# Patient Record
Sex: Male | Born: 1960 | Race: White | Hispanic: No | Marital: Single | Smoking: Never smoker
Health system: Southern US, Community
[De-identification: ages and names within clinical notes are randomized; demographics above are authoritative.]

## PROBLEM LIST (undated history)

## (undated) DIAGNOSIS — E669 Obesity, unspecified: Secondary | ICD-10-CM

## (undated) DIAGNOSIS — L509 Urticaria, unspecified: Secondary | ICD-10-CM

## (undated) DIAGNOSIS — M549 Dorsalgia, unspecified: Secondary | ICD-10-CM

## (undated) DIAGNOSIS — G4730 Sleep apnea, unspecified: Secondary | ICD-10-CM

## (undated) DIAGNOSIS — E119 Type 2 diabetes mellitus without complications: Secondary | ICD-10-CM

## (undated) DIAGNOSIS — N481 Balanitis: Secondary | ICD-10-CM

## (undated) DIAGNOSIS — E782 Mixed hyperlipidemia: Secondary | ICD-10-CM

## (undated) DIAGNOSIS — M109 Gout, unspecified: Secondary | ICD-10-CM

## (undated) DIAGNOSIS — I10 Essential (primary) hypertension: Secondary | ICD-10-CM

## (undated) DIAGNOSIS — I83891 Varicose veins of right lower extremities with other complications: Secondary | ICD-10-CM

## (undated) DIAGNOSIS — T7840XA Allergy, unspecified, initial encounter: Secondary | ICD-10-CM

## (undated) DIAGNOSIS — L0390 Cellulitis, unspecified: Secondary | ICD-10-CM

## (undated) DIAGNOSIS — M159 Polyosteoarthritis, unspecified: Secondary | ICD-10-CM

## (undated) DIAGNOSIS — F419 Anxiety disorder, unspecified: Secondary | ICD-10-CM

## (undated) DIAGNOSIS — K2100 Gastro-esophageal reflux disease with esophagitis, without bleeding: Secondary | ICD-10-CM

## (undated) DIAGNOSIS — J45909 Unspecified asthma, uncomplicated: Secondary | ICD-10-CM

## (undated) DIAGNOSIS — J309 Allergic rhinitis, unspecified: Secondary | ICD-10-CM

## (undated) DIAGNOSIS — L900 Lichen sclerosus et atrophicus: Secondary | ICD-10-CM

## (undated) DIAGNOSIS — J449 Chronic obstructive pulmonary disease, unspecified: Secondary | ICD-10-CM

## (undated) DIAGNOSIS — E662 Morbid (severe) obesity with alveolar hypoventilation: Secondary | ICD-10-CM

## (undated) DIAGNOSIS — E1165 Type 2 diabetes mellitus with hyperglycemia: Secondary | ICD-10-CM

## (undated) DIAGNOSIS — G629 Polyneuropathy, unspecified: Secondary | ICD-10-CM

## (undated) HISTORY — DX: Gastro-esophageal reflux disease with esophagitis, without bleeding: K21.00

## (undated) HISTORY — DX: Allergic rhinitis, unspecified: J30.9

## (undated) HISTORY — PX: CHOLECYSTECTOMY: SHX55

## (undated) HISTORY — DX: Allergy, unspecified, initial encounter: T78.40XA

## (undated) HISTORY — DX: Morbid (severe) obesity due to excess calories: E66.01

## (undated) HISTORY — DX: Gout, unspecified: M10.9

## (undated) HISTORY — DX: Polyosteoarthritis, unspecified: M15.9

## (undated) HISTORY — DX: Essential (primary) hypertension: I10

## (undated) HISTORY — DX: Morbid (severe) obesity with alveolar hypoventilation: E66.2

## (undated) HISTORY — DX: Anxiety disorder, unspecified: F41.9

## (undated) HISTORY — DX: Dorsalgia, unspecified: M54.9

## (undated) HISTORY — DX: Type 2 diabetes mellitus with hyperglycemia: E11.65

## (undated) HISTORY — DX: 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: I83.891

## (undated) HISTORY — DX: Sleep apnea, unspecified: G47.30

## (undated) HISTORY — DX: Urticaria, unspecified: L50.9

## (undated) HISTORY — DX: Obesity, unspecified: E66.9

## (undated) HISTORY — DX: Mixed hyperlipidemia: E78.2

---

## 1996-01-04 HISTORY — PX: ERCP: SHX60

## 2010-10-19 ENCOUNTER — Ambulatory Visit: Payer: Medicaid Other | Attending: Pulmonary Disease | Admitting: Sleep Medicine

## 2010-10-19 DIAGNOSIS — Z6841 Body Mass Index (BMI) 40.0 and over, adult: Secondary | ICD-10-CM | POA: Insufficient documentation

## 2010-10-19 DIAGNOSIS — G4730 Sleep apnea, unspecified: Secondary | ICD-10-CM

## 2010-10-19 DIAGNOSIS — G4733 Obstructive sleep apnea (adult) (pediatric): Secondary | ICD-10-CM | POA: Insufficient documentation

## 2010-10-24 NOTE — Procedures (Signed)
NAME:  Aaron Potter, IIAMS              ACCOUNT NO.:  [id number]  MEDICAL RECORD NO.:  [id number]          PATIENT TYPE:  OUT  LOCATION:  SLEEP LAB                     FACILITY:  [facility]  PHYSICIAN:  Farrell Pantaleo A. Gerilyn Pilgrim, M.D. DATE OF BIRTH:  12/14/1960  DATE OF STUDY:  10/19/2010                           NOCTURNAL POLYSOMNOGRAM  REFERRING PHYSICIAN:  Ramon Dredge L. [name], M.D.  INDICATION:  [name] who presents with hypersomnia, fatigue, witnessed apnea and snoring.  This study is being done to evaluate for obstructive sleep apnea syndrome.  MEDICATIONS:  Albuterol, pravastatin, Ventolin, metformin, alprazolam, allopurinol, indomethacin, verapamil and glipizide.  EPWORTH SLEEPINESS SCALE: 1. BMI is 47.  ARCHITECTURAL SUMMARY:  This is a split night recording with initial portion being a diagnostic and the second a titration recording.  The total recording time is 470 minutes.  Sleep efficiency 83%.  Sleep latency 50 minutes. REM latency 303 minutes.  RESPIRATORY SUMMARY:  Baseline oxygen saturation is 94, lowest saturation 81 during non REM sleep.  Diagnostic AHI is 133. The patient was placed on positive pressure between 3 and 15, optimal pressure is 15 with resolution of obstructive events and good tolerance.  LIMB MOVEMENT SUMMARY:  PLM index 0.  ELECTROCARDIOGRAM SUMMARY:  Average heart rate is 71 with no significant dysrhythmias observed.  IMPRESSION:  Severe obstructive sleep apnea syndrome, which responds well to a CPAP of 15.  Thank you for this referral.    Staci Carver A. Gerilyn Pilgrim, M.D.    KAD/MEDQ  D:  10/23/2010 20:43:09  T:  10/24/2010 02:49:31  Job:  [id number]

## 2013-04-06 ENCOUNTER — Encounter (HOSPITAL_COMMUNITY): Payer: Self-pay | Admitting: Emergency Medicine

## 2013-04-06 ENCOUNTER — Inpatient Hospital Stay (HOSPITAL_COMMUNITY)
Admission: EM | Admit: 2013-04-06 | Discharge: 2013-04-12 | DRG: 603 | Disposition: A | Discharge status: Home / Self Care | Payer: Medicaid Other | Attending: Internal Medicine | Admitting: Internal Medicine

## 2013-04-06 DIAGNOSIS — I10 Essential (primary) hypertension: Secondary | ICD-10-CM | POA: Diagnosis present

## 2013-04-06 DIAGNOSIS — L03319 Cellulitis of trunk, unspecified: Principal | ICD-10-CM

## 2013-04-06 DIAGNOSIS — IMO0002 Reserved for concepts with insufficient information to code with codable children: Secondary | ICD-10-CM | POA: Diagnosis present

## 2013-04-06 DIAGNOSIS — Z79899 Other long term (current) drug therapy: Secondary | ICD-10-CM

## 2013-04-06 DIAGNOSIS — Q5564 Hidden penis: Secondary | ICD-10-CM

## 2013-04-06 DIAGNOSIS — E1165 Type 2 diabetes mellitus with hyperglycemia: Secondary | ICD-10-CM

## 2013-04-06 DIAGNOSIS — N480 Leukoplakia of penis: Secondary | ICD-10-CM | POA: Diagnosis present

## 2013-04-06 DIAGNOSIS — L0291 Cutaneous abscess, unspecified: Secondary | ICD-10-CM

## 2013-04-06 DIAGNOSIS — N476 Balanoposthitis: Secondary | ICD-10-CM | POA: Diagnosis present

## 2013-04-06 DIAGNOSIS — L0390 Cellulitis, unspecified: Secondary | ICD-10-CM | POA: Diagnosis present

## 2013-04-06 DIAGNOSIS — L02219 Cutaneous abscess of trunk, unspecified: Principal | ICD-10-CM | POA: Diagnosis present

## 2013-04-06 DIAGNOSIS — M109 Gout, unspecified: Secondary | ICD-10-CM | POA: Diagnosis present

## 2013-04-06 DIAGNOSIS — J4489 Other specified chronic obstructive pulmonary disease: Secondary | ICD-10-CM | POA: Diagnosis present

## 2013-04-06 DIAGNOSIS — N481 Balanitis: Secondary | ICD-10-CM | POA: Diagnosis present

## 2013-04-06 DIAGNOSIS — G4733 Obstructive sleep apnea (adult) (pediatric): Secondary | ICD-10-CM | POA: Diagnosis present

## 2013-04-06 DIAGNOSIS — E1151 Type 2 diabetes mellitus with diabetic peripheral angiopathy without gangrene: Secondary | ICD-10-CM | POA: Diagnosis present

## 2013-04-06 DIAGNOSIS — Z6841 Body Mass Index (BMI) 40.0 and over, adult: Secondary | ICD-10-CM

## 2013-04-06 DIAGNOSIS — Z794 Long term (current) use of insulin: Secondary | ICD-10-CM

## 2013-04-06 DIAGNOSIS — J449 Chronic obstructive pulmonary disease, unspecified: Secondary | ICD-10-CM | POA: Diagnosis present

## 2013-04-06 DIAGNOSIS — E119 Type 2 diabetes mellitus without complications: Secondary | ICD-10-CM | POA: Diagnosis present

## 2013-04-06 HISTORY — DX: Unspecified asthma, uncomplicated: J45.909

## 2013-04-06 HISTORY — DX: Essential (primary) hypertension: I10

## 2013-04-06 HISTORY — DX: Type 2 diabetes mellitus without complications: E11.9

## 2013-04-06 HISTORY — DX: Chronic obstructive pulmonary disease, unspecified: J44.9

## 2013-04-06 HISTORY — DX: Sleep apnea, unspecified: G47.30

## 2013-04-06 HISTORY — DX: Gout, unspecified: M10.9

## 2013-04-06 LAB — BASIC METABOLIC PANEL
BUN: 12 mg/dL (ref 6–23)
CALCIUM: 9.5 mg/dL (ref 8.4–10.5)
CO2: 23 meq/L (ref 19–32)
CREATININE: 0.81 mg/dL (ref 0.50–1.35)
Chloride: 97 mEq/L (ref 96–112)
Glucose, Bld: 217 mg/dL — ABNORMAL HIGH (ref 70–99)
Potassium: 4.2 mEq/L (ref 3.7–5.3)
SODIUM: 136 meq/L — AB (ref 137–147)

## 2013-04-06 LAB — CBC WITH DIFFERENTIAL/PLATELET
BASOS ABS: 0 10*3/uL (ref 0.0–0.1)
BASOS PCT: 0 % (ref 0–1)
EOS ABS: 0.2 10*3/uL (ref 0.0–0.7)
EOS PCT: 3 % (ref 0–5)
HEMATOCRIT: 45.5 % (ref 39.0–52.0)
Hemoglobin: 15.8 g/dL (ref 13.0–17.0)
Lymphocytes Relative: 27 % (ref 12–46)
Lymphs Abs: 2.3 10*3/uL (ref 0.7–4.0)
MCH: 32.1 pg (ref 26.0–34.0)
MCHC: 34.7 g/dL (ref 30.0–36.0)
MCV: 92.5 fL (ref 78.0–100.0)
MONO ABS: 0.8 10*3/uL (ref 0.1–1.0)
Monocytes Relative: 10 % (ref 3–12)
Neutro Abs: 5.1 10*3/uL (ref 1.7–7.7)
Neutrophils Relative %: 60 % (ref 43–77)
PLATELETS: 285 10*3/uL (ref 150–400)
RBC: 4.92 MIL/uL (ref 4.22–5.81)
RDW: 13.7 % (ref 11.5–15.5)
WBC: 8.4 10*3/uL (ref 4.0–10.5)

## 2013-04-06 LAB — URINALYSIS, ROUTINE W REFLEX MICROSCOPIC
Bilirubin Urine: NEGATIVE
Glucose, UA: >1000 mg/dL — AB
Ketones, ur: 15 mg/dL — AB
LEUKOCYTES UA: NEGATIVE
NITRITE: NEGATIVE
PH: 5.5 (ref 5.0–8.0)
PROTEIN: NEGATIVE mg/dL
Specific Gravity, Urine: 1.025 (ref 1.005–1.030)
Urobilinogen, UA: 0.2 mg/dL (ref 0.0–1.0)

## 2013-04-06 LAB — URINE MICROSCOPIC-ADD ON

## 2013-04-06 LAB — HEMOGLOBIN A1C
Hgb A1c MFr Bld: 7.9 % — ABNORMAL HIGH (ref <5.7)
MEAN PLASMA GLUCOSE: 180 mg/dL — AB (ref <117)

## 2013-04-06 LAB — GLUCOSE, CAPILLARY: Glucose-Capillary: 167 mg/dL — ABNORMAL HIGH (ref 70–99)

## 2013-04-06 LAB — CBG MONITORING, ED: Glucose-Capillary: 210 mg/dL — ABNORMAL HIGH (ref 70–99)

## 2013-04-06 MED ORDER — ENOXAPARIN SODIUM 80 MG/0.8ML ~~LOC~~ SOLN
80.0000 mg | SUBCUTANEOUS | Status: DC
  Administered 2013-04-06 – 2013-04-11 (×6): 80 mg via SUBCUTANEOUS
  Filled 2013-04-06 (×7): qty 0.8

## 2013-04-06 MED ORDER — INDOMETHACIN 50 MG PO CAPS
50.0000 mg | ORAL_CAPSULE | Freq: Every day | ORAL | Status: DC
  Administered 2013-04-07 – 2013-04-12 (×6): 50 mg via ORAL
  Filled 2013-04-06 (×2): qty 2
  Filled 2013-04-06: qty 1
  Filled 2013-04-06: qty 2
  Filled 2013-04-06 (×2): qty 1
  Filled 2013-04-06: qty 2

## 2013-04-06 MED ORDER — ALBUTEROL SULFATE HFA 108 (90 BASE) MCG/ACT IN AERS
2.0000 | INHALATION_SPRAY | Freq: Two times a day (BID) | RESPIRATORY_TRACT | Status: DC | PRN
  Filled 2013-04-06: qty 6.7

## 2013-04-06 MED ORDER — ONDANSETRON HCL 4 MG PO TABS: 4.0000 mg | ORAL_TABLET | Freq: Four times a day (QID) | ORAL | Status: DC | PRN

## 2013-04-06 MED ORDER — ALPRAZOLAM 1 MG PO TABS
1.0000 mg | ORAL_TABLET | Freq: Four times a day (QID) | ORAL | Status: DC
  Administered 2013-04-06 – 2013-04-12 (×23): 1 mg via ORAL
  Filled 2013-04-06 (×24): qty 1

## 2013-04-06 MED ORDER — HYDROCODONE-ACETAMINOPHEN 5-325 MG PO TABS
1.0000 | ORAL_TABLET | ORAL | Status: DC | PRN
  Administered 2013-04-06: 1 via ORAL
  Filled 2013-04-06: qty 1
  Filled 2013-04-06: qty 2

## 2013-04-06 MED ORDER — ONDANSETRON HCL 4 MG/2ML IJ SOLN: 4.0000 mg | Freq: Four times a day (QID) | INTRAMUSCULAR | Status: DC | PRN

## 2013-04-06 MED ORDER — INDOMETHACIN 25 MG PO CAPS
50.0000 mg | ORAL_CAPSULE | ORAL | Status: DC
  Filled 2013-04-06: qty 2

## 2013-04-06 MED ORDER — ZOLPIDEM TARTRATE 10 MG PO TABS
10.0000 mg | ORAL_TABLET | Freq: Every evening | ORAL | Status: DC | PRN
  Administered 2013-04-09 – 2013-04-10 (×2): 10 mg via ORAL
  Filled 2013-04-06: qty 1
  Filled 2013-04-06 (×2): qty 2

## 2013-04-06 MED ORDER — BENAZEPRIL HCL 5 MG PO TABS
5.0000 mg | ORAL_TABLET | Freq: Every day | ORAL | Status: DC
  Administered 2013-04-07 – 2013-04-12 (×6): 5 mg via ORAL
  Filled 2013-04-06 (×6): qty 1

## 2013-04-06 MED ORDER — METFORMIN HCL 500 MG PO TABS
1000.0000 mg | ORAL_TABLET | Freq: Two times a day (BID) | ORAL | Status: DC
  Administered 2013-04-06 – 2013-04-12 (×12): 1000 mg via ORAL
  Filled 2013-04-06 (×14): qty 2

## 2013-04-06 MED ORDER — HYDROCODONE-ACETAMINOPHEN 10-325 MG PO TABS
1.0000 | ORAL_TABLET | Freq: Four times a day (QID) | ORAL | Status: DC
Start: 2013-04-06 — End: 2013-04-12
  Administered 2013-04-06 – 2013-04-12 (×23): 1 via ORAL
  Filled 2013-04-06 (×23): qty 1

## 2013-04-06 MED ORDER — VANCOMYCIN HCL 10 G IV SOLR
1500.0000 mg | Freq: Two times a day (BID) | INTRAVENOUS | Status: DC
  Administered 2013-04-07 – 2013-04-11 (×9): 1500 mg via INTRAVENOUS
  Filled 2013-04-06 (×11): qty 1500

## 2013-04-06 MED ORDER — INSULIN ASPART 100 UNIT/ML ~~LOC~~ SOLN
0.0000 [IU] | Freq: Three times a day (TID) | SUBCUTANEOUS | Status: DC
  Administered 2013-04-06: 2 [IU] via SUBCUTANEOUS
  Administered 2013-04-07: 1 [IU] via SUBCUTANEOUS
  Administered 2013-04-07: 2 [IU] via SUBCUTANEOUS
  Administered 2013-04-07 – 2013-04-08 (×2): 3 [IU] via SUBCUTANEOUS
  Administered 2013-04-08: 2 [IU] via SUBCUTANEOUS
  Administered 2013-04-08: 1 [IU] via SUBCUTANEOUS

## 2013-04-06 MED ORDER — ENOXAPARIN SODIUM 40 MG/0.4ML ~~LOC~~ SOLN
40.0000 mg | SUBCUTANEOUS | Status: DC
  Filled 2013-04-06: qty 0.4

## 2013-04-06 MED ORDER — ADULT MULTIVITAMIN W/MINERALS CH
1.0000 | ORAL_TABLET | Freq: Every day | ORAL | Status: DC
  Administered 2013-04-07 – 2013-04-12 (×6): 1 via ORAL
  Filled 2013-04-06 (×7): qty 1

## 2013-04-06 MED ORDER — VERAPAMIL HCL ER 240 MG PO TBCR
240.0000 mg | EXTENDED_RELEASE_TABLET | Freq: Every day | ORAL | Status: DC
  Administered 2013-04-07 – 2013-04-12 (×6): 240 mg via ORAL
  Filled 2013-04-06 (×6): qty 1

## 2013-04-06 MED ORDER — ALLOPURINOL 300 MG PO TABS
300.0000 mg | ORAL_TABLET | Freq: Every day | ORAL | Status: DC
  Administered 2013-04-07 – 2013-04-12 (×6): 300 mg via ORAL
  Filled 2013-04-06 (×6): qty 1

## 2013-04-06 MED ORDER — INSULIN GLARGINE 100 UNIT/ML ~~LOC~~ SOLN
70.0000 [IU] | Freq: Every day | SUBCUTANEOUS | Status: DC
  Administered 2013-04-06 – 2013-04-11 (×6): 70 [IU] via SUBCUTANEOUS
  Filled 2013-04-06 (×8): qty 0.7

## 2013-04-06 MED ORDER — INSULIN ASPART 100 UNIT/ML ~~LOC~~ SOLN
3.0000 [IU] | Freq: Three times a day (TID) | SUBCUTANEOUS | Status: DC
  Administered 2013-04-06 – 2013-04-08 (×7): 3 [IU] via SUBCUTANEOUS

## 2013-04-06 MED ORDER — MORPHINE SULFATE 2 MG/ML IJ SOLN
1.0000 mg | INTRAMUSCULAR | Status: DC | PRN
  Administered 2013-04-07 – 2013-04-09 (×3): 1 mg via INTRAVENOUS
  Filled 2013-04-06 (×3): qty 1

## 2013-04-06 MED ORDER — SIMVASTATIN 10 MG PO TABS
5.0000 mg | ORAL_TABLET | Freq: Every day | ORAL | Status: DC
  Administered 2013-04-06 – 2013-04-11 (×6): 5 mg via ORAL
  Filled 2013-04-06 (×2): qty 1
  Filled 2013-04-06 (×2): qty 0.5
  Filled 2013-04-06 (×4): qty 1
  Filled 2013-04-06: qty 0.5

## 2013-04-06 MED ORDER — INSULIN ASPART 100 UNIT/ML ~~LOC~~ SOLN: 0.0000 [IU] | Freq: Every day | SUBCUTANEOUS | Status: DC

## 2013-04-06 MED ORDER — ALBUTEROL SULFATE (2.5 MG/3ML) 0.083% IN NEBU
2.5000 mg | INHALATION_SOLUTION | Freq: Four times a day (QID) | RESPIRATORY_TRACT | Status: DC | PRN
  Administered 2013-04-08 – 2013-04-11 (×4): 2.5 mg via RESPIRATORY_TRACT
  Filled 2013-04-06 (×4): qty 3

## 2013-04-06 MED ORDER — PIPERACILLIN-TAZOBACTAM 3.375 G IVPB
3.3750 g | Freq: Three times a day (TID) | INTRAVENOUS | Status: DC
  Administered 2013-04-06 – 2013-04-09 (×10): 3.375 g via INTRAVENOUS
  Filled 2013-04-06 (×14): qty 50

## 2013-04-06 MED ORDER — TIOTROPIUM BROMIDE MONOHYDRATE 18 MCG IN CAPS
18.0000 ug | ORAL_CAPSULE | Freq: Every day | RESPIRATORY_TRACT | Status: DC
  Administered 2013-04-06 – 2013-04-12 (×5): 18 ug via RESPIRATORY_TRACT
  Filled 2013-04-06 (×2): qty 5

## 2013-04-06 MED ORDER — INSULIN ASPART 100 UNIT/ML FLEXPEN: 10.0000 [IU] | PEN_INJECTOR | Freq: Three times a day (TID) | SUBCUTANEOUS | Status: DC

## 2013-04-06 MED ORDER — LINAGLIPTIN 5 MG PO TABS
5.0000 mg | ORAL_TABLET | Freq: Every day | ORAL | Status: DC
  Administered 2013-04-07 – 2013-04-12 (×6): 5 mg via ORAL
  Filled 2013-04-06 (×6): qty 1

## 2013-04-06 MED ORDER — VANCOMYCIN HCL 10 G IV SOLR
2000.0000 mg | Freq: Once | INTRAVENOUS | Status: AC
  Administered 2013-04-06: 2000 mg via INTRAVENOUS
  Filled 2013-04-06: qty 2000

## 2013-04-06 NOTE — ED Notes (Signed)
Dr Pickering at bedside,  

## 2013-04-06 NOTE — ED Notes (Signed)
Pt sitting up on side of bed, states "I am comfortable in this position", pt updated on plan of care and admission to hospital

## 2013-04-06 NOTE — ED Notes (Addendum)
Pt c/o pain with sitting on stretcher, offer of pain medication made to pt with pt refusing pain medication at present time, RN made several offer of pain medication, each time pt refusing, states "I am hurting from sitting on stretcher" pt repositioned, comfort measures provided,

## 2013-04-06 NOTE — ED Notes (Signed)
Pt c/o rash, swelling, pain to penis area that started about a week ago, was seen by Dr Adah PerlHawkin's on Wednesday, given pain medication, and antibiotics, pt states that he is not any better, skin around penis, red, warm to touch, swollen, skin in pubic ramus area red, warm to touch, pt has yellow drainage noted from penis area. States that he is unable to pull the foreskin back to see the penis,

## 2013-04-06 NOTE — ED Provider Notes (Signed)
CSN: [id number]     Arrival date & time 04/06/13  4098 History  This chart was scribed for non-physician practitioner working with Dorothea Ogle, MD by Ashley Jacobs, ED scribe. This patient was seen in room [facility]08/[facility]08 and the patient's care was started at 9:02 AM.    First MD Initiated Contact with Patient 04/06/13 [phone]     Chief Complaint  Patient presents with  . Rash     (Consider location/radiation/quality/duration/timing/severity/associated sxs/prior Treatment) Patient is a 53 y.o. male presenting with rash. The history is provided by the patient and medical records. No language interpreter was used.  Rash Associated symptoms: wheezing   Associated symptoms: no fever    HPI Comments: Aaron Potter is a 53 y.o. male who presents to the Emergency Department complaining of rash to his foreskin onset one week ago. He mentions that his symptoms remains unresolved and persistent. Nothing seems to resolve his symptoms.  Pt is uncircumcised. He reports having burning with urination and having difficulty retracting the foreskin due to swelling. He experiences mild relief when showering. Pt has the associated symptom of diaphoresis and chills. He mentions that his glucose levels are regular. Pt did not eat this morning. He has a hx of COPD and reports that the wheezing is baseline for him.  Pt has allergies to Biaxin which causes anaphylaxis.   Past Medical History  Diagnosis Date  . Diabetes mellitus without complication   . Asthma   . COPD (chronic obstructive pulmonary disease)   . Hypertension   . Gout    Past Surgical History  Procedure Laterality Date  . Cholecystectomy     History reviewed. No pertinent family history. History  Substance Use Topics  . Smoking status: Never Smoker   . Smokeless tobacco: Not on file  . Alcohol Use: No    Review of Systems  Constitutional: Positive for chills and diaphoresis. Negative for fever.  Respiratory: Positive for wheezing.    Genitourinary: Positive for dysuria, penile swelling and penile pain. Negative for discharge.  Skin: Positive for rash.  All other systems reviewed and are negative.      Allergies  Biaxin  Home Medications   Current Outpatient Rx  Name  Route  Sig  Dispense  Refill  . albuterol (PROVENTIL HFA;VENTOLIN HFA) 108 (90 BASE) MCG/ACT inhaler   Inhalation   Inhale 2 puffs into the lungs 2 (two) times daily as needed for wheezing or shortness of breath.         Marland Kitchen albuterol (PROVENTIL) (2.5 MG/3ML) 0.083% nebulizer solution   Nebulization   Take 2.5 mg by nebulization every 6 (six) hours as needed for wheezing or shortness of breath.          . allopurinol (ZYLOPRIM) 300 MG tablet   Oral   Take 300 mg by mouth daily.           Marland Kitchen ALPRAZolam (XANAX) 1 MG tablet   Oral   Take 1 mg by mouth 4 (four) times daily.           . benazepril (LOTENSIN) 5 MG tablet   Oral   Take 5 mg by mouth daily.         . cephALEXin (KEFLEX) 250 MG capsule   Oral   Take 500 mg by mouth 3 (three) times daily.         Marland Kitchen HYDROcodone-acetaminophen (NORCO) 10-325 MG per tablet   Oral   Take 1 tablet by mouth 4 (four) times daily.         Marland Kitchen  indomethacin (INDOCIN) 50 MG capsule   Oral   Take 50 mg by mouth 1 day or 1 dose.           . insulin aspart (NOVOLOG FLEXPEN) 100 UNIT/ML FlexPen   Subcutaneous   Inject 10-14 Units into the skin 3 (three) times daily with meals.         . insulin glargine (LANTUS) 100 UNIT/ML injection   Subcutaneous   Inject 70 Units into the skin at bedtime.         . Liraglutide (VICTOZA) 18 MG/3ML SOPN   Subcutaneous   Inject 1.8 Units into the skin daily.         . metFORMIN (GLUCOPHAGE) 500 MG tablet   Oral   Take 1,000 mg by mouth 2 (two) times daily with a meal.          . Multiple Vitamin (MULTIVITAMIN WITH MINERALS) TABS tablet   Oral   Take 1 tablet by mouth daily.         . pravastatin (PRAVACHOL) 40 MG tablet   Oral   Take  40 mg by mouth daily.         . saxagliptin HCl (ONGLYZA) 5 MG TABS tablet   Oral   Take 5 mg by mouth daily.         Marland Kitchen tiotropium (SPIRIVA) 18 MCG inhalation capsule   Inhalation   Place 18 mcg into inhaler and inhale daily.         . verapamil (CALAN-SR) 240 MG CR tablet   Oral   Take 240 mg by mouth daily.         . Vitamin D, Ergocalciferol, (DRISDOL) 50000 UNITS CAPS capsule   Oral   Take 50,000 Units by mouth every 7 (seven) days. On Tuesday's.         . zolpidem (AMBIEN) 10 MG tablet   Oral   Take 10 mg by mouth at bedtime as needed for sleep.           BP 140/70  Pulse 79  Temp(Src) 97.8 F (36.6 C) (Oral)  Resp 22  Ht 6' (1.829 m)  Wt 401 lb (181.892 kg)  BMI 54.37 kg/m2  SpO2 96% Physical Exam  Nursing note and vitals reviewed. Constitutional: He is oriented to person, place, and time. He appears well-developed. No distress.  Obese   HENT:  Head: Normocephalic and atraumatic.  Eyes: EOM are normal. Pupils are equal, round, and reactive to light.  Neck: Normal range of motion. Neck supple. No tracheal deviation present.  Cardiovascular: Normal rate.   Pulmonary/Chest: Tachypnea noted. No respiratory distress. He has wheezes (mild diffuse).  Abdominal: Soft. He exhibits no distension. There is no tenderness.  Genitourinary: Uncircumcised.   uncircumsized Diffuse edema to the foreskin  No purulent drainage Not able to visualize penis No surrounding skin changes to the abdomen or thig  Musculoskeletal: Normal range of motion. He exhibits no edema.  No peripheral edema   Neurological: He is alert and oriented to person, place, and time.  Skin: Skin is warm and dry.  Psychiatric: He has a normal mood and affect. His behavior is normal.    ED Course  Procedures (including critical care time) DIAGNOSTIC STUDIES: Oxygen Saturation is 96% on room air, normal by my interpretation.    COORDINATION OF CARE:  9:05 AM Discussed course of care  with pt which includes IV medication and laboratory tests. Pt understands and agrees.    Labs Review Labs Reviewed  BASIC METABOLIC PANEL - Abnormal; Notable for the following:    Sodium 136 (*)    Glucose, Bld 217 (*)    All other components within normal limits  CBG MONITORING, ED - Abnormal; Notable for the following:    Glucose-Capillary 210 (*)    All other components within normal limits  WOUND CULTURE  CBC WITH DIFFERENTIAL  CBG MONITORING, ED   Imaging Review No results found.   EKG Interpretation None      MDM   Final diagnoses:  Cellulitis    Patient with foreskin cellulitis. No clear abscess. Has been on antibiotics as an outpatient for 3 days. Worsening of symptoms. Will admit for inpatient management due to multiple comorbidities and failure of outpatient antibiotics.   I personally performed the services described in this documentation, which was scribed in my presence. The recorded information has been reviewed and is accurate.     Juliet RudeNathan R. Rubin PayorPickering, MD 04/06/13 1059

## 2013-04-06 NOTE — ED Notes (Signed)
Pt c/o rash-like area to penis. Pt noticed area one week ago. Pt reports pain in area. Pt was seen by Dr Juanetta GoslingHawkins on Wednesday and "had a shot" and was given abx rx for home. Pt states symptoms are not improving with medication.

## 2013-04-06 NOTE — ED Notes (Signed)
Pt updated on plan of care, denies needing anything for pain,

## 2013-04-06 NOTE — H&P (Signed)
[facility] History and Physical  Aaron Potter ZOX:[id number] DOB: 04/23/1960 DOA: 04/06/2013  Referring physician: ED physician PCP: Fredirick Maudlin, MD   Chief Complaint: foreskin rash   HPI:  Pt is 53 yo male with HTN, DM, COPD, presented to [facility] ED with main concern of several days duration of progressively worsening foreskin rash. He reports associated burning with urination and difficulty retracting foreskin, progressive swelling and redness, subjective fevers, chills. He has seen his PCP several days ago and was given ABX shot, pt does not know the name of the ABX. His symptoms have not improved and he came to ED. He denies chest pain, shortness of breath, no specific abdominal concerns. He also explains his sugar levels have been fairly well controlled on current regimen.  Assessment and Plan: Active Problems: Foreskin infection - will admit pt to medical floor and will place on slightly broader spectrum for now, given the fact that pt is diabetic and has not responded as well to oral ABX - place on Vanc and Maxipime for now and will likely be able to transition to oral ABX in AM and possible d/c depending on how pt is doing  - provide analgesia as needed - check UA - will notify PCP Dr. [name] of pt's admission DM - continue home medical regimen - pt can eat regular diet as able to tolerate  HTN - reasonably stable in admission - continue home medical regimen COPD - clinically compensated at this time - pt is maintaining oxygen saturation at target range   Radiological Exams on Admission: No results found.  Code Status: Full Family Communication: Pt at bedside Disposition Plan: Admit for further evaluation     Review of Systems:  Constitutional: Negative for diaphoresis.  HENT: Negative for hearing loss, ear pain, nosebleeds, congestion, sore throat, neck pain, tinnitus and ear discharge.   Eyes: Negative for blurred vision, double vision, photophobia,  pain, discharge and redness.  Respiratory: Negative for cough, hemoptysis, sputum production, shortness of breath, wheezing and stridor.   Cardiovascular: Negative for chest pain, palpitations, orthopnea, claudication and leg swelling.  Gastrointestinal: Negative for heartburn, constipation, blood in stool and melena.  Genitourinary: Negative for hematuria and flank pain.  Musculoskeletal: Negative for myalgias, back pain, joint pain and falls.  Skin: Per HPI Neurological: Negative for tingling, tremors, sensory change, speech change, focal weakness, loss of consciousness and headaches.  Endo/Heme/Allergies: Negative for environmental allergies and polydipsia. Does not bruise/bleed easily.  Psychiatric/Behavioral: Negative for suicidal ideas. The patient is not nervous/anxious.      Past Medical History  Diagnosis Date  . Diabetes mellitus without complication   . Asthma   . COPD (chronic obstructive pulmonary disease)   . Hypertension   . Gout     Past Surgical History  Procedure Laterality Date  . Cholecystectomy      Social History:  reports that he has never smoked. He does not have any smokeless tobacco history on file. He reports that he does not drink alcohol or use illicit drugs.  Allergies  Allergen Reactions  . Biaxin [Clarithromycin] Shortness Of Breath    No known significant family medical history   Prior to Admission medications   Medication Sig Start Date End Date Taking? Authorizing Provider  albuterol (PROVENTIL HFA;VENTOLIN HFA) 108 (90 BASE) MCG/ACT inhaler Inhale 2 puffs into the lungs 2 (two) times daily as needed for wheezing or shortness of breath.   Yes Historical Provider, MD  albuterol (PROVENTIL) (2.5 MG/3ML) 0.083% nebulizer solution Take  2.5 mg by nebulization every 6 (six) hours as needed for wheezing or shortness of breath.    Yes Historical Provider, MD  allopurinol (ZYLOPRIM) 300 MG tablet Take 300 mg by mouth daily.     Yes Historical Provider,  MD  ALPRAZolam Prudy Feeler(XANAX) 1 MG tablet Take 1 mg by mouth 4 (four) times daily.     Yes Historical Provider, MD  benazepril (LOTENSIN) 5 MG tablet Take 5 mg by mouth daily.   Yes Historical Provider, MD  cephALEXin (KEFLEX) 250 MG capsule Take 500 mg by mouth 3 (three) times daily.   Yes Historical Provider, MD  HYDROcodone-acetaminophen (NORCO) 10-325 MG per tablet Take 1 tablet by mouth 4 (four) times daily.   Yes Historical Provider, MD  indomethacin (INDOCIN) 50 MG capsule Take 50 mg by mouth 1 day or 1 dose.     Yes Historical Provider, MD  insulin aspart (NOVOLOG FLEXPEN) 100 UNIT/ML FlexPen Inject 10-14 Units into the skin 3 (three) times daily with meals.   Yes Historical Provider, MD  insulin glargine (LANTUS) 100 UNIT/ML injection Inject 70 Units into the skin at bedtime.   Yes Historical Provider, MD  Liraglutide (VICTOZA) 18 MG/3ML SOPN Inject 1.8 Units into the skin daily.   Yes Historical Provider, MD  metFORMIN (GLUCOPHAGE) 500 MG tablet Take 1,000 mg by mouth 2 (two) times daily with a meal.    Yes Historical Provider, MD  Multiple Vitamin (MULTIVITAMIN WITH MINERALS) TABS tablet Take 1 tablet by mouth daily.   Yes Historical Provider, MD  pravastatin (PRAVACHOL) 40 MG tablet Take 40 mg by mouth daily.   Yes Historical Provider, MD  saxagliptin HCl (ONGLYZA) 5 MG TABS tablet Take 5 mg by mouth daily.   Yes Historical Provider, MD  tiotropium (SPIRIVA) 18 MCG inhalation capsule Place 18 mcg into inhaler and inhale daily.   Yes Historical Provider, MD  verapamil (CALAN-SR) 240 MG CR tablet Take 240 mg by mouth daily.   Yes Historical Provider, MD  Vitamin D, Ergocalciferol, (DRISDOL) 50000 UNITS CAPS capsule Take 50,000 Units by mouth every 7 (seven) days. On Tuesday's.   Yes Historical Provider, MD  zolpidem (AMBIEN) 10 MG tablet Take 10 mg by mouth at bedtime as needed for sleep.    Yes Historical Provider, MD    Physical Exam: Filed Vitals:   04/06/13 0839  BP: 153/79  Pulse: 91   Temp: 97.8 F (36.6 C)  TempSrc: Oral  Resp: 22  Height: 6' (1.829 m)  Weight: 181.892 kg (401 lb)  SpO2: 96%    Physical Exam  Constitutional: Appears well-developed and well-nourished. No distress.  HENT: Normocephalic. External right and left ear normal. Oropharynx is clear and moist.  Eyes: Conjunctivae and EOM are normal. PERRLA, no scleral icterus.  Neck: Normal ROM. Neck supple. No JVD. No tracheal deviation. No thyromegaly.  CVS: RRR, S1/S2 +, no murmurs, no gallops, no carotid bruit.  Pulmonary: Effort and breath sounds normal, no stridor, rhonchi, wheezes, rales.  Abdominal: Soft. BS +,  no distension, tenderness, rebound or guarding.  Musculoskeletal: Normal range of motion. No edema and no tenderness.  Lymphadenopathy: No lymphadenopathy noted, cervical, inguinal. Neuro: Alert. Normal reflexes, muscle tone coordination. No cranial nerve deficit. Skin: skin around penis with erythema and warm to touch, swollen, skin in pubic ramus erythematous and also warm to touch, with some yellow drainage noted from penis Psychiatric: Normal mood and affect. Behavior, judgment, thought content normal.   Labs on Admission:  Basic Metabolic Panel:  Recent  Labs Lab 04/06/13 0919  NA 136*  K 4.2  CL 97  CO2 23  GLUCOSE 217*  BUN 12  CREATININE 0.81  CALCIUM 9.5   CBC:  Recent Labs Lab 04/06/13 0919  WBC 8.4  NEUTROABS 5.1  HGB 15.8  HCT 45.5  MCV 92.5  PLT 285   CBG:  Recent Labs Lab 04/06/13 0843  GLUCAP 210*    EKG: Normal sinus rhythm, no ST/T wave changes  Debbora Presto, MD  [facility] Pager [phone]  If 7PM-7AM, please contact night-coverage [email or web address] Password [facility] 04/06/2013, 10:49 AM

## 2013-04-06 NOTE — Progress Notes (Signed)
ANTIBIOTIC CONSULT NOTE - INITIAL  Pharmacy Consult for Vancomycin & Zosyn Indication: cellulitis  Allergies  Allergen Reactions  . Biaxin [Clarithromycin] Shortness Of Breath    Patient Measurements: Height: 6' (182.9 cm) Weight: 401 lb 3.2 oz (181.983 kg) IBW/kg (Calculated) : 77.6  Vital Signs: Temp: 97.3 F (36.3 C) (04/04 1303) Temp src: Oral (04/04 1303) BP: 125/62 mmHg (04/04 1303) Pulse Rate: 79 (04/04 1303) Intake/Output from previous day:   Intake/Output from this shift:    Labs:  Recent Labs  04/06/13 0919  WBC 8.4  HGB 15.8  PLT 285  CREATININE 0.81   Estimated Creatinine Clearance: 184.3 ml/min (by C-G formula based on Cr of 0.81). No results found for this basename: VANCOTROUGH, VANCOPEAK, VANCORANDOM, GENTTROUGH, GENTPEAK, GENTRANDOM, TOBRATROUGH, TOBRAPEAK, TOBRARND, AMIKACINPEAK, AMIKACINTROU, AMIKACIN,  in the last 72 hours   Microbiology: No results found for this or any previous visit (from the past 720 hour(s)).  Medical History: Past Medical History  Diagnosis Date  . Diabetes mellitus without complication   . Asthma   . COPD (chronic obstructive pulmonary disease)   . Hypertension   . Gout   . Sleep apnea     Medications:  Scheduled:  . [START ON 04/07/2013] allopurinol  300 mg Oral Daily  . ALPRAZolam  1 mg Oral QID  . [START ON 04/07/2013] benazepril  5 mg Oral Daily  . enoxaparin (LOVENOX) injection  80 mg Subcutaneous Q24H  . HYDROcodone-acetaminophen  1 tablet Oral QID  . [START ON 04/07/2013] indomethacin  50 mg Oral Q breakfast  . insulin aspart  0-5 Units Subcutaneous QHS  . insulin aspart  0-9 Units Subcutaneous TID WC  . insulin aspart  3 Units Subcutaneous TID WC  . insulin glargine  70 Units Subcutaneous QHS  . [START ON 04/07/2013] linagliptin  5 mg Oral Daily  . metFORMIN  1,000 mg Oral BID WC  . multivitamin with minerals  1 tablet Oral Daily  . piperacillin-tazobactam (ZOSYN)  IV  3.375 g Intravenous Q8H  .  simvastatin  5 mg Oral q1800  . tiotropium  18 mcg Inhalation Daily  . vancomycin  2,000 mg Intravenous Once  . [START ON 04/07/2013] verapamil  240 mg Oral Daily   Assessment: 53 yo obese M who presents with rash & swelling of penis region.  There is drainage per notes.  He has been on cephalexin as an outpatient with no improvement.   Renal function is at patient's baseline.   Vancomycin 4/4>> Zosyn 4/4>>  Goal of Therapy:  Vancomycin trough level 10-15 mcg/ml  Plan:  Zosyn 3.375gm IV Q8h to be infused over 4hrs Vancomycin 2gm IV x1 loading dose then 1500mg  IV q12h Check Vancomycin trough at steady state Monitor renal function and cx data   Elson ClanLilliston, Katryn Plummer Michelle 04/06/2013,1:44 PM

## 2013-04-06 NOTE — ED Notes (Signed)
Report given to Maralyn SagoSarah, RN on 300, pharmacy notified of vancomycin order,

## 2013-04-06 NOTE — ED Notes (Signed)
Dr Izola PriceMyers (hospitalist) at bedside,

## 2013-04-06 NOTE — Progress Notes (Signed)
Pt transferred to 300 from ED. Pt is in NAD and VS are stable. Will continue to monitor.

## 2013-04-07 DIAGNOSIS — Z794 Long term (current) use of insulin: Secondary | ICD-10-CM

## 2013-04-07 DIAGNOSIS — E1165 Type 2 diabetes mellitus with hyperglycemia: Secondary | ICD-10-CM

## 2013-04-07 DIAGNOSIS — I10 Essential (primary) hypertension: Secondary | ICD-10-CM | POA: Diagnosis present

## 2013-04-07 DIAGNOSIS — E1151 Type 2 diabetes mellitus with diabetic peripheral angiopathy without gangrene: Secondary | ICD-10-CM | POA: Diagnosis present

## 2013-04-07 DIAGNOSIS — IMO0002 Reserved for concepts with insufficient information to code with codable children: Secondary | ICD-10-CM | POA: Diagnosis present

## 2013-04-07 LAB — CBC
HCT: 42.7 % (ref 39.0–52.0)
Hemoglobin: 14.1 g/dL (ref 13.0–17.0)
MCH: 30.9 pg (ref 26.0–34.0)
MCHC: 33 g/dL (ref 30.0–36.0)
MCV: 93.6 fL (ref 78.0–100.0)
Platelets: 289 10*3/uL (ref 150–400)
RBC: 4.56 MIL/uL (ref 4.22–5.81)
RDW: 13.9 % (ref 11.5–15.5)
WBC: 7.4 10*3/uL (ref 4.0–10.5)

## 2013-04-07 LAB — BASIC METABOLIC PANEL
BUN: 12 mg/dL (ref 6–23)
CALCIUM: 8.8 mg/dL (ref 8.4–10.5)
CHLORIDE: 99 meq/L (ref 96–112)
CO2: 27 meq/L (ref 19–32)
Creatinine, Ser: 0.84 mg/dL (ref 0.50–1.35)
GFR calc non Af Amer: >90 mL/min (ref >90)
Glucose, Bld: 196 mg/dL — ABNORMAL HIGH (ref 70–99)
Potassium: 4.6 mEq/L (ref 3.7–5.3)
SODIUM: 136 meq/L — AB (ref 137–147)

## 2013-04-07 LAB — GLUCOSE, CAPILLARY
Glucose-Capillary: 152 mg/dL — ABNORMAL HIGH (ref 70–99)
Glucose-Capillary: 225 mg/dL — ABNORMAL HIGH (ref 70–99)
Glucose-Capillary: 165 mg/dL — ABNORMAL HIGH (ref 70–99)
Glucose-Capillary: 169 mg/dL — ABNORMAL HIGH (ref 70–99)

## 2013-04-07 MED ORDER — NYSTATIN-TRIAMCINOLONE 100000-0.1 UNIT/GM-% EX CREA
TOPICAL_CREAM | Freq: Three times a day (TID) | CUTANEOUS | Status: DC
  Administered 2013-04-07 – 2013-04-11 (×10): via TOPICAL
  Filled 2013-04-07 (×3): qty 15

## 2013-04-07 NOTE — Consult Note (Signed)
Note dictated [phone]#[id number]

## 2013-04-07 NOTE — Progress Notes (Signed)
Found a partially used syringe of dilaudid in pt's drawer in med room. Called pharmacy and [facility] the remaining [facility] 0.6mg  was wasted of a 1mg /411ml dilaudid syringe. Will continue to monitor.

## 2013-04-07 NOTE — Progress Notes (Signed)
Bladder scanned pt per MD order. Pt had 16mL in urine and has no discomfort or difficulty starting stream at this time. Will continue to monitor.

## 2013-04-08 ENCOUNTER — Encounter (HOSPITAL_COMMUNITY): Payer: Self-pay | Admitting: Anesthesiology

## 2013-04-08 LAB — GLUCOSE, CAPILLARY
GLUCOSE-CAPILLARY: 183 mg/dL — AB (ref 70–99)
GLUCOSE-CAPILLARY: 197 mg/dL — AB (ref 70–99)
GLUCOSE-CAPILLARY: 222 mg/dL — AB (ref 70–99)
GLUCOSE-CAPILLARY: 200 mg/dL — AB (ref 70–99)
Glucose-Capillary: 149 mg/dL — ABNORMAL HIGH (ref 70–99)

## 2013-04-08 LAB — BASIC METABOLIC PANEL
BUN: 11 mg/dL (ref 6–23)
CHLORIDE: 102 meq/L (ref 96–112)
CO2: 27 mEq/L (ref 19–32)
Calcium: 8.8 mg/dL (ref 8.4–10.5)
Creatinine, Ser: 0.83 mg/dL (ref 0.50–1.35)
GFR calc Af Amer: >90 mL/min (ref >90)
GFR calc non Af Amer: >90 mL/min (ref >90)
GLUCOSE: 228 mg/dL — AB (ref 70–99)
Potassium: 4.1 mEq/L (ref 3.7–5.3)
Sodium: 141 mEq/L (ref 137–147)

## 2013-04-08 MED ORDER — PIPERACILLIN-TAZOBACTAM 3.375 G IVPB
INTRAVENOUS | Status: AC
  Filled 2013-04-08: qty 100

## 2013-04-08 NOTE — Progress Notes (Signed)
NAME:  Forestine ChuteBELCHER, Dagon              ACCOUNT NO.:  [id number]  MEDICAL RECORD NO.:  [id number]  LOCATION:  A302                          FACILITY:  [facility]  PHYSICIAN:  Helmi Hechavarria L. Juanetta GoslingHawkins, M.D.DATE OF BIRTH:  1960-03-29  DATE OF PROCEDURE: DATE OF DISCHARGE:                                PROGRESS NOTE   Aaron Potter is in the hospital now with cellulitis in his groin area. He says he feels somewhat better but still having a lot of discomfort. He has no other new complaints.  PHYSICAL EXAMINATION:  GENERAL:  He is morbidly obese. VITAL SIGNS:  Temperature 97.9, pulse 91, respirations 22, blood pressure 153/79, O2 sats 97%. CHEST:  Minimal wheeze. HEART:  Regular without gallop. ABDOMEN:  Soft, obese without masses.  He has edema of the scrotum and foreskin of his penis and he has some erythema on the pubic ramus skin area.  ASSESSMENT:  He has significant problems with what I think cellulitis in his groin.  He has morbid obesity, chronic obstructive pulmonary disease.  He is diabetic which courses makes it is more concerning.  He has hypertension which is pretty well controlled.  My plan is to continue with his current medications and treatments and follow.     Tenya Araque L. Juanetta GoslingHawkins, M.D.     ELH/MEDQ  D:  04/07/2013  T:  04/08/2013  Job:  [id number]

## 2013-04-08 NOTE — Progress Notes (Signed)
Subjective: He says he feels better. He has no new complaints. He is improved as far as his cellulitis is concerned.  Objective: Vital signs in last 24 hours: Temp:  [97.4 F (36.3 C)-97.9 F (36.6 C)] 97.4 F (36.3 C) (04/06 0526) Pulse Rate:  [74-82] 74 (04/06 0526) Resp:  [20] 20 (04/06 0526) BP: (149-150)/(63-75) 149/63 mmHg (04/06 0526) SpO2:  [95 %-97 %] 97 % (04/06 0651) Weight change:  Last BM Date: 04/07/13  Intake/Output from previous day: 04/05 0701 - 04/06 0700 In: 1580 [P.O.:480; IV Piggyback:1100] Out: 1800 [Urine:1800]  PHYSICAL EXAM General appearance: alert, cooperative, mild distress and morbidly obese Resp: clear to auscultation bilaterally Cardio: regular rate and rhythm, S1, S2 normal, no murmur, click, rub or gallop GI: The changes of cellulitis in the area of his suprapubic area are much better. Extremities: extremities normal, atraumatic, no cyanosis or edema  Lab Results:    Basic Metabolic Panel:  Recent Labs  16/10/96 0532 04/08/13 0454  NA 136* 141  K 4.6 4.1  CL 99 102  CO2 27 27  GLUCOSE 196* 228*  BUN 12 11  CREATININE 0.84 0.83  CALCIUM 8.8 8.8   Liver Function Tests: No results found for this basename: AST, ALT, ALKPHOS, BILITOT, PROT, ALBUMIN,  in the last 72 hours No results found for this basename: LIPASE, AMYLASE,  in the last 72 hours No results found for this basename: AMMONIA,  in the last 72 hours CBC:  Recent Labs  04/06/13 0919 04/07/13 0532  WBC 8.4 7.4  NEUTROABS 5.1  --   HGB 15.8 14.1  HCT 45.5 42.7  MCV 92.5 93.6  PLT 285 289   Cardiac Enzymes: No results found for this basename: CKTOTAL, CKMB, CKMBINDEX, TROPONINI,  in the last 72 hours BNP: No results found for this basename: PROBNP,  in the last 72 hours D-Dimer: No results found for this basename: DDIMER,  in the last 72 hours CBG:  Recent Labs  04/06/13 2055 04/07/13 0723 04/07/13 1129 04/07/13 1601 04/07/13 2136 04/08/13 0708  GLUCAP  152* 225* 165* 169* 183* 197*   Hemoglobin A1C:  Recent Labs  04/06/13 0919  HGBA1C 7.9*   Fasting Lipid Panel: No results found for this basename: CHOL, HDL, LDLCALC, TRIG, CHOLHDL, LDLDIRECT,  in the last 72 hours Thyroid Function Tests: No results found for this basename: TSH, T4TOTAL, FREET4, T3FREE, THYROIDAB,  in the last 72 hours Anemia Panel: No results found for this basename: VITAMINB12, FOLATE, FERRITIN, TIBC, IRON, RETICCTPCT,  in the last 72 hours Coagulation: No results found for this basename: LABPROT, INR,  in the last 72 hours Urine Drug Screen: Drugs of Abuse  No results found for this basename: labopia, cocainscrnur, labbenz, amphetmu, thcu, labbarb    Alcohol Level: No results found for this basename: ETH,  in the last 72 hours Urinalysis:  Recent Labs  04/06/13 1854  COLORURINE YELLOW  LABSPEC 1.025  PHURINE 5.5  GLUCOSEU >1000*  HGBUR TRACE*  BILIRUBINUR NEGATIVE  KETONESUR 15*  PROTEINUR NEGATIVE  UROBILINOGEN 0.2  NITRITE NEGATIVE  LEUKOCYTESUR NEGATIVE   Misc. Labs:  ABGS No results found for this basename: PHART, PCO2, PO2ART, TCO2, HCO3,  in the last 72 hours CULTURES Recent Results (from the past 240 hour(s))  WOUND CULTURE     Status: None   Collection Time    04/06/13  9:40 AM      Result Value Ref Range Status   Specimen Description PENIS   Final   Special Requests  Normal   Final   Gram Stain     Final   Value: MODERATE WBC PRESENT, PREDOMINANTLY PMN     RARE SQUAMOUS EPITHELIAL CELLS PRESENT     MODERATE YEAST     Performed at [facility]   Culture     Final   Value: MODERATE YEAST CONSISTENT WITH CANDIDA SPECIES     Performed at [facility]   Report Status PENDING   Incomplete   Studies/Results: No results found.  Medications:  Prior to Admission:  Prescriptions prior to admission  Medication Sig Dispense Refill  . albuterol (PROVENTIL HFA;VENTOLIN HFA) 108 (90 BASE) MCG/ACT inhaler Inhale 2 puffs  into the lungs 2 (two) times daily as needed for wheezing or shortness of breath.      Marland Kitchen albuterol (PROVENTIL) (2.5 MG/3ML) 0.083% nebulizer solution Take 2.5 mg by nebulization every 6 (six) hours as needed for wheezing or shortness of breath.       . allopurinol (ZYLOPRIM) 300 MG tablet Take 300 mg by mouth daily.        Marland Kitchen ALPRAZolam (XANAX) 1 MG tablet Take 1 mg by mouth 4 (four) times daily.        . benazepril (LOTENSIN) 5 MG tablet Take 5 mg by mouth daily.      . cephALEXin (KEFLEX) 250 MG capsule Take 500 mg by mouth 3 (three) times daily.      Marland Kitchen HYDROcodone-acetaminophen (NORCO) 10-325 MG per tablet Take 1 tablet by mouth 4 (four) times daily.      . indomethacin (INDOCIN) 50 MG capsule Take 50 mg by mouth 1 day or 1 dose.        . insulin aspart (NOVOLOG FLEXPEN) 100 UNIT/ML FlexPen Inject 10-14 Units into the skin 3 (three) times daily with meals.      . insulin glargine (LANTUS) 100 UNIT/ML injection Inject 70 Units into the skin at bedtime.      . Liraglutide (VICTOZA) 18 MG/3ML SOPN Inject 1.8 Units into the skin daily.      . metFORMIN (GLUCOPHAGE) 500 MG tablet Take 1,000 mg by mouth 2 (two) times daily with a meal.       . Multiple Vitamin (MULTIVITAMIN WITH MINERALS) TABS tablet Take 1 tablet by mouth daily.      . pravastatin (PRAVACHOL) 40 MG tablet Take 40 mg by mouth daily.      . saxagliptin HCl (ONGLYZA) 5 MG TABS tablet Take 5 mg by mouth daily.      Marland Kitchen tiotropium (SPIRIVA) 18 MCG inhalation capsule Place 18 mcg into inhaler and inhale daily.      . verapamil (CALAN-SR) 240 MG CR tablet Take 240 mg by mouth daily.      . Vitamin D, Ergocalciferol, (DRISDOL) 50000 UNITS CAPS capsule Take 50,000 Units by mouth every 7 (seven) days. On Tuesday's.      . zolpidem (AMBIEN) 10 MG tablet Take 10 mg by mouth at bedtime as needed for sleep.        Scheduled: . allopurinol  300 mg Oral Daily  . ALPRAZolam  1 mg Oral QID  . benazepril  5 mg Oral Daily  . enoxaparin (LOVENOX)  injection  80 mg Subcutaneous Q24H  . HYDROcodone-acetaminophen  1 tablet Oral QID  . indomethacin  50 mg Oral Q breakfast  . insulin aspart  0-5 Units Subcutaneous QHS  . insulin aspart  0-9 Units Subcutaneous TID WC  . insulin aspart  3 Units Subcutaneous TID WC  .  insulin glargine  70 Units Subcutaneous QHS  . linagliptin  5 mg Oral Daily  . metFORMIN  1,000 mg Oral BID WC  . multivitamin with minerals  1 tablet Oral Daily  . nystatin-triamcinolone   Topical TID  . piperacillin-tazobactam (ZOSYN)  IV  3.375 g Intravenous Q8H  . simvastatin  5 mg Oral q1800  . tiotropium  18 mcg Inhalation Daily  . vancomycin  1,500 mg Intravenous Q12H  . verapamil  240 mg Oral Daily   Continuous:  NWG:[id number]:albuterol, HYDROcodone-acetaminophen, morphine injection, ondansetron (ZOFRAN) IV, ondansetron, zolpidem  Assesment: He has cellulitis in his foreskin. This is spread into the suprapubic area but that's better. He has diabetes and his blood sugars are running around 200 probably related to the infection. He has hypertension which is stable. He has morbid obesity which is unchanged. He has sleep apnea and is using CPAP. He said when he had: surgery for cholecystectomy that he had some trouble waking up which may be related to his sleep apnea.  Active Problems:   Cellulitis   Essential hypertension, benign   Type II or unspecified type diabetes mellitus without mention of complication, not stated as uncontrolled   Morbid obesity    Plan for circumcision tomorrow   LOS: 2 days   Aaron Potter 04/08/2013, 8:54 AM

## 2013-04-08 NOTE — Progress Notes (Signed)
Feels better  Still tender to touch will examine under anesthesia and do circumcision explained  Him about circumcision.

## 2013-04-08 NOTE — Progress Notes (Signed)
UR chart review completed.  

## 2013-04-08 NOTE — Consult Note (Signed)
NAME:  Aaron Potter, Aaron Potter              ACCOUNT NO.:  [id number]  MEDICAL RECORD NO.:  [id number]  LOCATION:  A302                          FACILITY:  [facility]  PHYSICIAN:  Ky BarbanMohammad I. Ajit Errico, M.D.DATE OF BIRTH:  06-12-1960  DATE OF CONSULTATION: DATE OF DISCHARGE:                                CONSULTATION   CHIEF COMPLAINT:  Infected foreskin.  HISTORY:  A 53 year old gentleman who has multiple medical problems including hypertension, diabetes, COPD came to the emergency room with main concerns of several days duration of progressively worsening foreskin rash.  He reports associated burning with urination, difficulty retracting foreskin, progressively worse swelling and redness.  He was seen by his primary care physician several days ago, was given antibiotic shot.  It is not getting better, so he came to the emergency room.  He was admitted because of, insulin-dependent diabetes, and he started on IV antibiotics.  He says maybe it is a little bit better but not a whole lot.  He has no other medical complaints.  No chest pain, shortness of breath, orthopnea, PND, nausea, vomiting.  PAST MEDICAL HISTORY:  History of having cholecystectomy in the past and markedly obese, morbid obesity.  Has COPD and hypertension takes several different medicines.  FAMILY HISTORY:  No history of prostate cancer.  He says he is voiding with good urinary stream although he gets up 3 times at night.  PERSONAL HISTORY:  He never smoked.  Does not drink alcohol.  Does not take any illicit drugs.  REVIEW OF SYSTEMS:  Otherwise, unremarkable.  He has difficulty and walking because he gets short of breath.  PHYSICAL EXAMINATION:  VITAL SIGNS:  Blood pressure 153/79, temperature is 97.8. GENERAL:  Markedly obese male, not in any acute distress. ABDOMEN:  Obese.  Liver, spleen, kidneys not palpable.  There is scar in the right upper quadrant from previous cholecystectomy. 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 Penis is completely disappeared inside the prepubic fat.  That area looks quite inflamed and very tender.  I could not get to the penis out of the foreskin. RECTAL:  Prostate hard to reach, is about 30 g, smooth and firm.  IMPRESSION:  Concealed penis with prostatitis.  Recommend circumcision under anesthesia.  He should continue his present antibiotic regimen. We will go ahead and add Mycolog topical cream 3 times a day for any yeast infection.  I will schedule a circumcision under anesthesia on Tuesday, I discussed with the patient, he is agreeable. I have told him that his penis is concealed.  It is going to be like that, under anesthesia, I should be able to examine him better because right now I cannot examine him because of his tenderness in the penis.  LAB DATA:  His BUN is 12, creatinine is 0.8.  WBC count is 8.4, hematocrit is 45.5.  We will follow him.     Ky BarbanMohammad I. Spenser Harren, M.D.     MIJ/MEDQ  D:  04/07/2013  T:  04/08/2013  Job:  [id number]

## 2013-04-09 ENCOUNTER — Encounter (HOSPITAL_COMMUNITY)
Admission: EM | Disposition: A | Discharge status: Home / Self Care | Payer: Self-pay | Source: Home / Self Care | Attending: Pulmonary Disease

## 2013-04-09 LAB — WOUND CULTURE: Special Requests: NORMAL

## 2013-04-09 LAB — GLUCOSE, CAPILLARY
GLUCOSE-CAPILLARY: 187 mg/dL — AB (ref 70–99)
GLUCOSE-CAPILLARY: 158 mg/dL — AB (ref 70–99)
GLUCOSE-CAPILLARY: 170 mg/dL — AB (ref 70–99)
GLUCOSE-CAPILLARY: 133 mg/dL — AB (ref 70–99)
Glucose-Capillary: 158 mg/dL — ABNORMAL HIGH (ref 70–99)

## 2013-04-09 LAB — MRSA PCR SCREENING: MRSA BY PCR: NEGATIVE

## 2013-04-09 SURGERY — CANCELLED PROCEDURE

## 2013-04-09 MED ORDER — INSULIN ASPART 100 UNIT/ML ~~LOC~~ SOLN
0.0000 [IU] | Freq: Three times a day (TID) | SUBCUTANEOUS | Status: DC
  Administered 2013-04-09 – 2013-04-11 (×7): 4 [IU] via SUBCUTANEOUS
  Administered 2013-04-11: 7 [IU] via SUBCUTANEOUS
  Administered 2013-04-12: 3 [IU] via SUBCUTANEOUS

## 2013-04-09 MED ORDER — FENTANYL CITRATE 0.05 MG/ML IJ SOLN
INTRAMUSCULAR | Status: AC
  Filled 2013-04-09: qty 2

## 2013-04-09 MED ORDER — LIDOCAINE HCL (PF) 1 % IJ SOLN
INTRAMUSCULAR | Status: AC
  Filled 2013-04-09: qty 5

## 2013-04-09 MED ORDER — BUPIVACAINE HCL (PF) 0.5 % IJ SOLN
INTRAMUSCULAR | Status: AC
  Filled 2013-04-09: qty 30

## 2013-04-09 MED ORDER — INSULIN ASPART 100 UNIT/ML ~~LOC~~ SOLN
4.0000 [IU] | Freq: Three times a day (TID) | SUBCUTANEOUS | Status: DC
  Administered 2013-04-09 – 2013-04-12 (×9): 4 [IU] via SUBCUTANEOUS

## 2013-04-09 MED ORDER — PIPERACILLIN-TAZOBACTAM 3.375 G IVPB
3.3750 g | Freq: Three times a day (TID) | INTRAVENOUS | Status: DC
  Administered 2013-04-09 – 2013-04-11 (×5): 3.375 g via INTRAVENOUS
  Filled 2013-04-09 (×10): qty 50

## 2013-04-09 MED ORDER — INSULIN ASPART 100 UNIT/ML ~~LOC~~ SOLN
0.0000 [IU] | Freq: Every day | SUBCUTANEOUS | Status: DC
  Administered 2013-04-10: 2 [IU] via SUBCUTANEOUS

## 2013-04-09 MED ORDER — PROPOFOL 10 MG/ML IV BOLUS
INTRAVENOUS | Status: AC
  Filled 2013-04-09: qty 20

## 2013-04-09 SURGICAL SUPPLY — 21 items
BNDG CONFORM 2 STRL LF (GAUZE/BANDAGES/DRESSINGS) ×3 IMPLANT
CLOTH BEACON ORANGE TIMEOUT ST (SAFETY) ×3 IMPLANT
COVER LIGHT HANDLE STERIS (MISCELLANEOUS) ×6 IMPLANT
DECANTER SPIKE VIAL GLASS SM (MISCELLANEOUS) ×3 IMPLANT
ELECT NEEDLE TIP 2.8 STRL (NEEDLE) IMPLANT
FORMALIN 10 PREFIL 120ML (MISCELLANEOUS) ×3 IMPLANT
GAUZE PETROLATUM 1 X8 (GAUZE/BANDAGES/DRESSINGS) ×3 IMPLANT
GLOVE BIO SURGEON STRL SZ7 (GLOVE) ×3 IMPLANT
GOWN STRL REUS W/TWL LRG LVL3 (GOWN DISPOSABLE) ×6 IMPLANT
KIT ROOM TURNOVER AP CYSTO (KITS) ×3 IMPLANT
MANIFOLD NEPTUNE II (INSTRUMENTS) ×3 IMPLANT
NEEDLE HYPO 25X1 1.5 SAFETY (NEEDLE) ×3 IMPLANT
NS IRRIG 1000ML POUR BTL (IV SOLUTION) ×3 IMPLANT
PACK MINOR (CUSTOM PROCEDURE TRAY) ×3 IMPLANT
PAD ARMBOARD 7.5X6 YLW CONV (MISCELLANEOUS) ×3 IMPLANT
SET BASIN LINEN APH (SET/KITS/TRAYS/PACK) ×3 IMPLANT
SOL PREP PROV IODINE SCRUB 4OZ (MISCELLANEOUS) ×3 IMPLANT
SUT CHROMIC 3 0 SH 27 (SUTURE) ×3 IMPLANT
SUT VICRYL AB 3 0 TIES (SUTURE) ×3 IMPLANT
SYR CONTROL 10ML LL (SYRINGE) ×3 IMPLANT
TOWEL OR 17X26 4PK STRL BLUE (TOWEL DISPOSABLE) ×3 IMPLANT

## 2013-04-09 NOTE — Anesthesia Preprocedure Evaluation (Deleted)

## 2013-04-09 NOTE — Care Management Note (Addendum)
    Page 1 of 1   04/10/2013     1:46:55 PM   CARE MANAGEMENT NOTE 04/10/2013  Patient:  Aaron Potter,Aaron Potter   Account Number:  [id number]  Date Initiated:  04/09/2013  Documentation initiated by:  Sharrie RothmanBLACKWELL,Satine Hausner C  Subjective/Objective Assessment:   Pt admitted from home with cellulitis of penis. Pt lives alone and will return home at discharge. Pt is independent with ADL's.     Action/Plan:   No CM needs noted at this time. Will continue to follow for discharge planning needs.   Anticipated DC Date:  04/11/2013   Anticipated DC Plan:  HOME/SELF CARE      DC Planning Services  CM consult      Choice offered to / List presented to:             Status of service:  Completed, signed off Medicare Important Message given?   (If response is "NO", the following Medicare IM given date fields will be blank) Date Medicare IM given:   Date Additional Medicare IM given:    Discharge Disposition:  ACUTE TO ACUTE TRANS  Per UR Regulation:    If discussed at Long Length of Stay Meetings, dates discussed:    Comments:  04/10/13 1345 Arlyss Queenammy Duron Meister, RN BSN CM [facility]. CM on unit assigned will follow for discharge planning needs.  04/09/13 1200 Arlyss Queenammy Sheylin Scharnhorst, RN BSN CM

## 2013-04-09 NOTE — OR Nursing (Signed)
Surgery cancelled due to anesthesia  Problems with breathing and intubation.  Dr. Jayme CloudGonzalez  In to talk with patient, and sisters  .  Surgery cancelled   Per Dr. Jerre SimonJavaid and Dr. Jayme CloudGonzalez surgery cancelled.

## 2013-04-09 NOTE — Progress Notes (Signed)
Will hold all am medications and insulin until after surgery per Dr. Juanetta GoslingHawkins.

## 2013-04-09 NOTE — OR Nursing (Signed)
Report called and given to Constellation Brandsjennifer Coffey  RN  . Patient transferred back to room.

## 2013-04-09 NOTE — Progress Notes (Signed)
Subjective: He feels okay and is set for circumcision today.  Objective: Vital signs in last 24 hours: Temp:  [97.6 F (36.4 C)-98.1 F (36.7 C)] 97.6 F (36.4 C) (04/07 0707) Pulse Rate:  [75-80] 79 (04/07 0707) Resp:  [20] 20 (04/07 0707) BP: (115-156)/(72-79) 156/79 mmHg (04/07 0707) SpO2:  [95 %-98 %] 98 % (04/07 0707) Weight change:  Last BM Date: 04/08/13  Intake/Output from previous day: 04/06 0701 - 04/07 0700 In: 2640 [P.O.:1440; IV Piggyback:1200] Out: 200 [Urine:200]  PHYSICAL EXAM General appearance: alert, cooperative and no distress Resp: clear to auscultation bilaterally Cardio: regular rate and rhythm, S1, S2 normal, no murmur, click, rub or gallop GI: soft, non-tender; bowel sounds normal; no masses,  no organomegaly Extremities: extremities normal, atraumatic, no cyanosis or edema  Lab Results:    Basic Metabolic Panel:  Recent Labs  16/10/9602/05/15 0532 04/08/13 0454  NA 136* 141  K 4.6 4.1  CL 99 102  CO2 27 27  GLUCOSE 196* 228*  BUN 12 11  CREATININE 0.84 0.83  CALCIUM 8.8 8.8   Liver Function Tests: No results found for this basename: AST, ALT, ALKPHOS, BILITOT, PROT, ALBUMIN,  in the last 72 hours No results found for this basename: LIPASE, AMYLASE,  in the last 72 hours No results found for this basename: AMMONIA,  in the last 72 hours CBC:  Recent Labs  04/06/13 0919 04/07/13 0532  WBC 8.4 7.4  NEUTROABS 5.1  --   HGB 15.8 14.1  HCT 45.5 42.7  MCV 92.5 93.6  PLT 285 289   Cardiac Enzymes: No results found for this basename: CKTOTAL, CKMB, CKMBINDEX, TROPONINI,  in the last 72 hours BNP: No results found for this basename: PROBNP,  in the last 72 hours D-Dimer: No results found for this basename: DDIMER,  in the last 72 hours CBG:  Recent Labs  04/07/13 2136 04/08/13 0708 04/08/13 1153 04/08/13 1627 04/08/13 2042 04/09/13 0730  GLUCAP 183* 197* 222* 149* 200* 187*   Hemoglobin A1C:  Recent Labs  04/06/13 0919   HGBA1C 7.9*   Fasting Lipid Panel: No results found for this basename: CHOL, HDL, LDLCALC, TRIG, CHOLHDL, LDLDIRECT,  in the last 72 hours Thyroid Function Tests: No results found for this basename: TSH, T4TOTAL, FREET4, T3FREE, THYROIDAB,  in the last 72 hours Anemia Panel: No results found for this basename: VITAMINB12, FOLATE, FERRITIN, TIBC, IRON, RETICCTPCT,  in the last 72 hours Coagulation: No results found for this basename: LABPROT, INR,  in the last 72 hours Urine Drug Screen: Drugs of Abuse  No results found for this basename: labopia, cocainscrnur, labbenz, amphetmu, thcu, labbarb    Alcohol Level: No results found for this basename: ETH,  in the last 72 hours Urinalysis:  Recent Labs  04/06/13 1854  COLORURINE YELLOW  LABSPEC 1.025  PHURINE 5.5  GLUCOSEU >1000*  HGBUR TRACE*  BILIRUBINUR NEGATIVE  KETONESUR 15*  PROTEINUR NEGATIVE  UROBILINOGEN 0.2  NITRITE NEGATIVE  LEUKOCYTESUR NEGATIVE   Misc. Labs:  ABGS No results found for this basename: PHART, PCO2, PO2ART, TCO2, HCO3,  in the last 72 hours CULTURES Recent Results (from the past 240 hour(s))  WOUND CULTURE     Status: None   Collection Time    04/06/13  9:40 AM      Result Value Ref Range Status   Specimen Description PENIS   Final   Special Requests Normal   Final   Gram Stain     Final   Value: MODERATE  WBC PRESENT, PREDOMINANTLY PMN     RARE SQUAMOUS EPITHELIAL CELLS PRESENT     MODERATE YEAST     Performed at [facility]   Culture     Final   Value: MODERATE CANDIDA ALBICANS     Performed at [facility]   Report Status 04/09/2013 FINAL   Final   Studies/Results: No results found.  Medications:  Prior to Admission:  Prescriptions prior to admission  Medication Sig Dispense Refill  . albuterol (PROVENTIL HFA;VENTOLIN HFA) 108 (90 BASE) MCG/ACT inhaler Inhale 2 puffs into the lungs 2 (two) times daily as needed for wheezing or shortness of breath.      Marland Kitchen albuterol  (PROVENTIL) (2.5 MG/3ML) 0.083% nebulizer solution Take 2.5 mg by nebulization every 6 (six) hours as needed for wheezing or shortness of breath.       . allopurinol (ZYLOPRIM) 300 MG tablet Take 300 mg by mouth daily.        Marland Kitchen ALPRAZolam (XANAX) 1 MG tablet Take 1 mg by mouth 4 (four) times daily.        . benazepril (LOTENSIN) 5 MG tablet Take 5 mg by mouth daily.      . cephALEXin (KEFLEX) 250 MG capsule Take 500 mg by mouth 3 (three) times daily.      Marland Kitchen HYDROcodone-acetaminophen (NORCO) 10-325 MG per tablet Take 1 tablet by mouth 4 (four) times daily.      . indomethacin (INDOCIN) 50 MG capsule Take 50 mg by mouth 1 day or 1 dose.        . insulin aspart (NOVOLOG FLEXPEN) 100 UNIT/ML FlexPen Inject 10-14 Units into the skin 3 (three) times daily with meals.      . insulin glargine (LANTUS) 100 UNIT/ML injection Inject 70 Units into the skin at bedtime.      . Liraglutide (VICTOZA) 18 MG/3ML SOPN Inject 1.8 Units into the skin daily.      . metFORMIN (GLUCOPHAGE) 500 MG tablet Take 1,000 mg by mouth 2 (two) times daily with a meal.       . Multiple Vitamin (MULTIVITAMIN WITH MINERALS) TABS tablet Take 1 tablet by mouth daily.      . pravastatin (PRAVACHOL) 40 MG tablet Take 40 mg by mouth daily.      . saxagliptin HCl (ONGLYZA) 5 MG TABS tablet Take 5 mg by mouth daily.      Marland Kitchen tiotropium (SPIRIVA) 18 MCG inhalation capsule Place 18 mcg into inhaler and inhale daily.      . verapamil (CALAN-SR) 240 MG CR tablet Take 240 mg by mouth daily.      . Vitamin D, Ergocalciferol, (DRISDOL) 50000 UNITS CAPS capsule Take 50,000 Units by mouth every 7 (seven) days. On Tuesday's.      . zolpidem (AMBIEN) 10 MG tablet Take 10 mg by mouth at bedtime as needed for sleep.        Scheduled: . allopurinol  300 mg Oral Daily  . ALPRAZolam  1 mg Oral QID  . benazepril  5 mg Oral Daily  . enoxaparin (LOVENOX) injection  80 mg Subcutaneous Q24H  . HYDROcodone-acetaminophen  1 tablet Oral QID  . indomethacin  50  mg Oral Q breakfast  . insulin aspart  0-20 Units Subcutaneous TID WC  . insulin aspart  0-5 Units Subcutaneous QHS  . insulin aspart  4 Units Subcutaneous TID WC  . insulin glargine  70 Units Subcutaneous QHS  . linagliptin  5 mg Oral Daily  . metFORMIN  1,000 mg Oral BID WC  . multivitamin with minerals  1 tablet Oral Daily  . nystatin-triamcinolone   Topical TID  . piperacillin-tazobactam (ZOSYN)  IV  3.375 g Intravenous Q8H  . simvastatin  5 mg Oral q1800  . tiotropium  18 mcg Inhalation Daily  . vancomycin  1,500 mg Intravenous Q12H  . verapamil  240 mg Oral Daily   Continuous:  WUJ:[id number], HYDROcodone-acetaminophen, morphine injection, ondansetron (ZOFRAN) IV, ondansetron, zolpidem  Assesment: He has cellulitis of the groin. He has inflammation and infection of his foreskin. He has multiple other medical problems which appear to be stable. Active Problems:   Cellulitis   Essential hypertension, benign   Type II or unspecified type diabetes mellitus without mention of complication, not stated as uncontrolled   Morbid obesity    Plan: For circumcision today    LOS: 3 days   Aaron Potter L 04/09/2013, 9:11 AM

## 2013-04-10 DIAGNOSIS — J449 Chronic obstructive pulmonary disease, unspecified: Secondary | ICD-10-CM | POA: Diagnosis present

## 2013-04-10 DIAGNOSIS — G4733 Obstructive sleep apnea (adult) (pediatric): Secondary | ICD-10-CM | POA: Diagnosis present

## 2013-04-10 DIAGNOSIS — N481 Balanitis: Secondary | ICD-10-CM | POA: Diagnosis present

## 2013-04-10 LAB — BASIC METABOLIC PANEL
BUN: 9 mg/dL (ref 6–23)
CALCIUM: 8.8 mg/dL (ref 8.4–10.5)
CO2: 28 mEq/L (ref 19–32)
Chloride: 104 mEq/L (ref 96–112)
Creatinine, Ser: 0.73 mg/dL (ref 0.50–1.35)
GFR calc non Af Amer: >90 mL/min (ref >90)
Glucose, Bld: 197 mg/dL — ABNORMAL HIGH (ref 70–99)
Potassium: 4.1 mEq/L (ref 3.7–5.3)
Sodium: 141 mEq/L (ref 137–147)

## 2013-04-10 LAB — GLUCOSE, CAPILLARY
Glucose-Capillary: 183 mg/dL — ABNORMAL HIGH (ref 70–99)
Glucose-Capillary: 184 mg/dL — ABNORMAL HIGH (ref 70–99)
Glucose-Capillary: 216 mg/dL — ABNORMAL HIGH (ref 70–99)

## 2013-04-10 MED ORDER — LIDOCAINE HCL 2 % EX GEL
CUTANEOUS | Status: AC
  Administered 2013-04-10: 5 via URETHRAL
  Filled 2013-04-10: qty 5

## 2013-04-10 NOTE — Progress Notes (Signed)
Subjective: He was not able to have his surgery done yesterday. He was felt to be too high risk by anesthesia because of his multiple medical problems and because he gives a history of being difficult to "wake up" after his cholecystectomy which [facility].  Objective: Vital signs in last 24 hours: Temp:  [97.3 F (36.3 C)-98.2 F (36.8 C)] 97.3 F (36.3 C) (04/08 0456) Pulse Rate:  [67-73] 67 (04/08 0456) Resp:  [20] 20 (04/08 0456) BP: (110-148)/(63-76) 110/63 mmHg (04/08 0456) SpO2:  [97 %] 97 % (04/08 0456) Weight change:  Last BM Date: 04/09/13  Intake/Output from previous day: 04/07 0701 - 04/08 0700 In: 720 [P.O.:720] Out: -   PHYSICAL EXAM General appearance: alert, cooperative, no distress and morbidly obese Resp: clear to auscultation bilaterally Cardio: regular rate and rhythm, S1, S2 normal, no murmur, click, rub or gallop GI: Morbidly obese. He still has marked inflammation of his penis. There is surrounding cellulitis appears to be better Extremities: extremities normal, atraumatic, no cyanosis or edema  Lab Results:    Basic Metabolic Panel:  Recent Labs  56/21/3002/06/18 0454 04/10/13 0446  NA 141 141  K 4.1 4.1  CL 102 104  CO2 27 28  GLUCOSE 228* 197*  BUN 11 9  CREATININE 0.83 0.73  CALCIUM 8.8 8.8   Liver Function Tests: No results found for this basename: AST, ALT, ALKPHOS, BILITOT, PROT, ALBUMIN,  in the last 72 hours No results found for this basename: LIPASE, AMYLASE,  in the last 72 hours No results found for this basename: AMMONIA,  in the last 72 hours CBC: No results found for this basename: WBC, NEUTROABS, HGB, HCT, MCV, PLT,  in the last 72 hours Cardiac Enzymes: No results found for this basename: CKTOTAL, CKMB, CKMBINDEX, TROPONINI,  in the last 72 hours BNP: No results found for this basename: PROBNP,  in the last 72 hours D-Dimer: No results found for this basename: DDIMER,  in the last 72  hours CBG:  Recent Labs  04/09/13 0730 04/09/13 1006 04/09/13 1123 04/09/13 1615 04/09/13 2216 04/10/13 0736  GLUCAP 187* 158* 158* 170* 133* 183*   Hemoglobin A1C: No results found for this basename: HGBA1C,  in the last 72 hours Fasting Lipid Panel: No results found for this basename: CHOL, HDL, LDLCALC, TRIG, CHOLHDL, LDLDIRECT,  in the last 72 hours Thyroid Function Tests: No results found for this basename: TSH, T4TOTAL, FREET4, T3FREE, THYROIDAB,  in the last 72 hours Anemia Panel: No results found for this basename: VITAMINB12, FOLATE, FERRITIN, TIBC, IRON, RETICCTPCT,  in the last 72 hours Coagulation: No results found for this basename: LABPROT, INR,  in the last 72 hours Urine Drug Screen: Drugs of Abuse  No results found for this basename: labopia, cocainscrnur, labbenz, amphetmu, thcu, labbarb    Alcohol Level: No results found for this basename: ETH,  in the last 72 hours Urinalysis: No results found for this basename: COLORURINE, APPERANCEUR, LABSPEC, PHURINE, GLUCOSEU, HGBUR, BILIRUBINUR, KETONESUR, PROTEINUR, UROBILINOGEN, NITRITE, LEUKOCYTESUR,  in the last 72 hours Misc. Labs:  ABGS No results found for this basename: PHART, PCO2, PO2ART, TCO2, HCO3,  in the last 72 hours CULTURES Recent Results (from the past 240 hour(s))  WOUND CULTURE     Status: None   Collection Time    04/06/13  9:40 AM      Result Value Ref Range Status   Specimen Description PENIS   Final   Special Requests Normal   Final  Gram Stain     Final   Value: MODERATE WBC PRESENT, PREDOMINANTLY PMN     RARE SQUAMOUS EPITHELIAL CELLS PRESENT     MODERATE YEAST     Performed at [facility]   Culture     Final   Value: MODERATE CANDIDA ALBICANS     Performed at [facility]   Report Status 04/09/2013 FINAL   Final  MRSA PCR SCREENING     Status: None   Collection Time    04/09/13  8:45 AM      Result Value Ref Range Status   MRSA by PCR NEGATIVE  NEGATIVE Final    Comment:            The GeneXpert MRSA Assay (FDA     approved for NASAL specimens     only), is one component of a     comprehensive MRSA colonization     surveillance program. It is not     intended to diagnose MRSA     infection nor to guide or     monitor treatment for     MRSA infections.   Studies/Results: No results found.  Medications:  Prior to Admission:  Prescriptions prior to admission  Medication Sig Dispense Refill  . albuterol (PROVENTIL HFA;VENTOLIN HFA) 108 (90 BASE) MCG/ACT inhaler Inhale 2 puffs into the lungs 2 (two) times daily as needed for wheezing or shortness of breath.      Marland Kitchen albuterol (PROVENTIL) (2.5 MG/3ML) 0.083% nebulizer solution Take 2.5 mg by nebulization every 6 (six) hours as needed for wheezing or shortness of breath.       . allopurinol (ZYLOPRIM) 300 MG tablet Take 300 mg by mouth daily.        Marland Kitchen ALPRAZolam (XANAX) 1 MG tablet Take 1 mg by mouth 4 (four) times daily.        . benazepril (LOTENSIN) 5 MG tablet Take 5 mg by mouth daily.      . cephALEXin (KEFLEX) 250 MG capsule Take 500 mg by mouth 3 (three) times daily.      Marland Kitchen HYDROcodone-acetaminophen (NORCO) 10-325 MG per tablet Take 1 tablet by mouth 4 (four) times daily.      . indomethacin (INDOCIN) 50 MG capsule Take 50 mg by mouth 1 day or 1 dose.        . insulin aspart (NOVOLOG FLEXPEN) 100 UNIT/ML FlexPen Inject 10-14 Units into the skin 3 (three) times daily with meals.      . insulin glargine (LANTUS) 100 UNIT/ML injection Inject 70 Units into the skin at bedtime.      . Liraglutide (VICTOZA) 18 MG/3ML SOPN Inject 1.8 Units into the skin daily.      . metFORMIN (GLUCOPHAGE) 500 MG tablet Take 1,000 mg by mouth 2 (two) times daily with a meal.       . Multiple Vitamin (MULTIVITAMIN WITH MINERALS) TABS tablet Take 1 tablet by mouth daily.      . pravastatin (PRAVACHOL) 40 MG tablet Take 40 mg by mouth daily.      . saxagliptin HCl (ONGLYZA) 5 MG TABS tablet Take 5 mg by mouth daily.       Marland Kitchen tiotropium (SPIRIVA) 18 MCG inhalation capsule Place 18 mcg into inhaler and inhale daily.      . verapamil (CALAN-SR) 240 MG CR tablet Take 240 mg by mouth daily.      . Vitamin D, Ergocalciferol, (DRISDOL) 50000 UNITS CAPS capsule Take 50,000 Units by mouth every  7 (seven) days. On Tuesday's.      . zolpidem (AMBIEN) 10 MG tablet Take 10 mg by mouth at bedtime as needed for sleep.        Scheduled: . allopurinol  300 mg Oral Daily  . ALPRAZolam  1 mg Oral QID  . benazepril  5 mg Oral Daily  . enoxaparin (LOVENOX) injection  80 mg Subcutaneous Q24H  . HYDROcodone-acetaminophen  1 tablet Oral QID  . indomethacin  50 mg Oral Q breakfast  . insulin aspart  0-20 Units Subcutaneous TID WC  . insulin aspart  0-5 Units Subcutaneous QHS  . insulin aspart  4 Units Subcutaneous TID WC  . insulin glargine  70 Units Subcutaneous QHS  . linagliptin  5 mg Oral Daily  . metFORMIN  1,000 mg Oral BID WC  . multivitamin with minerals  1 tablet Oral Daily  . nystatin-triamcinolone   Topical TID  . piperacillin-tazobactam (ZOSYN)  IV  3.375 g Intravenous Q8H  . simvastatin  5 mg Oral q1800  . tiotropium  18 mcg Inhalation Daily  . vancomycin  1,500 mg Intravenous Q12H  . verapamil  240 mg Oral Daily   Continuous:  WUJ:[id number], HYDROcodone-acetaminophen, morphine injection, ondansetron (ZOFRAN) IV, ondansetron, zolpidem  Assesment: He has markedly inflamed penis and foreskin. Plans were made for him to have circumcision under anesthesia yesterday but he was felt to be too high risk. I haven't called him to urology at [facility]. He has COPD which is stable. He has sleep apnea on BiPAP and he seems to be doing okay. He has diabetes which is reasonably well controlled but is somewhat worse because of his infection. He has morbid obesity which I think is the basis of most of his problems. He has hypertension which is doing well Active Problems:   Cellulitis   Essential hypertension, benign   Type II  or unspecified type diabetes mellitus without mention of complication, not stated as uncontrolled   Morbid obesity    Plan: Continue current treatments I'm going to see if he can be transferred to a higher level of care    LOS: 4 days   Fredirick Maudlin 04/10/2013, 8:30 AM

## 2013-04-10 NOTE — Progress Notes (Signed)
Report called to Johney MaineKelsey Hubert at [facility]. Awaiting carelink for transport.

## 2013-04-10 NOTE — Consult Note (Addendum)
I have been asked to see the patient by Dr. Leatha Gilding, MD, for evaluation and management of penile foreskin infection.  History of present illness: The patient is a 53 year old male transferred from [facility] where he is been receiving IV antibiotics for a penile foreskin infection. The patient has a host of comorbidities including COPD, type 2 diabetes, and morbid obesity. The patient states that his symptoms began approximately 2 weeks ago when he was in the shower noted that he was unable to pop his penis out. The following week he presented to his primary care doctor and was started on by mouth antibiotics. His symptoms continue to progress including pain and difficulty urinating with a sprayed stream, and the patient was admitted to the hospital for IV antibiotics. He was seen by a urologist a pen and the recommendation was to take the patient to the OR for circumcision. However, the patient tells me that he has been circumcised as a child. The patient denies any fevers or chills. He denies a history of recurrent urinary tract infections or cellulitis. The patient tells me that since starting the IV antibiotics his symptoms have improved including pain. The patient tells me that he still gets good erections and has only not been able to pop his buried penis out for approximately 2 weeks. However, he did tell me the last time that he had intercourse he noticed that the skin around his penis was extremely tight.  Prior to this he was voiding normally with normal rectum this.  Review of systems: A 12 point comprehensive review of systems was obtained and is negative unless otherwise stated in the history of present illness.  Patient Active Problem List   Diagnosis Date Noted  . Balanitis 04/10/2013  . COPD (chronic obstructive pulmonary disease) 04/10/2013  . Obstructive sleep apnea 04/10/2013  . Essential hypertension, benign 04/07/2013  . Type II or unspecified type diabetes  mellitus without mention of complication, not stated as uncontrolled 04/07/2013  . Morbid obesity 04/07/2013  . Cellulitis 04/06/2013    No current facility-administered medications on file prior to encounter.   Current Outpatient Prescriptions on File Prior to Encounter  Medication Sig Dispense Refill  . albuterol (PROVENTIL) (2.5 MG/3ML) 0.083% nebulizer solution Take 2.5 mg by nebulization every 6 (six) hours as needed for wheezing or shortness of breath.       . allopurinol (ZYLOPRIM) 300 MG tablet Take 300 mg by mouth daily.        Marland Kitchen ALPRAZolam (XANAX) 1 MG tablet Take 1 mg by mouth 4 (four) times daily.        . indomethacin (INDOCIN) 50 MG capsule Take 50 mg by mouth 1 day or 1 dose.        . metFORMIN (GLUCOPHAGE) 500 MG tablet Take 1,000 mg by mouth 2 (two) times daily with a meal.       . zolpidem (AMBIEN) 10 MG tablet Take 10 mg by mouth at bedtime as needed for sleep.         Past Medical History  Diagnosis Date  . Diabetes mellitus without complication   . Asthma   . COPD (chronic obstructive pulmonary disease)   . Hypertension   . Gout   . Sleep apnea     Past Surgical History  Procedure Laterality Date  . Cholecystectomy      History  Substance Use Topics  . Smoking status: Never Smoker   . Smokeless tobacco: Not on file  . Alcohol  Use: No    History reviewed. No pertinent family history.  PE: Filed Vitals:   04/10/13 0831 04/10/13 1228 04/10/13 1250 04/10/13 1701  BP: 127/79 127/48 123/40 148/86  Pulse: 76 71 73 70  Temp:  96.6 F (35.9 C) 98.8 F (37.1 C) 97.5 F (36.4 C)  TempSrc:  Oral Oral Oral  Resp:  20 20 18   Height:      Weight:      SpO2:  96% 97% 95%   I NAD, appears to be in no acute distress  patient is alert and oriented x3 Atraumatic normocephalic head No cervical or supraclavicular lymphadenopathy appreciated increased work of breathing, but  no audible wheezes/rhonchi Regular sinus rhythm/rate Abdomen is morbidly obese  with a significant pannus. The patient has no suprapubic tenderness or erythema. 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 The skin around the area feels hard and indurated, but there is no erythema surrounding the area. Lower extremities is are  symmetric without appreciable edema Grossly neurologically intact No identifiable skin lesions  No results found for this basename: WBC, HGB, HCT,  in the last 72 hours  Recent Labs  04/08/13 0454 04/10/13 0446  NA 141 141  K 4.1 4.1  CL 102 104  CO2 27 28  GLUCOSE 228* 197*  BUN 11 9  CREATININE 0.83 0.73  CALCIUM 8.8 8.8   No results found for this basename: LABPT, INR,  in the last 72 hours No results found for this basename: LABURIN,  in the last 72 hours Results for orders placed during the hospital encounter of 04/06/13  WOUND CULTURE     Status: None   Collection Time    04/06/13  9:40 AM      Result Value Ref Range Status   Specimen Description PENIS   Final   Special Requests Normal   Final   Gram Stain     Final   Value: MODERATE WBC PRESENT, PREDOMINANTLY PMN     RARE SQUAMOUS EPITHELIAL CELLS PRESENT     MODERATE YEAST     Performed at [facility]   Culture     Final   Value: MODERATE CANDIDA ALBICANS     Performed at [facility]   Report Status 04/09/2013 FINAL   Final  MRSA PCR SCREENING     Status: None   Collection Time    04/09/13  8:45 AM      Result Value Ref Range Status   MRSA by PCR NEGATIVE  NEGATIVE Final   Comment:            The GeneXpert MRSA Assay (FDA     approved for NASAL specimens     only), is one component of a     comprehensive MRSA colonization     surveillance program. It is not     intended to diagnose MRSA     infection nor to guide or     monitor treatment for     MRSA infections.    Imaging: None   Imp: Penile foreskin cellulitis.  Recommendations:  I recommended that the  patient have a Foley catheter placed to prevent irritation from urine and worsening of his symptoms. This may need to stay in place for a week or so while the surrounding tissue heels. I would continue the IV antibiotics for now, it would be nice to switch him to something  oral if at all possible in the next few days. I am debating whether or not to start topical steroids in the area to reduce the inflammation, but will hold off for now. The patient does not need to be taken to the OR for any exploration at this time.   Thank you for involving me in this patient's care, I will continue to follow along.  Marland Kitchen.Crist FatBenjamin W Volanda Mangine  Addendum: After struggling to place a foley catheter and manipulating his edematous foreskin it became more clear to me that the patient has lichen sclerosis that is super-infected.  Would recommend transitioning him to Augmentin and monitoring him for at least 24 hrs.  Hold off on steriods until his superinfection is treated.

## 2013-04-10 NOTE — Progress Notes (Signed)
ANTIBIOTIC CONSULT NOTE - follow up  Pharmacy Consult for Vancomycin & Zosyn Indication: cellulitis  Allergies  Allergen Reactions  . Biaxin [Clarithromycin] Shortness Of Breath   Patient Measurements: Height: 6' (182.9 cm) Weight: 401 lb 3.2 oz (181.983 kg) IBW/kg (Calculated) : 77.6  Vital Signs: Temp: 97.3 F (36.3 C) (04/08 0456) Temp src: Oral (04/08 0456) BP: 127/79 mmHg (04/08 0831) Pulse Rate: 76 (04/08 0831) Intake/Output from previous day: 04/07 0701 - 04/08 0700 In: 720 [P.O.:720] Out: -  Intake/Output from this shift: Total I/O In: 480 [P.O.:480] Out: -   Labs:  Recent Labs  04/08/13 0454 04/10/13 0446  CREATININE 0.83 0.73   Estimated Creatinine Clearance: 186.6 ml/min (by C-G formula based on Cr of 0.73). No results found for this basename: VANCOTROUGH, Leodis Binet, VANCORANDOM, GENTTROUGH, GENTPEAK, GENTRANDOM, TOBRATROUGH, TOBRAPEAK, TOBRARND, AMIKACINPEAK, AMIKACINTROU, AMIKACIN,  in the last 72 hours   Microbiology: Recent Results (from the past 720 hour(s))  WOUND CULTURE     Status: None   Collection Time    04/06/13  9:40 AM      Result Value Ref Range Status   Specimen Description PENIS   Final   Special Requests Normal   Final   Gram Stain     Final   Value: MODERATE WBC PRESENT, PREDOMINANTLY PMN     RARE SQUAMOUS EPITHELIAL CELLS PRESENT     MODERATE YEAST     Performed at [facility]   Culture     Final   Value: MODERATE CANDIDA ALBICANS     Performed at [facility]   Report Status 04/09/2013 FINAL   Final  MRSA PCR SCREENING     Status: None   Collection Time    04/09/13  8:45 AM      Result Value Ref Range Status   MRSA by PCR NEGATIVE  NEGATIVE Final   Comment:            The GeneXpert MRSA Assay (FDA     approved for NASAL specimens     only), is one component of a     comprehensive MRSA colonization     surveillance program. It is not     intended to diagnose MRSA     infection nor to guide or   monitor treatment for     MRSA infections.   Medical History: Past Medical History  Diagnosis Date  . Diabetes mellitus without complication   . Asthma   . COPD (chronic obstructive pulmonary disease)   . Hypertension   . Gout   . Sleep apnea    Medications:  Scheduled:  . allopurinol  300 mg Oral Daily  . ALPRAZolam  1 mg Oral QID  . benazepril  5 mg Oral Daily  . enoxaparin (LOVENOX) injection  80 mg Subcutaneous Q24H  . HYDROcodone-acetaminophen  1 tablet Oral QID  . indomethacin  50 mg Oral Q breakfast  . insulin aspart  0-20 Units Subcutaneous TID WC  . insulin aspart  0-5 Units Subcutaneous QHS  . insulin aspart  4 Units Subcutaneous TID WC  . insulin glargine  70 Units Subcutaneous QHS  . linagliptin  5 mg Oral Daily  . metFORMIN  1,000 mg Oral BID WC  . multivitamin with minerals  1 tablet Oral Daily  . nystatin-triamcinolone   Topical TID  . piperacillin-tazobactam (ZOSYN)  IV  3.375 g Intravenous Q8H  . simvastatin  5 mg Oral q1800  . tiotropium  18 mcg Inhalation Daily  .  vancomycin  1,500 mg Intravenous Q12H  . verapamil  240 mg Oral Daily   Assessment: 53 yo obese M who presents with rash & swelling of penis region.  There is drainage per notes.  He has been on cephalexin as an outpatient with no improvement.  Renal function is at patient's baseline.  Moderate Candida growing from wound culture. Pt was going to OR 4/7 for circumcision but was canceled due to issues with anesthesia.  Working on transfer to higher level of care per MD.  Afebrile, renal fxn stable.    Vancomycin 4/4>> Zosyn 4/4>>  Goal of Therapy:  Vancomycin trough level 10-15 mcg/ml  Plan:   Zosyn 3.375gm IV Q8h to be infused over 4hrs  Vancomycin 1500mg  IV q12h  Check Vancomycin trough tomorrow before 2pm dose  Monitor renal function and cx data   KeySpanScott A Doraine Schexnider 04/10/2013,11:39 AM

## 2013-04-10 NOTE — Procedures (Signed)
Pre-procedure diagnosis: difficult foley catheterization Post-procedure diagnosis: as above  Procedure performed: placement of complicated foley  Surgeon: Dr. Ardis Hughs  Findings: buried penis, balanitis secondarily infected  Specimen: none  Drains: 23F foley  Indications:  Patient with significant ulceration of penile foreskin/buried penis.  Severe irritation of ulcers when voiding.  Procedure:  Gentials were prepped and draped in the routine sterile fashion.  10cc of 1% viscous lidocaine jelly was then injected into the patient's urethra.  A 23F catheter was then gently passed in to the urethral.  Resistance was met at the prostate, but with gentle pressue the catheter slid through the prostate and into the bladder.  Clear yellow urine was returned.  Patient tolerated the procedure well - no immediate issues.  Disposition: Voiding trial in 5-7 days.

## 2013-04-11 DIAGNOSIS — J449 Chronic obstructive pulmonary disease, unspecified: Secondary | ICD-10-CM

## 2013-04-11 DIAGNOSIS — I10 Essential (primary) hypertension: Secondary | ICD-10-CM

## 2013-04-11 DIAGNOSIS — N476 Balanoposthitis: Secondary | ICD-10-CM

## 2013-04-11 DIAGNOSIS — E119 Type 2 diabetes mellitus without complications: Secondary | ICD-10-CM

## 2013-04-11 LAB — GLUCOSE, CAPILLARY
GLUCOSE-CAPILLARY: 193 mg/dL — AB (ref 70–99)
GLUCOSE-CAPILLARY: 200 mg/dL — AB (ref 70–99)
Glucose-Capillary: 185 mg/dL — ABNORMAL HIGH (ref 70–99)
Glucose-Capillary: 229 mg/dL — ABNORMAL HIGH (ref 70–99)
Glucose-Capillary: 174 mg/dL — ABNORMAL HIGH (ref 70–99)

## 2013-04-11 MED ORDER — AMOXICILLIN-POT CLAVULANATE 875-125 MG PO TABS
1.0000 | ORAL_TABLET | Freq: Two times a day (BID) | ORAL | Status: DC
  Administered 2013-04-11 – 2013-04-12 (×3): 1 via ORAL
  Filled 2013-04-11 (×4): qty 1

## 2013-04-11 MED ORDER — NYSTATIN 100000 UNIT/GM EX CREA
TOPICAL_CREAM | Freq: Three times a day (TID) | CUTANEOUS | Status: DC
Start: 2013-04-11 — End: 2013-04-12
  Administered 2013-04-11 (×2): via TOPICAL
  Filled 2013-04-11: qty 15

## 2013-04-11 NOTE — Progress Notes (Signed)
53 year old male who I'm following for superinfection of his penile foreskin with lichen sclerosis and buried penis.  Interval: Foley catheter was placed last night to help the patient heals wound. He was transitioned from thank and Zosyn to Augmentin today. The patient states that he is feeling better, having less discomfort.  S: Filed Vitals:   04/11/13 1107 04/11/13 1226 04/11/13 1344 04/11/13 1536  BP:  156/83 122/68 147/71  Pulse:   72 68  Temp:   97.6 F (36.4 C) 98 F (36.7 C)  TempSrc:   Oral Oral  Resp:   19 20  Height:      Weight:      SpO2: 96%  93% 94%    Intake/Output Summary (Last 24 hours) at 04/11/13 1856 Last data filed at 04/11/13 1300  Gross per 24 hour  Intake   1250 ml  Output   2500 ml  Net  -1250 ml   No acute distress Foley catheter is draining clear yellow urine Penile foreskin appears to be softer this afternoon. There is no suprapubic erythema.  Impression: lichen sclerosis, balanitis obliterans, with a superinfection which appears to be improving. Recommendation: I spoke to the patient about keeping his Foley catheter for at least 7 days. The patient on the by mouth antibiotics for 2 weeks at least. I discussed followup for the patient has an outpatient, and I will arrange either a follow visit [place] in [place] or with one of our urologist at [facility].

## 2013-04-11 NOTE — Progress Notes (Signed)
[facility] PROGRESS NOTE  Aaron BihariGeorge Potter BJY:[id number] DOB: 01/26/1960 DOA: 04/06/2013  PCP: Fredirick MaudlinHAWKINS,EDWARD L, MD  Brief HPI: Pt is 53 yo male with HTN, DM, COPD, who presented to [facility] ED with main concern of several days duration of progressively worsening penile foreskin rash. He reports associated burning with urination and difficulty retracting foreskin, progressive swelling and redness, subjective fevers, chills. He has seen his PCP several days prior to admission and was given ABX shot, pt does not know the name of the ABX. His symptoms have not improved and he came to ED.   Past medical history:  Past Medical History  Diagnosis Date  . Diabetes mellitus without complication   . Asthma   . COPD (chronic obstructive pulmonary disease)   . Hypertension   . Gout   . Sleep apnea     Consultants: Urology  Procedures: Foley placement  Antibiotics: Vanc till 4/9 Zosyn till 4/9 Augmentin 4/9-->  Subjective: Patient feels better today. Not in as much pain after foley was placed. Denies nausea or vomiting.  Objective: Vital Signs  Filed Vitals:   04/10/13 2151 04/10/13 2247 04/11/13 0643 04/11/13 0920  BP: 138/54  148/72   Pulse: 82 71 74 66  Temp: 98.2 F (36.8 C)  98.4 F (36.9 C) 97.5 F (36.4 C)  TempSrc: Oral  Oral Oral  Resp: 20 20 22 20   Height:      Weight:      SpO2: 94% 96% 97% 98%    Intake/Output Summary (Last 24 hours) at 04/11/13 1033 Last data filed at 04/11/13 0920  Gross per 24 hour  Intake   1850 ml  Output   1500 ml  Net    350 ml   Filed Weights   04/06/13 0839 04/06/13 1303  Weight: 181.892 kg (401 lb) 181.983 kg (401 lb 3.2 oz)    General appearance: alert, cooperative, appears stated age, no distress and morbidly obese Resp: clear to auscultation bilaterally Cardio: regular rate and rhythm, S1, S2 normal, no murmur, click, rub or gallop GI: soft, non-tender; bowel sounds normal; no masses,  no organomegaly Male genitalia:  penis could not be fully examined. Foley noted. Skin irritation noted. Extremities: extremities normal, atraumatic, no cyanosis or edema Neurologic: Grossly normal  Lab Results:  Basic Metabolic Panel:  Recent Labs Lab 04/06/13 0919 04/07/13 0532 04/08/13 0454 04/10/13 0446  NA 136* 136* 141 141  K 4.2 4.6 4.1 4.1  CL 97 99 102 104  CO2 23 27 27 28   GLUCOSE 217* 196* 228* 197*  BUN 12 12 11 9   CREATININE 0.81 0.84 0.83 0.73  CALCIUM 9.5 8.8 8.8 8.8   CBC:  Recent Labs Lab 04/06/13 0919 04/07/13 0532  WBC 8.4 7.4  NEUTROABS 5.1  --   HGB 15.8 14.1  HCT 45.5 42.7  MCV 92.5 93.6  PLT 285 289   CBG:  Recent Labs Lab 04/10/13 1134 04/10/13 1649 04/10/13 2002 04/11/13 0010 04/11/13 0804  GLUCAP 185* 184* 216* 193* 229*    Recent Results (from the past 240 hour(s))  WOUND CULTURE     Status: None   Collection Time    04/06/13  9:40 AM      Result Value Ref Range Status   Specimen Description PENIS   Final   Special Requests Normal   Final   Gram Stain     Final   Value: MODERATE WBC PRESENT, PREDOMINANTLY PMN     RARE SQUAMOUS EPITHELIAL CELLS PRESENT  MODERATE YEAST     Performed at [facility]   Culture     Final   Value: MODERATE CANDIDA ALBICANS     Performed at [facility]   Report Status 04/09/2013 FINAL   Final  MRSA PCR SCREENING     Status: None   Collection Time    04/09/13  8:45 AM      Result Value Ref Range Status   MRSA by PCR NEGATIVE  NEGATIVE Final   Comment:            The GeneXpert MRSA Assay (FDA     approved for NASAL specimens     only), is one component of a     comprehensive MRSA colonization     surveillance program. It is not     intended to diagnose MRSA     infection nor to guide or     monitor treatment for     MRSA infections.      Studies/Results: No results found.  Medications:  Scheduled: . allopurinol  300 mg Oral Daily  . ALPRAZolam  1 mg Oral QID  . amoxicillin-clavulanate  1  tablet Oral Q12H  . benazepril  5 mg Oral Daily  . enoxaparin (LOVENOX) injection  80 mg Subcutaneous Q24H  . HYDROcodone-acetaminophen  1 tablet Oral QID  . indomethacin  50 mg Oral Q breakfast  . insulin aspart  0-20 Units Subcutaneous TID WC  . insulin aspart  0-5 Units Subcutaneous QHS  . insulin aspart  4 Units Subcutaneous TID WC  . insulin glargine  70 Units Subcutaneous QHS  . linagliptin  5 mg Oral Daily  . metFORMIN  1,000 mg Oral BID WC  . multivitamin with minerals  1 tablet Oral Daily  . nystatin-triamcinolone   Topical TID  . simvastatin  5 mg Oral q1800  . tiotropium  18 mcg Inhalation Daily  . verapamil  240 mg Oral Daily   Continuous:  BJY:[id number], HYDROcodone-acetaminophen, morphine injection, ondansetron (ZOFRAN) IV, ondansetron, zolpidem  Assessment/Plan:  Active Problems:   Cellulitis   Essential hypertension, benign   Type II or unspecified type diabetes mellitus without mention of complication, not stated as uncontrolled   Morbid obesity   Balanitis   COPD (chronic obstructive pulmonary disease)   Obstructive sleep apnea    Inflamed Penis and foreskin Appreciate Urology input. Could be Lichen Planus. Foley was placed last night to help with healing. Antibiotic recommendations also made. Will stop Vanc and Zosyn and initiate Augmentin. Recommending to stop steroidal cream. If he tolerates oral antibiotics, he will be discharged in AM. No plans for surgery currently.  Diabetes Mellitus Type 2 Continue current regimen. Patient follows with endocrinology in [place]. His last A1c was 8, per patient.  Benign Essential HTN Stable. Continue current medications.  Morbid Obesity Weight loss counseling as OP  History of COPD Stable.  History of OSA CPAP Qhs  Code Status: Full Code  DVT Prophylaxis: Enoxaparin    Family Communication: Discussed with patient and his sisters  Disposition Plan: Home likely 4/10.    LOS: 5 days   Osvaldo Shipper  [facility] Pager [phone] 04/11/2013, 10:33 AM  If 8PM-8AM, please contact night-coverage at [email or web address], password [facility]

## 2013-04-12 DIAGNOSIS — N480 Leukoplakia of penis: Secondary | ICD-10-CM | POA: Diagnosis present

## 2013-04-12 LAB — BASIC METABOLIC PANEL
BUN: 10 mg/dL (ref 6–23)
CO2: 27 mEq/L (ref 19–32)
CREATININE: 0.71 mg/dL (ref 0.50–1.35)
Calcium: 9.4 mg/dL (ref 8.4–10.5)
Chloride: 104 mEq/L (ref 96–112)
GFR calc Af Amer: >90 mL/min (ref >90)
Glucose, Bld: 175 mg/dL — ABNORMAL HIGH (ref 70–99)
Potassium: 4.6 mEq/L (ref 3.7–5.3)
SODIUM: 141 meq/L (ref 137–147)

## 2013-04-12 LAB — CBC
HEMATOCRIT: 39.2 % (ref 39.0–52.0)
Hemoglobin: 13 g/dL (ref 13.0–17.0)
MCH: 30.3 pg (ref 26.0–34.0)
MCHC: 33.2 g/dL (ref 30.0–36.0)
MCV: 91.4 fL (ref 78.0–100.0)
Platelets: 287 10*3/uL (ref 150–400)
RBC: 4.29 MIL/uL (ref 4.22–5.81)
RDW: 13.6 % (ref 11.5–15.5)
WBC: 9.3 10*3/uL (ref 4.0–10.5)

## 2013-04-12 LAB — GLUCOSE, CAPILLARY: Glucose-Capillary: 143 mg/dL — ABNORMAL HIGH (ref 70–99)

## 2013-04-12 MED ORDER — AMOXICILLIN-POT CLAVULANATE 875-125 MG PO TABS: 1.0000 | ORAL_TABLET | Freq: Two times a day (BID) | ORAL | Status: DC

## 2013-04-12 MED ORDER — NYSTATIN 100000 UNIT/GM EX CREA: TOPICAL_CREAM | Freq: Three times a day (TID) | CUTANEOUS | Status: DC

## 2013-04-12 NOTE — Progress Notes (Signed)
Discharge instructions given to pt, verbalized understanding. Left the unit in stable condition. 

## 2013-04-12 NOTE — Discharge Instructions (Signed)
Lichen Sclerosus Lichen sclerosus is a skin problem. It can happen on any part of the body, but it commonly involves the anal or genital areas. Lichen sclerosus is not an infection or a fungus. Girls and women are more commonly affected than boys and men. CAUSES The cause is not known. It could be the result of an overactive immune system or a lack of certain hormones. Lichen sclerosus is not passed from one person to another (not contagious). SYMPTOMS Your skin may have:  Thin, wrinkled, white areas.  Thickened white areas.  Red and swollen patches.  Tears or cracks.  Bruising.  Blood blisters.  Severe itching. You may also have pain, itching, or burning with urination. Constipation is also common in people with lichen sclerosus. DIAGNOSIS Your caregiver will do a physical exam. Sometimes, a tissue sample (biopsy) may be sent for testing. TREATMENT Treatment may involve putting a thin layer of medicated cream (topical steroid) over the areas with lichen sclerosus. Use the cream only as directed by your caregiver.  HOME CARE INSTRUCTIONS  Only take over-the-counter or prescription medicines as directed by your caregiver.  Keep the vaginal area as clean and dry as possible. SEEK MEDICAL CARE IF: You develop increasing pain, swelling, or redness. Document Released: 05/12/2010 Document Revised: 03/14/2011 Document Reviewed: 05/12/2010 [facility] Patient Information 2014 [place], [place].   Foley Catheter Care, Adult A Foley catheter is a soft, flexible tube that is placed into the bladder to drain urine. A Foley catheter may be inserted if:  You leak urine or are not able to control when you urinate (urinary incontinence).  You are not able to urinate when you need to (urinary retention).  You had prostate surgery or surgery on the genitals.  You have certain medical conditions, such as multiple sclerosis, dementia, or a spinal cord injury. If you are going home with a Foley  catheter in place, follow the instructions below. TAKING CARE OF THE CATHETER 1. Wash your hands with soap and water. 2. Using mild soap and warm water on a clean washcloth:  Clean the area on your body closest to the catheter insertion site using a circular motion, moving away from the catheter. Never wipe toward the catheter because this could sweep bacteria up into the urethra and cause infection.  Remove all traces of soap. Pat the area dry with a clean towel. For males, reposition the foreskin. 3. Attach the catheter to your leg so there is no tension on the catheter. Use adhesive tape or a leg strap. If you are using adhesive tape, remove any sticky residue left behind by the previous tape you used. 4. Keep the drainage bag below the level of the bladder, but keep it off the floor. 5. Check throughout the day to be sure the catheter is working and urine is draining freely. Make sure the tubing does not become kinked. 6. Do not pull on the catheter or try to remove it. Pulling could damage internal tissues. TAKING CARE OF THE DRAINAGE BAGS You will be given two drainage bags to take home. One is a large overnight drainage bag, and the other is a smaller leg bag that fits underneath clothing. You may wear the overnight bag at any time, but you should never wear the smaller leg bag at night. Follow the instructions below for how to empty, change, and clean your drainage bags. Emptying the Drainage Bag You must empty your drainage bag when it is    full or at least 2  3 times a day. 1. Wash your hands with soap and water. 2. Keep the drainage bag below your hips, below the level of your bladder. This stops urine from going back into the tubing and into your bladder. 3. Hold the dirty bag over the toilet or a clean container. 4. Open the pour spout at the bottom of the bag and empty the urine into the toilet or container. Do not let the pour spout touch the toilet, container, or any other  surface. Doing so can place bacteria on the bag, which can cause an infection. 5. Clean the pour spout with a gauze pad or cotton ball that has rubbing alcohol on it. 6. Close the pour spout. 7. Attach the bag to your leg with adhesive tape or a leg strap. 8. Wash your hands well. Changing the Drainage Bag Change your drainage bag once a month or sooner if it starts to smell bad or look dirty. Below are steps to follow when changing the drainage bag. 1. Wash your hands with soap and water. 2. Pinch off the rubber catheter so that urine does not spill out. 3. Disconnect the catheter tube from the drainage tube at the connection valve. Do not let the tubes touch any surface. 4. Clean the end of the catheter tube with an alcohol wipe. Use a different alcohol wipe to clean the end of the drainage tube. 5. Connect the catheter tube to the drainage tube of the clean drainage bag. 6. Attach the new bag to the leg with adhesive tape or a leg strap. Avoid attaching the new bag too tightly. 7. Wash your hands well. Cleaning the Drainage Bag 1. Wash your hands with soap and water. 2. Wash the bag in warm, soapy water. 3. Rinse the bag thoroughly with warm water. 4. Fill the bag with a solution of white vinegar and water (1 cup vinegar to 1 qt warm water [.2 L vinegar to 1 L warm water]). Close the bag and soak it for 30 minutes in the solution. 5. Rinse the bag with warm water. 6. Hang the bag to dry with the pour spout open and hanging downward. 7. Store the clean bag (once it is dry) in a clean plastic bag. 8. Wash your hands well. PREVENTING INFECTION  Wash your hands before and after handling your catheter.  Take showers daily and wash the area where the catheter enters your body. Do not take baths. Replace wet leg straps with dry ones, if this applies.  Do not use powders, sprays, or lotions on the genital area. Only use creams, lotions, or ointments as directed by your caregiver.  For  females, wipe from front to back after each bowel movement.  Drink enough fluids to keep your urine clear or pale yellow unless you have a fluid restriction.  Do not let the drainage bag or tubing touch or lie on the floor.  Wear cotton underwear to absorb moisture and to keep your skin drier. SEEK MEDICAL CARE IF:   Your urine is cloudy or smells unusually bad.  Your catheter becomes clogged.  You are not draining urine into the bag or your bladder feels full.  Your catheter starts to leak. SEEK IMMEDIATE MEDICAL CARE IF:   You have pain, swelling, redness, or pus where the catheter enters the body.  You have pain in the abdomen, legs, lower back, or bladder.  You have a fever.  You see blood fill the catheter, or your urine is pink or  red.  You have nausea, vomiting, or chills.  Your catheter gets pulled out. MAKE SURE YOU:   Understand these instructions.  Will watch your condition.  Will get help right away if you are not doing well or get worse. Document Released: 12/20/2004 Document Revised: 04/16/2012 Document Reviewed: 12/12/2011 [facility] Patient Information 2014 [place], [place].

## 2013-04-12 NOTE — Progress Notes (Signed)
53 year old male who I'm following for superinfection of his penile foreskin with lichen sclerosis and buried penis.  Interval: Transitioned to Augmentin No issues overnight  S: Filed Vitals:   04/11/13 1344 04/11/13 1536 04/11/13 2229 04/12/13 0654  BP: 122/68 147/71 157/78 127/76  Pulse: 72 68 68 63  Temp: 97.6 F (36.4 C) 98 F (36.7 C) 98.5 F (36.9 C) 97.5 F (36.4 C)  TempSrc: Oral Oral Oral Oral  Resp: 19 20 20 18   Height:      Weight:      SpO2: 93% 94% 96% 96%    Intake/Output Summary (Last 24 hours) at 04/12/13 1031 Last data filed at 04/12/13 0655  Gross per 24 hour  Intake    590 ml  Output   3950 ml  Net  -3360 ml   No acute distress Foley catheter is draining clear yellow urine Penile foreskin appears to be softer. There is no suprapubic erythema.  Impression: lichen sclerosis, balanitis obliterans, with a superinfection which appears to be improving. Recommendation:  abx x 2 weeks D/c with foley F/u scheduled for 10 days.

## 2013-04-12 NOTE — Discharge Summary (Signed)
[facility]  Physician Discharge Summary   Patient ID: Aaron Potter MRN: [id number] DOB/AGE: Jan 07, 1960 53 y.o.  Admit date: 04/06/2013 Discharge date: 04/12/2013  PCP: Fredirick Maudlin, MD  DISCHARGE DIAGNOSES:  Active Problems:   Cellulitis   Essential hypertension, benign   Type II or unspecified type diabetes mellitus without mention of complication, not stated as uncontrolled   Morbid obesity   Balanitis   COPD (chronic obstructive pulmonary disease)   Obstructive sleep apnea   Lichen sclerosus of penis   RECOMMENDATIONS FOR OUTPATIENT FOLLOW UP: 1. Foley to be left in for now. 2. Needs close follow up with Dr. Marlou Porch with Urology.  DISCHARGE CONDITION: fair  Diet recommendation: Mod Carb  Filed Weights   04/06/13 0839 04/06/13 1303  Weight: 181.892 kg (401 lb) 181.983 kg (401 lb 3.2 oz)    INITIAL HISTORY: Pt is 53 yo male with HTN, DM, COPD, who presented to [facility] ED with main concern of several days duration of progressively worsening penile foreskin rash. He reports associated burning with urination and difficulty retracting foreskin, progressive swelling and redness, subjective fevers, chills. He has seen his PCP several days prior to admission and was given ABX shot, pt does not know the name of the ABX. His symptoms have not improved and he came to ED.   Consultations:  Dr. Marlou Porch with Urology  Procedures:  Foley Catheterization  HOSPITAL COURSE:   Inflamed Penis and foreskin/Lichen Sclerosus Patient was transferred from [facility] as anesthesia felt he was too high risk for that facility. He was seen by Dr. Marlou Porch with Urology but was not thought to require immediate surgery. A foley was placed to allow that site to heal. It was also felt that this was lichen sclerosus. He was changed over to Augmentin orally. Patient is feeling much better. Foley will be left in for now and he can be discharged. Urology will arrange follow up for  voiding trial and management of penile lesion. Continue Nystatin topically as well.  Diabetes Mellitus Type 2  His Dm remained well controlled. Continue current regimen. Patient follows with endocrinology in [place]. His last A1c was 8, per patient.   Benign Essential HTN  Stable. Continue current medications.   Morbid Obesity  Weight loss counseling as OP   History of COPD  Stable.   History of OSA CPAP at night.  Overall patient feels better. He is tolerating orally. Foley is functioning well. He is stable for discharge.  PERTINENT LABS:  The results of significant diagnostics from this hospitalization (including imaging, microbiology, ancillary and laboratory) are listed below for reference.    Microbiology: Recent Results (from the past 240 hour(s))  WOUND CULTURE     Status: None   Collection Time    04/06/13  9:40 AM      Result Value Ref Range Status   Specimen Description PENIS   Final   Special Requests Normal   Final   Gram Stain     Final   Value: MODERATE WBC PRESENT, PREDOMINANTLY PMN     RARE SQUAMOUS EPITHELIAL CELLS PRESENT     MODERATE YEAST     Performed at [facility]   Culture     Final   Value: MODERATE CANDIDA ALBICANS     Performed at [facility]   Report Status 04/09/2013 FINAL   Final  MRSA PCR SCREENING     Status: None   Collection Time    04/09/13  8:45 AM  Result Value Ref Range Status   MRSA by PCR NEGATIVE  NEGATIVE Final   Comment:            The GeneXpert MRSA Assay (FDA     approved for NASAL specimens     only), is one component of a     comprehensive MRSA colonization     surveillance program. It is not     intended to diagnose MRSA     infection nor to guide or     monitor treatment for     MRSA infections.     Labs: Basic Metabolic Panel:  Recent Labs Lab 04/06/13 0919 04/07/13 0532 04/08/13 0454 04/10/13 0446 04/12/13 0520  NA 136* 136* 141 141 141  K 4.2 4.6 4.1 4.1 4.6  CL 97 99 102  104 104  CO2 23 27 27 28 27   GLUCOSE 217* 196* 228* 197* 175*  BUN 12 12 11 9 10   CREATININE 0.81 0.84 0.83 0.73 0.71  CALCIUM 9.5 8.8 8.8 8.8 9.4   CBC:  Recent Labs Lab 04/06/13 0919 04/07/13 0532 04/12/13 0520  WBC 8.4 7.4 9.3  NEUTROABS 5.1  --   --   HGB 15.8 14.1 13.0  HCT 45.5 42.7 39.2  MCV 92.5 93.6 91.4  PLT 285 289 287   CBG:  Recent Labs Lab 04/11/13 0010 04/11/13 0804 04/11/13 1135 04/11/13 1711 04/12/13 0740  GLUCAP 193* 229* 200* 174* 143*    DISCHARGE EXAMINATION: Filed Vitals:   04/11/13 1344 04/11/13 1536 04/11/13 2229 04/12/13 0654  BP: 122/68 147/71 157/78 127/76  Pulse: 72 68 68 63  Temp: 97.6 F (36.4 C) 98 F (36.7 C) 98.5 F (36.9 C) 97.5 F (36.4 C)  TempSrc: Oral Oral Oral Oral  Resp: 19 20 20 18   Height:      Weight:      SpO2: 93% 94% 96% 96%   General appearance: alert, cooperative, appears stated age, no distress and morbidly obese Resp: clear to auscultation bilaterally Cardio: regular rate and rhythm, S1, S2 normal, no murmur, click, rub or gallop GI: soft, non-tender; bowel sounds normal; no masses,  no organomegaly Neurologic: Alert and oriented X 3, normal strength and tone. Normal symmetric reflexes. Normal coordination and gait  DISPOSITION: Home with family  Discharge Orders   Future Orders Complete By Expires   Diet Carb Modified  As directed    Discharge instructions  As directed    Increase activity slowly  As directed       ALLERGIES:  Allergies  Allergen Reactions  . Biaxin [Clarithromycin] Shortness Of Breath    Current Discharge Medication List    START taking these medications   Details  amoxicillin-clavulanate (AUGMENTIN) 875-125 MG per tablet Take 1 tablet by mouth every 12 (twelve) hours. Qty: 28 tablet, Refills: 0    nystatin cream (MYCOSTATIN) Apply topically 3 (three) times daily. Qty: 30 g, Refills: 0      CONTINUE these medications which have NOT CHANGED   Details  albuterol  (PROVENTIL HFA;VENTOLIN HFA) 108 (90 BASE) MCG/ACT inhaler Inhale 2 puffs into the lungs 2 (two) times daily as needed for wheezing or shortness of breath.    albuterol (PROVENTIL) (2.5 MG/3ML) 0.083% nebulizer solution Take 2.5 mg by nebulization every 6 (six) hours as needed for wheezing or shortness of breath.     allopurinol (ZYLOPRIM) 300 MG tablet Take 300 mg by mouth daily.      ALPRAZolam (XANAX) 1 MG tablet Take 1 mg by mouth  4 (four) times daily.      benazepril (LOTENSIN) 5 MG tablet Take 5 mg by mouth daily.    HYDROcodone-acetaminophen (NORCO) 10-325 MG per tablet Take 1 tablet by mouth 4 (four) times daily.    indomethacin (INDOCIN) 50 MG capsule Take 50 mg by mouth 1 day or 1 dose.      insulin aspart (NOVOLOG FLEXPEN) 100 UNIT/ML FlexPen Inject 10-14 Units into the skin 3 (three) times daily with meals.    insulin glargine (LANTUS) 100 UNIT/ML injection Inject 70 Units into the skin at bedtime.    Liraglutide (VICTOZA) 18 MG/3ML SOPN Inject 1.8 Units into the skin daily.    metFORMIN (GLUCOPHAGE) 500 MG tablet Take 1,000 mg by mouth 2 (two) times daily with a meal.     Multiple Vitamin (MULTIVITAMIN WITH MINERALS) TABS tablet Take 1 tablet by mouth daily.    pravastatin (PRAVACHOL) 40 MG tablet Take 40 mg by mouth daily.    saxagliptin HCl (ONGLYZA) 5 MG TABS tablet Take 5 mg by mouth daily.    tiotropium (SPIRIVA) 18 MCG inhalation capsule Place 18 mcg into inhaler and inhale daily.    verapamil (CALAN-SR) 240 MG CR tablet Take 240 mg by mouth daily.    Vitamin D, Ergocalciferol, (DRISDOL) 50000 UNITS CAPS capsule Take 50,000 Units by mouth every 7 (seven) days. On Tuesday's.    zolpidem (AMBIEN) 10 MG tablet Take 10 mg by mouth at bedtime as needed for sleep.       STOP taking these medications     cephALEXin (KEFLEX) 250 MG capsule        Follow-up Information   Follow up with Crist FatHERRICK, BENJAMIN W, MD. (His office will call for appt. If you dont hear  anything in a few days, please call.)    Specialty:  Urology   Contact information:   [address] [facility]  [place] [address] [phone]       Call HAWKINS,EDWARD L, MD. (As needed)    Specialty:  Pulmonary Disease   Contact information:   [address] [place] [address] [phone]       TOTAL DISCHARGE TIME: 35 mins  Osvaldo ShipperGokul Phuong Moffatt  [facility] Pager [phone]  04/12/2013, 8:52 AM

## 2013-04-12 NOTE — Progress Notes (Signed)
Upon arrival pt self administered nighttime CPAP, tolerating well. RT will assist as needed.

## 2013-04-15 LAB — GLUCOSE, CAPILLARY: Glucose-Capillary: 143 mg/dL — ABNORMAL HIGH (ref 70–99)

## 2013-04-26 ENCOUNTER — Encounter (HOSPITAL_COMMUNITY): Payer: Self-pay | Admitting: Emergency Medicine

## 2013-04-26 ENCOUNTER — Emergency Department (HOSPITAL_COMMUNITY)
Admission: EM | Admit: 2013-04-26 | Discharge: 2013-04-26 | Disposition: A | Discharge status: Home / Self Care | Payer: Medicaid Other | Attending: Emergency Medicine | Admitting: Emergency Medicine

## 2013-04-26 DIAGNOSIS — J4489 Other specified chronic obstructive pulmonary disease: Secondary | ICD-10-CM | POA: Insufficient documentation

## 2013-04-26 DIAGNOSIS — Z872 Personal history of diseases of the skin and subcutaneous tissue: Secondary | ICD-10-CM | POA: Insufficient documentation

## 2013-04-26 DIAGNOSIS — I10 Essential (primary) hypertension: Secondary | ICD-10-CM | POA: Insufficient documentation

## 2013-04-26 DIAGNOSIS — Z792 Long term (current) use of antibiotics: Secondary | ICD-10-CM | POA: Insufficient documentation

## 2013-04-26 DIAGNOSIS — Z791 Long term (current) use of non-steroidal anti-inflammatories (NSAID): Secondary | ICD-10-CM | POA: Insufficient documentation

## 2013-04-26 DIAGNOSIS — IMO0002 Reserved for concepts with insufficient information to code with codable children: Secondary | ICD-10-CM | POA: Insufficient documentation

## 2013-04-26 DIAGNOSIS — E119 Type 2 diabetes mellitus without complications: Secondary | ICD-10-CM | POA: Insufficient documentation

## 2013-04-26 DIAGNOSIS — Z87448 Personal history of other diseases of urinary system: Secondary | ICD-10-CM | POA: Insufficient documentation

## 2013-04-26 DIAGNOSIS — R209 Unspecified disturbances of skin sensation: Secondary | ICD-10-CM | POA: Insufficient documentation

## 2013-04-26 DIAGNOSIS — Z79899 Other long term (current) drug therapy: Secondary | ICD-10-CM | POA: Insufficient documentation

## 2013-04-26 DIAGNOSIS — G4730 Sleep apnea, unspecified: Secondary | ICD-10-CM | POA: Insufficient documentation

## 2013-04-26 DIAGNOSIS — M109 Gout, unspecified: Secondary | ICD-10-CM | POA: Insufficient documentation

## 2013-04-26 DIAGNOSIS — Z794 Long term (current) use of insulin: Secondary | ICD-10-CM | POA: Insufficient documentation

## 2013-04-26 DIAGNOSIS — R202 Paresthesia of skin: Secondary | ICD-10-CM

## 2013-04-26 DIAGNOSIS — J449 Chronic obstructive pulmonary disease, unspecified: Secondary | ICD-10-CM | POA: Insufficient documentation

## 2013-04-26 HISTORY — DX: Balanitis: N48.1

## 2013-04-26 HISTORY — DX: Lichen sclerosus et atrophicus: L90.0

## 2013-04-26 HISTORY — DX: Cellulitis, unspecified: L03.90

## 2013-04-26 NOTE — ED Notes (Signed)
MRI reports that pt is too large to fit in MRI scanner and that the weight limit is 300 lbs. Notified Dr Oletta CohnPolina that pt will not fit. Called [facility], they stated that pt may fit, but will not know until they get him on table, and they are unable to complete today, but can do tomorrow. Dr Oletta CohnPolina notified

## 2013-04-26 NOTE — ED Notes (Addendum)
Pt from home c/o numbness to bottom that radiates around to abdomen. Pt denies pain. Pt is A&O and in NAD. Pt lung sounds clear and last BM this am

## 2013-04-26 NOTE — Discharge Instructions (Signed)
Paresthesia °Paresthesia is an abnormal burning or prickling sensation. This sensation is generally felt in the hands, arms, legs, or feet. However, it may occur in any part of the body. It is usually not painful. The feeling may be described as: °· Tingling or numbness. °· "Pins and needles." °· Skin crawling. °· Buzzing. °· Limbs "falling asleep." °· Itching. °Most people experience temporary (transient) paresthesia at some time in their lives. °CAUSES  °Paresthesia may occur when you breathe too quickly (hyperventilation). It can also occur without any apparent cause. Commonly, paresthesia occurs when pressure is placed on a nerve. The feeling quickly goes away once the pressure is removed. For some people, however, paresthesia is a long-lasting (chronic) condition caused by an underlying disorder. The underlying disorder may be: °· A traumatic, direct injury to nerves. Examples include a: °· Broken (fractured) neck. °· Fractured skull. °· A disorder affecting the brain and spinal cord (central nervous system). Examples include: °· Transverse myelitis. °· Encephalitis. °· Transient ischemic attack. °· Multiple sclerosis. °· Stroke. °· Tumor or blood vessel problems, such as an arteriovenous malformation pressing against the brain or spinal cord. °· A condition that damages the peripheral nerves (peripheral neuropathy). Peripheral nerves are not part of the brain and spinal cord. These conditions include: °· Diabetes. °· Peripheral vascular disease. °· Nerve entrapment syndromes, such as carpal tunnel syndrome. °· Shingles. °· Hypothyroidism. °· Vitamin B12 deficiencies. °· Alcoholism. °· Heavy metal poisoning (lead, arsenic). °· Rheumatoid arthritis. °· Systemic lupus erythematosus. °DIAGNOSIS  °Your caregiver will attempt to find the underlying cause of your paresthesia. Your caregiver may: °· Take your medical history. °· Perform a physical exam. °· Order various lab tests. °· Order imaging tests. °TREATMENT    °Treatment for paresthesia depends on the underlying cause. °HOME CARE INSTRUCTIONS °· Avoid drinking alcohol. °· You may consider massage or acupuncture to help relieve your symptoms. °· Keep all follow-up appointments as directed by your caregiver. °SEEK IMMEDIATE MEDICAL CARE IF:  °· You feel weak. °· You have trouble walking or moving. °· You have problems with speech or vision. °· You feel confused. °· You cannot control your bladder or bowel movements. °· You feel numbness after an injury. °· You faint. °· Your burning or prickling feeling gets worse when walking. °· You have pain, cramps, or dizziness. °· You develop a rash. °MAKE SURE YOU: °· Understand these instructions. °· Will watch your condition. °· Will get help right away if you are not doing well or get worse. °Document Released: 12/10/2001 Document Revised: 03/14/2011 Document Reviewed: 09/10/2010 °[employer]® Patient Information ©2014 [employer]. ° °

## 2013-04-26 NOTE — ED Notes (Addendum)
Pt family came to desk requesting information. Was notified that we are unable to accommodate pt for MRI. Talked to pt and notified that we are unable to accommodate him for the MRI

## 2013-04-26 NOTE — ED Notes (Signed)
Pt denies claustrophobia. States he does not have any metal implants.

## 2013-04-26 NOTE — ED Provider Notes (Signed)
CSN: [id number]     Arrival date & time 04/26/13  1201 History   First MD Initiated Contact with Patient 04/26/13 1222     Chief Complaint  Patient presents with  . numbness in groin area, DM, recently admitted      (Consider location/radiation/quality/duration/timing/severity/associated sxs/prior Treatment) HPI Comments: Patient presents to the ER for evaluation of numbness in the groin area. Patient reports that he was recently hospitalized for an infection of his penis. He went home approximately 2 weeks ago. He says he has been mostly lying around, not very active. Her last week or so he has noticed the numbness in the back and groin area. He reports that the numbness is mostly in the contact areas where he sits and lays down. It improves if he stands up. There is no back pain. No numbness, tingling or weakness in the legs. He denies has an injury. It of previous infection is doing well, now repeat swelling or redness.   Past Medical History  Diagnosis Date  . Diabetes mellitus without complication   . Asthma   . COPD (chronic obstructive pulmonary disease)   . Hypertension   . Gout   . Sleep apnea   . Balanitis   . Cellulitis   . Lichen sclerosus    Past Surgical History  Procedure Laterality Date  . Cholecystectomy     History reviewed. No pertinent family history. History  Substance Use Topics  . Smoking status: Never Smoker   . Smokeless tobacco: Not on file  . Alcohol Use: No    Review of Systems  Neurological: Positive for numbness.  All other systems reviewed and are negative.     Allergies  Biaxin  Home Medications   Prior to Admission medications   Medication Sig Start Date End Date Taking? Authorizing Provider  albuterol (PROVENTIL HFA;VENTOLIN HFA) 108 (90 BASE) MCG/ACT inhaler Inhale 2 puffs into the lungs 2 (two) times daily as needed for wheezing or shortness of breath.   Yes Historical Provider, MD  albuterol (PROVENTIL) (2.5 MG/3ML) 0.083%  nebulizer solution Take 2.5 mg by nebulization every 6 (six) hours as needed for wheezing or shortness of breath.    Yes Historical Provider, MD  allopurinol (ZYLOPRIM) 300 MG tablet Take 300 mg by mouth daily.     Yes Historical Provider, MD  ALPRAZolam Prudy Feeler(XANAX) 1 MG tablet Take 1 mg by mouth 4 (four) times daily.     Yes Historical Provider, MD  amoxicillin-clavulanate (AUGMENTIN) 875-125 MG per tablet Take 1 tablet by mouth every 12 (twelve) hours. 04/12/13  Yes Osvaldo ShipperGokul Krishnan, MD  benazepril (LOTENSIN) 5 MG tablet Take 5 mg by mouth daily.   Yes Historical Provider, MD  clotrimazole-betamethasone (LOTRISONE) cream Apply 1 application topically 3 (three) times daily.   Yes Historical Provider, MD  HYDROcodone-acetaminophen (NORCO) 10-325 MG per tablet Take 1 tablet by mouth 4 (four) times daily.   Yes Historical Provider, MD  indomethacin (INDOCIN) 50 MG capsule Take 50 mg by mouth daily.    Yes Historical Provider, MD  insulin aspart (NOVOLOG FLEXPEN) 100 UNIT/ML FlexPen Inject 10-14 Units into the skin 3 (three) times daily with meals. On sliding scale   Yes Historical Provider, MD  insulin glargine (LANTUS) 100 UNIT/ML injection Inject 70 Units into the skin at bedtime.   Yes Historical Provider, MD  Liraglutide (VICTOZA) 18 MG/3ML SOPN Inject 1.8 Units into the skin daily.   Yes Historical Provider, MD  metFORMIN (GLUCOPHAGE) 500 MG tablet Take 1,000 mg by  mouth 2 (two) times daily with a meal.    Yes Historical Provider, MD  Multiple Vitamin (MULTIVITAMIN WITH MINERALS) TABS tablet Take 1 tablet by mouth daily.   Yes Historical Provider, MD  pravastatin (PRAVACHOL) 40 MG tablet Take 40 mg by mouth daily.   Yes Historical Provider, MD  saxagliptin HCl (ONGLYZA) 5 MG TABS tablet Take 5 mg by mouth daily.   Yes Historical Provider, MD  tiotropium (SPIRIVA) 18 MCG inhalation capsule Place 18 mcg into inhaler and inhale daily.   Yes Historical Provider, MD  verapamil (CALAN-SR) 240 MG CR tablet Take  240 mg by mouth daily.   Yes Historical Provider, MD  Vitamin D, Ergocalciferol, (DRISDOL) 50000 UNITS CAPS capsule Take 50,000 Units by mouth every 7 (seven) days. On Tuesday's.   Yes Historical Provider, MD  zolpidem (AMBIEN) 10 MG tablet Take 10 mg by mouth at bedtime as needed for sleep.    Yes Historical Provider, MD   BP 142/74  Pulse 88  Temp(Src) 97.9 F (36.6 C)  Resp 16  SpO2 97% Physical Exam  Constitutional: He is oriented to person, place, and time. He appears well-developed and well-nourished. No distress.  HENT:  Head: Normocephalic and atraumatic.  Right Ear: Hearing normal.  Left Ear: Hearing normal.  Nose: Nose normal.  Mouth/Throat: Oropharynx is clear and moist and mucous membranes are normal.  Eyes: Conjunctivae and EOM are normal. Pupils are equal, round, and reactive to light.  Neck: Normal range of motion. Neck supple.  Cardiovascular: Regular rhythm, S1 normal and S2 normal.  Exam reveals no gallop and no friction rub.   No murmur heard. Pulmonary/Chest: Effort normal and breath sounds normal. No respiratory distress. He exhibits no tenderness.  Abdominal: Soft. Normal appearance and bowel sounds are normal. There is no hepatosplenomegaly. There is no tenderness. There is no rebound, no guarding, no tenderness at McBurney's point and negative Murphy's sign. No hernia.  Genitourinary: Uncircumcised. No penile erythema.  Musculoskeletal: Normal range of motion.  Neurological: He is alert and oriented to person, place, and time. He has normal strength. No cranial nerve deficit or sensory deficit. Coordination normal. GCS eye subscore is 4. GCS verbal subscore is 5. GCS motor subscore is 6.  Skin: Skin is warm, dry and intact. No rash noted. No cyanosis.  Psychiatric: He has a normal mood and affect. His speech is normal and behavior is normal. Thought content normal.    ED Course  Procedures (including critical care time) Labs Review Labs Reviewed - No data to  display  Imaging Review No results found.   EKG Interpretation None      MDM   Final diagnoses:  Paresthesia    Patient presented with complaints of numbness and tingling in the groin area. He was recently treated for penile/foreskin cellulitis. Evaluation of this area does not show any concerning signs for repeat infection. He subjectively feels numb and tingling in the buttock region and groin. He does have sensation preserved on examination, however. There is no back pain. He has normal strength and sensation in the legs distal to this region. He has not had any urinary or bowel incontinence, no urinary retention.  I discussed with him the possibility of numbness and tingling secondary to his body habitus, lying around and possible peripheral neuropathy. I did, however, consider the possibility of central neuropathy, specifically lesion in the spinal cord. I was attempting to perform MRI on the patient, but he is too large for MRI scanner here or  at [facility]. My low suspicion is not high enough to Pursue Either Waymon Amato I discussed this with the patient. He is anxious to be discharged home and does not wish to be in the ER any longer and I feel that he is appropriate for followup with his primary doctor.    Gilda Crease, MD 04/26/13 [phone]

## 2013-04-26 NOTE — ED Notes (Signed)
Pt and wife advised that if pt gets worse and needs emergent tx for pain or numbness, [facility] should [facility] MRI machine larger and more likely to accommodate him per Dr. Oletta CohnPolina

## 2013-04-26 NOTE — ED Notes (Signed)
Pt was admitted to hospital on 4/3 for lichen sclerosus of penis, discharged on 4/10. Reports the next week his groin and buttocks area started going numb and tingling, thought it was from laying in the hospital bed. Went to see Dr Lockie ParesHendrick, urology on 4/20 to get foley catheter removed, did not mention numbness. Now pt reports all of groin and buttocks in numb and tingling. Denies pain.

## 2013-11-19 DIAGNOSIS — N4883 Acquired buried penis: Secondary | ICD-10-CM | POA: Insufficient documentation

## 2013-12-06 ENCOUNTER — Encounter: Payer: Medicaid Other | Attending: "Endocrinology | Admitting: Nutrition

## 2013-12-06 ENCOUNTER — Encounter: Payer: Self-pay | Admitting: Nutrition

## 2013-12-06 VITALS — Ht 72.0 in | Wt >= 6400 oz

## 2013-12-06 DIAGNOSIS — E1165 Type 2 diabetes mellitus with hyperglycemia: Secondary | ICD-10-CM

## 2013-12-06 DIAGNOSIS — IMO0002 Reserved for concepts with insufficient information to code with codable children: Secondary | ICD-10-CM

## 2013-12-06 DIAGNOSIS — E118 Type 2 diabetes mellitus with unspecified complications: Principal | ICD-10-CM

## 2013-12-06 NOTE — Progress Notes (Signed)
  Medical Nutrition Therapy:  Appt start time: 1130 end time:  1200.   Assessment:  Primary concerns today: Diabetes. Saw Dr. Fransico HimNida today. He lives by himself. Admits to food addiction. Doesn't want to have gastric bypass because he knows it won't work and he will regain all the weight back. Admits to eating for boredom and late night snacking as his biggest problems. Not able to exercise due to severe obesity. A1C was 8.1%. He admits to eating just because and can't seem to control it at times. Can't exercise due to his weight. Is short of breath with any activity.  Preferred Learning Style:     No preference indicated   Learning Readiness:     Contemplating  MEDICATIONS: see list   DIETARY INTAKE:  24-hr recall:  Usually eats 3 plus meals per day. Admits to eating a lot at night when watching TV, bored and depressed/lonely. Doesn't measure foods out.   Usual physical activity: none by ADL's  Estimated energy needs: 2000 calories 225 g carbohydrates 150 g protein 56 g fat  Progress Towards Goal(s):  In progress.   Nutritional Diagnosis:  NB-1.1 Food and nutrition-related knowledge deficit As related to Diabetes .  As evidenced by A1C 8.1%..    Intervention:  Nutrition counseling on My Plate given and benefits of seeing a therapist for depression and emotional overeating.  1. Follow the Plate Method for now til next visit. Will provide more in-depth education at next visit. 2. Drink water with meals and between meals. 3. Increase fresh fruits and vegetables. 4. Cut out snacks between meals and late at night. 5. Talk to MD about referral to therapist to help with depressed feeling and emotional overeating. Goal: Lose 1 lb per week. 2. Get A1C down to 7.5% in three months.  Teaching Method Utilized: Visual Auditory Hands on  Handouts given during visit include: The Plate Method  Barriers to learning/adherence to lifestyle change: none  Demonstrated degree of  understanding via:  Teach Back   Monitoring/Evaluation:  Dietary intake, exercise, meal planning, SBG, and body weight 1-2 weeks for more intensive counseling on diabetes and weight loss..Marland Kitchen

## 2013-12-06 NOTE — Patient Instructions (Signed)
1. Follow the Plate Method for now til next visit. Will provide more in-depth education at next visit. 2. Drink water with meals and between meals. 3. Increase fresh fruits and vegetables. 4. Cut out snacks between meals and late at night. 5. Talk to MD about referral to therapist to help with depressed feeling and emotional overeating. Goal: Lose 1 lb per week. 2. Get A1C down to 7.5% in three months.

## 2013-12-12 ENCOUNTER — Encounter: Payer: Medicaid Other | Attending: "Endocrinology | Admitting: Nutrition

## 2013-12-12 VITALS — Ht 72.0 in | Wt >= 6400 oz

## 2013-12-12 DIAGNOSIS — E118 Type 2 diabetes mellitus with unspecified complications: Secondary | ICD-10-CM | POA: Diagnosis not present

## 2013-12-12 DIAGNOSIS — Z713 Dietary counseling and surveillance: Secondary | ICD-10-CM | POA: Insufficient documentation

## 2013-12-12 DIAGNOSIS — Z794 Long term (current) use of insulin: Secondary | ICD-10-CM | POA: Insufficient documentation

## 2013-12-12 DIAGNOSIS — Z6841 Body Mass Index (BMI) 40.0 and over, adult: Secondary | ICD-10-CM | POA: Diagnosis not present

## 2013-12-12 DIAGNOSIS — E1165 Type 2 diabetes mellitus with hyperglycemia: Secondary | ICD-10-CM

## 2013-12-12 DIAGNOSIS — IMO0002 Reserved for concepts with insufficient information to code with codable children: Secondary | ICD-10-CM

## 2013-12-12 NOTE — Patient Instructions (Signed)
   1. Follow the Plate Method for now til next visit.  2. Drink water with meals and between meals. 3. Increase fresh fruits and vegetables-especially low carb vegetables with meals 4. Follow the Plate Method as discussed. Take fluid pills as directed. Keep taking insulin as prescribed. 5. Goal: Lose 1 lb per week. 2. Get A1C down to 7.5% in three months.

## 2013-12-12 NOTE — Progress Notes (Signed)
  Medical Nutrition Therapy:  Appt start time: 9:00end time: 930  Assessment:  Primary concerns today: Diabetes.   Has been keeping a food journal and tracking his blood sugars. He lives alone and is working on improving his eating habits. He has cut out junk food, cakes, sweets and drinking only water. Cut down a lot on Diet Coke. BS are improving; FBS 151-185 and before lunch and dinner- 139-190 mg/dl. He is taking 70 units of Lantus, and 10+ units of Novolog with meals and VIctoza daily. Going to pharmacy today to get fluid pills filled. Has had 11 lb weight gain since last visit which is felt to be fluid since he has changed his eating habits so much. Eating more fresh fruits, vegetables and avoiding canned and processed foods. LImited exercise due to breathing issues.   Preferred Learning Style:     No preference indicated   Learning Readiness:     Contemplating  MEDICATIONS: see list   DIETARY INTAKE: Breakfast: Egg salad sandwich, water Lunch: 2 bbq sandwiches, water, carrots and broccoli fresh Dinner: Chicken 2 pc, broccoli/cheese, potato salad, Diet coke Has been eating more broccoli, carrots for snacks instead of cakes, cookies and sweets. Cut down a lot on diet sodas.  Usual physical activity: none by ADL's  Estimated energy needs: 2000 calories 225 g carbohydrates 150 g protein 56 g fat  Progress Towards Goal(s):  In progress.   Nutritional Diagnosis:  NB-1.1 Food and nutrition-related knowledge deficit As related to Diabetes .  As evidenced by A1C 8.1%..    Intervention:  Nutrition counseling diet, meal planning, diabetes, and weight loss.   1. Follow the Plate Method for now til next visit.  2. Drink water with meals and between meals. 3. Increase fresh fruits and vegetables-especially low carb vegetables with meals 4. Follow the Plate Method as discussed. Take fluid pills as directed. Keep taking insulin as prescribed. 5. Goal: Lose 1 lb per week. 2. Get  A1C down to 7.5% in three months.  Teaching Method Utilized: Visual Auditory Hands on  Handouts given during visit include: The Plate Method  Barriers to learning/adherence to lifestyle change: none  Demonstrated degree of understanding via:  Teach Back   Monitoring/Evaluation:  Dietary intake, exercise, meal planning, SBG, and body weight 1 month.

## 2014-01-13 ENCOUNTER — Encounter: Payer: Medicaid Other | Attending: "Endocrinology | Admitting: Nutrition

## 2014-01-13 ENCOUNTER — Encounter: Payer: Self-pay | Admitting: Nutrition

## 2014-01-13 DIAGNOSIS — Z794 Long term (current) use of insulin: Secondary | ICD-10-CM | POA: Diagnosis not present

## 2014-01-13 DIAGNOSIS — Z713 Dietary counseling and surveillance: Secondary | ICD-10-CM | POA: Insufficient documentation

## 2014-01-13 DIAGNOSIS — E118 Type 2 diabetes mellitus with unspecified complications: Secondary | ICD-10-CM | POA: Insufficient documentation

## 2014-01-13 DIAGNOSIS — Z6841 Body Mass Index (BMI) 40.0 and over, adult: Secondary | ICD-10-CM | POA: Diagnosis not present

## 2014-01-13 DIAGNOSIS — E1165 Type 2 diabetes mellitus with hyperglycemia: Secondary | ICD-10-CM

## 2014-01-13 DIAGNOSIS — IMO0002 Reserved for concepts with insufficient information to code with codable children: Secondary | ICD-10-CM

## 2014-01-13 NOTE — Patient Instructions (Signed)
Plan  1. Cut out diet sodas.  2. Walk around the house three times per day.  3. Increase vegetables of green beans, carrots, greens, green beans and salads  4. Drink 6 bottles of water per day.   5. Buy frozen vegetables and cut out canned foods.  6. Measure foods out for portion control  7. Take Insulin and meter with you at all times to test and inject before all meals.  Goal: Lose 1 lb per week 2. Get A1C t0 7.5% in three months.

## 2014-01-13 NOTE — Progress Notes (Signed)
  Medical Nutrition Therapy:  Appt start time:  10:30 am nd time:1100  Assessment:  Primary concerns today: Diabetes. Brought food journal in.BS fasting 170-220 mg/dl. Before lunch 189-264 mg/cl. Dinner  [id number] before bed. BS remaining high. Says he sometimes forget his Novolog if he is eating out or gets home late for lunch or dinner. Trying to eat healthier foods. Only buys frozen vegetables and avoids foods in the can. Likes sandwiches. Has cut out snacks and drinking water band cutting down on diet sodas.. Not exercising much yet. Lost 4 lbs. Says he needs to get a refill on his fluid pills. Feels better. Working on getting someone to come in and help prepare meals for him so he can control his blood sugars better.   Preferred Learning Style:     No preference indicated   Learning Readiness:     Contemplating  MEDICATIONS: see list   DIETARY INTAKE: Breakfast: B) biscuit sausage and egg 1 at [employer], diet soda L) ham, macaroni salad, and potato salad and diet ginerale D) [place] stew 1 quart, 12 crackers, 4 bottles of water  Usual physical activity: none by ADL's  Estimated energy needs: 2000 calories 225 g carbohydrates 150 g protein 56 g fat  Progress Towards Goal(s):  In progress.   Nutritional Diagnosis:  NB-1.1 Food and nutrition-related knowledge deficit As related to Diabetes .  As evidenced by A1C 8.1%..    Intervention:  Nutrition counseling diet, meal planning, diabetes, and weight loss. Plan  1. Cut out diet sodas.  2. Walk around the house three times per day.  3. Increase vegetables of green beans, carrots, greens, green beans and salads  4. Drink 6 bottles of water per day.   5. Buy frozen vegetables and cut out canned foods.  6. Measure foods out for portion control  7. Take Insulin and meter with you at all times to test and inject before all meals.  Goal: Lose 1 lb per week 2. Get A1C t0 7.5% in three months.   Goal:   Teaching Method  Utilized: Visual Auditory Hands on  Handouts given during visit include: The Plate Method  Barriers to learning/adherence to lifestyle change: none  Demonstrated degree of understanding via:  Teach Back   Monitoring/Evaluation:  Dietary intake, exercise, meal planning, SBG, and body weight 1 month.

## 2014-01-16 ENCOUNTER — Encounter (HOSPITAL_COMMUNITY): Payer: Self-pay | Admitting: Urology

## 2014-02-19 ENCOUNTER — Encounter: Payer: Medicaid Other | Attending: "Endocrinology | Admitting: Nutrition

## 2014-02-19 VITALS — Ht 72.0 in | Wt >= 6400 oz

## 2014-02-19 DIAGNOSIS — Z6841 Body Mass Index (BMI) 40.0 and over, adult: Secondary | ICD-10-CM | POA: Diagnosis not present

## 2014-02-19 DIAGNOSIS — E118 Type 2 diabetes mellitus with unspecified complications: Secondary | ICD-10-CM | POA: Insufficient documentation

## 2014-02-19 DIAGNOSIS — Z713 Dietary counseling and surveillance: Secondary | ICD-10-CM | POA: Diagnosis not present

## 2014-02-19 DIAGNOSIS — Z794 Long term (current) use of insulin: Secondary | ICD-10-CM | POA: Insufficient documentation

## 2014-02-19 DIAGNOSIS — IMO0002 Reserved for concepts with insufficient information to code with codable children: Secondary | ICD-10-CM

## 2014-02-19 DIAGNOSIS — E1165 Type 2 diabetes mellitus with hyperglycemia: Secondary | ICD-10-CM

## 2014-02-19 NOTE — Progress Notes (Signed)
  Medical Nutrition Therapy:  Appt start time:  10:30 am nd time:1100  Assessment:  Primary concerns today: Diabetes. Was recently started on fluid pill. Lantus 70, Victoza 1.8 and Novolog 10 units with meals plus sliding scales and Metformin 1000 mg BID. Now taking meds for reflux but doesn't know the name of it. [facility] and they told him it was indigestion and reflux and not heart related chest pain.  He admits to having a problem with overeating. Changes recent made since last visit: Cut out chips, ice cream, diet sodas, drinking more water and unsweet tea. Feels better and not as tired.   Eating more fruit, vegetables. Has cut down on the sausage. Has cut down the 1% milk. Has been more turnip greens and vinegar. Admits to snacking some at night preventing his weight loss.  Preferred Learning Style:     No preference indicated   Learning Readiness:     Contemplating  MEDICATIONS: see list   DIETARY INTAKE: Breakfast: B) Oatmeal 1 c with sweet n low, eggs and 1 sausage or [place] bacon L) Tamales-2, water OR Tomales  D) Hamsandwich with whole wheat bread, mustard and very little mayo, water, and orange 5 bottles of water  Usual physical activity: none by ADL's  Estimated energy needs: 2000 calories 225 g carbohydrates 150 g protein 56 g fat  Progress Towards Goal(s):  In progress.   Nutritional Diagnosis:  NB-1.1 Food and nutrition-related knowledge deficit As related to Diabetes .  As evidenced by A1C 8.1%..    Intervention:  Nutrition counseling diet, meal planning, diabetes, and weight loss. Plan 1. Increase fresh vegetables with lunch and dinner daily. 2. Walk during to commercials for exercise three times a day.  3.. Take Insulin and meter with you at all times to test and inject before all meals. 4.  Goal: Lose 1 lb per week 5. Try to develop a hobby in place of eating at night. Get A1C 7.0 % in three months.  Teaching Method  Utilized: Visual Auditory Hands on  Handouts given during visit include: The Plate Method  Barriers to learning/adherence to lifestyle change: none  Demonstrated degree of understanding via:  Teach Back   Monitoring/Evaluation:  Dietary intake, exercise, meal planning, SBG, and body weight 1 month.

## 2014-02-19 NOTE — Patient Instructions (Signed)
Plan 1. Increase fresh vegetables with lunch and dinner daily. 2. Walk during to commercials for exercise three times a day.  3.. Take Insulin and meter with you at all times to test and inject before all meals. 4.  Goal: Lose 1 lb per week 5. Try to develop a hobby in place of eating at night. Get A1C 7.0 % in three months.

## 2014-03-07 ENCOUNTER — Encounter: Payer: Self-pay | Admitting: Nutrition

## 2014-03-07 ENCOUNTER — Encounter: Payer: Medicaid Other | Attending: "Endocrinology | Admitting: Nutrition

## 2014-03-07 DIAGNOSIS — Z6841 Body Mass Index (BMI) 40.0 and over, adult: Secondary | ICD-10-CM | POA: Insufficient documentation

## 2014-03-07 DIAGNOSIS — Z713 Dietary counseling and surveillance: Secondary | ICD-10-CM | POA: Insufficient documentation

## 2014-03-07 DIAGNOSIS — IMO0002 Reserved for concepts with insufficient information to code with codable children: Secondary | ICD-10-CM

## 2014-03-07 DIAGNOSIS — Z794 Long term (current) use of insulin: Secondary | ICD-10-CM | POA: Diagnosis not present

## 2014-03-07 DIAGNOSIS — E118 Type 2 diabetes mellitus with unspecified complications: Secondary | ICD-10-CM | POA: Insufficient documentation

## 2014-03-07 DIAGNOSIS — E1165 Type 2 diabetes mellitus with hyperglycemia: Secondary | ICD-10-CM

## 2014-03-07 NOTE — Progress Notes (Signed)
  Medical Nutrition Therapy:  Appt start time:  10:30 am end time:1100  Assessment:  Primary concerns today: Diabetes. He has gained 3 lbs since I saw him. He is still eating fried and salty foods contributing to fluid retention and weight gain. Takes his fluid pills. Has tried to eat better but just doesn't have good self control and admits to eating for emotional reasons. Needs to be referred to therapist to deal with emotional eating and possible depression. Diet remains high in salt and low in fresh fruits and vegetables. Is drinking 1% milk and trying to eat healthier things at time but tends to binge eat.   BS are still uncontrolled in the 200-300's. AC down to 8.7% from 9%. Insulin of Lantus increased to 80 units and Novolog to 14 units before meals. Victoza same 1.8 mg/dl.  Preferred Learning Style:     No preference indicated   Learning Readiness:     Contemplating  MEDICATIONS: see list   DIETARY INTAKE: Breakfast: B) Oatmeal- quick oats,  1 bowl, banana, 1% milk, L) CHicken livers, turnip greens 1/2 c, mashed potaotes 1/2c , hush puppies, Diet Coke  D) Tuna sandwich, pickle  5 bottles of water  Usual physical activity: none by ADL's  Estimated energy needs: 2000 calories 225 g carbohydrates 150 g protein 56 g fat  Progress Towards Goal(s):  In progress.   Nutritional Diagnosis:  NB-1.1 Food and nutrition-related knowledge deficit As related to Diabetes .  As evidenced by A1C 8.7%..    Intervention:  Nutrition counseling diet, meal planning, diabetes, and weight loss. Plan 1.Cut out sodas and diet sodas 2. Cut out salt and salty foods. 3. Keep a food journal 4. Cut out fried foods. 5. Cut out eating out. 6. Increase fresh fruits and vegetables. 7. Drink water only     Exercise 15 minutes a day.     Lose 10 lbs in the next month. Get A1C down to 8% in three months.   Teaching Method Utilized: Visual Auditory Hands on  Handouts given during visit  include: The Plate Method  Barriers to learning/adherence to lifestyle change: none  Demonstrated degree of understanding via:  Teach Back   Monitoring/Evaluation:  Dietary intake, exercise, meal planning, SBG, and body weight 1 month.

## 2014-03-07 NOTE — Patient Instructions (Signed)
Plan 1.Cut out sodas and diet sodas 2. Cut out salt and salty foods. 3. Keep a food journal 4. Cut out fried foods. 5. Cut out eating out. 6. Increase fresh fruits and vegetables. 7. Drink water only     Exercise 15 minutes a day.     Lose 10 lbs in the next month. Get A1C down to 8% in three months.

## 2014-03-10 ENCOUNTER — Encounter: Payer: Medicaid Other | Attending: "Endocrinology | Admitting: Nutrition

## 2014-03-10 DIAGNOSIS — E118 Type 2 diabetes mellitus with unspecified complications: Secondary | ICD-10-CM | POA: Insufficient documentation

## 2014-03-10 DIAGNOSIS — Z6841 Body Mass Index (BMI) 40.0 and over, adult: Secondary | ICD-10-CM | POA: Insufficient documentation

## 2014-03-10 DIAGNOSIS — Z713 Dietary counseling and surveillance: Secondary | ICD-10-CM | POA: Insufficient documentation

## 2014-03-10 DIAGNOSIS — Z794 Long term (current) use of insulin: Secondary | ICD-10-CM | POA: Insufficient documentation

## 2014-04-24 ENCOUNTER — Ambulatory Visit: Payer: Medicaid Other | Admitting: Nutrition

## 2014-08-02 ENCOUNTER — Encounter (HOSPITAL_COMMUNITY): Payer: Self-pay | Admitting: Emergency Medicine

## 2014-08-02 ENCOUNTER — Emergency Department (HOSPITAL_COMMUNITY)
Admission: EM | Admit: 2014-08-02 | Discharge: 2014-08-02 | Disposition: A | Discharge status: Home / Self Care | Payer: Medicaid Other | Attending: Emergency Medicine | Admitting: Emergency Medicine

## 2014-08-02 DIAGNOSIS — Z79899 Other long term (current) drug therapy: Secondary | ICD-10-CM | POA: Diagnosis not present

## 2014-08-02 DIAGNOSIS — I10 Essential (primary) hypertension: Secondary | ICD-10-CM | POA: Diagnosis not present

## 2014-08-02 DIAGNOSIS — L509 Urticaria, unspecified: Secondary | ICD-10-CM | POA: Insufficient documentation

## 2014-08-02 DIAGNOSIS — Z87438 Personal history of other diseases of male genital organs: Secondary | ICD-10-CM | POA: Insufficient documentation

## 2014-08-02 DIAGNOSIS — J449 Chronic obstructive pulmonary disease, unspecified: Secondary | ICD-10-CM | POA: Diagnosis not present

## 2014-08-02 DIAGNOSIS — Z794 Long term (current) use of insulin: Secondary | ICD-10-CM | POA: Diagnosis not present

## 2014-08-02 DIAGNOSIS — Z791 Long term (current) use of non-steroidal anti-inflammatories (NSAID): Secondary | ICD-10-CM | POA: Diagnosis not present

## 2014-08-02 DIAGNOSIS — R21 Rash and other nonspecific skin eruption: Secondary | ICD-10-CM | POA: Diagnosis present

## 2014-08-02 DIAGNOSIS — E119 Type 2 diabetes mellitus without complications: Secondary | ICD-10-CM | POA: Diagnosis not present

## 2014-08-02 DIAGNOSIS — Z872 Personal history of diseases of the skin and subcutaneous tissue: Secondary | ICD-10-CM | POA: Insufficient documentation

## 2014-08-02 DIAGNOSIS — M109 Gout, unspecified: Secondary | ICD-10-CM | POA: Insufficient documentation

## 2014-08-02 MED ORDER — DEXAMETHASONE SODIUM PHOSPHATE 4 MG/ML IJ SOLN
8.0000 mg | Freq: Once | INTRAMUSCULAR | Status: AC
  Administered 2014-08-02: 8 mg via INTRAMUSCULAR
  Filled 2014-08-02: qty 2

## 2014-08-02 MED ORDER — HYDROXYZINE HCL 25 MG PO TABS
25.0000 mg | ORAL_TABLET | Freq: Once | ORAL | Status: AC
  Administered 2014-08-02: 25 mg via ORAL
  Filled 2014-08-02: qty 1

## 2014-08-02 MED ORDER — DOXYCYCLINE HYCLATE 100 MG PO CAPS
100.0000 mg | ORAL_CAPSULE | Freq: Two times a day (BID) | ORAL | Status: DC
Start: 2014-08-02 — End: 2014-09-08

## 2014-08-02 NOTE — ED Notes (Signed)
Pt states that he has had hives on his back since last night.  Took 2 benadryl about 0900 this morning and feels like it is better now.

## 2014-08-02 NOTE — Discharge Instructions (Signed)
I believe your hives may be related to URI, but can not rule out keflex. You were treated in the emergency department with Decadron and Vistaril. The Vistaril may cause drowsiness, please use caution getting around today. Please stop the Keflex for now. Please use doxycycline 2 times daily with food. Please use Benadryl every 6 hours if itching, or hives should recur. Benadryl may cause drowsiness, please do not drive, operate machinery, or participate in Hives Hives are itchy, red, puffy (swollen) areas of the skin. Hives can change in size and location on your body. Hives can come and go for hours, days, or weeks. Hives do not spread from person to person (noncontagious). Scratching, exercise, and stress can make your hives worse. HOME CARE  Avoid things that cause your hives (triggers).  Take antihistamine medicines as told by your doctor. Do not drive while taking an antihistamine.  Take any other medicines for itching as told by your doctor.  Wear loose-fitting clothing.  Keep all doctor visits as told. GET HELP RIGHT AWAY IF:   You have a fever.  Your tongue or lips are puffy.  You have trouble breathing or swallowing.  You feel tightness in the throat or chest.  You have belly (abdominal) pain.  You have lasting or severe itching that is not helped by medicine.  You have painful or puffy joints. These problems may be the first sign of a life-threatening allergic reaction. Call your local emergency services (911 in [place] MAKE SURE YOU:   Understand these instructions.  Will watch your condition.  Will get help right away if you are not doing well or get worse. Document Released: 09/29/2007 Document Revised: 06/21/2011 Document Reviewed: 03/15/2011 [facility] [employer] Patient Information 2015 [place], [place]. This information is not intended to replace advice given to you by your health care provider. Make sure you discuss any questions you have with your health care provider.  activities requiring concentration when taking Benadryl. Please see your primary doctor for referral to an allergy specialist for testing. Please return immediately to the emergency department if any changes, problems breathing, hives that will not resolve with Benadryl, or changes in your general condition.

## 2014-08-02 NOTE — ED Provider Notes (Signed)
CSN: [id number]     Arrival date & time 08/02/14  1124 History   First MD Initiated Contact with Patient 08/02/14 1146     Chief Complaint  Patient presents with  . Urticaria     (Consider location/radiation/quality/duration/timing/severity/associated sxs/prior Treatment) Patient is a 54 y.o. male presenting with urticaria. The history is provided by the patient.  Urticaria This is a new problem. The current episode started yesterday. The problem occurs intermittently. The problem has been gradually improving. Associated symptoms include coughing and a rash. Pertinent negatives include no abdominal pain, arthralgias, chest pain, chills, fever, myalgias, neck pain or sore throat. Nothing aggravates the symptoms. Treatments tried: benadryl. The treatment provided moderate relief.    Past Medical History  Diagnosis Date  . Diabetes mellitus without complication   . Asthma   . COPD (chronic obstructive pulmonary disease)   . Hypertension   . Gout   . Sleep apnea   . Balanitis   . Cellulitis   . Lichen sclerosus   . Obesity, morbid (more than 100 lbs over ideal weight or BMI > 40)    Past Surgical History  Procedure Laterality Date  . Cholecystectomy     History reviewed. No pertinent family history. History  Substance Use Topics  . Smoking status: Never Smoker   . Smokeless tobacco: Not on file  . Alcohol Use: No    Review of Systems  Constitutional: Negative for fever, chills and activity change.       All ROS Neg except as noted in HPI  HENT: Negative for nosebleeds and sore throat.   Eyes: Negative for photophobia and discharge.  Respiratory: Positive for cough. Negative for shortness of breath and wheezing.   Cardiovascular: Negative for chest pain and palpitations.  Gastrointestinal: Negative for abdominal pain and blood in stool.  Genitourinary: Negative for dysuria, frequency and hematuria.  Musculoskeletal: Negative for myalgias, back pain, arthralgias and neck  pain.  Skin: Positive for rash.  Neurological: Negative for dizziness, seizures and speech difficulty.  Psychiatric/Behavioral: Negative for hallucinations and confusion.  All other systems reviewed and are negative.     Allergies  Biaxin  Home Medications   Prior to Admission medications   Medication Sig Start Date End Date Taking? Authorizing Provider  albuterol (PROVENTIL HFA;VENTOLIN HFA) 108 (90 BASE) MCG/ACT inhaler Inhale 2 puffs into the lungs 2 (two) times daily as needed for wheezing or shortness of breath.   Yes Historical Provider, MD  albuterol (PROVENTIL) (2.5 MG/3ML) 0.083% nebulizer solution Take 2.5 mg by nebulization every 6 (six) hours as needed for wheezing or shortness of breath.    Yes Historical Provider, MD  allopurinol (ZYLOPRIM) 300 MG tablet Take 300 mg by mouth daily.     Yes Historical Provider, MD  ALPRAZolam Prudy Feeler) 1 MG tablet Take 1 mg by mouth 4 (four) times daily.     Yes Historical Provider, MD  benazepril (LOTENSIN) 5 MG tablet Take 5 mg by mouth daily.   Yes Historical Provider, MD  canagliflozin (INVOKANA) 100 MG TABS tablet Take 100 mg by mouth. Takes 1 every other day.   Yes Historical Provider, MD  cephALEXin (KEFLEX) 500 MG capsule Take 500 mg by mouth 3 (three) times daily. For 10 days. Started on 7/20   Yes Historical Provider, MD  diphenhydrAMINE (BENADRYL) 25 mg capsule Take 50 mg by mouth every 6 (six) hours as needed for itching.   Yes Historical Provider, MD  HYDROcodone-acetaminophen (NORCO) 10-325 MG per tablet Take 1 tablet by  mouth 4 (four) times daily.   Yes Historical Provider, MD  indomethacin (INDOCIN) 50 MG capsule Take 50 mg by mouth daily.    Yes Historical Provider, MD  insulin aspart (NOVOLOG FLEXPEN) 100 UNIT/ML FlexPen Inject 16-18 Units into the skin 3 (three) times daily with meals. On sliding scale   Yes Historical Provider, MD  insulin glargine (LANTUS) 100 UNIT/ML injection Inject 80 Units into the skin at bedtime.     Yes Historical Provider, MD  Liraglutide (VICTOZA) 18 MG/3ML SOPN Inject 1.8 Units into the skin daily.   Yes Historical Provider, MD  loperamide (IMODIUM) 2 MG capsule Take 2 mg by mouth as needed for diarrhea or loose stools.   Yes Historical Provider, MD  metFORMIN (GLUCOPHAGE) 500 MG tablet Take 1,000 mg by mouth 2 (two) times daily with a meal.    Yes Historical Provider, MD  Multiple Vitamin (MULTIVITAMIN WITH MINERALS) TABS tablet Take 1 tablet by mouth daily.   Yes Historical Provider, MD  ondansetron (ZOFRAN-ODT) 4 MG disintegrating tablet Take 4 mg by mouth every 6 (six) hours as needed for nausea or vomiting.   Yes Historical Provider, MD  pravastatin (PRAVACHOL) 40 MG tablet Take 40 mg by mouth daily.   Yes Historical Provider, MD  tiotropium (SPIRIVA) 18 MCG inhalation capsule Place 18 mcg into inhaler and inhale daily.   Yes Historical Provider, MD  verapamil (CALAN-SR) 240 MG CR tablet Take 240 mg by mouth daily.   Yes Historical Provider, MD  Vitamin D, Ergocalciferol, (DRISDOL) 50000 UNITS CAPS capsule Take 50,000 Units by mouth every 7 (seven) days. On Tuesday's.   Yes Historical Provider, MD  zolpidem (AMBIEN) 10 MG tablet Take 10 mg by mouth at bedtime as needed for sleep.    Yes Historical Provider, MD   BP 137/59 mmHg  Pulse 70  Temp(Src) 98 F (36.7 C) (Oral)  Resp 20  Ht 6\' 1"  (1.854 m)  Wt 408 lb (185.068 kg)  BMI 53.84 kg/m2  SpO2 100% Physical Exam  Constitutional: He is oriented to person, place, and time. He appears well-developed and well-nourished.  Non-toxic appearance.  HENT:  Head: Normocephalic.  Right Ear: Tympanic membrane and external ear normal.  Left Ear: Tympanic membrane and external ear normal.  Eyes: EOM and lids are normal. Pupils are equal, round, and reactive to light.  Neck: Normal range of motion. Neck supple. Carotid bruit is not present.  Cardiovascular: Normal rate, regular rhythm, normal heart sounds, intact distal pulses and normal  pulses.   Pulmonary/Chest: Breath sounds normal. No respiratory distress.  Abdominal: Soft. Bowel sounds are normal. There is no tenderness. There is no guarding.  Musculoskeletal: Normal range of motion.  Lymphadenopathy:       Head (right side): No submandibular adenopathy present.       Head (left side): No submandibular adenopathy present.    He has no cervical adenopathy.  Neurological: He is alert and oriented to person, place, and time. He has normal strength. No cranial nerve deficit or sensory deficit.  Skin: Skin is warm and dry. Rash noted. Rash is urticarial. He is not diaphoretic. No cyanosis.  Psychiatric: He has a normal mood and affect. His speech is normal.  Nursing note and vitals reviewed.   ED Course  Procedures (including critical care time) Labs Review Labs Reviewed - No data to display  Imaging Review No results found.   EKG Interpretation None      MDM  Hives on the back are improving according  to the patient. The vital signs were within normal limits. The pulse oximetry is 97% on room air. The patient speaks in complete sentences without any problem with sore.  Patient was observed in the emergency department after receiving Decadron and Vistaril with continued improvement in the eyes.  The patient is to return to the emergency department if any changes, problems, or concerns. I discussed anaphylactic type reactions with the patient. The patient had been on Keflex. Habits the patient stopped the Keflex for now and to use doxycycline. The patient is also to schedule an appointment with allergy specialist.    Final diagnoses:  None    **I have reviewed nursing notes, vital signs, and all appropriate lab and imaging results for this patient.*  No and a you in a  Ivery Quale, PA-C 08/04/14 1904  Rolland Porter, MD 08/08/14 [phone]

## 2014-08-15 ENCOUNTER — Emergency Department (HOSPITAL_COMMUNITY)
Admission: EM | Admit: 2014-08-15 | Discharge: 2014-08-16 | Disposition: A | Discharge status: Home / Self Care | Payer: Medicaid Other | Attending: Emergency Medicine | Admitting: Emergency Medicine

## 2014-08-15 ENCOUNTER — Encounter (HOSPITAL_COMMUNITY): Payer: Self-pay | Admitting: Emergency Medicine

## 2014-08-15 DIAGNOSIS — E119 Type 2 diabetes mellitus without complications: Secondary | ICD-10-CM | POA: Diagnosis not present

## 2014-08-15 DIAGNOSIS — Z8739 Personal history of other diseases of the musculoskeletal system and connective tissue: Secondary | ICD-10-CM | POA: Insufficient documentation

## 2014-08-15 DIAGNOSIS — Z794 Long term (current) use of insulin: Secondary | ICD-10-CM | POA: Insufficient documentation

## 2014-08-15 DIAGNOSIS — I10 Essential (primary) hypertension: Secondary | ICD-10-CM | POA: Insufficient documentation

## 2014-08-15 DIAGNOSIS — J441 Chronic obstructive pulmonary disease with (acute) exacerbation: Secondary | ICD-10-CM | POA: Insufficient documentation

## 2014-08-15 DIAGNOSIS — Z87438 Personal history of other diseases of male genital organs: Secondary | ICD-10-CM | POA: Insufficient documentation

## 2014-08-15 DIAGNOSIS — Z79899 Other long term (current) drug therapy: Secondary | ICD-10-CM | POA: Insufficient documentation

## 2014-08-15 DIAGNOSIS — R0602 Shortness of breath: Secondary | ICD-10-CM | POA: Diagnosis present

## 2014-08-15 DIAGNOSIS — Z872 Personal history of diseases of the skin and subcutaneous tissue: Secondary | ICD-10-CM | POA: Diagnosis not present

## 2014-08-15 DIAGNOSIS — J45901 Unspecified asthma with (acute) exacerbation: Secondary | ICD-10-CM

## 2014-08-15 MED ORDER — IPRATROPIUM-ALBUTEROL 0.5-2.5 (3) MG/3ML IN SOLN
RESPIRATORY_TRACT | Status: AC
  Administered 2014-08-15: 3 mL
  Filled 2014-08-15: qty 3

## 2014-08-15 MED ORDER — DIPHENHYDRAMINE HCL 50 MG/ML IJ SOLN
INTRAMUSCULAR | Status: AC
  Administered 2014-08-15: 50 mg
  Filled 2014-08-15: qty 1

## 2014-08-15 MED ORDER — EPINEPHRINE 0.3 MG/0.3ML IJ SOAJ
INTRAMUSCULAR | Status: AC
  Filled 2014-08-15: qty 0.3

## 2014-08-15 MED ORDER — ALBUTEROL SULFATE (2.5 MG/3ML) 0.083% IN NEBU
INHALATION_SOLUTION | RESPIRATORY_TRACT | Status: AC
  Administered 2014-08-15: 2.5 mg
  Filled 2014-08-15: qty 3

## 2014-08-15 MED ORDER — FAMOTIDINE IN NACL 20-0.9 MG/50ML-% IV SOLN
INTRAVENOUS | Status: AC
  Administered 2014-08-15: 20 mg
  Filled 2014-08-15: qty 50

## 2014-08-15 MED ORDER — METHYLPREDNISOLONE SODIUM SUCC 125 MG IJ SOLR
INTRAMUSCULAR | Status: AC
  Administered 2014-08-15: 125 mg
  Filled 2014-08-15: qty 2

## 2014-08-15 NOTE — ED Provider Notes (Signed)
CSN: [id number]     Arrival date & time 08/15/14  2134 History  This chart was scribed for Aaron Bale, MD by Phillis Haggis, ED Scribe. This patient was seen in room [facility]03/[facility]03 and patient care was started at 9:34 PM.    Chief Complaint  Patient presents with  . Allergic Reaction   The history is provided by the patient. No language interpreter was used.  HPI Comments (Level V Caveat due to condition of the Patient): Aaron Potter is a 54 y.o. Male with a hx of DM, COPD, asthma, and HTN who presents to the Emergency Department complaining of SOB and trouble swallowing onset one hour ago. Pt states that he was eating butter pecan ice cream when he began to feel his throat close up and has been having trouble breathing. Pt states that he was seen two weeks ago for breaking out in hives and was given medication. He also saw his doctor 4 days ago and was given a Decadron Dosepak, for persistent symptoms. Pt denies hx of nut allergies or heart conditions.   Past Medical History  Diagnosis Date  . Diabetes mellitus without complication   . Asthma   . COPD (chronic obstructive pulmonary disease)   . Hypertension   . Gout   . Sleep apnea   . Balanitis   . Cellulitis   . Lichen sclerosus   . Obesity, morbid (more than 100 lbs over ideal weight or BMI > 40)    Past Surgical History  Procedure Laterality Date  . Cholecystectomy     No family history on file. Social History  Substance Use Topics  . Smoking status: Never Smoker   . Smokeless tobacco: None  . Alcohol Use: No    Review of Systems  Unable to perform ROS: Acuity of condition  HENT: Positive for trouble swallowing and voice change.   Respiratory: Positive for shortness of breath.   All other systems reviewed and are negative.  Allergies  Biaxin  Home Medications   Prior to Admission medications   Medication Sig Start Date End Date Taking? Authorizing Provider  albuterol (PROVENTIL HFA;VENTOLIN HFA) 108 (90 BASE)  MCG/ACT inhaler Inhale 2 puffs into the lungs 2 (two) times daily as needed for wheezing or shortness of breath.    Historical Provider, MD  albuterol (PROVENTIL) (2.5 MG/3ML) 0.083% nebulizer solution Take 2.5 mg by nebulization every 6 (six) hours as needed for wheezing or shortness of breath.     Historical Provider, MD  allopurinol (ZYLOPRIM) 300 MG tablet Take 300 mg by mouth daily.      Historical Provider, MD  ALPRAZolam Prudy Feeler) 1 MG tablet Take 1 mg by mouth 4 (four) times daily.      Historical Provider, MD  benazepril (LOTENSIN) 5 MG tablet Take 5 mg by mouth daily.    Historical Provider, MD  canagliflozin (INVOKANA) 100 MG TABS tablet Take 100 mg by mouth. Takes 1 every other day.    Historical Provider, MD  cephALEXin (KEFLEX) 500 MG capsule Take 500 mg by mouth 3 (three) times daily. For 10 days. Started on 7/20    Historical Provider, MD  diphenhydrAMINE (BENADRYL) 25 mg capsule Take 50 mg by mouth every 6 (six) hours as needed for itching.    Historical Provider, MD  doxycycline (VIBRAMYCIN) 100 MG capsule Take 1 capsule (100 mg total) by mouth 2 (two) times daily. 08/02/14   Ivery Quale, PA-C  HYDROcodone-acetaminophen (NORCO) 10-325 MG per tablet Take 1 tablet by mouth 4 (  four) times daily.    Historical Provider, MD  indomethacin (INDOCIN) 50 MG capsule Take 50 mg by mouth daily.     Historical Provider, MD  insulin aspart (NOVOLOG FLEXPEN) 100 UNIT/ML FlexPen Inject 16-18 Units into the skin 3 (three) times daily with meals. On sliding scale    Historical Provider, MD  insulin glargine (LANTUS) 100 UNIT/ML injection Inject 80 Units into the skin at bedtime.     Historical Provider, MD  Liraglutide (VICTOZA) 18 MG/3ML SOPN Inject 1.8 Units into the skin daily.    Historical Provider, MD  loperamide (IMODIUM) 2 MG capsule Take 2 mg by mouth as needed for diarrhea or loose stools.    Historical Provider, MD  metFORMIN (GLUCOPHAGE) 500 MG tablet Take 1,000 mg by mouth 2 (two) times  daily with a meal.     Historical Provider, MD  Multiple Vitamin (MULTIVITAMIN WITH MINERALS) TABS tablet Take 1 tablet by mouth daily.    Historical Provider, MD  ondansetron (ZOFRAN-ODT) 4 MG disintegrating tablet Take 4 mg by mouth every 6 (six) hours as needed for nausea or vomiting.    Historical Provider, MD  pravastatin (PRAVACHOL) 40 MG tablet Take 40 mg by mouth daily.    Historical Provider, MD  tiotropium (SPIRIVA) 18 MCG inhalation capsule Place 18 mcg into inhaler and inhale daily.    Historical Provider, MD  verapamil (CALAN-SR) 240 MG CR tablet Take 240 mg by mouth daily.    Historical Provider, MD  Vitamin D, Ergocalciferol, (DRISDOL) 50000 UNITS CAPS capsule Take 50,000 Units by mouth every 7 (seven) days. On Tuesday's.    Historical Provider, MD  zolpidem (AMBIEN) 10 MG tablet Take 10 mg by mouth at bedtime as needed for sleep.     Historical Provider, MD   BP 150/59 mmHg  Pulse 84  Resp 20  Ht 6\' 1"  (1.854 m)  Wt 410 lb (185.975 kg)  BMI 54.10 kg/m2  SpO2 99%  Physical Exam  Constitutional: He is oriented to person, place, and time. He appears well-developed and well-nourished.  HENT:  Head: Normocephalic and atraumatic.  Right Ear: External ear normal.  Left Ear: External ear normal.  Eyes: Conjunctivae and EOM are normal. Pupils are equal, round, and reactive to light.  Neck: Normal range of motion and phonation normal. Neck supple.  Voice slightly altered  Cardiovascular: Normal rate, regular rhythm and normal heart sounds.   No peripheral edema  Pulmonary/Chest: Effort normal and breath sounds normal. No stridor. No respiratory distress. He has no wheezes. He has no rales. He exhibits no bony tenderness.  No increased work up breathing  Abdominal: Soft. There is no tenderness.  Musculoskeletal: Normal range of motion.  Neurological: He is alert and oriented to person, place, and time. No cranial nerve deficit or sensory deficit. He exhibits normal muscle tone.  Coordination normal.  Skin: Skin is warm, dry and intact.  Psychiatric: He has a normal mood and affect. His behavior is normal. Judgment and thought content normal.  Nursing note and vitals reviewed.   ED Course  Procedures (including critical care time) DIAGNOSTIC STUDIES: Oxygen Saturation is 99% on RA, normal by my interpretation.    COORDINATION OF CARE: 9:37PM-Discussed treatment plan which includes Pepcid, Benadryl,  Solu-Medrol,  and breathing treatment with pt at bedside and pt agreed to plan.   9:55 PM- recheck of pt who is now doing a breathing treatment to relief  10:22 PM-recheck of pt; improved air movement post breathing treatment; will continue to  monitor patient ; family states that the pt has an appt scheduled with an allergist  Medications  EPINEPHrine (EPI-PEN) 0.3 mg/0.3 mL injection (not administered)  diphenhydrAMINE (BENADRYL) 50 MG/ML injection (50 mg  Given 08/15/14 2145)  methylPREDNISolone sodium succinate (SOLU-MEDROL) 125 mg/2 mL injection (125 mg  Given 08/15/14 2145)  famotidine (PEPCID) 20-0.9 MG/50ML-% IVPB (  Stopped 08/15/14 2241)  ipratropium-albuterol (DUONEB) 0.5-2.5 (3) MG/3ML nebulizer solution (3 mLs  Given 08/15/14 2148)  albuterol (PROVENTIL) (2.5 MG/3ML) 0.083% nebulizer solution (2.5 mg  Given 08/15/14 2148)    Patient Vitals for the past 24 hrs:  BP Pulse Resp SpO2 Height Weight  08/15/14 2220 (!) 120/53 mmHg 82 16 99 % - -  08/15/14 2149 - - - 99 % - -  08/15/14 2140 150/59 mmHg - - - - -  08/15/14 2137 - 84 20 99 %  (1.854 m) (!) 410 lb (185.975 kg)    12:23 AM Reevaluation with update and discussion. After initial assessment and treatment, an updated evaluation reveals he states that he feels much better. His voice is normal sounding. There is no respiratory distress. Lungs have improved air movement. Findings discussed with the patient, all questions were answered.Aaron Potter L     Labs Review Labs Reviewed - No data to  display  Imaging Review No results found.   EKG Interpretation None       CRITICAL CARE Performed by: Aaron Bale, MD Total critical care time: 45 minutes Critical care time was exclusive of separately billable procedures and treating other patients. Critical care was necessary to treat or prevent imminent or life-threatening deterioration. Critical care was time spent personally by me on the following activities: development of treatment plan with patient and/or surrogate as well as nursing, discussions with consultants, evaluation of patient's response to treatment, examination of patient, obtaining history from patient or surrogate, ordering and performing treatments and interventions, ordering and review of laboratory studies, ordering and review of radiographic studies, pulse oximetry and re-evaluation of patient's condition.  MDM   Final diagnoses:  Asthma exacerbation    Bronchospasm, cause not clear. Possible asthma exacerbation versus possible food allergy. Patient improved with treatment and has reached his near baseline. Doubt anaphylaxis.  Nursing Notes Reviewed/ Care Coordinated Applicable Imaging Reviewed Interpretation of Laboratory Data incorporated into ED treatment  The patient appears reasonably screened and/or stabilized for discharge and I doubt any other medical condition or other [facility] requiring further screening, evaluation, or treatment in the ED at this time prior to discharge.  Plan: Home Medications- continue current Decadron, use Benadryl and Pepcid for 4 days; Home Treatments- rest; return here if the recommended treatment, does not improve the symptoms; Recommended follow up- PCP 3 days      I personally performed the services described in this documentation, which was scribed in my presence. The recorded information has been reviewed and is accurate.      Aaron Bale, MD 08/16/14 [id number]

## 2014-08-15 NOTE — ED Notes (Addendum)
About 60-90 minutes ago ate some Pecan  Ice cream and shortly after that felt like his throat was closing up . No Known allergy to nuts  Stated that he attempted to take  Benadryl but was unable to swallow -

## 2014-08-15 NOTE — ED Notes (Signed)
Pt states he is feeling much better at this time. No more SOB or feeling of tightness in his throat. Ambulated to bathroom and back to room without issues.

## 2014-08-16 NOTE — ED Notes (Signed)
Discharge instructions given, pt demonstrated teach back and verbal understanding. No concerns voiced.  

## 2014-08-16 NOTE — Discharge Instructions (Signed)
Use your nebulizer, every 3-4 hours as needed for trouble breathing. Continue taking Benadryl 25-50 mg every 6 hours for 3 or 4 days. Also use Pepcid, 20 mg twice a day for 4 days. Return here, if needed, for problems.    Asthma, Acute Bronchospasm Acute bronchospasm caused by asthma is also referred to as an asthma attack. Bronchospasm means your air passages become narrowed. The narrowing is caused by inflammation and tightening of the muscles in the air tubes (bronchi) in your lungs. This can make it hard to breathe or cause you to wheeze and cough. CAUSES Possible triggers are:  Animal dander from the skin, hair, or feathers of animals.  Dust mites contained in house dust.  Cockroaches.  Pollen from trees or grass.  Mold.  Cigarette or tobacco smoke.  Air pollutants such as dust, household cleaners, hair sprays, aerosol sprays, paint fumes, strong chemicals, or strong odors.  Cold air or weather changes. Cold air may trigger inflammation. Winds increase molds and pollens in the air.  Strong emotions such as crying or laughing hard.  Stress.  Certain medicines such as aspirin or beta-blockers.  Sulfites in foods and drinks, such as dried fruits and wine.  Infections or inflammatory conditions, such as a flu, cold, or inflammation of the nasal membranes (rhinitis).  Gastroesophageal reflux disease (GERD). GERD is a condition where stomach acid backs up into your esophagus.  Exercise or strenuous activity. SIGNS AND SYMPTOMS   Wheezing.  Excessive coughing, particularly at night.  Chest tightness.  Shortness of breath. DIAGNOSIS  Your health care provider will ask you about your medical history and perform a physical exam. A chest X-ray or blood testing may be performed to look for other causes of your symptoms or other conditions that may have triggered your asthma attack. TREATMENT  Treatment is aimed at reducing inflammation and opening up the airways in your  lungs. Most asthma attacks are treated with inhaled medicines. These include quick relief or rescue medicines (such as bronchodilators) and controller medicines (such as inhaled corticosteroids). These medicines are sometimes given through an inhaler or a nebulizer. Systemic steroid medicine taken by mouth or given through an IV tube also can be used to reduce the inflammation when an attack is moderate or severe. Antibiotic medicines are only used if a bacterial infection is present.  HOME CARE INSTRUCTIONS   Rest.  Drink plenty of liquids. This helps the mucus to remain thin and be easily coughed up. Only use caffeine in moderation and do not use alcohol until you have recovered from your illness.  Do not smoke. Avoid being exposed to secondhand smoke.  You play a critical role in keeping yourself in good health. Avoid exposure to things that cause you to wheeze or to have breathing problems.  Keep your medicines up-to-date and available. Carefully follow your health care provider's treatment plan.  Take your medicine exactly as prescribed.  When pollen or pollution is bad, keep windows closed and use an air conditioner or go to places with air conditioning.  Asthma requires careful medical care. See your health care provider for a follow-up as advised. If you are more than [redacted] weeks pregnant and you were prescribed any new medicines, let your obstetrician know about the visit and how you are doing. Follow up with your health care provider as directed.  After you have recovered from your asthma attack, make an appointment with your outpatient doctor to talk about ways to reduce the likelihood of future attacks.  If you do not have a doctor who manages your asthma, make an appointment with a primary care doctor to discuss your asthma. SEEK IMMEDIATE MEDICAL CARE IF:   You are getting worse.  You have trouble breathing. If severe, call your local emergency services (911 in the [place]  You  develop chest pain or discomfort.  You are vomiting.  You are not able to keep fluids down.  You are coughing up yellow, green, brown, or bloody sputum.  You have a fever and your symptoms suddenly get worse.  You have trouble swallowing. MAKE SURE YOU:   Understand these instructions.  Will watch your condition.  Will get help right away if you are not doing well or get worse. Document Released: 04/06/2006 Document Revised: 12/25/2012 Document Reviewed: 06/27/2012 [facility] Patient Information 2015 [place], [place]. This information is not intended to replace advice given to you by your health care provider. Make sure you discuss any questions you have with your health care provider.

## 2014-09-01 NOTE — Telephone Encounter (Signed)
Open in error

## 2014-09-08 ENCOUNTER — Emergency Department (HOSPITAL_COMMUNITY)
Admission: EM | Admit: 2014-09-08 | Discharge: 2014-09-08 | Disposition: A | Discharge status: Home / Self Care | Payer: Medicaid Other | Attending: Emergency Medicine | Admitting: Emergency Medicine

## 2014-09-08 ENCOUNTER — Encounter (HOSPITAL_COMMUNITY): Payer: Self-pay | Admitting: Emergency Medicine

## 2014-09-08 DIAGNOSIS — Z87438 Personal history of other diseases of male genital organs: Secondary | ICD-10-CM | POA: Insufficient documentation

## 2014-09-08 DIAGNOSIS — Z79899 Other long term (current) drug therapy: Secondary | ICD-10-CM | POA: Insufficient documentation

## 2014-09-08 DIAGNOSIS — Z8669 Personal history of other diseases of the nervous system and sense organs: Secondary | ICD-10-CM | POA: Diagnosis not present

## 2014-09-08 DIAGNOSIS — M79661 Pain in right lower leg: Secondary | ICD-10-CM | POA: Diagnosis present

## 2014-09-08 DIAGNOSIS — E119 Type 2 diabetes mellitus without complications: Secondary | ICD-10-CM | POA: Diagnosis not present

## 2014-09-08 DIAGNOSIS — I10 Essential (primary) hypertension: Secondary | ICD-10-CM | POA: Insufficient documentation

## 2014-09-08 DIAGNOSIS — J449 Chronic obstructive pulmonary disease, unspecified: Secondary | ICD-10-CM | POA: Diagnosis not present

## 2014-09-08 DIAGNOSIS — Z794 Long term (current) use of insulin: Secondary | ICD-10-CM | POA: Insufficient documentation

## 2014-09-08 DIAGNOSIS — M109 Gout, unspecified: Secondary | ICD-10-CM | POA: Insufficient documentation

## 2014-09-08 DIAGNOSIS — L03115 Cellulitis of right lower limb: Secondary | ICD-10-CM | POA: Insufficient documentation

## 2014-09-08 MED ORDER — CLINDAMYCIN HCL 300 MG PO CAPS: 300.0000 mg | ORAL_CAPSULE | Freq: Four times a day (QID) | ORAL | Status: DC

## 2014-09-08 NOTE — ED Notes (Signed)
MD at bedside. 

## 2014-09-08 NOTE — ED Notes (Signed)
Pain to calf since Friday.  Rates pain 3/10 and pain increases with walking.  Started new diabetic medication (invokana) about 2 months ago.  C/o of leg feeling hot.

## 2014-09-08 NOTE — Discharge Instructions (Signed)

## 2014-09-08 NOTE — ED Provider Notes (Signed)
CSN: [id number]     Arrival date & time 09/08/14  0913 History  This chart was scribed for Melene Plan, DO by Ronney Lion, ED Scribe. This patient was seen in room APA04/APA04 and the patient's care was started at 9:31 AM.    Chief Complaint  Patient presents with  . Leg Pain    right   Patient is a 54 y.o. male presenting with leg pain. The history is provided by the patient. No language interpreter was used.  Leg Pain Location:  Leg Leg location:  R lower leg Pain details:    Quality:  Burning   Radiates to:  Does not radiate   Severity:  Moderate   Onset quality:  Gradual   Timing:  Constant   Progression:  Worsening Chronicity:  New Dislocation: no   Foreign body present:  No foreign bodies Relieved by:  None tried Worsened by:  Nothing tried Ineffective treatments:  Movement Associated symptoms: no fever     HPI Comments: Aaron Potter is a 54 y.o. male with a history of DM without complication, gout, and morbid obesity, who presents to the Emergency Department complaining of a localized area of burning pain and redness on his right calf that began 3 days ago. He denies a history of any known abscesses. Walking on his leg doesn't affect the pain. He denies any fever or chills. Patient has known allergies to Keflex, which he recently took and which caused hives.   Past Medical History  Diagnosis Date  . Diabetes mellitus without complication   . Asthma   . COPD (chronic obstructive pulmonary disease)   . Hypertension   . Gout   . Sleep apnea   . Balanitis   . Cellulitis   . Lichen sclerosus   . Obesity, morbid (more than 100 lbs over ideal weight or BMI > 40)    Past Surgical History  Procedure Laterality Date  . Cholecystectomy     History reviewed. No pertinent family history. Social History  Substance Use Topics  . Smoking status: Never Smoker   . Smokeless tobacco: None  . Alcohol Use: No    Review of Systems  Constitutional: Negative for fever and chills.   HENT: Negative for congestion and facial swelling.   Eyes: Negative for discharge and visual disturbance.  Respiratory: Negative for shortness of breath.   Cardiovascular: Negative for chest pain and palpitations.  Gastrointestinal: Negative for vomiting, abdominal pain and diarrhea.  Musculoskeletal: Negative for myalgias and arthralgias.  Skin: Positive for color change (area of redness). Negative for rash.  Neurological: Negative for tremors, syncope and headaches.  Psychiatric/Behavioral: Negative for confusion and dysphoric mood.  All other systems reviewed and are negative.     Allergies  Biaxin and Peanut-containing drug products  Home Medications   Prior to Admission medications   Medication Sig Start Date End Date Taking? Authorizing Provider  albuterol (PROVENTIL HFA;VENTOLIN HFA) 108 (90 BASE) MCG/ACT inhaler Inhale 2 puffs into the lungs 2 (two) times daily as needed for wheezing or shortness of breath.   Yes Historical Provider, MD  albuterol (PROVENTIL) (2.5 MG/3ML) 0.083% nebulizer solution Take 2.5 mg by nebulization every 6 (six) hours as needed for wheezing or shortness of breath.    Yes Historical Provider, MD  allopurinol (ZYLOPRIM) 300 MG tablet Take 300 mg by mouth daily.     Yes Historical Provider, MD  ALPRAZolam Prudy Feeler) 1 MG tablet Take 1 mg by mouth 4 (four) times daily.  Yes Historical Provider, MD  benazepril (LOTENSIN) 5 MG tablet Take 5 mg by mouth daily.   Yes Historical Provider, MD  canagliflozin (INVOKANA) 100 MG TABS tablet Take 100 mg by mouth daily.    Yes Historical Provider, MD  diphenhydrAMINE (BENADRYL) 25 mg capsule Take 50 mg by mouth every 6 (six) hours as needed for itching.   Yes Historical Provider, MD  HYDROcodone-acetaminophen (NORCO) 10-325 MG per tablet Take 1 tablet by mouth 4 (four) times daily.   Yes Historical Provider, MD  indomethacin (INDOCIN) 50 MG capsule Take 50 mg by mouth daily.    Yes Historical Provider, MD  insulin  aspart (NOVOLOG FLEXPEN) 100 UNIT/ML FlexPen Inject 16-18 Units into the skin 3 (three) times daily with meals. On sliding scale   Yes Historical Provider, MD  insulin glargine (LANTUS) 100 UNIT/ML injection Inject 80 Units into the skin at bedtime.    Yes Historical Provider, MD  Liraglutide (VICTOZA) 18 MG/3ML SOPN Inject 1.8 Units into the skin every evening.    Yes Historical Provider, MD  metFORMIN (GLUCOPHAGE) 500 MG tablet Take 1,000 mg by mouth 2 (two) times daily with a meal.    Yes Historical Provider, MD  Multiple Vitamin (MULTIVITAMIN WITH MINERALS) TABS tablet Take 1 tablet by mouth daily.   Yes Historical Provider, MD  pravastatin (PRAVACHOL) 40 MG tablet Take 40 mg by mouth daily.   Yes Historical Provider, MD  tiotropium (SPIRIVA) 18 MCG inhalation capsule Place 18 mcg into inhaler and inhale daily.   Yes Historical Provider, MD  verapamil (CALAN-SR) 240 MG CR tablet Take 240 mg by mouth daily.   Yes Historical Provider, MD  Vitamin D, Ergocalciferol, (DRISDOL) 50000 UNITS CAPS capsule Take 50,000 Units by mouth every 7 (seven) days. On Tuesday's.   Yes Historical Provider, MD  zolpidem (AMBIEN) 10 MG tablet Take 10 mg by mouth at bedtime as needed for sleep.    Yes Historical Provider, MD  clindamycin (CLEOCIN) 300 MG capsule Take 1 capsule (300 mg total) by mouth 4 (four) times daily. X 7 days 09/08/14   Melene Plan, DO   BP 154/53 mmHg  Pulse 85  Temp(Src) 97.8 F (36.6 C) (Oral)  Resp 16  Ht 6' (1.829 m)  Wt 400 lb (181.439 kg)  BMI 54.24 kg/m2  SpO2 98% Physical Exam  Constitutional: He is oriented to person, place, and time. He appears well-developed and well-nourished. No distress.  HENT:  Head: Normocephalic and atraumatic.  Eyes: Conjunctivae and EOM are normal.  Neck: Neck supple. No tracheal deviation present.  Cardiovascular: Normal rate.   Pulmonary/Chest: Effort normal. No respiratory distress.  Musculoskeletal: Normal range of motion.  Neurological: He is  alert and oriented to person, place, and time.  Skin: Skin is warm and dry.  Quarter-sized area of erythema and point tenderness to posterior medial aspect of right calf. No noted swelling otherwise to the leg. 2+ pulses.  Psychiatric: He has a normal mood and affect. His behavior is normal.  Nursing note and vitals reviewed.   ED Course  Procedures (including critical care time)  DIAGNOSTIC STUDIES: Oxygen Saturation is 98% on RA, normal by my interpretation.    COORDINATION OF CARE: 9:38 AM - Discussed treatment plan with pt at bedside which includes u/s to search for drainable pus pocket, and Rx antibiotics. Pt verbalized understanding and agreed to plan.    Labs Review Labs Reviewed - No data to display  Imaging Review No results found. I have personally reviewed and  evaluated these images and lab results as part of my medical decision-making.   EKG Interpretation None      Emergency Focused Ultrasound Exam Limited Ultrasound of Soft Tissue   Performed and interpreted by Dr. Adela Lank Indication: evaluation for infection or foreign body Transverse and Sagittal views of  are obtained in real time for the purposes of evaluation of skin and underlying soft tissues.  Findings:   hyperemia/edema of surrounding tissue Interpretation: no abscess, + cellulitis Images archived electronically.  CPT Codes:  Lower extremity [id number]     MDM   Final diagnoses:  Cellulitis of right lower extremity    54 yo M with a chief complaint of right calf pain. Start a couple days ago. Patient now noted that there is a welt there. Felt like it was warm to the touch. Not systemically ill.   Ultrasound with cellulitis with no abscess. Will start on antibiotics.   I personally performed the services described in this documentation, which was scribed in my presence. The recorded information has been reviewed and is accurate.    I have discussed the diagnosis/risks/treatment options with  the patient and family and believe the pt to be eligible for discharge home to follow-up with PCP. We also discussed returning to the ED immediately if new or worsening sx occur. We discussed the sx which are most concerning (e.g., sudden worsening pain, fever, nausea) that necessitate immediate return. Medications administered to the patient during their visit and any new prescriptions provided to the patient are listed below.  Medications given during this visit Medications - No data to display  Discharge Medication List as of 09/08/2014 10:18 AM    START taking these medications   Details  clindamycin (CLEOCIN) 300 MG capsule Take 1 capsule (300 mg total) by mouth 4 (four) times daily. X 7 days, Starting 09/08/2014, Until Discontinued, Print         The patient appears reasonably screen and/or stabilized for discharge and I doubt any other medical condition or other [facility] requiring further screening, evaluation, or treatment in the ED at this time prior to discharge.     Melene Plan, DO 09/08/14 1428

## 2014-10-14 ENCOUNTER — Encounter: Payer: Self-pay | Admitting: Allergy and Immunology

## 2014-10-14 ENCOUNTER — Ambulatory Visit (INDEPENDENT_AMBULATORY_CARE_PROVIDER_SITE_OTHER): Payer: Medicaid Other | Admitting: Allergy and Immunology

## 2014-10-14 VITALS — BP 146/72 | HR 78 | Temp 98.8°F | Resp 16 | Ht 69.92 in | Wt >= 6400 oz

## 2014-10-14 DIAGNOSIS — J309 Allergic rhinitis, unspecified: Secondary | ICD-10-CM

## 2014-10-14 DIAGNOSIS — J3089 Other allergic rhinitis: Secondary | ICD-10-CM

## 2014-10-14 DIAGNOSIS — Z889 Allergy status to unspecified drugs, medicaments and biological substances status: Secondary | ICD-10-CM

## 2014-10-14 DIAGNOSIS — L509 Urticaria, unspecified: Secondary | ICD-10-CM | POA: Diagnosis not present

## 2014-10-14 NOTE — Patient Instructions (Signed)
Take Home Sheet  1. Avoidance: Mold   Of all Cephalosporin medications.   Of Invokana.  2. Antihistamine: Claritin   by mouth once daily for runny nose or itching as needed.   3. Nasal Spray: Saline 2 spray(s) each nostril once daily for stuffy nose or drainage as needed.   4.  If new episodes of hives take picture, write down environment, exposure, activity and ingestion.  5.  Obtain selected labs at [facility] as discussed.   6. Follow up Visit: 2-3 months or sooner if needed.   Websites that have reliable Patient information: 1. American Academy of Asthma, Allergy, & Immunology: www.aaaai.org 2. Food Allergy Network: www.foodallergy.org 3. Mothers of Asthmatics: www.aanma.org 4. [facility]: https://www.strong.com/ 5. American College of Allergy, Asthma, & Immunology: BiggerRewards.is or www.acaai.org

## 2014-10-16 NOTE — Progress Notes (Deleted)
FOLLOW UP NOTE  RE: Aaron Potter MRN: [id number] DOB: 09/30/1960 @DEPARTMENT  NAME@ Date of Office Visit: 10/14/2014  Subjective:  Aaron Potter is a 54 y.o. male who presents today for Urticaria; Allergic Reaction; and Pruritis   HPI: ***  Drug Allergies: Allergies  Allergen Reactions  . Biaxin [Clarithromycin] Anaphylaxis and Shortness Of Breath  . Peanut-Containing Drug Products Anaphylaxis    Objective:   Filed Vitals:   10/14/14 1433  BP: 146/72  Pulse: 78  Temp: 98.8 F (37.1 C)  Resp: 16   Physical Exam  Diagnostics: ***  Assessment:   1. Hives   2. Perennial allergic rhinitis    Plan:  No orders of the defined types were placed in this encounter.   Patient Instructions  Take Home Sheet  1. Avoidance: Mold   Of all Cephalosporin medications.   Of Invokana.  2. Antihistamine: Claritin 10mg   by mouth once daily for runny nose or itching as needed.   3. Nasal Spray: Saline 2 spray(s) each nostril once daily for stuffy nose or drainage as needed.   4.  If new episodes of hives take picture, write down environment, exposure, activity and ingestion.  5.  [facility] as discussed.   6. Follow up Visit: 2-3 months or sooner if needed.   Websites that have reliable Patient information: 1. American Academy of Asthma, Allergy, & Immunology: www.aaaai.org 2. Food Allergy Network: www.foodallergy.org 3. Mothers of Asthmatics: www.aanma.org 4. [facility]: https://www.strong.com/www.njc.org 5. American College of Allergy, Asthma, & Immunology: BiggerRewards.iswww.allergy.[employer].edu or www.acaai.org   No Follow-up on file.  Bain Whichard M. Willa RoughHicks, MD

## 2014-10-19 NOTE — Progress Notes (Signed)
NEW PATIENT NOTE  RE: Aaron BihariGeorge Potter MRN: [id number] DOB: 02/28/1960 [facility] [place] [address] [place], [place] [address] Date of Office Visit: 10/14/2014  Referring provider: Kari BaarsEdward Hawkins, MD [address] [place], [place] [address]  Subjective:  Aaron BihariGeorge Potter is a 54 y.o. male who presents today for Urticaria; Allergic Reaction; and Pruritis   HPI:  Aaron StallionGeorge is a 54 year old with a complex medical history including COPD, hypertension, morbid obesity and gout on multiple medication regime who presents with recent difficulty with hives.  He describes recurring difficulties in the month of August, prompting several emergency department visits.  The first, he recalls completing  5 days of Keflex for an upper respiratory infection with resultant generalized hives, head to toe.  He was evaluated in the ED, who prescribed several medications of which he believes prednisone and an injection with discontinuation of Keflex.  Eventually,  he was feeling well.  No swelling of his lips, tongue or throat, nor dysphagia, shortness of breath or difficulty breathing.  His second visit was approximately a week later when he described difficulty with his throat tightening with voice changing.  He initially thought there were related breathing changes.  The coldness of his late night snack appeared to be the trigger--approximate 9pm--eating butter pecan ice cream.  He feels the symptoms began before the snack was finished and when decided to take Benadryl but had difficulty swallowing, went back to the ED for further evaluation.  He is not sure what he had for dinner earlier that evening.  He denied any hives, coughing, wheezing, other skin changes or concerns.  He understands he was treated with Benadryl and additional medication and epinephrine injection.  In reflection, he remembers holding cold drinks to his arm would result in hives areas on  a few occasions.  His third visit to the emergency department was related to hives at his chest/sides approximately midday but unsure of provoking factors or specific environment correlating with the time a day.  He believes breakfast would have been an egg sandwich, and lunch a ham, bologna or Malaw[place]urkey sandwich.  Symptoms seemed to  resolve fairly quickly with Benadryl in the ED.  The last visit, he remembers was related to a red area at his lower extremity which began to increase in size over several days.  He returned to the ED and was treated with another antibiotic for cellulitis (clindamycin), where he did well.  He does recall a tick bites in 2010, but believes he was diagnosed and treated [facility] at that time.  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  He remembers in July, beginning Palisadenvokana and having difficulty tolerating related to urinary retention and with retry wondered if that was related to any of the recurring episodes of hives.  (off Invokana for the last month.)  He has no recent history of NSAID use or known sensitivity including aspirin, stinging insects, latex or jewelry sensitivity.  Medical History: Past Medical History  Diagnosis Date  . Diabetes mellitus without complication (HCC)   . Asthma   . COPD (chronic obstructive pulmonary disease) (HCC)   . Hypertension   . Gout   . Sleep apnea   . Balanitis   . Cellulitis   . Lichen sclerosus   . Obesity, morbid (more than 100 lbs over ideal weight or BMI > 40) (HCC)   . Urticaria  Surgical History: Past Surgical History  Procedure Laterality Date  . Cholecystectomy     Family History: Family History  Problem Relation Age of Onset  . Urticaria Sister    Social History: Social History   Social History  . Marital Status: Single    Spouse Name: N/A  . Number of Children: N/A  . Years of Education: N/A   Occupational History  . Not on file.   Social  History Main Topics  . Smoking status: Never Smoker   . Smokeless tobacco: Never Used  . Alcohol Use: No  . Drug Use: No  . Sexual Activity: Yes      .    Social History Narrative    Medications prior to this encounter: Outpatient Prescriptions Prior to Visit  Medication Sig Dispense Refill  . albuterol (PROVENTIL) (2.5 MG/3ML) 0.083% nebulizer solution Take 2.5 mg by nebulization every 6 (six) hours as needed for wheezing or shortness of breath.     . allopurinol (ZYLOPRIM) 300 MG tablet Take 300 mg by mouth daily.      Marland Kitchen ALPRAZolam (XANAX) 1 MG tablet Take 1 mg by mouth 4 (four) times daily.      . benazepril (LOTENSIN) 5 MG tablet Take 5 mg by mouth daily.    . diphenhydrAMINE (BENADRYL) 25 mg capsule Take 50 mg by mouth every 6 (six) hours as needed for itching.    Marland Kitchen HYDROcodone-acetaminophen (NORCO) 10-325 MG per tablet Take 1 tablet by mouth 4 (four) times daily.    . indomethacin (INDOCIN) 50 MG capsule Take 50 mg by mouth daily.     . insulin aspart (NOVOLOG FLEXPEN) 100 UNIT/ML FlexPen Inject 16-18 Units into the skin 3 (three) times daily with meals. On sliding scale    . insulin glargine (LANTUS) 100 UNIT/ML injection Inject 80 Units into the skin at bedtime.     . Liraglutide (VICTOZA) 18 MG/3ML SOPN Inject 1.8 Units into the skin every evening.     . metFORMIN (GLUCOPHAGE) 500 MG tablet Take 1,000 mg by mouth 2 (two) times daily with a meal.     . Multiple Vitamin (MULTIVITAMIN WITH MINERALS) TABS tablet Take 1 tablet by mouth daily.    . pravastatin (PRAVACHOL) 40 MG tablet Take 40 mg by mouth daily.    Marland Kitchen tiotropium (SPIRIVA) 18 MCG inhalation capsule Place 18 mcg into inhaler and inhale daily.    . verapamil (CALAN-SR) 240 MG CR tablet Take 240 mg by mouth daily.    . Vitamin D, Ergocalciferol, (DRISDOL) 50000 UNITS CAPS capsule Take 50,000 Units by mouth every 7 (seven) days. On Tuesday's.    . zolpidem (AMBIEN) 10 MG tablet Take 10 mg by mouth at bedtime as needed for  sleep.     Marland Kitchen albuterol (PROVENTIL HFA;VENTOLIN HFA) 108 (90 BASE) MCG/ACT inhaler Inhale 2 puffs into the lungs 2 (two) times daily as needed for wheezing or shortness of breath.    . canagliflozin (INVOKANA) 100 MG TABS tablet Take 100 mg by mouth daily.     . clindamycin (CLEOCIN) 300 MG capsule Take 1 capsule (300 mg total) by mouth 4 (four) times daily. X 7 days (Patient not taking: Reported on 10/14/2014) 28 capsule 0   No facility-administered medications prior to visit.    Drug Allergies: Allergies  Allergen Reactions  . Biaxin [Clarithromycin] Anaphylaxis and Shortness Of Breath  . Peanut-Containing Drug Products Anaphylaxis    Environmental History: Izayah lives in a 52 year old house 6 years with tile  floors, central air and heat, indoor dog without humidifier or smokers.  Non feather pillow and comforter with stuffed mattress in carpeted bedroom.  Review of Systems  Constitutional: Negative for fever, chills, weight loss, malaise/fatigue and diaphoresis.  HENT: Positive for congestion. Negative for hearing loss, nosebleeds and sore throat.   Eyes: Negative for pain, discharge and redness.       Corrective eyeglass lenses for 15 years  Respiratory: Positive for cough. Negative for hemoptysis, sputum production, shortness of breath and stridor.   Cardiovascular: Negative for chest pain and palpitations.  Gastrointestinal: Negative for heartburn, nausea, vomiting, abdominal pain, diarrhea and constipation.  Musculoskeletal: Negative for myalgias and neck pain.  Skin:       Resolved hives--currently clear skin  Neurological: Negative for dizziness, seizures, weakness and headaches.  Endo/Heme/Allergies: Positive for environmental allergies.    Objective:   Filed Vitals:   10/14/14 1433  BP: 146/72  Pulse: 78  Temp: 98.8 F (37.1 C)  Resp: 16   Physical Exam  Constitutional:  Alert interactive in no acute distress  HENT:  Head: Atraumatic.  Right Ear: Tympanic  membrane and ear canal normal.  Left Ear: Tympanic membrane and ear canal normal.  Nose: Mucosal edema present. No rhinorrhea or sinus tenderness. No epistaxis.  Mouth/Throat: Oropharynx is clear and moist and mucous membranes are normal.  Cardiovascular: Normal rate, S1 normal and S2 normal.   Pulmonary/Chest: He has no wheezes. He has no rhonchi. He has no rales.  Abdominal: Soft. There is no tenderness.  obese  Lymphadenopathy:    He has no cervical adenopathy.  Skin: Skin is warm, dry and intact. No rash noted. No cyanosis. Nails show no clubbing.  No dermatographism    Diagnostics: Skin testing:  Mild reactivity via intradermal testing to aspergillus and penicillium mix, otherwise negative including selected foods, peanut, pecan and other tree nuts.  Assessment:   1. Hives with clear skin today.   2. Perennial allergic rhinitis.   3. Drug allergy--likely Keflex and Invokana hypersensitivity.  4.      Negative selected food testing today. 5.      Complex medical history on multiple medication regime including ACE inhibitor. 6.      History of COPD as managed by Dr. [name]. Plan:     Patient Instructions  Take Home Sheet  1. Avoidance: Mold   Of all Cephalosporin medications.   Of Invokana.  2. Antihistamine: Claritin   by mouth once daily for runny nose or itching as needed.  3. Nasal Spray: Saline 2 spray(s) each nostril once daily for stuffy nose or drainage as needed.   4.  If new episodes of hives take picture, write down environment, exposure, activity and ingestion.  5.  Obtain selected labs at [facility] as discussed.      Continue current respiratory medications per Dr. [name].   6. Follow up Visit: 2-3 months or sooner if needed.   Websites that have reliable Patient information: 1. American Academy of Asthma, Allergy, & Immunology: www.aaaai.org 2. Food Allergy Network: www.foodallergy.org 3. Mothers of Asthmatics: www.aanma.org 4. [facility]: https://www.strong.com/ 5. American College of Allergy, Asthma, & Immunology: BiggerRewards.is or www.acaai.org      Roselyn M. Willa Rough, MD   cc: Kari Baars, MD

## 2014-10-21 ENCOUNTER — Other Ambulatory Visit: Payer: Self-pay | Admitting: "Endocrinology

## 2014-10-29 ENCOUNTER — Other Ambulatory Visit: Payer: Self-pay | Admitting: Allergy and Immunology

## 2014-10-30 LAB — IGE: IgE (Immunoglobulin E), Serum: <2 kU/L (ref <115)

## 2014-12-09 LAB — HEMOGLOBIN A1C: HEMOGLOBIN A1C: 9.1 % — AB (ref 4.0–6.0)

## 2014-12-16 ENCOUNTER — Encounter: Payer: Self-pay | Admitting: "Endocrinology

## 2014-12-16 ENCOUNTER — Ambulatory Visit (INDEPENDENT_AMBULATORY_CARE_PROVIDER_SITE_OTHER): Payer: Medicaid Other | Admitting: Allergy and Immunology

## 2014-12-16 ENCOUNTER — Ambulatory Visit (INDEPENDENT_AMBULATORY_CARE_PROVIDER_SITE_OTHER): Payer: Medicaid Other | Admitting: "Endocrinology

## 2014-12-16 ENCOUNTER — Telehealth: Payer: Self-pay

## 2014-12-16 ENCOUNTER — Encounter: Payer: Self-pay | Admitting: Allergy and Immunology

## 2014-12-16 VITALS — BP 146/80 | HR 78 | Temp 98.0°F | Resp 18

## 2014-12-16 VITALS — BP 170/84 | HR 73 | Ht 72.0 in | Wt >= 6400 oz

## 2014-12-16 DIAGNOSIS — I10 Essential (primary) hypertension: Secondary | ICD-10-CM

## 2014-12-16 DIAGNOSIS — Z794 Long term (current) use of insulin: Secondary | ICD-10-CM | POA: Diagnosis not present

## 2014-12-16 DIAGNOSIS — L509 Urticaria, unspecified: Secondary | ICD-10-CM

## 2014-12-16 DIAGNOSIS — E782 Mixed hyperlipidemia: Secondary | ICD-10-CM | POA: Insufficient documentation

## 2014-12-16 DIAGNOSIS — E1165 Type 2 diabetes mellitus with hyperglycemia: Secondary | ICD-10-CM | POA: Diagnosis not present

## 2014-12-16 DIAGNOSIS — R05 Cough: Secondary | ICD-10-CM

## 2014-12-16 DIAGNOSIS — E1151 Type 2 diabetes mellitus with diabetic peripheral angiopathy without gangrene: Secondary | ICD-10-CM

## 2014-12-16 DIAGNOSIS — E785 Hyperlipidemia, unspecified: Secondary | ICD-10-CM

## 2014-12-16 DIAGNOSIS — IMO0002 Reserved for concepts with insufficient information to code with codable children: Secondary | ICD-10-CM

## 2014-12-16 DIAGNOSIS — R059 Cough, unspecified: Secondary | ICD-10-CM

## 2014-12-16 MED ORDER — INSULIN GLARGINE 100 UNIT/ML SOLOSTAR PEN: 80.0000 [IU] | PEN_INJECTOR | Freq: Every day | SUBCUTANEOUS | Status: DC

## 2014-12-16 MED ORDER — INSULIN ASPART 100 UNIT/ML FLEXPEN: 20.0000 [IU] | PEN_INJECTOR | Freq: Three times a day (TID) | SUBCUTANEOUS | Status: DC

## 2014-12-16 MED ORDER — GLUCOSE BLOOD VI STRP: ORAL_STRIP | Status: DC

## 2014-12-16 NOTE — Patient Instructions (Addendum)
Take Home Sheet   1.  Continue current medication regime--labs as previously planned with Dr. Fransico HimNida tests in March.  2.  Monitor closely for any additional skin concerns.    3. Follow up Visit:  Here if new skin episodes.           Continue follow-up with Dr. Juanetta GoslingHawkins as planned.   Websites that have reliable Patient information: 1. American Academy of Asthma, Allergy, & Immunology: www.aaaai.org 2. Food Allergy Network: www.foodallergy.org 3. Mothers of Asthmatics: www.aanma.org 4. [facility]: https://www.strong.com/www.njc.org 5. American College of Allergy, Asthma, & Immunology: BiggerRewards.iswww.allergy.mcg.edu or www.acaai.org

## 2014-12-16 NOTE — Progress Notes (Signed)
Subjective:    Patient ID: Aaron Potter, male    DOB: 31-Mar-1960, PCP Fredirick Maudlin, MD   Past Medical History  Diagnosis Date  . Diabetes mellitus without complication (HCC)   . Asthma   . COPD (chronic obstructive pulmonary disease) (HCC)   . Hypertension   . Gout   . Sleep apnea   . Balanitis   . Cellulitis   . Lichen sclerosus   . Obesity, morbid (more than 100 lbs over ideal weight or BMI > 40) (HCC)   . Urticaria    Past Surgical History  Procedure Laterality Date  . Cholecystectomy     Social History   Social History  . Marital Status: Single    Spouse Name: N/A  . Number of Children: N/A  . Years of Education: N/A   Social History Main Topics  . Smoking status: Never Smoker   . Smokeless tobacco: Never Used  . Alcohol Use: No  . Drug Use: No  . Sexual Activity: Yes   Other Topics Concern  . Not on file   Social History Narrative   Outpatient Encounter Prescriptions as of 12/16/2014  Medication Sig  . albuterol (PROAIR HFA) 108 (90 BASE) MCG/ACT inhaler Inhale 2 puffs into the lungs every 4 (four) hours as needed for wheezing or shortness of breath. Sometimes only uses one puff  . albuterol (PROVENTIL HFA;VENTOLIN HFA) 108 (90 BASE) MCG/ACT inhaler Inhale 2 puffs into the lungs 2 (two) times daily as needed for wheezing or shortness of breath.  . allopurinol (ZYLOPRIM) 300 MG tablet Take 300 mg by mouth daily.    Marland Kitchen ALPRAZolam (XANAX) 1 MG tablet Take 1 mg by mouth 4 (four) times daily.    . benazepril (LOTENSIN) 5 MG tablet Take 5 mg by mouth daily.  . diphenhydrAMINE (BENADRYL) 25 mg capsule Take 50 mg by mouth every 6 (six) hours as needed for itching.  Marland Kitchen HYDROcodone-acetaminophen (NORCO) 10-325 MG per tablet Take 1 tablet by mouth 4 (four) times daily.  . indomethacin (INDOCIN) 50 MG capsule Take 50 mg by mouth daily.   . insulin aspart (NOVOLOG FLEXPEN) 100 UNIT/ML FlexPen Inject 20-26 Units into the skin 3 (three) times daily with meals.  .  Insulin Glargine (LANTUS SOLOSTAR) 100 UNIT/ML Solostar Pen Inject 80 Units into the skin at bedtime.  . Liraglutide (VICTOZA) 18 MG/3ML SOPN Inject 1.8 Units into the skin every evening.   . metFORMIN (GLUCOPHAGE) 500 MG tablet Take 1,000 mg by mouth 2 (two) times daily with a meal.   . Multiple Vitamin (MULTIVITAMIN WITH MINERALS) TABS tablet Take 1 tablet by mouth daily.  . pravastatin (PRAVACHOL) 40 MG tablet Take 40 mg by mouth daily.  Marland Kitchen tiotropium (SPIRIVA) 18 MCG inhalation capsule Place 18 mcg into inhaler and inhale daily.  . verapamil (CALAN-SR) 240 MG CR tablet Take 240 mg by mouth daily.  . Vitamin D, Ergocalciferol, (DRISDOL) 50000 UNITS CAPS capsule Take 50,000 Units by mouth every 7 (seven) days. On Tuesday's.  . zolpidem (AMBIEN) 10 MG tablet Take 10 mg by mouth at bedtime as needed for sleep.   . [DISCONTINUED] Insulin Glargine (LANTUS SOLOSTAR [place]) Inject 80 Units into the skin at bedtime.  . [DISCONTINUED] insulin glargine (LANTUS) 100 UNIT/ML injection Inject 80 Units into the skin at bedtime.  . [DISCONTINUED] NOVOLOG FLEXPEN 100 UNIT/ML FlexPen USE UP TO 20 UNITS 3 TIMES A DAY BEFORE MEALS ,SUB-Q AS DIRECTED. (Patient taking differently: USE UP TO 22 UNITS 3 TIMES A  DAY BEFORE MEALS ,SUB-Q AS DIRECTED.)  . clindamycin (CLEOCIN) 300 MG capsule Take 1 capsule (300 mg total) by mouth 4 (four) times daily. X 7 days (Patient not taking: Reported on 10/14/2014)  . glucose blood (ACCU-CHEK AVIVA) test strip Use 4 x a day to test BG  . [DISCONTINUED] albuterol (PROVENTIL) (2.5 MG/3ML) 0.083% nebulizer solution Take 2.5 mg by nebulization every 6 (six) hours as needed for wheezing or shortness of breath.   . [DISCONTINUED] canagliflozin (INVOKANA) 100 MG TABS tablet Take 100 mg by mouth daily.    No facility-administered encounter medications on file as of 12/16/2014.   ALLERGIES: Allergies  Allergen Reactions  . Biaxin [Clarithromycin] Anaphylaxis and Shortness Of Breath  .  Peanut-Containing Drug Products Anaphylaxis   VACCINATION STATUS:  There is no immunization history on file for this patient.  Diabetes He presents for his follow-up diabetic visit. He has type 2 diabetes mellitus. Onset time: He was diagnosed at approximate age of 54 years. His disease course has been worsening. There are no hypoglycemic associated symptoms. Pertinent negatives for hypoglycemia include no confusion, headaches, pallor or seizures. Associated symptoms include polydipsia and polyuria. Pertinent negatives for diabetes include no chest pain, no fatigue, no polyphagia and no weakness. There are no hypoglycemic complications. Symptoms are worsening. Diabetic complications include PVD. Risk factors for coronary artery disease include dyslipidemia, diabetes mellitus, family history, male sex, obesity and sedentary lifestyle. Current diabetic treatment includes intensive insulin program. He is compliant with treatment most of the time. His weight is stable. He is following a generally unhealthy diet. He has had a previous visit with a dietitian. He never participates in exercise. His home blood glucose trend is increasing steadily. His breakfast blood glucose range is generally >200 mg/dl. His lunch blood glucose range is generally >200 mg/dl. His dinner blood glucose range is generally >200 mg/dl. His overall blood glucose range is >200 mg/dl. An ACE inhibitor/angiotensin II receptor blocker is being taken.  Hyperlipidemia This is a chronic problem. The current episode started more than 1 year ago. The problem is uncontrolled. Exacerbating diseases include diabetes and obesity. Pertinent negatives include no chest pain, myalgias or shortness of breath. Current antihyperlipidemic treatment includes statins. Risk factors for coronary artery disease include diabetes mellitus, dyslipidemia, hypertension, male sex, obesity and a sedentary lifestyle.  Hypertension This is a chronic problem. The current  episode started more than 1 year ago. The problem is uncontrolled. Pertinent negatives include no chest pain, headaches, neck pain, palpitations or shortness of breath. Past treatments include ACE inhibitors and calcium channel blockers. The current treatment provides no improvement. Compliance problems include diet.  Hypertensive end-organ damage includes PVD.     Review of Systems  Constitutional: Negative for fatigue and unexpected weight change.  HENT: Negative for dental problem, mouth sores and trouble swallowing.   Eyes: Negative for visual disturbance.  Respiratory: Negative for cough, choking, chest tightness, shortness of breath and wheezing.   Cardiovascular: Negative for chest pain, palpitations and leg swelling.  Gastrointestinal: Negative for nausea, vomiting, abdominal pain, diarrhea, constipation and abdominal distention.  Endocrine: Positive for polydipsia and polyuria. Negative for polyphagia.  Genitourinary: Negative for dysuria, urgency, hematuria and flank pain.  Musculoskeletal: Negative for myalgias, back pain, gait problem and neck pain.  Skin: Negative for pallor, rash and wound.  Neurological: Negative for seizures, syncope, weakness, numbness and headaches.  Psychiatric/Behavioral: Negative.  Negative for confusion and dysphoric mood.    Objective:    BP 170/84 mmHg  Pulse 73  Ht 6' (1.829 m)  Wt 408 lb (185.068 kg)  BMI 55.32 kg/m2  SpO2 97%  Wt Readings from Last 3 Encounters:  12/16/14 408 lb (185.068 kg)  10/14/14 411 lb 3.2 oz (186.519 kg)  09/08/14 400 lb (181.439 kg)    Physical Exam  Constitutional: He is oriented to person, place, and time. He appears well-developed and well-nourished. He is cooperative. No distress.  HENT:  Head: Normocephalic and atraumatic.  Eyes: EOM are normal.  Neck: Normal range of motion. Neck supple. No tracheal deviation present. No thyromegaly present.  Cardiovascular: Normal rate, S1 normal, S2 normal and normal  heart sounds.  Exam reveals no gallop.   No murmur heard. Pulses:      Dorsalis pedis pulses are 0 on the right side, and 0 on the left side.       Posterior tibial pulses are 0 on the right side, and 0 on the left side.  Pulmonary/Chest: Breath sounds normal. No respiratory distress. He has no wheezes.  Abdominal: Soft. Bowel sounds are normal. He exhibits no distension. There is no tenderness. There is no guarding and no CVA tenderness.  Musculoskeletal: He exhibits no edema.       Right shoulder: He exhibits no swelling and no deformity.  Neurological: He is alert and oriented to person, place, and time. He has normal strength and normal reflexes. No cranial nerve deficit or sensory deficit. Gait normal.  Skin: Skin is warm and dry. No rash noted. No cyanosis. Nails show no clubbing.  Psychiatric: He has a normal mood and affect. His speech is normal and behavior is normal. Judgment and thought content normal. Cognition and memory are normal.    Results for orders placed or performed in visit on 12/16/14  Hemoglobin A1c  Result Value Ref Range   Hgb A1c MFr Bld 9.1 (A) 4.0 - 6.0 %   Complete Blood Count (Most recent): Lab Results  Component Value Date   WBC 9.3 04/12/2013   HGB 13.0 04/12/2013   HCT 39.2 04/12/2013   MCV 91.4 04/12/2013   PLT 287 04/12/2013   Chemistry (most recent): Lab Results  Component Value Date   NA 141 04/12/2013   K 4.6 04/12/2013   CL 104 04/12/2013   CO2 27 04/12/2013   BUN 10 04/12/2013   CREATININE 0.71 04/12/2013   Diabetic Labs (most recent): Lab Results  Component Value Date   HGBA1C 9.1* 12/09/2014   HGBA1C 7.9* 04/06/2013     Assessment & Plan:   1. Uncontrolled type 2 diabetes mellitus with diabetic peripheral angiopathy without gangrene, with long-term current use of insulin (HCC) - patient remains at a high risk for more acute and chronic complications of diabetes which include CAD, CVA, CKD, retinopathy, and neuropathy. These  are all discussed in detail with the patient.  Patient came with persistently above target glucose profile, and  recent A1c of 9.1 %.  Glucose logs and insulin administration records pertaining to this visit,  to be scanned into patient's records.  Recent labs reviewed.   - I have re-counseled the patient on diet management and weight loss  by adopting a carbohydrate restricted / protein rich  Diet.  - Suggestion is made for patient to avoid simple carbohydrates   from their diet including Cakes , Desserts, Ice Cream,  Soda (  diet and regular) , Sweet Tea , Candies,  Chips, Cookies, Artificial Sweeteners,   and "Sugar-free" Products .  This will help patient to have stable blood  glucose profile and potentially avoid unintended  Weight gain.  - Patient is advised to stick to a routine mealtimes to eat 3 meals  a day and avoid unnecessary snacks ( to snack only to correct hypoglycemia).  - The patient  has been  scheduled with Norm Salt, RDN, CDE for individualized DM education.  - I have approached patient with the following individualized plan to manage diabetes and patient agrees. -Continue Lantus 80 units qhs, increase  Novolog to 20 units TIDAC plus correction dose , with strict monitoring of BG AC and HS .   -Patient is encouraged to call clinic for blood glucose levels less than 70 or above 300 mg /dl. -I will continue Metformin 1gm PO BID and Victoza 1.8 mg qday. -He is following with Norm Salt, CDE for DM education. - He did not tolerate  Invokana , was d/ced.  - Patient specific target  for A1c; LDL, HDL, Triglycerides, and  Waist Circumference were discussed in detail.  2) BP/HTN: Uncontrolled. Continue current medications including ACEI/ARB. 3) Lipids/HPL:  continue statins. 4)  Weight/Diet: CDE consult in progress, exercise, and carbohydrates information provided.  5) Chronic Care/Health Maintenance:  -Patient  Is  on ACEI/ARB and Statin medications and encouraged  to continue to follow up with Ophthalmology, Podiatrist at least yearly or according to recommendations, and advised to  stay away from smoking. I have recommended yearly flu vaccine and pneumonia vaccination at least every 5 years; moderate intensity exercise for up to 150 minutes weekly; and  sleep for at least 7 hours a day.  - 25 minutes of time was spent on the care of this patient , 50% of which was applied for counseling on diabetes complications and their preventions.  - I advised patient to maintain close follow up with HAWKINS,EDWARD L, MD for primary care needs.  Patient is asked to bring meter and  blood glucose logs during their next visit.   Follow up plan: -Return in about 4 weeks (around 01/13/2015) for diabetes, high blood pressure, high cholesterol, follow up with meter and logs- no labs.  Marquis Lunch, MD Phone: [phone]  Fax: [phone]   12/16/2014, 9:24 AM

## 2014-12-16 NOTE — Patient Instructions (Signed)

## 2014-12-16 NOTE — Progress Notes (Signed)
FOLLOW UP NOTE  RE: Aaron Potter MRN: [id number] DOB: 01/11/1960 [facility] [place] [address] [place] [id number] Date of Office Visit: 12/16/2014  Subjective:  Aaron BihariGeorge Potter is a 54 y.o. male who presents today for Follow-up of hives.   Assessment:   1. Hives without further episodes (clear skin today--likely medication related).  2. Allergic rhinitis.  3.      History of COPD with report of rare cough. 4.      Pending food specific IgE. 5.      Complex medical history on multiple medication regime.  Plan:   1.  Continue current medication regime--labs--food specific IgE as previously planned with next lab draw. 2.  Monitor closely for any additional skin concerns. 3.  Follow up Visit:  Here if new skin episodes.           Continue follow-up with Dr. Juanetta GoslingHawkins as planned.   HPI: Aaron Potter returns to the office in follow-up of hives and allergic rhinitis.  Since his initial visit, only partial labs were obtained.  Aaron Potter reports feeling very well and has had no skin concerns.  No new episodes of hives, acute reactions, skin episodes or other new concerns.  He has not used Claritin in many weeks since he felt he was doing so well.  He continues to follow medical management per Dr. Juanetta GoslingHawkins and on occasion notes a rare cough without wheeze, difficulty breathing or shortness of breath.  Currently has no nasal symptoms.  Denies ED or urgent care visits, prednisone or antibiotic courses. Reports sleep and activity are normal. No Benadryl or ProAir use.  Current Medications: 1.  Bendaryl, Pro Air HFA as needed. 2.  Continues Victoza, Xanax, Indocin,  Multivitamin, allopurinol, Benazepril, insulin, Glucophage, Pravachol, verapamil, vitamin D and Ambien. 3.  Spiriva one puff once daily. 4.  As needed Norco.  Drug Allergies: Allergies  Allergen Reactions  . Biaxin [Clarithromycin] Anaphylaxis and Shortness Of Breath   Objective:   Filed Vitals:   12/16/14 0932  BP: 146/80  Pulse: 78  Temp: 98 F (36.7 C)  Resp: 18   SpO2 Readings from Last 1 Encounters:  12/16/14 96%  ] Physical Exam  Constitutional: He is well-developed, well-nourished, and in no distress.  HENT:  Head: Atraumatic.  Right Ear: Tympanic membrane and ear canal normal.  Left Ear: Tympanic membrane and ear canal normal.  Nose: Mucosal edema present. No rhinorrhea. No epistaxis.  Mouth/Throat: Oropharynx is clear and moist and mucous membranes are normal. No oropharyngeal exudate, posterior oropharyngeal edema or posterior oropharyngeal erythema.  Eyes: Conjunctivae are normal.  Neck: Neck supple.  Cardiovascular: Normal rate, S1 normal and S2 normal.   No murmur heard. Pulmonary/Chest: Effort normal and breath sounds normal. He has no wheezes. He has no rhonchi. He has no rales.  Lymphadenopathy:    He has no cervical adenopathy.  Skin: Skin is warm and intact. No rash (no hives or other skin changes) noted. No cyanosis. Nails show no clubbing.    Diagnostics: Spirometry:  FVC  3.16-- 68%, FEV1 2.66--70%.    Roselyn M. Willa RoughHicks, MD  cc: Fredirick MaudlinHAWKINS,EDWARD L, MD

## 2014-12-16 NOTE — Telephone Encounter (Signed)
Tried to call patient to advise we have mailed another lab form due to the fact that the last labs performed didn't include these on the order for some reason.  Also, we are mailing his AVS for today's office visit because he left the office before receiving.  Dr. Esperanza RichtersHick's said it will be all right for the patient to get her labs drawn when he goes to get the ones for Dr. Fransico HimNida.  No answer when called.  Left voicemail to call our [place] [facility].

## 2014-12-23 NOTE — Telephone Encounter (Signed)
12/23/14 3:56 PM-  Tried to call patient.  No answer.  Left voicemail on his home voicemail.  Need to advise lab work is needed and orders have been mailed.

## 2014-12-25 ENCOUNTER — Telehealth: Payer: Self-pay

## 2014-12-25 NOTE — Telephone Encounter (Signed)
Mailed letter to patient's home address to contact our office.  We need to make sure patient got his lab orders to go get done and makes sure he received the AVS from the office visit on 12-16-14.

## 2014-12-25 NOTE — Telephone Encounter (Signed)
Tried to call patient about the lab order mailed to his address. No answer.  Left message on home phone answering machine.

## 2014-12-29 ENCOUNTER — Other Ambulatory Visit: Payer: Self-pay | Admitting: "Endocrinology

## 2015-01-02 NOTE — Telephone Encounter (Signed)
Patient Verified phone number is correct. FWD to milly.

## 2015-01-02 NOTE — Telephone Encounter (Signed)
Spoke with patient advised as written below per Trixie RudeLisa Collins has requisition with him he will gets labs drawn when he goes for Dr. Fransico HimNida lab drawn.

## 2015-01-15 ENCOUNTER — Encounter: Payer: Self-pay | Admitting: "Endocrinology

## 2015-01-15 ENCOUNTER — Ambulatory Visit (INDEPENDENT_AMBULATORY_CARE_PROVIDER_SITE_OTHER): Payer: Medicaid Other | Admitting: "Endocrinology

## 2015-01-15 VITALS — BP 144/82 | HR 101 | Ht 72.0 in | Wt >= 6400 oz

## 2015-01-15 DIAGNOSIS — E785 Hyperlipidemia, unspecified: Secondary | ICD-10-CM

## 2015-01-15 DIAGNOSIS — I10 Essential (primary) hypertension: Secondary | ICD-10-CM | POA: Diagnosis not present

## 2015-01-15 DIAGNOSIS — Z794 Long term (current) use of insulin: Secondary | ICD-10-CM

## 2015-01-15 DIAGNOSIS — E1165 Type 2 diabetes mellitus with hyperglycemia: Secondary | ICD-10-CM

## 2015-01-15 DIAGNOSIS — IMO0002 Reserved for concepts with insufficient information to code with codable children: Secondary | ICD-10-CM

## 2015-01-15 DIAGNOSIS — E1151 Type 2 diabetes mellitus with diabetic peripheral angiopathy without gangrene: Secondary | ICD-10-CM

## 2015-01-15 MED ORDER — INSULIN ASPART 100 UNIT/ML FLEXPEN: 22.0000 [IU] | PEN_INJECTOR | Freq: Three times a day (TID) | SUBCUTANEOUS | Status: DC

## 2015-01-15 MED ORDER — LIRAGLUTIDE 18 MG/3ML ~~LOC~~ SOPN: 1.8000 mg | PEN_INJECTOR | Freq: Every evening | SUBCUTANEOUS | Status: DC

## 2015-01-15 NOTE — Patient Instructions (Signed)

## 2015-01-15 NOTE — Progress Notes (Signed)
Subjective:    Patient ID: Aaron Potter, male    DOB: 23-Jul-1960, PCP Aaron Maudlin, MD   Past Medical History  Diagnosis Date  . Diabetes mellitus without complication (HCC)   . Asthma   . COPD (chronic obstructive pulmonary disease) (HCC)   . Hypertension   . Gout   . Sleep apnea   . Balanitis   . Cellulitis   . Lichen sclerosus   . Obesity, morbid (more than 100 lbs over ideal weight or BMI > 40) (HCC)   . Urticaria    Past Surgical History  Procedure Laterality Date  . Cholecystectomy     Social History   Social History  . Marital Status: Single    Spouse Name: N/A  . Number of Children: N/A  . Years of Education: N/A   Social History Main Topics  . Smoking status: Never Smoker   . Smokeless tobacco: Never Used  . Alcohol Use: No  . Drug Use: No  . Sexual Activity: Yes   Other Topics Concern  . None   Social History Narrative   Outpatient Encounter Prescriptions as of 01/15/2015  Medication Sig  . albuterol (PROAIR HFA) 108 (90 BASE) MCG/ACT inhaler Inhale 2 puffs into the lungs every 4 (four) hours as needed for wheezing or shortness of breath. Sometimes only uses one puff  . albuterol (PROVENTIL HFA;VENTOLIN HFA) 108 (90 BASE) MCG/ACT inhaler Inhale 2 puffs into the lungs 2 (two) times daily as needed for wheezing or shortness of breath.  . allopurinol (ZYLOPRIM) 300 MG tablet Take 300 mg by mouth daily.    Marland Kitchen ALPRAZolam (XANAX) 1 MG tablet Take 1 mg by mouth 4 (four) times daily.    . benazepril (LOTENSIN) 5 MG tablet TAKE (1) TABLET BY MOUTH ONCE DAILY.  . clindamycin (CLEOCIN) 300 MG capsule Take 1 capsule (300 mg total) by mouth 4 (four) times daily. X 7 days (Patient not taking: Reported on 12/16/2014)  . diphenhydrAMINE (BENADRYL) 25 mg capsule Take 50 mg by mouth every 6 (six) hours as needed for itching.  Marland Kitchen glucose blood (ACCU-CHEK AVIVA) test strip Use 4 x a day to test BG  . HYDROcodone-acetaminophen (NORCO) 10-325 MG per tablet Take 1  tablet by mouth 4 (four) times daily.  . indomethacin (INDOCIN) 50 MG capsule Take 50 mg by mouth daily.   . insulin aspart (NOVOLOG FLEXPEN) 100 UNIT/ML FlexPen Inject 22-28 Units into the skin 3 (three) times daily with meals.  . Insulin Glargine (LANTUS SOLOSTAR) 100 UNIT/ML Solostar Pen Inject 80 Units into the skin at bedtime.  . Liraglutide (VICTOZA) 18 MG/3ML SOPN Inject 0.3 mLs (1.8 mg total) into the skin every evening.  . metFORMIN (GLUCOPHAGE) 500 MG tablet Take 1,000 mg by mouth 2 (two) times daily with a meal.   . Multiple Vitamin (MULTIVITAMIN WITH MINERALS) TABS tablet Take 1 tablet by mouth daily.  . pravastatin (PRAVACHOL) 40 MG tablet Take 40 mg by mouth daily.  Marland Kitchen tiotropium (SPIRIVA) 18 MCG inhalation capsule Place 18 mcg into inhaler and inhale daily.  . verapamil (CALAN-SR) 240 MG CR tablet Take 240 mg by mouth daily.  . Vitamin D, Ergocalciferol, (DRISDOL) 50000 UNITS CAPS capsule TAKE 1 CAPSULE BY MOUTH ONCE A WEEK.  Marland Kitchen zolpidem (AMBIEN) 10 MG tablet Take 10 mg by mouth at bedtime as needed for sleep.   . [DISCONTINUED] insulin aspart (NOVOLOG FLEXPEN) 100 UNIT/ML FlexPen Inject 20-26 Units into the skin 3 (three) times daily with meals.  . [  DISCONTINUED] Liraglutide (VICTOZA) 18 MG/3ML SOPN Inject 1.8 Units into the skin every evening.    No facility-administered encounter medications on file as of 01/15/2015.   ALLERGIES: Allergies  Allergen Reactions  . Biaxin [Clarithromycin] Anaphylaxis and Shortness Of Breath  . Peanut-Containing Drug Products Anaphylaxis   VACCINATION STATUS:  There is no immunization history on file for this patient.  Diabetes He presents for his follow-up diabetic visit. He has type 2 diabetes mellitus. Onset time: He was diagnosed at approximate age of 70 years. His disease course has been improving. There are no hypoglycemic associated symptoms. Pertinent negatives for hypoglycemia include no confusion, headaches, pallor or seizures.  Associated symptoms include polydipsia and polyuria. Pertinent negatives for diabetes include no chest pain, no fatigue, no polyphagia and no weakness. There are no hypoglycemic complications. Symptoms are improving. Diabetic complications include PVD. Risk factors for coronary artery disease include dyslipidemia, diabetes mellitus, family history, male sex, obesity and sedentary lifestyle. Current diabetic treatment includes intensive insulin program. He is compliant with treatment most of the time. His weight is stable. He is following a generally unhealthy diet. He has had a previous visit with a dietitian. He never participates in exercise. His home blood glucose trend is increasing steadily. His breakfast blood glucose range is generally 180-200 mg/dl. His lunch blood glucose range is generally 180-200 mg/dl. His dinner blood glucose range is generally 180-200 mg/dl. His overall blood glucose range is 180-200 mg/dl. An ACE inhibitor/angiotensin II receptor blocker is being taken.  Hyperlipidemia This is a chronic problem. The current episode started more than 1 year ago. The problem is uncontrolled. Exacerbating diseases include diabetes and obesity. Pertinent negatives include no chest pain, myalgias or shortness of breath. Current antihyperlipidemic treatment includes statins. Risk factors for coronary artery disease include diabetes mellitus, dyslipidemia, hypertension, male sex, obesity and a sedentary lifestyle.  Hypertension This is a chronic problem. The current episode started more than 1 year ago. The problem is uncontrolled. Pertinent negatives include no chest pain, headaches, neck pain, palpitations or shortness of breath. Past treatments include ACE inhibitors and calcium channel blockers. The current treatment provides no improvement. Compliance problems include diet.  Hypertensive end-organ damage includes PVD.     Review of Systems  Constitutional: Negative for fatigue and unexpected  weight change.  HENT: Negative for dental problem, mouth sores and trouble swallowing.   Eyes: Negative for visual disturbance.  Respiratory: Negative for cough, choking, chest tightness, shortness of breath and wheezing.   Cardiovascular: Negative for chest pain, palpitations and leg swelling.  Gastrointestinal: Negative for nausea, vomiting, abdominal pain, diarrhea, constipation and abdominal distention.  Endocrine: Positive for polydipsia and polyuria. Negative for polyphagia.  Genitourinary: Negative for dysuria, urgency, hematuria and flank pain.  Musculoskeletal: Negative for myalgias, back pain, gait problem and neck pain.  Skin: Negative for pallor, rash and wound.  Neurological: Negative for seizures, syncope, weakness, numbness and headaches.  Psychiatric/Behavioral: Negative.  Negative for confusion and dysphoric mood.    Objective:    BP 144/82 mmHg  Pulse 101  Ht 6' (1.829 m)  Wt 415 lb (188.243 kg)  BMI 56.27 kg/m2  SpO2 98%  Wt Readings from Last 3 Encounters:  01/15/15 415 lb (188.243 kg)  12/16/14 408 lb (185.068 kg)  10/14/14 411 lb 3.2 oz (186.519 kg)    Physical Exam  Constitutional: He is oriented to person, place, and time. He appears well-developed and well-nourished. He is cooperative. No distress.  HENT:  Head: Normocephalic and atraumatic.  Eyes:  EOM are normal.  Neck: Normal range of motion. Neck supple. No tracheal deviation present. No thyromegaly present.  Cardiovascular: Normal rate, S1 normal, S2 normal and normal heart sounds.  Exam reveals no gallop.   No murmur heard. Pulses:      Dorsalis pedis pulses are 0 on the right side, and 0 on the left side.       Posterior tibial pulses are 0 on the right side, and 0 on the left side.  Pulmonary/Chest: Breath sounds normal. No respiratory distress. He has no wheezes.  Abdominal: Soft. Bowel sounds are normal. He exhibits no distension. There is no tenderness. There is no guarding and no CVA  tenderness.  Musculoskeletal: He exhibits no edema.       Right shoulder: He exhibits no swelling and no deformity.  Neurological: He is alert and oriented to person, place, and time. He has normal strength and normal reflexes. No cranial nerve deficit or sensory deficit. Gait normal.  Skin: Skin is warm and dry. No rash noted. No cyanosis. Nails show no clubbing.  Psychiatric: He has a normal mood and affect. His speech is normal and behavior is normal. Judgment and thought content normal. Cognition and memory are normal.    Results for orders placed or performed in visit on 12/16/14  Hemoglobin A1c  Result Value Ref Range   Hgb A1c MFr Bld 9.1 (A) 4.0 - 6.0 %   Complete Blood Count (Most recent): Lab Results  Component Value Date   WBC 9.3 04/12/2013   HGB 13.0 04/12/2013   HCT 39.2 04/12/2013   MCV 91.4 04/12/2013   PLT 287 04/12/2013   Chemistry (most recent): Lab Results  Component Value Date   NA 141 04/12/2013   K 4.6 04/12/2013   CL 104 04/12/2013   CO2 27 04/12/2013   BUN 10 04/12/2013   CREATININE 0.71 04/12/2013   Diabetic Labs (most recent): Lab Results  Component Value Date   HGBA1C 9.1* 12/09/2014   HGBA1C 7.9* 04/06/2013     Assessment & Plan:   1. Uncontrolled type 2 diabetes mellitus with diabetic peripheral angiopathy without gangrene, with long-term current use of insulin (HCC) - patient remains at a high risk for more acute and chronic complications of diabetes which include CAD, CVA, CKD, retinopathy, and neuropathy. These are all discussed in detail with the patient.  Patient came with persistently above target glucose profile, and  recent A1c of 9.1 %.  Glucose logs and insulin administration records pertaining to this visit,  to be scanned into patient's records.  Recent labs reviewed.   - I have re-counseled the patient on diet management and weight loss  by adopting a carbohydrate restricted / protein rich  Diet.  - Suggestion is made for  patient to avoid simple carbohydrates   from their diet including Cakes , Desserts, Ice Cream,  Soda (  diet and regular) , Sweet Tea , Candies,  Chips, Cookies, Artificial Sweeteners,   and "Sugar-free" Products .  This will help patient to have stable blood glucose profile and potentially avoid unintended  Weight gain.  - Patient is advised to stick to a routine mealtimes to eat 3 meals  a day and avoid unnecessary snacks ( to snack only to correct hypoglycemia).  - The patient  has been  scheduled with Norm Salt, RDN, CDE for individualized DM education.  - I have approached patient with the following individualized plan to manage diabetes and patient agrees. -Continue Lantus 80 units qhs,  increase  Novolog to 22 units TIDAC plus correction dose , with strict monitoring of BG AC and HS .   -Patient is encouraged to call clinic for blood glucose levels less than 70 or above 300 mg /dl. -I will continue Metformin 1gm PO BID and Victoza 1.8 mg qday. -He is following with Norm SaltPenny Crumpton, CDE for DM education. - He did not tolerate  Invokana , was d/ced.  - Patient specific target  for A1c; LDL, HDL, Triglycerides, and  Waist Circumference were discussed in detail.  2) BP/HTN: Uncontrolled. Continue current medications including ACEI/ARB. 3) Lipids/HPL:  continue statins. 4)  Weight/Diet: CDE consult in progress, exercise, and carbohydrates information provided.  5) Chronic Care/Health Maintenance:  -Patient  Is  on ACEI/ARB and Statin medications and encouraged to continue to follow up with Ophthalmology, Podiatrist at least yearly or according to recommendations, and advised to  stay away from smoking. I have recommended yearly flu vaccine and pneumonia vaccination at least every 5 years; moderate intensity exercise for up to 150 minutes weekly; and  sleep for at least 7 hours a day.  - 25 minutes of time was spent on the care of this patient , 50% of which was applied for counseling on  diabetes complications and their preventions.  - I advised patient to maintain close follow up with HAWKINS,EDWARD L, MD for primary care needs.  Patient is asked to bring meter and  blood glucose logs during their next visit.   Follow up plan: -Return in about 9 weeks (around 03/19/2015) for diabetes, high blood pressure, high cholesterol, follow up with pre-visit labs, meter, and logs.  Marquis LunchGebre Kaiyla Stahly, MD Phone: [phone]  Fax: [phone]   01/15/2015, 4:30 PM

## 2015-01-16 ENCOUNTER — Telehealth: Payer: Self-pay | Admitting: "Endocrinology

## 2015-01-16 ENCOUNTER — Other Ambulatory Visit: Payer: Self-pay | Admitting: "Endocrinology

## 2015-01-16 MED ORDER — ALBIGLUTIDE 30 MG ~~LOC~~ PEN: 30.0000 mg | PEN_INJECTOR | SUBCUTANEOUS | Status: DC

## 2015-01-16 NOTE — Telephone Encounter (Signed)
Pt notified and agrees. 

## 2015-01-16 NOTE — Telephone Encounter (Signed)
Pt needs a new prescription to replace Victoza.

## 2015-01-16 NOTE — Telephone Encounter (Signed)
Medicaid won't pay for his victoza, please call him back and prescribe him something different. thank you

## 2015-01-16 NOTE — Telephone Encounter (Signed)
I will send a prescription for Tanzeum 30 mg weekly. I advised him to ask pharmacist how to use.

## 2015-02-27 ENCOUNTER — Other Ambulatory Visit: Payer: Self-pay | Admitting: "Endocrinology

## 2015-03-04 ENCOUNTER — Other Ambulatory Visit: Payer: Self-pay | Admitting: "Endocrinology

## 2015-03-12 ENCOUNTER — Other Ambulatory Visit: Payer: Self-pay | Admitting: "Endocrinology

## 2015-03-12 LAB — LIPID PANEL
CHOL/HDL RATIO: 4.9 ratio (ref <=5.0)
Cholesterol: 172 mg/dL (ref 125–200)
HDL: 35 mg/dL — ABNORMAL LOW (ref >=40)
LDL CALC: 106 mg/dL (ref <130)
Triglycerides: 155 mg/dL — ABNORMAL HIGH (ref <150)
VLDL: 31 mg/dL — ABNORMAL HIGH (ref <30)

## 2015-03-12 LAB — BASIC METABOLIC PANEL
BUN: 13 mg/dL (ref 7–25)
CHLORIDE: 103 mmol/L (ref 98–110)
CO2: 23 mmol/L (ref 20–31)
Calcium: 9.2 mg/dL (ref 8.6–10.3)
Creat: 0.73 mg/dL (ref 0.70–1.33)
Glucose, Bld: 101 mg/dL — ABNORMAL HIGH (ref 65–99)
Potassium: 4.4 mmol/L (ref 3.5–5.3)
SODIUM: 139 mmol/L (ref 135–146)

## 2015-03-12 LAB — T4, FREE: Free T4: 1.1 ng/dL (ref 0.8–1.8)

## 2015-03-12 LAB — TSH: TSH: 2.59 m[IU]/L (ref 0.40–4.50)

## 2015-03-13 LAB — HEMOGLOBIN A1C
Hgb A1c MFr Bld: 8.4 % — ABNORMAL HIGH (ref <5.7)
MEAN PLASMA GLUCOSE: 194 mg/dL — AB (ref <117)

## 2015-03-13 LAB — MICROALBUMIN / CREATININE URINE RATIO
Creatinine, Urine: 296 mg/dL (ref 20–370)
MICROALB/CREAT RATIO: 5 ug/mg{creat} (ref <30)
Microalb, Ur: 1.4 mg/dL

## 2015-03-20 ENCOUNTER — Ambulatory Visit (INDEPENDENT_AMBULATORY_CARE_PROVIDER_SITE_OTHER): Payer: Medicaid Other | Admitting: "Endocrinology

## 2015-03-20 ENCOUNTER — Encounter: Payer: Self-pay | Admitting: "Endocrinology

## 2015-03-20 VITALS — BP 162/83 | HR 82 | Ht 72.0 in | Wt >= 6400 oz

## 2015-03-20 DIAGNOSIS — Z794 Long term (current) use of insulin: Secondary | ICD-10-CM

## 2015-03-20 DIAGNOSIS — E1165 Type 2 diabetes mellitus with hyperglycemia: Secondary | ICD-10-CM | POA: Diagnosis not present

## 2015-03-20 DIAGNOSIS — E1151 Type 2 diabetes mellitus with diabetic peripheral angiopathy without gangrene: Secondary | ICD-10-CM | POA: Diagnosis not present

## 2015-03-20 DIAGNOSIS — E785 Hyperlipidemia, unspecified: Secondary | ICD-10-CM

## 2015-03-20 DIAGNOSIS — I10 Essential (primary) hypertension: Secondary | ICD-10-CM | POA: Diagnosis not present

## 2015-03-20 DIAGNOSIS — IMO0002 Reserved for concepts with insufficient information to code with codable children: Secondary | ICD-10-CM

## 2015-03-20 MED ORDER — INSULIN ASPART 100 UNIT/ML FLEXPEN: 20.0000 [IU] | PEN_INJECTOR | Freq: Three times a day (TID) | SUBCUTANEOUS | Status: DC

## 2015-03-20 MED ORDER — ALBIGLUTIDE 50 MG ~~LOC~~ PEN: 50.0000 mg | PEN_INJECTOR | SUBCUTANEOUS | Status: DC

## 2015-03-20 NOTE — Patient Instructions (Signed)

## 2015-03-20 NOTE — Progress Notes (Signed)
Subjective:    Patient ID: Aaron Potter, male    DOB: 1960-10-19, PCP Fredirick Maudlin, MD   Past Medical History  Diagnosis Date  . Diabetes mellitus without complication (HCC)   . Asthma   . COPD (chronic obstructive pulmonary disease) (HCC)   . Hypertension   . Gout   . Sleep apnea   . Balanitis   . Cellulitis   . Lichen sclerosus   . Obesity, morbid (more than 100 lbs over ideal weight or BMI > 40) (HCC)   . Urticaria    Past Surgical History  Procedure Laterality Date  . Cholecystectomy     Social History   Social History  . Marital Status: Single    Spouse Name: N/A  . Number of Children: N/A  . Years of Education: N/A   Social History Main Topics  . Smoking status: Never Smoker   . Smokeless tobacco: Never Used  . Alcohol Use: No  . Drug Use: No  . Sexual Activity: Yes   Other Topics Concern  . None   Social History Narrative   Outpatient Encounter Prescriptions as of 03/20/2015  Medication Sig  . ACCU-CHEK AVIVA PLUS test strip USE TO TEST BLOOD SUGAR 4 TIMES DAILY.  Marland Kitchen albuterol (PROAIR HFA) 108 (90 BASE) MCG/ACT inhaler Inhale 2 puffs into the lungs every 4 (four) hours as needed for wheezing or shortness of breath. Sometimes only uses one puff  . albuterol (PROVENTIL HFA;VENTOLIN HFA) 108 (90 BASE) MCG/ACT inhaler Inhale 2 puffs into the lungs 2 (two) times daily as needed for wheezing or shortness of breath.  . allopurinol (ZYLOPRIM) 300 MG tablet Take 300 mg by mouth daily.    Marland Kitchen ALPRAZolam (XANAX) 1 MG tablet Take 1 mg by mouth 4 (four) times daily.    . benazepril (LOTENSIN) 5 MG tablet TAKE (1) TABLET BY MOUTH ONCE DAILY.  . diphenhydrAMINE (BENADRYL) 25 mg capsule Take 50 mg by mouth every 6 (six) hours as needed for itching.  Marland Kitchen EASY TOUCH PEN NEEDLES 31G X 8 MM MISC USE 5 TIMES DAILY.  Marland Kitchen HYDROcodone-acetaminophen (NORCO) 10-325 MG per tablet Take 1 tablet by mouth 4 (four) times daily.  . indomethacin (INDOCIN) 50 MG capsule Take 50 mg by  mouth daily.   . insulin aspart (NOVOLOG FLEXPEN) 100 UNIT/ML FlexPen Inject 20-26 Units into the skin 3 (three) times daily with meals.  . Insulin Glargine (LANTUS SOLOSTAR) 100 UNIT/ML Solostar Pen Inject 80 Units into the skin at bedtime.  . metFORMIN (GLUCOPHAGE) 500 MG tablet Take 1,000 mg by mouth 2 (two) times daily with a meal.   . Multiple Vitamin (MULTIVITAMIN WITH MINERALS) TABS tablet Take 1 tablet by mouth daily.  . pravastatin (PRAVACHOL) 40 MG tablet Take 40 mg by mouth daily.  Marland Kitchen tiotropium (SPIRIVA) 18 MCG inhalation capsule Place 18 mcg into inhaler and inhale daily.  . verapamil (CALAN-SR) 240 MG CR tablet Take 240 mg by mouth daily.  . Vitamin D, Ergocalciferol, (DRISDOL) 50000 units CAPS capsule TAKE 1 CAPSULE BY MOUTH ONCE A WEEK.  Marland Kitchen zolpidem (AMBIEN) 10 MG tablet Take 10 mg by mouth at bedtime as needed for sleep.   . [DISCONTINUED] Albiglutide (TANZEUM) 30 MG PEN Inject 30 mg into the skin once a week.  . [DISCONTINUED] NOVOLOG FLEXPEN 100 UNIT/ML FlexPen INJECT 22-28 UNITS SUB-Q 3 TIMES A DAY WITH MEALS.  Marland Kitchen Albiglutide (TANZEUM) 50 MG PEN Inject 50 mg into the skin once a week.  . [DISCONTINUED] clindamycin (CLEOCIN)  300 MG capsule Take 1 capsule (300 mg total) by mouth 4 (four) times daily. X 7 days (Patient not taking: Reported on 12/16/2014)   No facility-administered encounter medications on file as of 03/20/2015.   ALLERGIES: Allergies  Allergen Reactions  . Biaxin [Clarithromycin] Anaphylaxis and Shortness Of Breath  . Peanut-Containing Drug Products Anaphylaxis   VACCINATION STATUS:  There is no immunization history on file for this patient.  Diabetes He presents for his follow-up diabetic visit. He has type 2 diabetes mellitus. Onset time: He was diagnosed at approximate age of 67 years. His disease course has been improving. There are no hypoglycemic associated symptoms. Pertinent negatives for hypoglycemia include no confusion, headaches, pallor or  seizures. Associated symptoms include polydipsia and polyuria. Pertinent negatives for diabetes include no chest pain, no fatigue, no polyphagia and no weakness. There are no hypoglycemic complications. Symptoms are improving. Diabetic complications include PVD. Risk factors for coronary artery disease include dyslipidemia, diabetes mellitus, family history, male sex, obesity and sedentary lifestyle. Current diabetic treatment includes intensive insulin program. He is compliant with treatment most of the time. His weight is stable. He is following a generally unhealthy diet. He has had a previous visit with a dietitian. He never participates in exercise. His home blood glucose trend is increasing steadily. His breakfast blood glucose range is generally 180-200 mg/dl. His lunch blood glucose range is generally 180-200 mg/dl. His dinner blood glucose range is generally 180-200 mg/dl. His overall blood glucose range is 180-200 mg/dl. An ACE inhibitor/angiotensin II receptor blocker is being taken.  Hyperlipidemia This is a chronic problem. The current episode started more than 1 year ago. The problem is uncontrolled. Exacerbating diseases include diabetes and obesity. Pertinent negatives include no chest pain, myalgias or shortness of breath. Current antihyperlipidemic treatment includes statins. Risk factors for coronary artery disease include diabetes mellitus, dyslipidemia, hypertension, male sex, obesity and a sedentary lifestyle.  Hypertension This is a chronic problem. The current episode started more than 1 year ago. The problem is uncontrolled. Pertinent negatives include no chest pain, headaches, neck pain, palpitations or shortness of breath. Past treatments include ACE inhibitors and calcium channel blockers. The current treatment provides no improvement. Compliance problems include diet.  Hypertensive end-organ damage includes PVD.     Review of Systems  Constitutional: Negative for fatigue and  unexpected weight change.  HENT: Negative for dental problem, mouth sores and trouble swallowing.   Eyes: Negative for visual disturbance.  Respiratory: Negative for cough, choking, chest tightness, shortness of breath and wheezing.   Cardiovascular: Negative for chest pain, palpitations and leg swelling.  Gastrointestinal: Negative for nausea, vomiting, abdominal pain, diarrhea, constipation and abdominal distention.  Endocrine: Positive for polydipsia and polyuria. Negative for polyphagia.  Genitourinary: Negative for dysuria, urgency, hematuria and flank pain.  Musculoskeletal: Negative for myalgias, back pain, gait problem and neck pain.  Skin: Negative for pallor, rash and wound.  Neurological: Negative for seizures, syncope, weakness, numbness and headaches.  Psychiatric/Behavioral: Negative.  Negative for confusion and dysphoric mood.    Objective:    BP 162/83 mmHg  Pulse 82  Ht 6' (1.829 m)  Wt 415 lb (188.243 kg)  BMI 56.27 kg/m2  SpO2 98%  Wt Readings from Last 3 Encounters:  03/20/15 415 lb (188.243 kg)  01/15/15 415 lb (188.243 kg)  12/16/14 408 lb (185.068 kg)    Physical Exam  Constitutional: He is oriented to person, place, and time. He appears well-developed and well-nourished. He is cooperative. No distress.  HENT:  Head: Normocephalic and atraumatic.  Eyes: EOM are normal.  Neck: Normal range of motion. Neck supple. No tracheal deviation present. No thyromegaly present.  Cardiovascular: Normal rate, S1 normal, S2 normal and normal heart sounds.  Exam reveals no gallop.   No murmur heard. Pulses:      Dorsalis pedis pulses are 0 on the right side, and 0 on the left side.       Posterior tibial pulses are 0 on the right side, and 0 on the left side.  Pulmonary/Chest: Breath sounds normal. No respiratory distress. He has no wheezes.  Abdominal: Soft. Bowel sounds are normal. He exhibits no distension. There is no tenderness. There is no guarding and no CVA  tenderness.  Musculoskeletal: He exhibits no edema.       Right shoulder: He exhibits no swelling and no deformity.  Neurological: He is alert and oriented to person, place, and time. He has normal strength and normal reflexes. No cranial nerve deficit or sensory deficit. Gait normal.  Skin: Skin is warm and dry. No rash noted. No cyanosis. Nails show no clubbing.  Psychiatric: He has a normal mood and affect. His speech is normal and behavior is normal. Judgment and thought content normal. Cognition and memory are normal.    Results for orders placed or performed in visit on 03/12/15  Microalbumin / creatinine urine ratio  Result Value Ref Range   Creatinine, Urine 296 20 - 370 mg/dL   Microalb, Ur 1.4 Not estab mg/dL   Microalb Creat Ratio 5 <30 mcg/mg creat  Basic metabolic panel  Result Value Ref Range   Sodium 139 135 - 146 mmol/L   Potassium 4.4 3.5 - 5.3 mmol/L   Chloride 103 98 - 110 mmol/L   CO2 23 20 - 31 mmol/L   Glucose, Bld 101 (H) 65 - 99 mg/dL   BUN 13 7 - 25 mg/dL   Creat 5.360.73 6.440.70 - 0.341.33 mg/dL   Calcium 9.2 8.6 - 74.210.3 mg/dL  Lipid panel  Result Value Ref Range   Cholesterol 172 125 - 200 mg/dL   Triglycerides 595155 (H) <150 mg/dL   HDL 35 (L) >=63>=40 mg/dL   Total CHOL/HDL Ratio 4.9 <=5.0 Ratio   VLDL 31 (H) <30 mg/dL   LDL Cholesterol 875106 <643<130 mg/dL  TSH  Result Value Ref Range   TSH 2.59 0.40 - 4.50 mIU/L  T4, free  Result Value Ref Range   Free T4 1.1 0.8 - 1.8 ng/dL  Hemoglobin P2RA1c  Result Value Ref Range   Hgb A1c MFr Bld 8.4 (H) <5.7 %   Mean Plasma Glucose 194 (H) <117 mg/dL   Diabetic Labs (most recent): Lab Results  Component Value Date   HGBA1C 8.4* 03/12/2015   HGBA1C 9.1* 12/09/2014   HGBA1C 7.9* 04/06/2013   Lipid Panel     Component Value Date/Time   CHOL 172 03/12/2015 1200   TRIG 155* 03/12/2015 1200   HDL 35* 03/12/2015 1200   CHOLHDL 4.9 03/12/2015 1200   VLDL 31* 03/12/2015 1200   LDLCALC 106 03/12/2015 1200     Assessment &  Plan:   1. Uncontrolled type 2 diabetes mellitus with diabetic peripheral angiopathy without gangrene, with long-term current use of insulin (HCC) - patient remains at a high risk for more acute and chronic complications of diabetes which include CAD, CVA, CKD, retinopathy, and neuropathy. These are all discussed in detail with the patient.  Patient came with better than before but still above target glucose profile,  and  recent A1c  8.4% improving from  9.1 %.  Glucose logs and insulin administration records pertaining to this visit,  to be scanned into patient's records.  Recent labs reviewed.   - I have re-counseled the patient on diet management and weight loss  by adopting a carbohydrate restricted / protein rich  Diet.  - Suggestion is made for patient to avoid simple carbohydrates   from their diet including Cakes , Desserts, Ice Cream,  Soda (  diet and regular) , Sweet Tea , Candies,  Chips, Cookies, Artificial Sweeteners,   and "Sugar-free" Products .  This will help patient to have stable blood glucose profile and potentially avoid unintended  Weight gain.  - Patient is advised to stick to a routine mealtimes to eat 3 meals  a day and avoid unnecessary snacks ( to snack only to correct hypoglycemia).  - The patient  has been  scheduled with Norm Salt, RDN, CDE for individualized DM education.  - I have approached patient with the following individualized plan to manage diabetes and patient agrees. -Continue Lantus 80 units qhs,   Novolog 20 units TIDAC plus correction dose , with strict monitoring of BG AC and HS .   -Patient is encouraged to call clinic for blood glucose levels less than 70 or above 300 mg /dl. -I will continue Metformin 1gm PO BID and  increase Tanzeum to 50 milligrams weekly.  -He is following with Norm Salt, CDE for DM education. - He did not tolerate  Invokana , was d/ced.  - Patient specific target  for A1c; LDL, HDL, Triglycerides, and  Waist  Circumference were discussed in detail.  2) BP/HTN: Uncontrolled. Continue current medications including ACEI/ARB. 3) Lipids/HPL:  continue statins. 4)  Weight/Diet: CDE consult in progress, exercise, and carbohydrates information provided.  5) Chronic Care/Health Maintenance:  -Patient  Is  on ACEI/ARB and Statin medications and encouraged to continue to follow up with Ophthalmology, Podiatrist at least yearly or according to recommendations, and advised to  stay away from smoking. I have recommended yearly flu vaccine and pneumonia vaccination at least every 5 years; moderate intensity exercise for up to 150 minutes weekly; and  sleep for at least 7 hours a day.  - 25 minutes of time was spent on the care of this patient , 50% of which was applied for counseling on diabetes complications and their preventions.  - I advised patient to maintain close follow up with HAWKINS,EDWARD L, MD for primary care needs.  Patient is asked to bring meter and  blood glucose logs during their next visit.   Follow up plan: -Return in about 3 months (around 06/20/2015) for diabetes, high blood pressure, high cholesterol, follow up with pre-visit labs, meter, and logs.  Marquis Lunch, MD Phone: [phone]  Fax: [phone]   03/20/2015, 9:55 AM

## 2015-04-04 ENCOUNTER — Other Ambulatory Visit: Payer: Self-pay | Admitting: "Endocrinology

## 2015-04-27 ENCOUNTER — Other Ambulatory Visit: Payer: Self-pay | Admitting: "Endocrinology

## 2015-04-28 ENCOUNTER — Other Ambulatory Visit: Payer: Self-pay | Admitting: "Endocrinology

## 2015-06-19 ENCOUNTER — Other Ambulatory Visit: Payer: Self-pay | Admitting: "Endocrinology

## 2015-06-19 LAB — BASIC METABOLIC PANEL
BUN: 16 mg/dL (ref 7–25)
CALCIUM: 9.5 mg/dL (ref 8.6–10.3)
CO2: 27 mmol/L (ref 20–31)
CREATININE: 0.75 mg/dL (ref 0.70–1.33)
Chloride: 103 mmol/L (ref 98–110)
Glucose, Bld: 251 mg/dL — ABNORMAL HIGH (ref 65–99)
Potassium: 5.5 mmol/L — ABNORMAL HIGH (ref 3.5–5.3)
Sodium: 140 mmol/L (ref 135–146)

## 2015-06-20 LAB — HEMOGLOBIN A1C
Hgb A1c MFr Bld: 8.9 % — ABNORMAL HIGH (ref <5.7)
MEAN PLASMA GLUCOSE: 209 mg/dL

## 2015-06-26 ENCOUNTER — Ambulatory Visit: Payer: Medicaid Other | Admitting: "Endocrinology

## 2015-06-29 ENCOUNTER — Other Ambulatory Visit: Payer: Self-pay | Admitting: "Endocrinology

## 2015-06-29 ENCOUNTER — Encounter: Payer: Self-pay | Admitting: "Endocrinology

## 2015-06-29 ENCOUNTER — Ambulatory Visit (INDEPENDENT_AMBULATORY_CARE_PROVIDER_SITE_OTHER): Payer: Medicaid Other | Admitting: "Endocrinology

## 2015-06-29 VITALS — BP 151/85 | HR 68 | Ht 72.0 in | Wt >= 6400 oz

## 2015-06-29 DIAGNOSIS — E1165 Type 2 diabetes mellitus with hyperglycemia: Secondary | ICD-10-CM

## 2015-06-29 DIAGNOSIS — E1151 Type 2 diabetes mellitus with diabetic peripheral angiopathy without gangrene: Secondary | ICD-10-CM | POA: Diagnosis not present

## 2015-06-29 DIAGNOSIS — Z794 Long term (current) use of insulin: Secondary | ICD-10-CM | POA: Diagnosis not present

## 2015-06-29 DIAGNOSIS — I10 Essential (primary) hypertension: Secondary | ICD-10-CM | POA: Diagnosis not present

## 2015-06-29 DIAGNOSIS — IMO0002 Reserved for concepts with insufficient information to code with codable children: Secondary | ICD-10-CM

## 2015-06-29 DIAGNOSIS — E785 Hyperlipidemia, unspecified: Secondary | ICD-10-CM

## 2015-06-29 MED ORDER — INSULIN ASPART 100 UNIT/ML FLEXPEN: 22.0000 [IU] | PEN_INJECTOR | Freq: Every day | SUBCUTANEOUS | Status: DC

## 2015-06-29 NOTE — Patient Instructions (Signed)

## 2015-06-29 NOTE — Progress Notes (Signed)
Subjective:    Patient ID: Aaron Potter, male    DOB: [birth year], PCP Aaron Maudlin, Aaron Potter   Past Medical History  Diagnosis Date  . Diabetes mellitus without complication (HCC)   . Asthma   . COPD (chronic obstructive pulmonary disease) (HCC)   . Hypertension   . Gout   . Sleep apnea   . Balanitis   . Cellulitis   . Lichen sclerosus   . Obesity, morbid (more than 100 lbs over ideal weight or BMI > 40) (HCC)   . Urticaria    Past Surgical History  Procedure Laterality Date  . Cholecystectomy     Social History   Social History  . Marital Status: Single    Spouse Name: N/A  . Number of Children: N/A  . Years of Education: N/A   Social History Main Topics  . Smoking status: Never Smoker   . Smokeless tobacco: Never Used  . Alcohol Use: No  . Drug Use: No  . Sexual Activity: Yes   Other Topics Concern  . None   Social History Narrative   Outpatient Encounter Prescriptions as of 06/29/2015  Medication Sig  . insulin aspart (NOVOLOG FLEXPEN) 100 UNIT/ML FlexPen Inject 22-28 Units into the skin at bedtime.  . [DISCONTINUED] Insulin Aspart (NOVOLOG FLEXPEN [place]) Inject 22-28 Units into the skin at bedtime.  Marland Kitchen ACCU-CHEK AVIVA PLUS test strip USE TO TEST BLOOD SUGAR 4 TIMES DAILY.  Marland Kitchen albuterol (PROAIR HFA) 108 (90 BASE) MCG/ACT inhaler Inhale 2 puffs into the lungs every 4 (four) hours as needed for wheezing or shortness of breath. Sometimes only uses one puff  . albuterol (PROVENTIL HFA;VENTOLIN HFA) 108 (90 BASE) MCG/ACT inhaler Inhale 2 puffs into the lungs 2 (two) times daily as needed for wheezing or shortness of breath.  . allopurinol (ZYLOPRIM) 300 MG tablet Take 300 mg by mouth daily.    Marland Kitchen ALPRAZolam (XANAX) 1 MG tablet Take 1 mg by mouth 4 (four) times daily.    . benazepril (LOTENSIN) 5 MG tablet TAKE (1) TABLET BY MOUTH ONCE DAILY.  . benazepril (LOTENSIN) 5 MG tablet TAKE 1 TABLET ONCE DAILY.  . diphenhydrAMINE (BENADRYL) 25 mg capsule Take 50 mg by mouth  every 6 (six) hours as needed for itching.  Marland Kitchen EASY TOUCH PEN NEEDLES 31G X 8 MM MISC USE 5 TIMES DAILY.  Marland Kitchen HYDROcodone-acetaminophen (NORCO) 10-325 MG per tablet Take 1 tablet by mouth 4 (four) times daily.  . indomethacin (INDOCIN) 50 MG capsule Take 50 mg by mouth daily.   . Insulin Glargine (LANTUS SOLOSTAR) 100 UNIT/ML Solostar Pen Inject 80 Units into the skin at bedtime.  . metFORMIN (GLUCOPHAGE) 500 MG tablet Take 1,000 mg by mouth 2 (two) times daily with a meal.   . Multiple Vitamin (MULTIVITAMIN WITH MINERALS) TABS tablet Take 1 tablet by mouth daily.  . pravastatin (PRAVACHOL) 40 MG tablet Take 40 mg by mouth daily.  Marland Kitchen tiotropium (SPIRIVA) 18 MCG inhalation capsule Place 18 mcg into inhaler and inhale daily.  . verapamil (CALAN-SR) 240 MG CR tablet Take 240 mg by mouth daily.  Marland Kitchen zolpidem (AMBIEN) 10 MG tablet Take 10 mg by mouth at bedtime as needed for sleep.   . [DISCONTINUED] Albiglutide (TANZEUM) 50 MG PEN Inject 50 mg into the skin once a week.  . [DISCONTINUED] insulin aspart (NOVOLOG FLEXPEN) 100 UNIT/ML FlexPen Inject 20-26 Units into the skin 3 (three) times daily with meals.  . [DISCONTINUED] NOVOLOG FLEXPEN 100 UNIT/ML FlexPen INJECT 22-28 UNITS  SUB-Q 3 TIMES A DAY WITH MEALS.  . [DISCONTINUED] Vitamin D, Ergocalciferol, (DRISDOL) 50000 units CAPS capsule TAKE 1 CAPSULE WEEKLY.   No facility-administered encounter medications on file as of 06/29/2015.   ALLERGIES: Allergies  Allergen Reactions  . Biaxin [Clarithromycin] Anaphylaxis and Shortness Of Breath  . Peanut-Containing Drug Products Anaphylaxis   VACCINATION STATUS:  There is no immunization history on file for this patient.  Diabetes He presents for his follow-up diabetic visit. He has type 2 diabetes mellitus. Onset time: He was diagnosed at approximate age of 85 years. His disease course has been worsening. There are no hypoglycemic associated symptoms. Pertinent negatives for hypoglycemia include no  confusion, headaches, pallor or seizures. Associated symptoms include polydipsia and polyuria. Pertinent negatives for diabetes include no chest pain, no fatigue, no polyphagia and no weakness. There are no hypoglycemic complications. Symptoms are worsening. Diabetic complications include PVD. Risk factors for coronary artery disease include dyslipidemia, diabetes mellitus, family history, male sex, obesity and sedentary lifestyle. Current diabetic treatment includes intensive insulin program. He is compliant with treatment most of the time. His weight is stable. He is following a generally unhealthy diet. He has had a previous visit with a dietitian. He never participates in exercise. His home blood glucose trend is increasing steadily. His breakfast blood glucose range is generally 180-200 mg/dl. His lunch blood glucose range is generally 180-200 mg/dl. His dinner blood glucose range is generally 180-200 mg/dl. His overall blood glucose range is 180-200 mg/dl. An ACE inhibitor/angiotensin II receptor blocker is being taken.  Hyperlipidemia This is a chronic problem. The current episode started more than 1 year ago. The problem is uncontrolled. Exacerbating diseases include diabetes and obesity. Pertinent negatives include no chest pain, myalgias or shortness of breath. Current antihyperlipidemic treatment includes statins. Risk factors for coronary artery disease include diabetes mellitus, dyslipidemia, hypertension, male sex, obesity and a sedentary lifestyle.  Hypertension This is a chronic problem. The current episode started more than 1 year ago. The problem is uncontrolled. Pertinent negatives include no chest pain, headaches, neck pain, palpitations or shortness of breath. Past treatments include ACE inhibitors and calcium channel blockers. The current treatment provides no improvement. Compliance problems include diet.  Hypertensive end-organ damage includes PVD.     Review of Systems   Constitutional: Negative for fatigue and unexpected weight change.  HENT: Negative for dental problem, mouth sores and trouble swallowing.   Eyes: Negative for visual disturbance.  Respiratory: Negative for cough, choking, chest tightness, shortness of breath and wheezing.   Cardiovascular: Negative for chest pain, palpitations and leg swelling.  Gastrointestinal: Negative for nausea, vomiting, abdominal pain, diarrhea, constipation and abdominal distention.  Endocrine: Positive for polydipsia and polyuria. Negative for polyphagia.  Genitourinary: Negative for dysuria, urgency, hematuria and flank pain.  Musculoskeletal: Negative for myalgias, back pain, gait problem and neck pain.  Skin: Negative for pallor, rash and wound.  Neurological: Negative for seizures, syncope, weakness, numbness and headaches.  Psychiatric/Behavioral: Negative.  Negative for confusion and dysphoric mood.    Objective:    BP 151/85 mmHg  Pulse 68  Ht 6' (1.829 m)  Wt 415 lb (188.243 kg)  BMI 56.27 kg/m2  SpO2 97%  Wt Readings from Last 3 Encounters:  06/29/15 415 lb (188.243 kg)  03/20/15 415 lb (188.243 kg)  01/15/15 415 lb (188.243 kg)    Physical Exam  Constitutional: He is oriented to person, place, and time. He appears well-developed and well-nourished. He is cooperative. No distress.  HENT:  Head:  Normocephalic and atraumatic.  Eyes: EOM are normal.  Neck: Normal range of motion. Neck supple. No tracheal deviation present. No thyromegaly present.  Cardiovascular: Normal rate, S1 normal, S2 normal and normal heart sounds.  Exam reveals no gallop.   No murmur heard. Pulses:      Dorsalis pedis pulses are 0 on the right side, and 0 on the left side.       Posterior tibial pulses are 0 on the right side, and 0 on the left side.  Pulmonary/Chest: Breath sounds normal. No respiratory distress. He has no wheezes.  Abdominal: Soft. Bowel sounds are normal. He exhibits no distension. There is no  tenderness. There is no guarding and no CVA tenderness.  Musculoskeletal: He exhibits no edema.       Right shoulder: He exhibits no swelling and no deformity.  Neurological: He is alert and oriented to person, place, and time. He has normal strength and normal reflexes. No cranial nerve deficit or sensory deficit. Gait normal.  Skin: Skin is warm and dry. No rash noted. No cyanosis. Nails show no clubbing.  Psychiatric: He has a normal mood and affect. His speech is normal and behavior is normal. Judgment and thought content normal. Cognition and memory are normal.    Results for orders placed or performed in visit on 06/19/15  Basic metabolic panel  Result Value Ref Range   Sodium 140 135 - 146 mmol/Potter   Potassium 5.5 (H) 3.5 - 5.3 mmol/Potter   Chloride 103 98 - 110 mmol/Potter   CO2 27 20 - 31 mmol/Potter   Glucose, Bld 251 (H) 65 - 99 mg/dL   BUN 16 7 - 25 mg/dL   Creat 9.140.75 7.820.70 - 9.561.33 mg/dL   Calcium 9.5 8.6 - 21.310.3 mg/dL  Hemoglobin Y8MA1c  Result Value Ref Range   Hgb A1c MFr Bld 8.9 (H) <5.7 %   Mean Plasma Glucose 209 mg/dL   Diabetic Labs (most recent): Lab Results  Component Value Date   HGBA1C 8.9* 06/19/2015   HGBA1C 8.4* 03/12/2015   HGBA1C 9.1* 12/09/2014   Lipid Panel     Component Value Date/Time   CHOL 172 03/12/2015 1200   TRIG 155* 03/12/2015 1200   HDL 35* 03/12/2015 1200   CHOLHDL 4.9 03/12/2015 1200   VLDL 31* 03/12/2015 1200   LDLCALC 106 03/12/2015 1200     Assessment & Plan:   1. Uncontrolled type 2 diabetes mellitus with diabetic peripheral angiopathy without gangrene, with long-term current use of insulin (HCC) - patient remains at a high risk for more acute and chronic complications of diabetes which include CAD, CVA, CKD, retinopathy, and neuropathy. These are all discussed in detail with the patient.  Patient came with better than before but still above target glucose profile, and  recent A1c  8.9% increasing from 8.4% .  Glucose logs and insulin  administration records pertaining to this visit,  to be scanned into patient's records.  Recent labs reviewed.   - I have re-counseled the patient on diet management and weight loss  by adopting a carbohydrate restricted / protein rich  Diet.  - Suggestion is made for patient to avoid simple carbohydrates   from their diet including Cakes , Desserts, Ice Cream,  Soda (  diet and regular) , Sweet Tea , Candies,  Chips, Cookies, Artificial Sweeteners,   and "Sugar-free" Products .  This will help patient to have stable blood glucose profile and potentially avoid unintended  Weight gain.  - Patient  is advised to stick to a routine mealtimes to eat 3 meals  a day and avoid unnecessary snacks ( to snack only to correct hypoglycemia).  - The patient  has been  scheduled with Aaron Potter, Aaron Potter, Aaron Potter for individualized DM education.  - I have approached patient with the following individualized plan to manage diabetes and patient agrees. -Continue Lantus 80 units qhs, increase  Novolog to 22 units TIDAC plus correction dose , with strict monitoring of BG AC and HS .   -Patient is encouraged to call clinic for blood glucose levels less than 70 or above 300 mg /dl. -I will continue Metformin 1gm PO BID and continue Tanzeum 50 milligrams weekly.  -He is following with Aaron Potter, Aaron Potter for DM education. - He did not tolerate  Invokana , was d/ced.  - Patient specific target  for A1c; LDL, HDL, Triglycerides, and  Waist Circumference were discussed in detail.  2) BP/HTN: Uncontrolled. Continue current medications including ACEI/ARB. 3) Lipids/HPL:  continue statins. 4)  Weight/Diet: Aaron Potter consult in progress, exercise, and carbohydrates information provided.  5) Chronic Care/Health Maintenance:  -Patient  Is  on ACEI/ARB and Statin medications and encouraged to continue to follow up with Ophthalmology, Podiatrist at least yearly or according to recommendations, and advised to  stay away from  smoking. I have recommended yearly flu vaccine and pneumonia vaccination at least every 5 years; moderate intensity exercise for up to 150 minutes weekly; and  sleep for at least 7 hours a day.  - 25 minutes of time was spent on the care of this patient , 50% of which was applied for counseling on diabetes complications and their preventions.  - I advised patient to maintain close follow up with Aaron Potter,Aaron Potter, Aaron Potter for primary care needs.  Patient is asked to bring meter and  blood glucose logs during their next visit.   Follow up plan: -Return in about 3 months (around 09/29/2015) for follow up with pre-visit labs, meter, and logs.  Aaron LunchGebre Crystel Demarco, Aaron Potter Phone: [phone]  Fax: [phone]   06/29/2015, 5:36 PM

## 2015-07-13 LAB — HM DIABETES FOOT EXAM

## 2015-07-27 ENCOUNTER — Other Ambulatory Visit: Payer: Self-pay | Admitting: "Endocrinology

## 2015-08-03 ENCOUNTER — Other Ambulatory Visit: Payer: Self-pay | Admitting: "Endocrinology

## 2015-08-17 ENCOUNTER — Other Ambulatory Visit: Payer: Self-pay | Admitting: "Endocrinology

## 2015-08-28 ENCOUNTER — Other Ambulatory Visit: Payer: Self-pay | Admitting: "Endocrinology

## 2015-09-24 ENCOUNTER — Other Ambulatory Visit: Payer: Self-pay | Admitting: "Endocrinology

## 2015-09-24 LAB — COMPLETE METABOLIC PANEL WITH GFR
ALBUMIN: 4.1 g/dL (ref 3.6–5.1)
ALT: 37 U/L (ref 9–46)
AST: 38 U/L — AB (ref 10–35)
Alkaline Phosphatase: 67 U/L (ref 40–115)
BUN: 15 mg/dL (ref 7–25)
CHLORIDE: 103 mmol/L (ref 98–110)
CO2: 23 mmol/L (ref 20–31)
Calcium: 9.7 mg/dL (ref 8.6–10.3)
Creat: 0.91 mg/dL (ref 0.70–1.33)
GFR, Est African American: >89 mL/min (ref >=60)
GFR, Est Non African American: >89 mL/min (ref >=60)
GLUCOSE: 249 mg/dL — AB (ref 65–99)
POTASSIUM: 4.8 mmol/L (ref 3.5–5.3)
SODIUM: 138 mmol/L (ref 135–146)
Total Bilirubin: 0.3 mg/dL (ref 0.2–1.2)
Total Protein: 6.4 g/dL (ref 6.1–8.1)

## 2015-09-25 LAB — HEMOGLOBIN A1C
Hgb A1c MFr Bld: 9.1 % — ABNORMAL HIGH (ref <5.7)
Mean Plasma Glucose: 214 mg/dL

## 2015-10-01 ENCOUNTER — Ambulatory Visit (INDEPENDENT_AMBULATORY_CARE_PROVIDER_SITE_OTHER): Payer: Medicaid Other | Admitting: "Endocrinology

## 2015-10-01 ENCOUNTER — Encounter: Payer: Self-pay | Admitting: "Endocrinology

## 2015-10-01 VITALS — BP 170/76 | HR 68 | Ht 72.0 in | Wt >= 6400 oz

## 2015-10-01 DIAGNOSIS — I10 Essential (primary) hypertension: Secondary | ICD-10-CM

## 2015-10-01 DIAGNOSIS — E785 Hyperlipidemia, unspecified: Secondary | ICD-10-CM | POA: Diagnosis not present

## 2015-10-01 DIAGNOSIS — IMO0002 Reserved for concepts with insufficient information to code with codable children: Secondary | ICD-10-CM

## 2015-10-01 DIAGNOSIS — E1151 Type 2 diabetes mellitus with diabetic peripheral angiopathy without gangrene: Secondary | ICD-10-CM

## 2015-10-01 DIAGNOSIS — Z794 Long term (current) use of insulin: Secondary | ICD-10-CM | POA: Diagnosis not present

## 2015-10-01 DIAGNOSIS — E1165 Type 2 diabetes mellitus with hyperglycemia: Secondary | ICD-10-CM | POA: Diagnosis not present

## 2015-10-01 MED ORDER — INSULIN ASPART 100 UNIT/ML FLEXPEN: 25.0000 [IU] | PEN_INJECTOR | Freq: Three times a day (TID) | SUBCUTANEOUS | 2 refills | Status: DC

## 2015-10-01 MED ORDER — INSULIN GLARGINE 100 UNIT/ML SOLOSTAR PEN: 90.0000 [IU] | PEN_INJECTOR | Freq: Every day | SUBCUTANEOUS | 3 refills | Status: DC

## 2015-10-01 NOTE — Progress Notes (Signed)
Subjective:    Patient ID: Aaron Potter, male    DOB: 01/13/1960, PCP Fredirick MaudlinHAWKINS,EDWARD L, MD   Past Medical History:  Diagnosis Date  . Asthma   . Balanitis   . Cellulitis   . COPD (chronic obstructive pulmonary disease) (HCC)   . Diabetes mellitus without complication (HCC)   . Gout   . Hypertension   . Lichen sclerosus   . Obesity, morbid (more than 100 lbs over ideal weight or BMI > 40) (HCC)   . Sleep apnea   . Urticaria    Past Surgical History:  Procedure Laterality Date  . CHOLECYSTECTOMY     Social History   Social History  . Marital status: Single    Spouse name: N/A  . Number of children: N/A  . Years of education: N/A   Social History Main Topics  . Smoking status: Never Smoker  . Smokeless tobacco: Never Used  . Alcohol use No  . Drug use: No  . Sexual activity: Yes   Other Topics Concern  . None   Social History Narrative  . None   Outpatient Encounter Prescriptions as of 10/01/2015  Medication Sig  . ACCU-CHEK AVIVA PLUS test strip USE TO CHECK BLOOD SUGAR FOUR TIMES A DAY.  Marland Kitchen. albuterol (PROAIR HFA) 108 (90 BASE) MCG/ACT inhaler Inhale 2 puffs into the lungs every 4 (four) hours as needed for wheezing or shortness of breath. Sometimes only uses one puff  . albuterol (PROVENTIL HFA;VENTOLIN HFA) 108 (90 BASE) MCG/ACT inhaler Inhale 2 puffs into the lungs 2 (two) times daily as needed for wheezing or shortness of breath.  . allopurinol (ZYLOPRIM) 300 MG tablet Take 300 mg by mouth daily.    Marland Kitchen. ALPRAZolam (XANAX) 1 MG tablet Take 1 mg by mouth 4 (four) times daily.    . benazepril (LOTENSIN) 5 MG tablet TAKE (1) TABLET BY MOUTH ONCE DAILY.  . diphenhydrAMINE (BENADRYL) 25 mg capsule Take 50 mg by mouth every 6 (six) hours as needed for itching.  Marland Kitchen. HYDROcodone-acetaminophen (NORCO) 10-325 MG per tablet Take 1 tablet by mouth 4 (four) times daily.  . indomethacin (INDOCIN) 50 MG capsule Take 50 mg by mouth daily.   . insulin aspart (NOVOLOG FLEXPEN)  100 UNIT/ML FlexPen Inject 25-31 Units into the skin 3 (three) times daily with meals.  . Insulin Glargine (LANTUS SOLOSTAR) 100 UNIT/ML Solostar Pen Inject 90 Units into the skin at bedtime.  Marland Kitchen. LITETOUCH PEN NEEDLES 31G X 8 MM MISC USE 5 TIMES DAILY.  . metFORMIN (GLUCOPHAGE) 500 MG tablet Take 1,000 mg by mouth 2 (two) times daily with a meal.   . Multiple Vitamin (MULTIVITAMIN WITH MINERALS) TABS tablet Take 1 tablet by mouth daily.  . pravastatin (PRAVACHOL) 40 MG tablet Take 40 mg by mouth daily.  Marland Kitchen. TANZEUM 50 MG PEN INJECT 50 MG SUB-Q ONCE A WEEK.  . tiotropium (SPIRIVA) 18 MCG inhalation capsule Place 18 mcg into inhaler and inhale daily.  . verapamil (CALAN-SR) 240 MG CR tablet Take 240 mg by mouth daily.  . Vitamin D, Ergocalciferol, (DRISDOL) 50000 units CAPS capsule TAKE 1 CAPSULE BY MOUTH ONCE A WEEK.  Marland Kitchen. zolpidem (AMBIEN) 10 MG tablet Take 10 mg by mouth at bedtime as needed for sleep.   . [DISCONTINUED] Insulin Glargine (LANTUS SOLOSTAR) 100 UNIT/ML Solostar Pen Inject 80 Units into the skin at bedtime.  . [DISCONTINUED] NOVOLOG FLEXPEN 100 UNIT/ML FlexPen INJECT 22-28 UNITS SUBCUTANEOUSLY 3 TIMES DAILY WITH MEALS   No facility-administered encounter  medications on file as of 10/01/2015.    ALLERGIES: Allergies  Allergen Reactions  . Biaxin [Clarithromycin] Anaphylaxis and Shortness Of Breath  . Peanut-Containing Drug Products Anaphylaxis   VACCINATION STATUS:  There is no immunization history on file for this patient.  Diabetes  He presents for his follow-up diabetic visit. He has type 2 diabetes mellitus. Onset time: He was diagnosed at approximate age of 58 years. His disease course has been worsening. There are no hypoglycemic associated symptoms. Pertinent negatives for hypoglycemia include no confusion, headaches, pallor or seizures. Associated symptoms include polydipsia and polyuria. Pertinent negatives for diabetes include no chest pain, no fatigue, no polyphagia and  no weakness. There are no hypoglycemic complications. Symptoms are worsening. Diabetic complications include PVD. Risk factors for coronary artery disease include dyslipidemia, diabetes mellitus, family history, male sex, obesity and sedentary lifestyle. Current diabetic treatment includes intensive insulin program. He is compliant with treatment most of the time. His weight is increasing steadily. He is following a generally unhealthy diet. He has had a previous visit with a dietitian. He never participates in exercise. His home blood glucose trend is increasing steadily. His breakfast blood glucose range is generally >200 mg/dl. His lunch blood glucose range is generally >200 mg/dl. His dinner blood glucose range is generally >200 mg/dl. His overall blood glucose range is >200 mg/dl. An ACE inhibitor/angiotensin II receptor blocker is being taken.  Hyperlipidemia  This is a chronic problem. The current episode started more than 1 year ago. The problem is uncontrolled. Exacerbating diseases include diabetes and obesity. Pertinent negatives include no chest pain, myalgias or shortness of breath. Current antihyperlipidemic treatment includes statins. Risk factors for coronary artery disease include diabetes mellitus, dyslipidemia, hypertension, male sex, obesity and a sedentary lifestyle.  Hypertension  This is a chronic problem. The current episode started more than 1 year ago. The problem is uncontrolled. Pertinent negatives include no chest pain, headaches, neck pain, palpitations or shortness of breath. Past treatments include ACE inhibitors and calcium channel blockers. The current treatment provides no improvement. Compliance problems include diet.  Hypertensive end-organ damage includes PVD.     Review of Systems  Constitutional: Negative for fatigue and unexpected weight change.  HENT: Negative for dental problem, mouth sores and trouble swallowing.   Eyes: Negative for visual disturbance.   Respiratory: Negative for cough, choking, chest tightness, shortness of breath and wheezing.   Cardiovascular: Negative for chest pain, palpitations and leg swelling.  Gastrointestinal: Negative for abdominal distention, abdominal pain, constipation, diarrhea, nausea and vomiting.  Endocrine: Positive for polydipsia and polyuria. Negative for polyphagia.  Genitourinary: Negative for dysuria, flank pain, hematuria and urgency.  Musculoskeletal: Negative for back pain, gait problem, myalgias and neck pain.  Skin: Negative for pallor, rash and wound.  Neurological: Negative for seizures, syncope, weakness, numbness and headaches.  Psychiatric/Behavioral: Negative.  Negative for confusion and dysphoric mood.    Objective:    BP (!) 170/76   Pulse 68   Ht 6' (1.829 m)   Wt (!) 418 lb (189.6 kg)   BMI 56.69 kg/m   Wt Readings from Last 3 Encounters:  10/01/15 (!) 418 lb (189.6 kg)  06/29/15 (!) 415 lb (188.2 kg)  03/20/15 (!) 415 lb (188.2 kg)    Physical Exam  Constitutional: He is oriented to person, place, and time. He appears well-developed and well-nourished. He is cooperative. No distress.  HENT:  Head: Normocephalic and atraumatic.  Eyes: EOM are normal.  Neck: Normal range of motion. Neck supple.  No tracheal deviation present. No thyromegaly present.  Cardiovascular: Normal rate, S1 normal, S2 normal and normal heart sounds.  Exam reveals no gallop.   No murmur heard. Pulses:      Dorsalis pedis pulses are 0 on the right side, and 0 on the left side.       Posterior tibial pulses are 0 on the right side, and 0 on the left side.  Pulmonary/Chest: Breath sounds normal. No respiratory distress. He has no wheezes.  Abdominal: Soft. Bowel sounds are normal. He exhibits no distension. There is no tenderness. There is no guarding and no CVA tenderness.  Musculoskeletal: He exhibits no edema.       Right shoulder: He exhibits no swelling and no deformity.  Neurological: He is alert  and oriented to person, place, and time. He has normal strength and normal reflexes. No cranial nerve deficit or sensory deficit. Gait normal.  Skin: Skin is warm and dry. No rash noted. No cyanosis. Nails show no clubbing.  Psychiatric: He has a normal mood and affect. His speech is normal and behavior is normal. Judgment and thought content normal. Cognition and memory are normal.    Results for orders placed or performed in visit on 09/24/15  COMPLETE METABOLIC PANEL WITH GFR  Result Value Ref Range   Sodium 138 135 - 146 mmol/L   Potassium 4.8 3.5 - 5.3 mmol/L   Chloride 103 98 - 110 mmol/L   CO2 23 20 - 31 mmol/L   Glucose, Bld 249 (H) 65 - 99 mg/dL   BUN 15 7 - 25 mg/dL   Creat 1.61 0.96 - 0.45 mg/dL   Total Bilirubin 0.3 0.2 - 1.2 mg/dL   Alkaline Phosphatase 67 40 - 115 U/L   AST 38 (H) 10 - 35 U/L   ALT 37 9 - 46 U/L   Total Protein 6.4 6.1 - 8.1 g/dL   Albumin 4.1 3.6 - 5.1 g/dL   Calcium 9.7 8.6 - 40.9 mg/dL   GFR, Est African American >89 >=60 mL/min   GFR, Est Non African American >89 >=60 mL/min  Hemoglobin A1c  Result Value Ref Range   Hgb A1c MFr Bld 9.1 (H) <5.7 %   Mean Plasma Glucose 214 mg/dL   Diabetic Labs (most recent): Lab Results  Component Value Date   HGBA1C 9.1 (H) 09/24/2015   HGBA1C 8.9 (H) 06/19/2015   HGBA1C 8.4 (H) 03/12/2015   Lipid Panel     Component Value Date/Time   CHOL 172 03/12/2015 1200   TRIG 155 (H) 03/12/2015 1200   HDL 35 (L) 03/12/2015 1200   CHOLHDL 4.9 03/12/2015 1200   VLDL 31 (H) 03/12/2015 1200   LDLCALC 106 03/12/2015 1200     Assessment & Plan:   1. Uncontrolled type 2 diabetes mellitus with diabetic peripheral angiopathy without gangrene, with long-term current use of insulin (HCC) - patient remains at a high risk for more acute and chronic complications of diabetes which include CAD, CVA, CKD, retinopathy, and neuropathy. These are all discussed in detail with the patient.  Patient came with better than  before but still above target glucose profile, and  recent A1c  9.1% increasing from 8.4% .  Glucose logs and insulin administration records pertaining to this visit,  to be scanned into patient's records.  Recent labs reviewed.   - I have re-counseled the patient on diet management and weight loss  by adopting a carbohydrate restricted / protein rich  Diet.  - Suggestion is  made for patient to avoid simple carbohydrates   from their diet including Cakes , Desserts, Ice Cream,  Soda (  diet and regular) , Sweet Tea , Candies,  Chips, Cookies, Artificial Sweeteners,   and "Sugar-free" Products .  This will help patient to have stable blood glucose profile and potentially avoid unintended  Weight gain.  - Patient is advised to stick to a routine mealtimes to eat 3 meals  a day and avoid unnecessary snacks ( to snack only to correct hypoglycemia).  - The patient  has been  scheduled with Norm Salt, RDN, CDE for individualized DM education.  - I have approached patient with the following individualized plan to manage diabetes and patient agrees. - If he continues to require increasingly larger dose of insulin, he would be considered for U500 by next visit. - In the meantime, I advised him to increase Lantus to 90 units qhs, increase  Novolog to 25 units TIDAC plus correction dose , with strict monitoring of BG AC and HS .   -Patient is encouraged to call clinic for blood glucose levels less than 70 or above 300 mg /dl. -I will continue Metformin 1gm PO BID and continue Tanzeum 50 milligrams weekly. - He is a perfect candidate for bariatric surgery, for the first time he is open to considering that. I gave him a brochure and contact number to call and initiate the process.  -He is following with Norm Salt, CDE for DM education. - He did not tolerate  Invokana , was d/ced.  - Patient specific target  for A1c; LDL, HDL, Triglycerides, and  Waist Circumference were discussed in detail.  2)  BP/HTN: Uncontrolled. Continue current medications including ACEI/ARB. 3) Lipids/HPL:  continue statins. 4)  Weight/Diet: CDE consult in progress, exercise, and carbohydrates information provided.  5) Chronic Care/Health Maintenance:  -Patient  Is  on ACEI/ARB and Statin medications and encouraged to continue to follow up with Ophthalmology, Podiatrist at least yearly or according to recommendations, and advised to  stay away from smoking. I have recommended yearly flu vaccine and pneumonia vaccination at least every 5 years; moderate intensity exercise for up to 150 minutes weekly; and  sleep for at least 7 hours a day.  - 25 minutes of time was spent on the care of this patient , 50% of which was applied for counseling on diabetes complications and their preventions.  - I advised patient to maintain close follow up with HAWKINS,EDWARD L, MD for primary care needs.  Patient is asked to bring meter and  blood glucose logs during their next visit.   Follow up plan: -Return in about 3 months (around 12/31/2015) for meter, and logs.  Marquis Lunch, MD Phone: [phone]  Fax: [phone]   10/01/2015, 10:22 AM

## 2015-10-01 NOTE — Patient Instructions (Signed)

## 2015-10-19 ENCOUNTER — Other Ambulatory Visit: Payer: Self-pay | Admitting: "Endocrinology

## 2015-12-01 ENCOUNTER — Other Ambulatory Visit: Payer: Self-pay | Admitting: "Endocrinology

## 2015-12-07 ENCOUNTER — Other Ambulatory Visit: Payer: Self-pay | Admitting: "Endocrinology

## 2015-12-23 ENCOUNTER — Other Ambulatory Visit: Payer: Self-pay | Admitting: "Endocrinology

## 2015-12-24 LAB — COMPREHENSIVE METABOLIC PANEL
ALT: 27 U/L (ref 9–46)
AST: 24 U/L (ref 10–35)
Albumin: 4.1 g/dL (ref 3.6–5.1)
Alkaline Phosphatase: 64 U/L (ref 40–115)
BILIRUBIN TOTAL: 0.4 mg/dL (ref 0.2–1.2)
BUN: 14 mg/dL (ref 7–25)
CO2: 30 mmol/L (ref 20–31)
CREATININE: 0.82 mg/dL (ref 0.70–1.33)
Calcium: 9.9 mg/dL (ref 8.6–10.3)
Chloride: 99 mmol/L (ref 98–110)
Glucose, Bld: 261 mg/dL — ABNORMAL HIGH (ref 65–99)
Potassium: 5.3 mmol/L (ref 3.5–5.3)
SODIUM: 136 mmol/L (ref 135–146)
TOTAL PROTEIN: 6.7 g/dL (ref 6.1–8.1)

## 2015-12-24 LAB — HEMOGLOBIN A1C
HEMOGLOBIN A1C: 8.8 % — AB (ref <5.7)
Mean Plasma Glucose: 206 mg/dL

## 2015-12-29 ENCOUNTER — Other Ambulatory Visit: Payer: Self-pay | Admitting: "Endocrinology

## 2016-01-01 ENCOUNTER — Encounter: Payer: Self-pay | Admitting: "Endocrinology

## 2016-01-01 ENCOUNTER — Ambulatory Visit (INDEPENDENT_AMBULATORY_CARE_PROVIDER_SITE_OTHER): Payer: Medicaid Other | Admitting: "Endocrinology

## 2016-01-01 VITALS — BP 147/78 | HR 76 | Ht 72.0 in | Wt >= 6400 oz

## 2016-01-01 DIAGNOSIS — I10 Essential (primary) hypertension: Secondary | ICD-10-CM

## 2016-01-01 DIAGNOSIS — E782 Mixed hyperlipidemia: Secondary | ICD-10-CM

## 2016-01-01 DIAGNOSIS — Z794 Long term (current) use of insulin: Secondary | ICD-10-CM

## 2016-01-01 DIAGNOSIS — IMO0002 Reserved for concepts with insufficient information to code with codable children: Secondary | ICD-10-CM

## 2016-01-01 DIAGNOSIS — E1151 Type 2 diabetes mellitus with diabetic peripheral angiopathy without gangrene: Secondary | ICD-10-CM | POA: Diagnosis not present

## 2016-01-01 DIAGNOSIS — E1165 Type 2 diabetes mellitus with hyperglycemia: Secondary | ICD-10-CM | POA: Diagnosis not present

## 2016-01-01 MED ORDER — INSULIN GLARGINE 100 UNIT/ML SOLOSTAR PEN: 100.0000 [IU] | PEN_INJECTOR | Freq: Every day | SUBCUTANEOUS | 3 refills | Status: DC

## 2016-01-01 NOTE — Progress Notes (Signed)
Subjective:    Patient ID: Aaron Potter, male    DOB: Aug 09, 1960, PCP Fredirick Maudlin, MD   Past Medical History:  Diagnosis Date  . Asthma   . Balanitis   . Cellulitis   . COPD (chronic obstructive pulmonary disease) (HCC)   . Diabetes mellitus without complication (HCC)   . Gout   . Hypertension   . Lichen sclerosus   . Obesity, morbid (more than 100 lbs over ideal weight or BMI > 40) (HCC)   . Sleep apnea   . Urticaria    Past Surgical History:  Procedure Laterality Date  . CHOLECYSTECTOMY     Social History   Social History  . Marital status: Single    Spouse name: N/A  . Number of children: N/A  . Years of education: N/A   Social History Main Topics  . Smoking status: Never Smoker  . Smokeless tobacco: Never Used  . Alcohol use No  . Drug use: No  . Sexual activity: Yes   Other Topics Concern  . None   Social History Narrative  . None   Outpatient Encounter Prescriptions as of 01/01/2016  Medication Sig  . ACCU-CHEK AVIVA PLUS test strip USE TO CHECK BLOOD SUGAR FOUR TIMES A DAY.  Marland Kitchen albuterol (PROAIR HFA) 108 (90 BASE) MCG/ACT inhaler Inhale 2 puffs into the lungs every 4 (four) hours as needed for wheezing or shortness of breath. Sometimes only uses one puff  . albuterol (PROVENTIL HFA;VENTOLIN HFA) 108 (90 BASE) MCG/ACT inhaler Inhale 2 puffs into the lungs 2 (two) times daily as needed for wheezing or shortness of breath.  . allopurinol (ZYLOPRIM) 300 MG tablet Take 300 mg by mouth daily.    Marland Kitchen ALPRAZolam (XANAX) 1 MG tablet Take 1 mg by mouth 4 (four) times daily.    . benazepril (LOTENSIN) 5 MG tablet TAKE (1) TABLET BY MOUTH ONCE DAILY.  . diphenhydrAMINE (BENADRYL) 25 mg capsule Take 50 mg by mouth every 6 (six) hours as needed for itching.  Marland Kitchen HYDROcodone-acetaminophen (NORCO) 10-325 MG per tablet Take 1 tablet by mouth 4 (four) times daily.  . indomethacin (INDOCIN) 50 MG capsule Take 50 mg by mouth daily.   . Insulin Glargine (LANTUS  SOLOSTAR) 100 UNIT/ML Solostar Pen Inject 100 Units into the skin at bedtime.  Marland Kitchen LITETOUCH PEN NEEDLES 31G X 8 MM MISC USE 5 TIMES DAILY.  . metFORMIN (GLUCOPHAGE) 500 MG tablet Take 1,000 mg by mouth 2 (two) times daily with a meal.   . Multiple Vitamin (MULTIVITAMIN WITH MINERALS) TABS tablet Take 1 tablet by mouth daily.  Marland Kitchen NOVOLOG FLEXPEN 100 UNIT/ML FlexPen INJECT 25-31 UNITS IN TO THE SKIN 3 TIMES DAILY WITH MEALS.  Marland Kitchen pravastatin (PRAVACHOL) 40 MG tablet Take 40 mg by mouth daily.  Marland Kitchen TANZEUM 50 MG PEN INJECT 50MG  SUBCUTANEOUSLY ONCE WEEKLY  . tiotropium (SPIRIVA) 18 MCG inhalation capsule Place 18 mcg into inhaler and inhale daily.  . verapamil (CALAN-SR) 240 MG CR tablet Take 240 mg by mouth daily.  . Vitamin D, Ergocalciferol, (DRISDOL) 50000 units CAPS capsule TAKE 1 CAPSULE BY MOUTH ONCE A WEEK.  Marland Kitchen zolpidem (AMBIEN) 10 MG tablet Take 10 mg by mouth at bedtime as needed for sleep.   . [DISCONTINUED] Insulin Glargine (LANTUS SOLOSTAR) 100 UNIT/ML Solostar Pen Inject 90 Units into the skin at bedtime.   No facility-administered encounter medications on file as of 01/01/2016.    ALLERGIES: Allergies  Allergen Reactions  . Biaxin [Clarithromycin] Anaphylaxis and Shortness  Of Breath  . Peanut-Containing Drug Products Anaphylaxis   VACCINATION STATUS:  There is no immunization history on file for this patient.  Diabetes  He presents for his follow-up diabetic visit. He has type 2 diabetes mellitus. Onset time: He was diagnosed at approximate age of 55 years. His disease course has been improving. There are no hypoglycemic associated symptoms. Pertinent negatives for hypoglycemia include no confusion, headaches, pallor or seizures. Associated symptoms include polydipsia and polyuria. Pertinent negatives for diabetes include no chest pain, no fatigue, no polyphagia and no weakness. There are no hypoglycemic complications. Symptoms are improving. Diabetic complications include PVD. Risk  factors for coronary artery disease include dyslipidemia, diabetes mellitus, family history, male sex, obesity and sedentary lifestyle. Current diabetic treatment includes intensive insulin program. He is compliant with treatment most of the time. His weight is stable. He is following a generally unhealthy diet. He has had a previous visit with a dietitian. He never participates in exercise. His home blood glucose trend is increasing steadily. His breakfast blood glucose range is generally 180-200 mg/dl. His lunch blood glucose range is generally 180-200 mg/dl. His dinner blood glucose range is generally 180-200 mg/dl. His overall blood glucose range is 180-200 mg/dl. An ACE inhibitor/angiotensin II receptor blocker is being taken.  Hyperlipidemia  This is a chronic problem. The current episode started more than 1 year ago. The problem is uncontrolled. Exacerbating diseases include diabetes and obesity. Pertinent negatives include no chest pain, myalgias or shortness of breath. Current antihyperlipidemic treatment includes statins. Risk factors for coronary artery disease include diabetes mellitus, dyslipidemia, hypertension, male sex, obesity and a sedentary lifestyle.  Hypertension  This is a chronic problem. The current episode started more than 1 year ago. The problem is uncontrolled. Pertinent negatives include no chest pain, headaches, neck pain, palpitations or shortness of breath. Past treatments include ACE inhibitors and calcium channel blockers. The current treatment provides no improvement. Compliance problems include diet.  Hypertensive end-organ damage includes PVD.     Review of Systems  Constitutional: Negative for fatigue and unexpected weight change.  HENT: Negative for dental problem, mouth sores and trouble swallowing.   Eyes: Negative for visual disturbance.  Respiratory: Negative for cough, choking, chest tightness, shortness of breath and wheezing.   Cardiovascular: Negative for  chest pain, palpitations and leg swelling.  Gastrointestinal: Negative for abdominal distention, abdominal pain, constipation, diarrhea, nausea and vomiting.  Endocrine: Positive for polydipsia and polyuria. Negative for polyphagia.  Genitourinary: Negative for dysuria, flank pain, hematuria and urgency.  Musculoskeletal: Negative for back pain, gait problem, myalgias and neck pain.  Skin: Negative for pallor, rash and wound.  Neurological: Negative for seizures, syncope, weakness, numbness and headaches.  Psychiatric/Behavioral: Negative.  Negative for confusion and dysphoric mood.    Objective:    BP (!) 147/78   Pulse 76   Ht 6' (1.829 m)   Wt (!) 418 lb (189.6 kg)   BMI 56.69 kg/m   Wt Readings from Last 3 Encounters:  01/01/16 (!) 418 lb (189.6 kg)  10/01/15 (!) 418 lb (189.6 kg)  06/29/15 (!) 415 lb (188.2 kg)    Physical Exam  Constitutional: He is oriented to person, place, and time. He appears well-developed and well-nourished. He is cooperative. No distress.  HENT:  Head: Normocephalic and atraumatic.  Eyes: EOM are normal.  Neck: Normal range of motion. Neck supple. No tracheal deviation present. No thyromegaly present.  Cardiovascular: Normal rate, S1 normal, S2 normal and normal heart sounds.  Exam reveals  no gallop.   No murmur heard. Pulses:      Dorsalis pedis pulses are 0 on the right side, and 0 on the left side.       Posterior tibial pulses are 0 on the right side, and 0 on the left side.  Pulmonary/Chest: Breath sounds normal. No respiratory distress. He has no wheezes.  Abdominal: Soft. Bowel sounds are normal. He exhibits no distension. There is no tenderness. There is no guarding and no CVA tenderness.  Musculoskeletal: He exhibits no edema.       Right shoulder: He exhibits no swelling and no deformity.  Neurological: He is alert and oriented to person, place, and time. He has normal strength and normal reflexes. No cranial nerve deficit or sensory  deficit. Gait normal.  Skin: Skin is warm and dry. No rash noted. No cyanosis. Nails show no clubbing.  Psychiatric: He has a normal mood and affect. His speech is normal and behavior is normal. Judgment and thought content normal. Cognition and memory are normal.    Results for orders placed or performed in visit on 12/23/15  Comprehensive metabolic panel  Result Value Ref Range   Sodium 136 135 - 146 mmol/L   Potassium 5.3 3.5 - 5.3 mmol/L   Chloride 99 98 - 110 mmol/L   CO2 30 20 - 31 mmol/L   Glucose, Bld 261 (H) 65 - 99 mg/dL   BUN 14 7 - 25 mg/dL   Creat 1.61 0.96 - 0.45 mg/dL   Total Bilirubin 0.4 0.2 - 1.2 mg/dL   Alkaline Phosphatase 64 40 - 115 U/L   AST 24 10 - 35 U/L   ALT 27 9 - 46 U/L   Total Protein 6.7 6.1 - 8.1 g/dL   Albumin 4.1 3.6 - 5.1 g/dL   Calcium 9.9 8.6 - 40.9 mg/dL  Hemoglobin W1X  Result Value Ref Range   Hgb A1c MFr Bld 8.8 (H) <5.7 %   Mean Plasma Glucose 206 mg/dL   Diabetic Labs (most recent): Lab Results  Component Value Date   HGBA1C 8.8 (H) 12/23/2015   HGBA1C 9.1 (H) 09/24/2015   HGBA1C 8.9 (H) 06/19/2015   Lipid Panel     Component Value Date/Time   CHOL 172 03/12/2015 1200   TRIG 155 (H) 03/12/2015 1200   HDL 35 (L) 03/12/2015 1200   CHOLHDL 4.9 03/12/2015 1200   VLDL 31 (H) 03/12/2015 1200   LDLCALC 106 03/12/2015 1200     Assessment & Plan:   1. Uncontrolled type 2 diabetes mellitus with diabetic peripheral angiopathy without gangrene, with long-term current use of insulin (HCC) - patient remains at a high risk for more acute and chronic complications of diabetes which include CAD, CVA, CKD, retinopathy, and neuropathy. These are all discussed in detail with the patient.  Patient came with better than before but still above target glucose profile, and  recent A1c   8.8% slightly improving from 9.1% .  Glucose logs and insulin administration records pertaining to this visit,  to be scanned into patient's records.  Recent labs  reviewed.   - I have re-counseled the patient on diet management and weight loss  by adopting a carbohydrate restricted / protein rich  Diet.  - Suggestion is made for patient to avoid simple carbohydrates   from their diet including Cakes , Desserts, Ice Cream,  Soda (  diet and regular) , Sweet Tea , Candies,  Chips, Cookies, Artificial Sweeteners,   and "Sugar-free" Products .  This will help patient to have stable blood glucose profile and potentially avoid unintended  Weight gain.  - Patient is advised to stick to a routine mealtimes to eat 3 meals  a day and avoid unnecessary snacks ( to snack only to correct hypoglycemia).  - The patient  has been  scheduled with Norm SaltPenny Crumpton, RDN, CDE for individualized DM education.  - I have approached patient with the following individualized plan to manage diabetes and patient agrees. - If he continues to require increasingly larger dose of insulin, he would be considered for U500 by next visit. - In the meantime, I advised him to increase Lantus to 100 units qhs, continue  Novolog  25 units TIDAC plus correction dose , with strict monitoring of BG AC and HS .   -Patient is encouraged to call clinic for blood glucose levels less than 70 or above 300 mg /dl. -I will continue Metformin 1gm PO BID and continue Tanzeum 50 milligrams weekly. - He is a perfect candidate for bariatric surgery, for the first time he is open to considering that. I gave him a brochure and contact number to call and initiate the process.  -He is following with Norm SaltPenny Crumpton, CDE for DM education. - He did not tolerate  Invokana , was d/ced.  - Patient specific target  for A1c; LDL, HDL, Triglycerides, and  Waist Circumference were discussed in detail.  2) BP/HTN: Uncontrolled. Continue current medications including ACEI/ARB. 3) Lipids/HPL:  continue statins. 4)  Weight/Diet: CDE consult in progress, exercise, and carbohydrates information provided.  5) Chronic  Care/Health Maintenance:  -Patient  Is  on ACEI/ARB and Statin medications and encouraged to continue to follow up with Ophthalmology, Podiatrist at least yearly or according to recommendations, and advised to  stay away from smoking. I have recommended yearly flu vaccine and pneumonia vaccination at least every 5 years; moderate intensity exercise for up to 150 minutes weekly; and  sleep for at least 7 hours a day.  - 25 minutes of time was spent on the care of this patient , 50% of which was applied for counseling on diabetes complications and their preventions.  - I advised patient to maintain close follow up with HAWKINS,EDWARD L, MD for primary care needs.  Patient is asked to bring meter and  blood glucose logs during their next visit.   Follow up plan: -Return in about 3 months (around 03/31/2016) for follow up with pre-visit labs, meter, and logs.  Marquis LunchGebre Maycen Degregory, MD Phone: [phone]  Fax: [phone]   01/01/2016, 10:26 AM

## 2016-01-01 NOTE — Patient Instructions (Signed)

## 2016-01-28 ENCOUNTER — Other Ambulatory Visit: Payer: Self-pay | Admitting: "Endocrinology

## 2016-02-23 ENCOUNTER — Other Ambulatory Visit: Payer: Self-pay | Admitting: "Endocrinology

## 2016-03-21 ENCOUNTER — Other Ambulatory Visit: Payer: Self-pay | Admitting: "Endocrinology

## 2016-03-25 ENCOUNTER — Other Ambulatory Visit: Payer: Self-pay | Admitting: "Endocrinology

## 2016-03-25 LAB — COMPREHENSIVE METABOLIC PANEL
ALT: 34 U/L (ref 9–46)
AST: 37 U/L — ABNORMAL HIGH (ref 10–35)
Albumin: 3.9 g/dL (ref 3.6–5.1)
Alkaline Phosphatase: 75 U/L (ref 40–115)
BUN: 14 mg/dL (ref 7–25)
CALCIUM: 9.4 mg/dL (ref 8.6–10.3)
CO2: 27 mmol/L (ref 20–31)
CREATININE: 0.85 mg/dL (ref 0.70–1.33)
Chloride: 102 mmol/L (ref 98–110)
GLUCOSE: 254 mg/dL — AB (ref 65–99)
POTASSIUM: 4.5 mmol/L (ref 3.5–5.3)
Sodium: 137 mmol/L (ref 135–146)
Total Bilirubin: 0.5 mg/dL (ref 0.2–1.2)
Total Protein: 6.4 g/dL (ref 6.1–8.1)

## 2016-03-26 LAB — HEMOGLOBIN A1C
Hgb A1c MFr Bld: 9.4 % — ABNORMAL HIGH (ref <5.7)
Mean Plasma Glucose: 223 mg/dL

## 2016-04-01 ENCOUNTER — Ambulatory Visit: Payer: Medicaid Other | Admitting: "Endocrinology

## 2016-04-07 ENCOUNTER — Encounter: Payer: Self-pay | Admitting: "Endocrinology

## 2016-04-07 ENCOUNTER — Ambulatory Visit (INDEPENDENT_AMBULATORY_CARE_PROVIDER_SITE_OTHER): Payer: Medicaid Other | Admitting: "Endocrinology

## 2016-04-07 VITALS — BP 153/78 | HR 73 | Ht 72.0 in | Wt >= 6400 oz

## 2016-04-07 DIAGNOSIS — E782 Mixed hyperlipidemia: Secondary | ICD-10-CM | POA: Diagnosis not present

## 2016-04-07 DIAGNOSIS — E1165 Type 2 diabetes mellitus with hyperglycemia: Secondary | ICD-10-CM

## 2016-04-07 DIAGNOSIS — I10 Essential (primary) hypertension: Secondary | ICD-10-CM | POA: Diagnosis not present

## 2016-04-07 DIAGNOSIS — Z794 Long term (current) use of insulin: Secondary | ICD-10-CM

## 2016-04-07 DIAGNOSIS — E1151 Type 2 diabetes mellitus with diabetic peripheral angiopathy without gangrene: Secondary | ICD-10-CM | POA: Diagnosis not present

## 2016-04-07 DIAGNOSIS — IMO0002 Reserved for concepts with insufficient information to code with codable children: Secondary | ICD-10-CM

## 2016-04-07 MED ORDER — INSULIN ASPART 100 UNIT/ML FLEXPEN: 30.0000 [IU] | PEN_INJECTOR | Freq: Three times a day (TID) | SUBCUTANEOUS | 2 refills | Status: DC

## 2016-04-07 NOTE — Patient Instructions (Signed)

## 2016-04-07 NOTE — Progress Notes (Signed)
Subjective:    Patient ID: Aaron Potter, male    DOB: Dec 11, 1960, PCP Fredirick Maudlin, MD   Past Medical History:  Diagnosis Date  . Asthma   . Balanitis   . Cellulitis   . COPD (chronic obstructive pulmonary disease) (HCC)   . Diabetes mellitus without complication (HCC)   . Gout   . Hypertension   . Lichen sclerosus   . Obesity, morbid (more than 100 lbs over ideal weight or BMI > 40) (HCC)   . Sleep apnea   . Urticaria    Past Surgical History:  Procedure Laterality Date  . CHOLECYSTECTOMY     Social History   Social History  . Marital status: Single    Spouse name: N/A  . Number of children: N/A  . Years of education: N/A   Social History Main Topics  . Smoking status: Never Smoker  . Smokeless tobacco: Never Used  . Alcohol use No  . Drug use: No  . Sexual activity: Yes   Other Topics Concern  . None   Social History Narrative  . None   Outpatient Encounter Prescriptions as of 04/07/2016  Medication Sig  . ACCU-CHEK AVIVA PLUS test strip USE TO CHECK BLOOD SUGAR FOUR TIMES A DAY.  Marland Kitchen albuterol (PROAIR HFA) 108 (90 BASE) MCG/ACT inhaler Inhale 2 puffs into the lungs every 4 (four) hours as needed for wheezing or shortness of breath. Sometimes only uses one puff  . albuterol (PROVENTIL HFA;VENTOLIN HFA) 108 (90 BASE) MCG/ACT inhaler Inhale 2 puffs into the lungs 2 (two) times daily as needed for wheezing or shortness of breath.  . allopurinol (ZYLOPRIM) 300 MG tablet Take 300 mg by mouth daily.    Marland Kitchen ALPRAZolam (XANAX) 1 MG tablet Take 1 mg by mouth 4 (four) times daily.    . benazepril (LOTENSIN) 5 MG tablet TAKE (1) TABLET BY MOUTH ONCE DAILY.  . diphenhydrAMINE (BENADRYL) 25 mg capsule Take 50 mg by mouth every 6 (six) hours as needed for itching.  Marland Kitchen GLOBAL EASE INJECT PEN NEEDLES 31G X 8 MM MISC USE 5 TIMES DAILY.  Marland Kitchen HYDROcodone-acetaminophen (NORCO) 10-325 MG per tablet Take 1 tablet by mouth 4 (four) times daily.  . indomethacin (INDOCIN) 50 MG  capsule Take 50 mg by mouth daily.   . insulin aspart (NOVOLOG FLEXPEN) 100 UNIT/ML FlexPen Inject 30-36 Units into the skin 3 (three) times daily with meals.  . Insulin Glargine (LANTUS SOLOSTAR) 100 UNIT/ML Solostar Pen Inject 100 Units into the skin at bedtime.  . metFORMIN (GLUCOPHAGE) 500 MG tablet Take 1,000 mg by mouth 2 (two) times daily with a meal.   . Multiple Vitamin (MULTIVITAMIN WITH MINERALS) TABS tablet Take 1 tablet by mouth daily.  . pravastatin (PRAVACHOL) 40 MG tablet Take 40 mg by mouth daily.  Marland Kitchen TANZEUM 50 MG PEN INJECT  SUBCUTANEOUSLY ONCE WEEKLY  . tiotropium (SPIRIVA) 18 MCG inhalation capsule Place 18 mcg into inhaler and inhale daily.  . verapamil (CALAN-SR) 240 MG CR tablet Take 240 mg by mouth daily.  . Vitamin D, Ergocalciferol, (DRISDOL) 50000 units CAPS capsule TAKE 1 CAPSULE WEEKLY.  . zolpidem (AMBIEN) 10 MG tablet Take 10 mg by mouth at bedtime as needed for sleep.   . [DISCONTINUED] ACCU-CHEK AVIVA PLUS test strip USE TO CHECK BLOOD SUGAR FOUR TIMES A DAY.  . [DISCONTINUED] NOVOLOG FLEXPEN 100 UNIT/ML FlexPen INJECT 25-31 UNITS IN TO THE SKIN 3 TIMES DAILY WITH MEALS.   No facility-administered encounter medications on file  as of 04/07/2016.    ALLERGIES: Allergies  Allergen Reactions  . Biaxin [Clarithromycin] Anaphylaxis and Shortness Of Breath  . Peanut-Containing Drug Products Anaphylaxis   VACCINATION STATUS:  There is no immunization history on file for this patient.  Diabetes  He presents for his follow-up diabetic visit. He has type 2 diabetes mellitus. Onset time: He was diagnosed at approximate age of 39 years. His disease course has been worsening. There are no hypoglycemic associated symptoms. Pertinent negatives for hypoglycemia include no confusion, headaches, pallor or seizures. Associated symptoms include polydipsia and polyuria. Pertinent negatives for diabetes include no chest pain, no fatigue, no polyphagia and no weakness. There are  no hypoglycemic complications. Symptoms are worsening. Diabetic complications include PVD. Risk factors for coronary artery disease include dyslipidemia, diabetes mellitus, family history, male sex, obesity and sedentary lifestyle. Current diabetic treatment includes intensive insulin program. He is compliant with treatment most of the time. His weight is increasing rapidly. He is following a generally unhealthy diet. He has had a previous visit with a dietitian. He never participates in exercise. His home blood glucose trend is increasing steadily. His breakfast blood glucose range is generally >200 mg/dl. His lunch blood glucose range is generally >200 mg/dl. His dinner blood glucose range is generally >200 mg/dl. His overall blood glucose range is >200 mg/dl. An ACE inhibitor/angiotensin II receptor blocker is being taken.  Hyperlipidemia  This is a chronic problem. The current episode started more than 1 year ago. The problem is uncontrolled. Exacerbating diseases include diabetes and obesity. Pertinent negatives include no chest pain, myalgias or shortness of breath. Current antihyperlipidemic treatment includes statins. Risk factors for coronary artery disease include diabetes mellitus, dyslipidemia, hypertension, male sex, obesity and a sedentary lifestyle.  Hypertension  This is a chronic problem. The current episode started more than 1 year ago. The problem is uncontrolled. Pertinent negatives include no chest pain, headaches, neck pain, palpitations or shortness of breath. Past treatments include ACE inhibitors and calcium channel blockers. The current treatment provides no improvement. Compliance problems include diet.  Hypertensive end-organ damage includes PVD.     Review of Systems  Constitutional: Negative for fatigue and unexpected weight change.  HENT: Negative for dental problem, mouth sores and trouble swallowing.   Eyes: Negative for visual disturbance.  Respiratory: Negative for  cough, choking, chest tightness, shortness of breath and wheezing.   Cardiovascular: Negative for chest pain, palpitations and leg swelling.  Gastrointestinal: Negative for abdominal distention, abdominal pain, constipation, diarrhea, nausea and vomiting.  Endocrine: Positive for polydipsia and polyuria. Negative for polyphagia.  Genitourinary: Negative for dysuria, flank pain, hematuria and urgency.  Musculoskeletal: Negative for back pain, gait problem, myalgias and neck pain.  Skin: Negative for pallor, rash and wound.  Neurological: Negative for seizures, syncope, weakness, numbness and headaches.  Psychiatric/Behavioral: Negative.  Negative for confusion and dysphoric mood.    Objective:    BP (!) 153/78   Pulse 73   Ht 6' (1.829 m)   Wt (!) 427 lb (193.7 kg)   BMI 57.91 kg/m   Wt Readings from Last 3 Encounters:  04/07/16 (!) 427 lb (193.7 kg)  01/01/16 (!) 418 lb (189.6 kg)  10/01/15 (!) 418 lb (189.6 kg)    Physical Exam  Constitutional: He is oriented to person, place, and time. He appears well-developed and well-nourished. He is cooperative. No distress.  HENT:  Head: Normocephalic and atraumatic.  Eyes: EOM are normal.  Neck: Normal range of motion. Neck supple. No tracheal deviation  present. No thyromegaly present.  Cardiovascular: Normal rate, S1 normal, S2 normal and normal heart sounds.  Exam reveals no gallop.   No murmur heard. Pulses:      Dorsalis pedis pulses are 0 on the right side, and 0 on the left side.       Posterior tibial pulses are 0 on the right side, and 0 on the left side.  Pulmonary/Chest: Breath sounds normal. No respiratory distress. He has no wheezes.  Abdominal: Soft. Bowel sounds are normal. He exhibits no distension. There is no tenderness. There is no guarding and no CVA tenderness.  Musculoskeletal: He exhibits no edema.       Right shoulder: He exhibits no swelling and no deformity.  Neurological: He is alert and oriented to person,  place, and time. He has normal strength and normal reflexes. No cranial nerve deficit or sensory deficit. Gait normal.  Skin: Skin is warm and dry. No rash noted. No cyanosis. Nails show no clubbing.  Psychiatric: He has a normal mood and affect. His speech is normal and behavior is normal. Judgment and thought content normal. Cognition and memory are normal.    Results for orders placed or performed in visit on 03/25/16  Comprehensive metabolic panel  Result Value Ref Range   Sodium 137 135 - 146 mmol/L   Potassium 4.5 3.5 - 5.3 mmol/L   Chloride 102 98 - 110 mmol/L   CO2 27 20 - 31 mmol/L   Glucose, Bld 254 (H) 65 - 99 mg/dL   BUN 14 7 - 25 mg/dL   Creat 6.21 3.08 - 6.57 mg/dL   Total Bilirubin 0.5 0.2 - 1.2 mg/dL   Alkaline Phosphatase 75 40 - 115 U/L   AST 37 (H) 10 - 35 U/L   ALT 34 9 - 46 U/L   Total Protein 6.4 6.1 - 8.1 g/dL   Albumin 3.9 3.6 - 5.1 g/dL   Calcium 9.4 8.6 - 84.6 mg/dL  Hemoglobin N6E  Result Value Ref Range   Hgb A1c MFr Bld 9.4 (H) <5.7 %   Mean Plasma Glucose 223 mg/dL   Diabetic Labs (most recent): Lab Results  Component Value Date   HGBA1C 9.4 (H) 03/25/2016   HGBA1C 8.8 (H) 12/23/2015   HGBA1C 9.1 (H) 09/24/2015   Lipid Panel     Component Value Date/Time   CHOL 172 03/12/2015 1200   TRIG 155 (H) 03/12/2015 1200   HDL 35 (L) 03/12/2015 1200   CHOLHDL 4.9 03/12/2015 1200   VLDL 31 (H) 03/12/2015 1200   LDLCALC 106 03/12/2015 1200     Assessment & Plan:   1. Uncontrolled type 2 diabetes mellitus with diabetic peripheral angiopathy without gangrene, with long-term current use of insulin (HCC) - patient remains at a high risk for more acute and chronic complications of diabetes which include CAD, CVA, CKD, retinopathy, and neuropathy. These are all discussed in detail with the patient.  Patient came with better than before but still above target glucose profile, and  recent A1c of 9.4% increasing from  8.8% .  Glucose logs and insulin  administration records pertaining to this visit,  to be scanned into patient's records.  Recent labs reviewed.   - I have re-counseled the patient on diet management and weight loss  by adopting a carbohydrate restricted / protein rich  Diet.  - Suggestion is made for patient to avoid simple carbohydrates   from their diet including Cakes , Desserts, Ice Cream,  Soda (  diet  and regular) , Sweet Tea , Candies,  Chips, Cookies, Artificial Sweeteners,   and "Sugar-free" Products .  This will help patient to have stable blood glucose profile and potentially avoid unintended  Weight gain.  - Patient is advised to stick to a routine mealtimes to eat 3 meals  a day and avoid unnecessary snacks ( to snack only to correct hypoglycemia).  - The patient  has been  scheduled with Norm Salt, RDN, CDE for individualized DM education.  - I have approached patient with the following individualized plan to manage diabetes and patient agrees. - If he continues to require increasingly larger dose of insulin, he would be considered for U500 by next visit. - In the meantime, I advised him to continue Lantus  100 units qhs, increase NovoLog to 30 units 3 times a day before meals plus correction dose , with strict monitoring of BG AC and HS .   -Patient is encouraged to call clinic for blood glucose levels less than 70 or above 300 mg /dl. -I will continue Metformin 1gm PO BID and continue Tanzeum 50 milligrams weekly. - He is a perfect candidate for bariatric surgery, for the first time he is open to considering that. I gave him a brochure and contact number to call and initiate the process.  -He is following with Norm Salt, CDE for DM education. - He did not tolerate  Invokana , was d/ced.  - Patient specific target  for A1c; LDL, HDL, Triglycerides, and  Waist Circumference were discussed in detail.  2) BP/HTN: Uncontrolled. Continue current medications including ACEI/ARB. 3) Lipids/HPL:  continue  statins. 4)  Weight/Diet:  He is gaining weight rapidly, CDE consult in progress, exercise, and carbohydrates information provided.  5) Chronic Care/Health Maintenance:  -Patient  Is  on ACEI/ARB and Statin medications and encouraged to continue to follow up with Ophthalmology, Podiatrist at least yearly or according to recommendations, and advised to  stay away from smoking. I have recommended yearly flu vaccine and pneumonia vaccination at least every 5 years; moderate intensity exercise for up to 150 minutes weekly; and  sleep for at least 7 hours a day.  - 25 minutes of time was spent on the care of this patient , 50% of which was applied for counseling on diabetes complications and their preventions.  - I advised patient to maintain close follow up with HAWKINS,EDWARD L, MD for primary care needs.  Patient is asked to bring meter and  blood glucose logs during their next visit.   Follow up plan: -Return in about 3 months (around 07/07/2016) for meter, and logs.  Marquis Lunch, MD Phone: [phone]  Fax: [phone]   04/07/2016, 9:27 AM

## 2016-04-18 ENCOUNTER — Other Ambulatory Visit: Payer: Self-pay | Admitting: "Endocrinology

## 2016-04-28 ENCOUNTER — Other Ambulatory Visit: Payer: Self-pay | Admitting: "Endocrinology

## 2016-05-31 ENCOUNTER — Other Ambulatory Visit: Payer: Self-pay | Admitting: "Endocrinology

## 2016-06-15 ENCOUNTER — Other Ambulatory Visit: Payer: Self-pay | Admitting: "Endocrinology

## 2016-06-15 MED ORDER — EXENATIDE ER 2 MG/0.85ML ~~LOC~~ AUIJ: 2.0000 mg | AUTO-INJECTOR | SUBCUTANEOUS | 2 refills | Status: DC

## 2016-07-03 ENCOUNTER — Other Ambulatory Visit: Payer: Self-pay | Admitting: "Endocrinology

## 2016-07-13 ENCOUNTER — Other Ambulatory Visit: Payer: Self-pay | Admitting: "Endocrinology

## 2016-07-13 LAB — COMPREHENSIVE METABOLIC PANEL
ALT: 28 U/L (ref 9–46)
AST: 27 U/L (ref 10–35)
Albumin: 3.9 g/dL (ref 3.6–5.1)
Alkaline Phosphatase: 77 U/L (ref 40–115)
BUN: 11 mg/dL (ref 7–25)
CALCIUM: 9.6 mg/dL (ref 8.6–10.3)
CO2: 23 mmol/L (ref 20–31)
Chloride: 103 mmol/L (ref 98–110)
Creat: 0.69 mg/dL — ABNORMAL LOW (ref 0.70–1.33)
GLUCOSE: 145 mg/dL — AB (ref 65–99)
Potassium: 4.7 mmol/L (ref 3.5–5.3)
Sodium: 138 mmol/L (ref 135–146)
Total Bilirubin: 0.4 mg/dL (ref 0.2–1.2)
Total Protein: 6.6 g/dL (ref 6.1–8.1)

## 2016-07-14 LAB — HEMOGLOBIN A1C
Hgb A1c MFr Bld: 8.6 % — ABNORMAL HIGH (ref <5.7)
Mean Plasma Glucose: 200 mg/dL

## 2016-07-20 ENCOUNTER — Ambulatory Visit (INDEPENDENT_AMBULATORY_CARE_PROVIDER_SITE_OTHER): Payer: Medicaid Other | Admitting: "Endocrinology

## 2016-07-20 ENCOUNTER — Encounter: Payer: Self-pay | Admitting: "Endocrinology

## 2016-07-20 VITALS — BP 131/75 | HR 69 | Ht 72.0 in | Wt >= 6400 oz

## 2016-07-20 DIAGNOSIS — E1165 Type 2 diabetes mellitus with hyperglycemia: Secondary | ICD-10-CM | POA: Diagnosis not present

## 2016-07-20 DIAGNOSIS — Z794 Long term (current) use of insulin: Secondary | ICD-10-CM | POA: Diagnosis not present

## 2016-07-20 DIAGNOSIS — E782 Mixed hyperlipidemia: Secondary | ICD-10-CM | POA: Diagnosis not present

## 2016-07-20 DIAGNOSIS — E1151 Type 2 diabetes mellitus with diabetic peripheral angiopathy without gangrene: Secondary | ICD-10-CM | POA: Diagnosis not present

## 2016-07-20 DIAGNOSIS — IMO0002 Reserved for concepts with insufficient information to code with codable children: Secondary | ICD-10-CM

## 2016-07-20 DIAGNOSIS — I10 Essential (primary) hypertension: Secondary | ICD-10-CM | POA: Diagnosis not present

## 2016-07-20 NOTE — Patient Instructions (Signed)

## 2016-07-20 NOTE — Progress Notes (Signed)
Subjective:    Patient ID: Aaron Potter, male    DOB: Nov 04, 1960, PCP Kari Baars, MD   Past Medical History:  Diagnosis Date  . Asthma   . Balanitis   . Cellulitis   . COPD (chronic obstructive pulmonary disease) (HCC)   . Diabetes mellitus without complication (HCC)   . Gout   . Hypertension   . Lichen sclerosus   . Obesity, morbid (more than 100 lbs over ideal weight or BMI > 40) (HCC)   . Sleep apnea   . Urticaria    Past Surgical History:  Procedure Laterality Date  . CHOLECYSTECTOMY     Social History   Social History  . Marital status: Single    Spouse name: N/A  . Number of children: N/A  . Years of education: N/A   Social History Main Topics  . Smoking status: Never Smoker  . Smokeless tobacco: Never Used  . Alcohol use No  . Drug use: No  . Sexual activity: Yes   Other Topics Concern  . None   Social History Narrative  . None   Outpatient Encounter Prescriptions as of 07/20/2016  Medication Sig  . ACCU-CHEK AVIVA PLUS test strip USE TO CHECK BLOOD SUGAR FOUR TIMES A DAY.  Marland Kitchen albuterol (PROAIR HFA) 108 (90 BASE) MCG/ACT inhaler Inhale 2 puffs into the lungs every 4 (four) hours as needed for wheezing or shortness of breath. Sometimes only uses one puff  . albuterol (PROVENTIL HFA;VENTOLIN HFA) 108 (90 BASE) MCG/ACT inhaler Inhale 2 puffs into the lungs 2 (two) times daily as needed for wheezing or shortness of breath.  . allopurinol (ZYLOPRIM) 300 MG tablet Take 300 mg by mouth daily.    Marland Kitchen ALPRAZolam (XANAX) 1 MG tablet Take 1 mg by mouth 4 (four) times daily.    . benazepril (LOTENSIN) 5 MG tablet TAKE 1 TABLET ONCE DAILY.  . diphenhydrAMINE (BENADRYL) 25 mg capsule Take 50 mg by mouth every 6 (six) hours as needed for itching.  Marland Kitchen GLOBAL EASE INJECT PEN NEEDLES 31G X 8 MM MISC USE 5 TIMES DAILY.  Marland Kitchen HYDROcodone-acetaminophen (NORCO) 10-325 MG per tablet Take 1 tablet by mouth 4 (four) times daily.  . indomethacin (INDOCIN) 50 MG capsule Take 50  mg by mouth daily.   Marland Kitchen LANTUS SOLOSTAR 100 UNIT/ML Solostar Pen INJECT 90 UNITS SUBCUTANEOUSLY INTO THE SKIN AT BEDTIME.  . metFORMIN (GLUCOPHAGE) 500 MG tablet Take 1,000 mg by mouth 2 (two) times daily with a meal.   . Multiple Vitamin (MULTIVITAMIN WITH MINERALS) TABS tablet Take 1 tablet by mouth daily.  Marland Kitchen NOVOLOG FLEXPEN 100 UNIT/ML FlexPen INJECTB 25-31 UNITS INTO THE SKIN 3 TIMES A DAY WITH MEALS.  Marland Kitchen pravastatin (PRAVACHOL) 40 MG tablet Take 40 mg by mouth daily.  Marland Kitchen TANZEUM 50 MG PEN INJECT 50 MG SUB-Q ONCE A WEEK.  . tiotropium (SPIRIVA) 18 MCG inhalation capsule Place 18 mcg into inhaler and inhale daily.  . verapamil (CALAN-SR) 240 MG CR tablet Take 240 mg by mouth daily.  . Vitamin D, Ergocalciferol, (DRISDOL) 50000 units CAPS capsule TAKE 1 CAPSULE WEEKLY.  . zolpidem (AMBIEN) 10 MG tablet Take 10 mg by mouth at bedtime as needed for sleep.   . [DISCONTINUED] Exenatide ER (BYDUREON BCISE) 2 MG/0.85ML AUIJ Inject 2 mg into the skin once a week.   No facility-administered encounter medications on file as of 07/20/2016.    ALLERGIES: Allergies  Allergen Reactions  . Biaxin [Clarithromycin] Anaphylaxis and Shortness Of Breath  .  Peanut-Containing Drug Products Anaphylaxis   VACCINATION STATUS:  There is no immunization history on file for this patient.  Diabetes  He presents for his follow-up diabetic visit. He has type 2 diabetes mellitus. Onset time: He was diagnosed at approximate age of 56 years. His disease course has been improving. There are no hypoglycemic associated symptoms. Pertinent negatives for hypoglycemia include no confusion, headaches, pallor or seizures. Associated symptoms include polydipsia and polyuria. Pertinent negatives for diabetes include no chest pain, no fatigue, no polyphagia and no weakness. There are no hypoglycemic complications. Symptoms are improving. Diabetic complications include PVD. Risk factors for coronary artery disease include dyslipidemia,  diabetes mellitus, family history, male sex, obesity and sedentary lifestyle. Current diabetic treatment includes intensive insulin program. He is compliant with treatment most of the time. His weight is fluctuating dramatically. He is following a generally unhealthy diet. He has had a previous visit with a dietitian. He never participates in exercise. His home blood glucose trend is increasing steadily. His breakfast blood glucose range is generally 180-200 mg/dl. His lunch blood glucose range is generally 180-200 mg/dl. His dinner blood glucose range is generally 180-200 mg/dl. His overall blood glucose range is 180-200 mg/dl. An ACE inhibitor/angiotensin II receptor blocker is being taken.  Hyperlipidemia  This is a chronic problem. The current episode started more than 1 year ago. The problem is uncontrolled. Exacerbating diseases include diabetes and obesity. Pertinent negatives include no chest pain, myalgias or shortness of breath. Current antihyperlipidemic treatment includes statins. Risk factors for coronary artery disease include diabetes mellitus, dyslipidemia, hypertension, male sex, obesity and a sedentary lifestyle.  Hypertension  This is a chronic problem. The current episode started more than 1 year ago. The problem is uncontrolled. Pertinent negatives include no chest pain, headaches, neck pain, palpitations or shortness of breath. Past treatments include ACE inhibitors and calcium channel blockers. The current treatment provides no improvement. Compliance problems include diet.  Hypertensive end-organ damage includes PVD.     Review of Systems  Constitutional: Negative for fatigue and unexpected weight change.  HENT: Negative for dental problem, mouth sores and trouble swallowing.   Eyes: Negative for visual disturbance.  Respiratory: Negative for cough, choking, chest tightness, shortness of breath and wheezing.   Cardiovascular: Negative for chest pain, palpitations and leg  swelling.  Gastrointestinal: Negative for abdominal distention, abdominal pain, constipation, diarrhea, nausea and vomiting.  Endocrine: Positive for polydipsia and polyuria. Negative for polyphagia.  Genitourinary: Negative for dysuria, flank pain, hematuria and urgency.  Musculoskeletal: Negative for back pain, gait problem, myalgias and neck pain.  Skin: Negative for pallor, rash and wound.  Neurological: Negative for seizures, syncope, weakness, numbness and headaches.  Psychiatric/Behavioral: Negative.  Negative for confusion and dysphoric mood.    Objective:    BP 131/75   Pulse 69   Ht 6' (1.829 m)   Wt (!) 413 lb (187.3 kg)   BMI 56.01 kg/m   Wt Readings from Last 3 Encounters:  07/20/16 (!) 413 lb (187.3 kg)  04/07/16 (!) 427 lb (193.7 kg)  01/01/16 (!) 418 lb (189.6 kg)    Physical Exam  Constitutional: He is oriented to person, place, and time. He appears well-developed and well-nourished. He is cooperative. No distress.  HENT:  Head: Normocephalic and atraumatic.  Eyes: EOM are normal.  Neck: Normal range of motion. Neck supple. No tracheal deviation present. No thyromegaly present.  Cardiovascular: Normal rate, S1 normal, S2 normal and normal heart sounds.  Exam reveals no gallop.  No murmur heard. Pulses:      Dorsalis pedis pulses are 0 on the right side, and 0 on the left side.       Posterior tibial pulses are 0 on the right side, and 0 on the left side.  Pulmonary/Chest: Breath sounds normal. No respiratory distress. He has no wheezes.  Abdominal: Soft. Bowel sounds are normal. He exhibits no distension. There is no tenderness. There is no guarding and no CVA tenderness.  Musculoskeletal: He exhibits no edema.       Right shoulder: He exhibits no swelling and no deformity.  Neurological: He is alert and oriented to person, place, and time. He has normal strength and normal reflexes. No cranial nerve deficit or sensory deficit. Gait normal.  Skin: Skin is warm  and dry. No rash noted. No cyanosis. Nails show no clubbing.  Psychiatric: He has a normal mood and affect. His speech is normal and behavior is normal. Judgment and thought content normal. Cognition and memory are normal.    Results for orders placed or performed in visit on 07/13/16  Comprehensive metabolic panel  Result Value Ref Range   Sodium 138 135 - 146 mmol/L   Potassium 4.7 3.5 - 5.3 mmol/L   Chloride 103 98 - 110 mmol/L   CO2 23 20 - 31 mmol/L   Glucose, Bld 145 (H) 65 - 99 mg/dL   BUN 11 7 - 25 mg/dL   Creat 1.61 (L) 0.96 - 1.33 mg/dL   Total Bilirubin 0.4 0.2 - 1.2 mg/dL   Alkaline Phosphatase 77 40 - 115 U/L   AST 27 10 - 35 U/L   ALT 28 9 - 46 U/L   Total Protein 6.6 6.1 - 8.1 g/dL   Albumin 3.9 3.6 - 5.1 g/dL   Calcium 9.6 8.6 - 04.5 mg/dL  Hemoglobin W0J  Result Value Ref Range   Hgb A1c MFr Bld 8.6 (H) <5.7 %   Mean Plasma Glucose 200 mg/dL   Diabetic Labs (most recent): Lab Results  Component Value Date   HGBA1C 8.6 (H) 07/13/2016   HGBA1C 9.4 (H) 03/25/2016   HGBA1C 8.8 (H) 12/23/2015   Lipid Panel     Component Value Date/Time   CHOL 172 03/12/2015 1200   TRIG 155 (H) 03/12/2015 1200   HDL 35 (L) 03/12/2015 1200   CHOLHDL 4.9 03/12/2015 1200   VLDL 31 (H) 03/12/2015 1200   LDLCALC 106 03/12/2015 1200     Assessment & Plan:   1. Uncontrolled type 2 diabetes mellitus with diabetic peripheral angiopathy without gangrene, with long-term current use of insulin (HCC) - patient remains at a high risk for more acute and chronic complications of diabetes which include CAD, CVA, CKD, retinopathy, and neuropathy. These are all discussed in detail with the patient.  Patient came with better than before but still above target glucose profile, and  recent A1c of 8.6% improving from 9.4%.   Glucose logs and insulin administration records pertaining to this visit,  to be scanned into patient's records.  Recent labs reviewed.   - I have re-counseled the  patient on diet management and weight loss  by adopting a carbohydrate restricted / protein rich  Diet.  - Suggestion is made for patient to avoid simple carbohydrates   from his diet including Cakes , Desserts, Ice Cream,  Soda (  diet and regular) , Sweet Tea , Candies,  Chips, Cookies, Artificial Sweeteners,   and "Sugar-free" Products .  This will help patient to  have stable blood glucose profile and potentially avoid unintended  Weight gain.  - Patient is advised to stick to a routine mealtimes to eat 3 meals  a day and avoid unnecessary snacks ( to snack only to correct hypoglycemia).  - The patient  has been  scheduled with Norm Salt, RDN, CDE for individualized DM education.  - I have approached patient with the following individualized plan to manage diabetes and patient agrees.  - In the meantime, I advised him to continue Lantus  100 units qhs,  NovoLog  30 units 3 times a day before meals plus correction dose , with strict monitoring of blood glucose 4 times a day-before missing at bedtime.    -Patient is encouraged to call clinic for blood glucose levels less than 70 or above 300 mg /dl. -I will continue Metformin 1gm PO BID and continue Tanzeum 50 milligrams weekly. - If he continues to require increasingly larger dose of insulin, he would be considered for U500 by next visit. - He is a perfect candidate for bariatric surgery, for the first time he is open to considering that. I gave him a brochure and contact number to call and initiate the process.  -He is following with Norm Salt, CDE for DM education. - He did not tolerate  Invokana , was d/ced.  - Patient specific target  for A1c; LDL, HDL, Triglycerides, and  Waist Circumference were discussed in detail.  2) BP/HTN: Uncontrolled. Continue current medications including ACEI/ARB. 3) Lipids/HPL:  continue statins. 4)  Weight/Diet:  He lost 14 pounds since last visit, however, his weight is significantly fluctuating,  CDE consult in progress, exercise, and carbohydrates information provided.  5) Chronic Care/Health Maintenance:  -Patient  Is  on ACEI/ARB and Statin medications and encouraged to continue to follow up with Ophthalmology, Podiatrist at least yearly or according to recommendations, and advised to  stay away from smoking. I have recommended yearly flu vaccine and pneumonia vaccination at least every 5 years; moderate intensity exercise for up to 150 minutes weekly; and  sleep for at least 7 hours a day.  - 25 minutes of time was spent on the care of this patient , 50% of which was applied for counseling on diabetes complications and their preventions.  - I advised patient to maintain close follow up with Kari Baars, MD for primary care needs.  Patient is asked to bring meter and  blood glucose logs during his next visit.   Follow up plan: -Return in about 3 months (around 10/20/2016) for meter, and logs, follow up with pre-visit labs, meter, and logs.  Marquis Lunch, MD Phone: [phone]  Fax: [phone]   07/20/2016, 8:54 AM

## 2016-07-27 ENCOUNTER — Other Ambulatory Visit: Payer: Self-pay | Admitting: "Endocrinology

## 2016-08-06 ENCOUNTER — Other Ambulatory Visit: Payer: Self-pay | Admitting: "Endocrinology

## 2016-08-19 ENCOUNTER — Other Ambulatory Visit: Payer: Self-pay | Admitting: "Endocrinology

## 2016-08-29 ENCOUNTER — Telehealth: Payer: Self-pay

## 2016-08-29 NOTE — Telephone Encounter (Signed)
PT notified

## 2016-08-29 NOTE — Telephone Encounter (Signed)
Pt states that the Tanzeum was switched to Bydureon he thinks due to insurance not paying. His Bg has been higher since he started Bydureon.  7/25  256  204  304  342 7/26  269  261  348  307 7/27  247

## 2016-08-29 NOTE — Telephone Encounter (Signed)
Continue Bydureon 2 mg weekly, continue Lantus 100 units daily at bedtime, increase NovoLog to 35 units 3 times a day before meals plus sliding scale. Call back if readings are higher than 3003.

## 2016-09-03 ENCOUNTER — Other Ambulatory Visit: Payer: Self-pay | Admitting: "Endocrinology

## 2016-09-06 ENCOUNTER — Telehealth: Payer: Self-pay | Admitting: "Endocrinology

## 2016-09-06 NOTE — Telephone Encounter (Signed)
Aaron Potter is stating his blood sugar is running high and the Kirke ShaggyByduran is not helping please advise?   09/04/16- am 171                  Lunch 281                  Dinner 287       Bedtime 259  09/05/16- am 266                  Lunch 213       Dinner 294        Bedtime 309  09/06/16- am 304                 Lunch 265

## 2016-09-07 NOTE — Telephone Encounter (Signed)
Continue Lantus 100 units daily at bedtime, increase NovoLog to 40 units 3 times a day before meals plus correction, continue Bydureon 2 mg weekly. She needs to come in this week with meter and logs. He will be considered for insulin U500.

## 2016-09-07 NOTE — Telephone Encounter (Signed)
Pt notified. He will call back to schedule appt.

## 2016-09-29 ENCOUNTER — Other Ambulatory Visit: Payer: Self-pay | Admitting: "Endocrinology

## 2016-10-18 LAB — LIPID PANEL
Cholesterol: 163 mg/dL (ref <200)
HDL: 45 mg/dL (ref >40)
LDL Cholesterol (Calc): 99 mg/dL (calc)
NON-HDL CHOLESTEROL (CALC): 118 mg/dL (ref <130)
Total CHOL/HDL Ratio: 3.6 (calc) (ref <5.0)
Triglycerides: 92 mg/dL (ref <150)

## 2016-10-18 LAB — RENAL FUNCTION PANEL
ALBUMIN MSPROF: 4.2 g/dL (ref 3.6–5.1)
BUN: 14 mg/dL (ref 7–25)
CHLORIDE: 99 mmol/L (ref 98–110)
CO2: 29 mmol/L (ref 20–32)
Calcium: 9.7 mg/dL (ref 8.6–10.3)
Creat: 0.7 mg/dL (ref 0.70–1.33)
GLUCOSE: 214 mg/dL — AB (ref 65–99)
Phosphorus: 4.2 mg/dL (ref 2.5–4.5)
Potassium: 4.4 mmol/L (ref 3.5–5.3)
Sodium: 138 mmol/L (ref 135–146)

## 2016-10-18 LAB — MICROALBUMIN / CREATININE URINE RATIO
CREATININE, URINE: 164 mg/dL (ref 20–320)
MICROALB UR: 5.3 mg/dL
MICROALB/CREAT RATIO: 32 ug/mg{creat} — AB (ref <30)

## 2016-10-18 LAB — TSH: TSH: 2.04 mIU/L (ref 0.40–4.50)

## 2016-10-18 LAB — T4, FREE: FREE T4: 1 ng/dL (ref 0.8–1.8)

## 2016-10-18 LAB — HEMOGLOBIN A1C
EAG (MMOL/L): 12 (calc)
Hgb A1c MFr Bld: 9.2 % of total Hgb — ABNORMAL HIGH (ref <5.7)
Mean Plasma Glucose: 217 (calc)

## 2016-10-21 ENCOUNTER — Ambulatory Visit (INDEPENDENT_AMBULATORY_CARE_PROVIDER_SITE_OTHER): Payer: Medicaid Other | Admitting: "Endocrinology

## 2016-10-21 ENCOUNTER — Encounter: Payer: Self-pay | Admitting: "Endocrinology

## 2016-10-21 VITALS — BP 163/80 | HR 77 | Ht 72.0 in | Wt >= 6400 oz

## 2016-10-21 DIAGNOSIS — E1151 Type 2 diabetes mellitus with diabetic peripheral angiopathy without gangrene: Secondary | ICD-10-CM

## 2016-10-21 DIAGNOSIS — E1165 Type 2 diabetes mellitus with hyperglycemia: Secondary | ICD-10-CM | POA: Diagnosis not present

## 2016-10-21 DIAGNOSIS — E782 Mixed hyperlipidemia: Secondary | ICD-10-CM

## 2016-10-21 DIAGNOSIS — Z794 Long term (current) use of insulin: Secondary | ICD-10-CM | POA: Diagnosis not present

## 2016-10-21 DIAGNOSIS — IMO0002 Reserved for concepts with insufficient information to code with codable children: Secondary | ICD-10-CM

## 2016-10-21 DIAGNOSIS — I10 Essential (primary) hypertension: Secondary | ICD-10-CM | POA: Diagnosis not present

## 2016-10-21 MED ORDER — INSULIN ASPART 100 UNIT/ML FLEXPEN: 40.0000 [IU] | PEN_INJECTOR | Freq: Three times a day (TID) | SUBCUTANEOUS | 2 refills | Status: DC

## 2016-10-21 NOTE — Patient Instructions (Signed)

## 2016-10-21 NOTE — Progress Notes (Signed)
Subjective:    Patient ID: Aaron Potter, male    DOB: 1960/11/22, PCP Kari Baars, MD   Past Medical History:  Diagnosis Date  . Asthma   . Balanitis   . Cellulitis   . COPD (chronic obstructive pulmonary disease) (HCC)   . Diabetes mellitus without complication (HCC)   . Gout   . Hypertension   . Lichen sclerosus   . Obesity, morbid (more than 100 lbs over ideal weight or BMI > 40) (HCC)   . Sleep apnea   . Urticaria    Past Surgical History:  Procedure Laterality Date  . CHOLECYSTECTOMY     Social History   Social History  . Marital status: Single    Spouse name: N/A  . Number of children: N/A  . Years of education: N/A   Social History Main Topics  . Smoking status: Never Smoker  . Smokeless tobacco: Never Used  . Alcohol use No  . Drug use: No  . Sexual activity: Yes   Other Topics Concern  . None   Social History Narrative  . None   Outpatient Encounter Prescriptions as of 10/21/2016  Medication Sig  . Exenatide ER 2 MG/0.85ML AUIJ Inject 2 mg into the skin once a week.  Marland Kitchen ACCU-CHEK AVIVA PLUS test strip USE TO CHECK BLOOD SUGAR FOUR TIMES A DAY.  Marland Kitchen albuterol (PROAIR HFA) 108 (90 BASE) MCG/ACT inhaler Inhale 2 puffs into the lungs every 4 (four) hours as needed for wheezing or shortness of breath. Sometimes only uses one puff  . albuterol (PROVENTIL HFA;VENTOLIN HFA) 108 (90 BASE) MCG/ACT inhaler Inhale 2 puffs into the lungs 2 (two) times daily as needed for wheezing or shortness of breath.  . allopurinol (ZYLOPRIM) 300 MG tablet Take 300 mg by mouth daily.    Marland Kitchen ALPRAZolam (XANAX) 1 MG tablet Take 1 mg by mouth 4 (four) times daily.    . benazepril (LOTENSIN) 5 MG tablet TAKE 1 TABLET ONCE DAILY.  . diphenhydrAMINE (BENADRYL) 25 mg capsule Take 50 mg by mouth every 6 (six) hours as needed for itching.  Marland Kitchen GLOBAL EASE INJECT PEN NEEDLES 31G X 8 MM MISC USE 5 TIMES DAILY.  Marland Kitchen HYDROcodone-acetaminophen (NORCO) 10-325 MG per tablet Take 1 tablet by  mouth 4 (four) times daily.  . indomethacin (INDOCIN) 50 MG capsule Take 50 mg by mouth daily.   . insulin aspart (NOVOLOG FLEXPEN) 100 UNIT/ML FlexPen Inject 40-46 Units into the skin 3 (three) times daily before meals.  Marland Kitchen LANTUS SOLOSTAR 100 UNIT/ML Solostar Pen INJECT 90 UNITS SUBQ INTO THE SKIN AT BEDTIME.  . metFORMIN (GLUCOPHAGE) 500 MG tablet Take 1,000 mg by mouth 2 (two) times daily with a meal.   . Multiple Vitamin (MULTIVITAMIN WITH MINERALS) TABS tablet Take 1 tablet by mouth daily.  . pravastatin (PRAVACHOL) 40 MG tablet Take 40 mg by mouth daily.  Marland Kitchen tiotropium (SPIRIVA) 18 MCG inhalation capsule Place 18 mcg into inhaler and inhale daily.  . verapamil (CALAN-SR) 240 MG CR tablet Take 240 mg by mouth daily.  . Vitamin D, Ergocalciferol, (DRISDOL) 50000 units CAPS capsule TAKE 1 CAPSULE WEEKLY.  . zolpidem (AMBIEN) 10 MG tablet Take 10 mg by mouth at bedtime as needed for sleep.   . [DISCONTINUED] benazepril (LOTENSIN) 5 MG tablet TAKE (1) TABLET BY MOUTH ONCE DAILY.  . [DISCONTINUED] NOVOLOG FLEXPEN 100 UNIT/ML FlexPen INJECT 25-31 UNITS THE SKIN 3 TIMES DAILY WITH MEALS.  . [DISCONTINUED] TANZEUM 50 MG PEN INJECT 50 MG  SUB-Q ONCE A WEEK.   No facility-administered encounter medications on file as of 10/21/2016.    ALLERGIES: Allergies  Allergen Reactions  . Biaxin [Clarithromycin] Anaphylaxis and Shortness Of Breath  . Peanut-Containing Drug Products Anaphylaxis   VACCINATION STATUS:  There is no immunization history on file for this patient.  Diabetes  He presents for his follow-up diabetic visit. He has type 2 diabetes mellitus. Onset time: He was diagnosed at approximate age of 24 years. His disease course has been worsening. There are no hypoglycemic associated symptoms. Pertinent negatives for hypoglycemia include no confusion, headaches, pallor or seizures. Associated symptoms include polydipsia and polyuria. Pertinent negatives for diabetes include no chest pain, no  fatigue, no polyphagia and no weakness. There are no hypoglycemic complications. Symptoms are worsening. Diabetic complications include PVD. Risk factors for coronary artery disease include dyslipidemia, diabetes mellitus, family history, male sex, obesity and sedentary lifestyle. Current diabetic treatment includes intensive insulin program. He is compliant with treatment most of the time. His weight is increasing steadily. He is following a generally unhealthy diet. He has had a previous visit with a dietitian. He never participates in exercise. His home blood glucose trend is increasing steadily. His breakfast blood glucose range is generally >200 mg/dl. His lunch blood glucose range is generally >200 mg/dl. His dinner blood glucose range is generally >200 mg/dl. His bedtime blood glucose range is generally >200 mg/dl. His overall blood glucose range is >200 mg/dl. An ACE inhibitor/angiotensin II receptor blocker is being taken.  Hyperlipidemia  This is a chronic problem. The current episode started more than 1 year ago. The problem is uncontrolled. Exacerbating diseases include diabetes and obesity. Pertinent negatives include no chest pain, myalgias or shortness of breath. Current antihyperlipidemic treatment includes statins. Risk factors for coronary artery disease include diabetes mellitus, dyslipidemia, hypertension, male sex, obesity and a sedentary lifestyle.  Hypertension  This is a chronic problem. The current episode started more than 1 year ago. The problem is uncontrolled. Pertinent negatives include no chest pain, headaches, neck pain, palpitations or shortness of breath. Past treatments include ACE inhibitors and calcium channel blockers. The current treatment provides no improvement. Compliance problems include diet.  Hypertensive end-organ damage includes PVD.     Review of Systems  Constitutional: Negative for fatigue and unexpected weight change.  HENT: Negative for dental problem,  mouth sores and trouble swallowing.   Eyes: Negative for visual disturbance.  Respiratory: Negative for cough, choking, chest tightness, shortness of breath and wheezing.   Cardiovascular: Negative for chest pain, palpitations and leg swelling.  Gastrointestinal: Negative for abdominal distention, abdominal pain, constipation, diarrhea, nausea and vomiting.  Endocrine: Positive for polydipsia and polyuria. Negative for polyphagia.  Genitourinary: Negative for dysuria, flank pain, hematuria and urgency.  Musculoskeletal: Negative for back pain, gait problem, myalgias and neck pain.  Skin: Negative for pallor, rash and wound.  Neurological: Negative for seizures, syncope, weakness, numbness and headaches.  Psychiatric/Behavioral: Negative.  Negative for confusion and dysphoric mood.    Objective:    BP (!) 163/80   Pulse 77   Ht 6' (1.829 m)   Wt (!) 426 lb (193.2 kg)   BMI 57.78 kg/m   Wt Readings from Last 3 Encounters:  10/21/16 (!) 426 lb (193.2 kg)  07/20/16 (!) 413 lb (187.3 kg)  04/07/16 (!) 427 lb (193.7 kg)    Physical Exam  Constitutional: He is oriented to person, place, and time. He appears well-developed and well-nourished. He is cooperative. No distress.  HENT:  Head: Normocephalic and atraumatic.  Eyes: EOM are normal.  Neck: Normal range of motion. Neck supple. No tracheal deviation present. No thyromegaly present.  Cardiovascular: Normal rate, S1 normal, S2 normal and normal heart sounds.  Exam reveals no gallop.   No murmur heard. Pulses:      Dorsalis pedis pulses are 0 on the right side, and 0 on the left side.       Posterior tibial pulses are 0 on the right side, and 0 on the left side.  Pulmonary/Chest: Breath sounds normal. No respiratory distress. He has no wheezes.  Abdominal: Soft. Bowel sounds are normal. He exhibits no distension. There is no tenderness. There is no guarding and no CVA tenderness.  Musculoskeletal: He exhibits no edema.       Right  shoulder: He exhibits no swelling and no deformity.  Neurological: He is alert and oriented to person, place, and time. He has normal strength and normal reflexes. No cranial nerve deficit or sensory deficit. Gait normal.  Skin: Skin is warm and dry. No rash noted. No cyanosis. Nails show no clubbing.  Psychiatric: He has a normal mood and affect. His speech is normal and behavior is normal. Judgment and thought content normal. Cognition and memory are normal.    Results for orders placed or performed in visit on 07/20/16  T4, free  Result Value Ref Range   Free T4 1.0 0.8 - 1.8 ng/dL  TSH  Result Value Ref Range   TSH 2.04 0.40 - 4.50 mIU/L  Renal function panel  Result Value Ref Range   Glucose, Bld 214 (H) 65 - 99 mg/dL   BUN 14 7 - 25 mg/dL   Creat 8.11 9.14 - 7.82 mg/dL   BUN/Creatinine Ratio NOT APPLICABLE 6 - 22 (calc)   Sodium 138 135 - 146 mmol/L   Potassium 4.4 3.5 - 5.3 mmol/L   Chloride 99 98 - 110 mmol/L   CO2 29 20 - 32 mmol/L   Calcium 9.7 8.6 - 10.3 mg/dL   Phosphorus 4.2 2.5 - 4.5 mg/dL   Albumin 4.2 3.6 - 5.1 g/dL  Hemoglobin N5A  Result Value Ref Range   Hgb A1c MFr Bld 9.2 (H) <5.7 % of total Hgb   Mean Plasma Glucose 217 (calc)   eAG (mmol/L) 12.0 (calc)  Microalbumin / creatinine urine ratio  Result Value Ref Range   Creatinine, Urine 164 20 - 320 mg/dL   Microalb, Ur 5.3 mg/dL   Microalb Creat Ratio 32 (H) <30 mcg/mg creat  Lipid panel  Result Value Ref Range   Cholesterol 163 <200 mg/dL   HDL 45 >21 mg/dL   Triglycerides 92 <308 mg/dL   LDL Cholesterol (Calc) 99 mg/dL (calc)   Total CHOL/HDL Ratio 3.6 <5.0 (calc)   Non-HDL Cholesterol (Calc) 118 <130 mg/dL (calc)   Diabetic Labs (most recent): Lab Results  Component Value Date   HGBA1C 9.2 (H) 10/15/2016   HGBA1C 8.6 (H) 07/13/2016   HGBA1C 9.4 (H) 03/25/2016   Lipid Panel     Component Value Date/Time   CHOL 163 10/15/2016 0933   TRIG 92 10/15/2016 0933   HDL 45 10/15/2016 0933    CHOLHDL 3.6 10/15/2016 0933   VLDL 31 (H) 03/12/2015 1200   LDLCALC 106 03/12/2015 1200     Assessment & Plan:   1. Uncontrolled type 2 diabetes mellitus with diabetic peripheral angiopathy without gangrene, with long-term current use of insulin (HCC) - He came with persistently above target blood glucose readings  mainly due to dietary indiscretions and due to withdrawal of Bydureon for unclear reasons. He remains at a high risk for more acute and chronic complications of diabetes which include CAD, CVA, CKD, retinopathy, and neuropathy. These are all discussed in detail with the patient.  Patient came with higher A1c of 9.2% from 8.6%.   Glucose logs and insulin administration records pertaining to this visit,  to be scanned into patient's records.  Recent labs reviewed.  - I have re-counseled the patient on diet management and weight loss  by adopting a carbohydrate restricted / protein rich  Diet.  -  Suggestion is made for him to avoid simple carbohydrates  from his diet including Cakes, Sweet Desserts / Pastries, Ice Cream, Soda (diet and regular), Sweet Tea, Candies, Chips, Cookies, Store Bought Juices, Alcohol in Excess of  1-2 drinks a day, Artificial Sweeteners, and "Sugar-free" Products. This will help patient to have stable blood glucose profile and potentially avoid unintended weight gain.   - Patient is advised to stick to a routine mealtimes to eat 3 meals  a day and avoid unnecessary snacks ( to snack only to correct hypoglycemia).   - I have approached patient with the following individualized plan to manage diabetes and patient agrees. - Due to his extreme insulin resistance and the fact that he is requiring more than 200 units of insulin a day, he would be considered for insulin Humulin R U500 after he finishes up his current supplies of insulin U100.  - In the meantime, I advised him to continue Lantus  100 units daily at bedtime,  increase NovoLog to 40 units 3 times a  day before meals plus correction dose , with strict monitoring of blood glucose 4 times a day-before meals and at bedtime.    -Patient is encouraged to call clinic for blood glucose levels less than 70 or above 300 mg /dl. -I will continue Metformin 1gm   by mouth twice a day and  resume Bydureon 2 mg subcutaneously  weekly.  - He is a perfect candidate for bariatric surgery, for the first time he is open to considering that. I gave him a brochure and contact number to call and initiate the process.  -He is following with Norm Salt, CDE for DM education.  - Patient specific target  for A1c; LDL, HDL, Triglycerides, and  Waist Circumference were discussed in detail.  2) BP/HTN: Uncontrolled. I advised him to continue current medications  including ACEI/ARB. 3) Lipids/HPL:  continue statins. 4)  Weight/Diet:  He is gaining weight , however, his weight is significantly fluctuating, CDE consult in progress, exercise, and carbohydrates information provided.  5) Chronic Care/Health Maintenance:  -Patient  Is  on ACEI/ARB and Statin medications and encouraged to continue to follow up with Ophthalmology, Podiatrist at least yearly or according to recommendations, and advised to  stay away from smoking. I have recommended yearly flu vaccine and pneumonia vaccination at least every 5 years; moderate intensity exercise for up to 150 minutes weekly; and  sleep for at least 7 hours a day.  - Time spent with the patient: 25 min, of which >50% was spent in reviewing his sugar logs , discussing his hypo- and hyper-glycemic episodes, reviewing his current and  previous labs and insulin doses and developing a plan to avoid hypo- and hyper-glycemia.    - I advised patient to maintain close follow up with Kari Baars, MD for primary care needs.t.   Follow up plan: -Return in about  3 months (around 01/21/2017) for meter, and logs.  Marquis LunchGebre Sheldon Sem, MD Phone: [phone]  Fax: [phone]  -  This note  was partially dictated with voice recognition software. Similar sounding words can be transcribed inadequately or may not  be corrected upon review.  10/21/2016, 12:48 PM

## 2016-10-24 ENCOUNTER — Other Ambulatory Visit: Payer: Self-pay | Admitting: "Endocrinology

## 2016-11-03 ENCOUNTER — Other Ambulatory Visit: Payer: Self-pay | Admitting: "Endocrinology

## 2016-11-23 ENCOUNTER — Other Ambulatory Visit: Payer: Self-pay | Admitting: "Endocrinology

## 2016-12-03 ENCOUNTER — Other Ambulatory Visit: Payer: Self-pay | Admitting: "Endocrinology

## 2016-12-21 ENCOUNTER — Other Ambulatory Visit: Payer: Self-pay | Admitting: "Endocrinology

## 2017-01-02 ENCOUNTER — Other Ambulatory Visit: Payer: Self-pay

## 2017-01-02 MED ORDER — INSULIN ASPART 100 UNIT/ML FLEXPEN: 40.0000 [IU] | PEN_INJECTOR | Freq: Three times a day (TID) | SUBCUTANEOUS | 2 refills | Status: DC

## 2017-01-19 LAB — COMPLETE METABOLIC PANEL WITH GFR
AG Ratio: 1.5 (calc) (ref 1.0–2.5)
ALBUMIN MSPROF: 4 g/dL (ref 3.6–5.1)
ALT: 25 U/L (ref 9–46)
AST: 28 U/L (ref 10–35)
Alkaline phosphatase (APISO): 73 U/L (ref 40–115)
BUN: 12 mg/dL (ref 7–25)
CALCIUM: 10 mg/dL (ref 8.6–10.3)
CHLORIDE: 99 mmol/L (ref 98–110)
CO2: 28 mmol/L (ref 20–32)
Creat: 0.77 mg/dL (ref 0.70–1.33)
GFR, EST AFRICAN AMERICAN: 119 mL/min/{1.73_m2} (ref >=60)
GFR, Est Non African American: 103 mL/min/{1.73_m2} (ref >=60)
GLUCOSE: 229 mg/dL — AB (ref 65–139)
Globulin: 2.7 g/dL (calc) (ref 1.9–3.7)
Potassium: 4.8 mmol/L (ref 3.5–5.3)
Sodium: 137 mmol/L (ref 135–146)
TOTAL PROTEIN: 6.7 g/dL (ref 6.1–8.1)
Total Bilirubin: 0.4 mg/dL (ref 0.2–1.2)

## 2017-01-19 LAB — HEMOGLOBIN A1C
HEMOGLOBIN A1C: 9.6 %{Hb} — AB (ref <5.7)
MEAN PLASMA GLUCOSE: 229 (calc)
eAG (mmol/L): 12.7 (calc)

## 2017-01-24 ENCOUNTER — Ambulatory Visit: Payer: Medicaid Other | Admitting: "Endocrinology

## 2017-01-26 ENCOUNTER — Emergency Department (HOSPITAL_COMMUNITY): Payer: Medicaid Other

## 2017-01-26 ENCOUNTER — Other Ambulatory Visit: Payer: Self-pay

## 2017-01-26 ENCOUNTER — Encounter (HOSPITAL_COMMUNITY): Payer: Self-pay | Admitting: Emergency Medicine

## 2017-01-26 ENCOUNTER — Emergency Department (HOSPITAL_COMMUNITY)
Admission: EM | Admit: 2017-01-26 | Discharge: 2017-01-26 | Disposition: A | Discharge status: Home / Self Care | Payer: Medicaid Other | Attending: Emergency Medicine | Admitting: Emergency Medicine

## 2017-01-26 DIAGNOSIS — J449 Chronic obstructive pulmonary disease, unspecified: Secondary | ICD-10-CM | POA: Insufficient documentation

## 2017-01-26 DIAGNOSIS — Z79899 Other long term (current) drug therapy: Secondary | ICD-10-CM | POA: Insufficient documentation

## 2017-01-26 DIAGNOSIS — Z7902 Long term (current) use of antithrombotics/antiplatelets: Secondary | ICD-10-CM | POA: Diagnosis not present

## 2017-01-26 DIAGNOSIS — J029 Acute pharyngitis, unspecified: Secondary | ICD-10-CM | POA: Diagnosis present

## 2017-01-26 DIAGNOSIS — Z794 Long term (current) use of insulin: Secondary | ICD-10-CM | POA: Insufficient documentation

## 2017-01-26 DIAGNOSIS — I10 Essential (primary) hypertension: Secondary | ICD-10-CM | POA: Insufficient documentation

## 2017-01-26 DIAGNOSIS — Z91010 Allergy to peanuts: Secondary | ICD-10-CM | POA: Diagnosis not present

## 2017-01-26 DIAGNOSIS — J45909 Unspecified asthma, uncomplicated: Secondary | ICD-10-CM | POA: Insufficient documentation

## 2017-01-26 DIAGNOSIS — E119 Type 2 diabetes mellitus without complications: Secondary | ICD-10-CM | POA: Diagnosis not present

## 2017-01-26 MED ORDER — IOPAMIDOL (ISOVUE-300) INJECTION 61%: 75.0000 mL | Freq: Once | INTRAVENOUS | Status: DC | PRN

## 2017-01-26 NOTE — ED Notes (Signed)
Per lab, upon entering room to draw blood, pt states his symptoms are better and he does not want blood work or CT, requests to see EDP, EDP notified

## 2017-01-26 NOTE — Discharge Instructions (Signed)
Continue your medications as previously prescribed. ° °Return to the emergency department if your symptoms significantly worsen or change. °

## 2017-01-26 NOTE — ED Provider Notes (Signed)
[facility] EMERGENCY DEPARTMENT Provider Note   CSN: [id number] Arrival date & time: 01/26/17  0200     History   Chief Complaint Chief Complaint  Patient presents with  . Sore Throat    HPI Aaron Potter is a 57 y.o. male.  Patient is a 57 year old male with history of morbid obesity, diabetes, COPD presenting for evaluation of severe throat and facial pain.  This is been ongoing for the past week.  He was seen by his primary doctor and prescribed Keflex and prednisone which she has been taking reliably for the past week.  He states that his symptoms are worsening and that he now has difficulty swallowing and sometimes breathing.  He denies any fevers or chills.   The history is provided by the patient.  Sore Throat  This is a new problem. Episode onset: 1 week ago. The problem occurs constantly. The problem has been gradually worsening. The symptoms are aggravated by swallowing. Nothing relieves the symptoms. Treatments tried: Keflex and prednisone. The treatment provided no relief.    Past Medical History:  Diagnosis Date  . Asthma   . Balanitis   . Cellulitis   . COPD (chronic obstructive pulmonary disease) (HCC)   . Diabetes mellitus without complication (HCC)   . Gout   . Hypertension   . Lichen sclerosus   . Obesity, morbid (more than 100 lbs over ideal weight or BMI > 40) (HCC)   . Sleep apnea   . Urticaria     Patient Active Problem List   Diagnosis Date Noted  . Hyperlipidemia 12/16/2014  . Lichen sclerosus of penis 04/12/2013  . COPD (chronic obstructive pulmonary disease) (HCC) 04/10/2013  . Obstructive sleep apnea 04/10/2013  . Essential hypertension, benign 04/07/2013  . Uncontrolled type 2 diabetes mellitus with diabetic peripheral angiopathy without gangrene, with long-term current use of insulin (HCC) 04/07/2013  . Morbid obesity (HCC) 04/07/2013  . Cellulitis 04/06/2013    Past Surgical History:  Procedure Laterality Date  . CHOLECYSTECTOMY          Home Medications    Prior to Admission medications   Medication Sig Start Date End Date Taking? Authorizing Provider  ACCU-CHEK AVIVA PLUS test strip USE TO CHECK BLOOD SUGAR FOUR TIMES A DAY. 02/23/16   Roma Kayser, MD  ACCU-CHEK AVIVA PLUS test strip USE TO CHECK BLOOD SUGAR 4 TIMES DAILY 10/24/16   Roma Kayser, MD  albuterol (PROAIR HFA) 108 (90 BASE) MCG/ACT inhaler Inhale 2 puffs into the lungs every 4 (four) hours as needed for wheezing or shortness of breath. Sometimes only uses one puff    [provider]  albuterol (PROVENTIL HFA;VENTOLIN HFA) 108 (90 BASE) MCG/ACT inhaler Inhale 2 puffs into the lungs 2 (two) times daily as needed for wheezing or shortness of breath.    [provider]  allopurinol (ZYLOPRIM) 300 MG tablet Take 300 mg by mouth daily.      [provider]  ALPRAZolam Prudy Feeler) 1 MG tablet Take 1 mg by mouth 4 (four) times daily.      [provider]  benazepril (LOTENSIN) 5 MG tablet TAKE 1 TABLET ONCE DAILY. 07/04/16   Roma Kayser, MD  BYDUREON BCISE 2 MG/0.85ML AUIJ INJECT 2MG  INTO THE SKIN ONCE A WEEK. 12/22/16   Roma Kayser, MD  diphenhydrAMINE (BENADRYL) 25 mg capsule Take 50 mg by mouth every 6 (six) hours as needed for itching.    [provider]  Exenatide ER 2 MG/0.85ML  AUIJ Inject 2 mg into the skin once a week.    [provider]  GLOBAL EASE INJECT PEN NEEDLES 31G X 8 MM MISC USE 5 TIMES DAILY. 09/30/16   Roma KayserNida, Gebreselassie W, MD  HYDROcodone-acetaminophen (NORCO) 10-325 MG per tablet Take 1 tablet by mouth 4 (four) times daily.    [provider]  indomethacin (INDOCIN) 50 MG capsule Take 50 mg by mouth daily.     [provider]  insulin aspart (NOVOLOG FLEXPEN) 100 UNIT/ML FlexPen Inject 40-46 Units into the skin 3 (three) times daily before meals. 01/02/17   Roma KayserNida, Gebreselassie W, MD  Insulin Glargine (LANTUS SOLOSTAR) 100 UNIT/ML Solostar  Pen Inject 100 Units into the skin at bedtime. 11/23/16   Roma KayserNida, Gebreselassie W, MD  Insulin Glargine (LANTUS SOLOSTAR) 100 UNIT/ML Solostar Pen Inject 100 Units into the skin at bedtime. 12/05/16   Roma KayserNida, Gebreselassie W, MD  metFORMIN (GLUCOPHAGE) 500 MG tablet Take 1,000 mg by mouth 2 (two) times daily with a meal.     [provider]  Multiple Vitamin (MULTIVITAMIN WITH MINERALS) TABS tablet Take 1 tablet by mouth daily.    [provider]  NOVOLOG FLEXPEN 100 UNIT/ML FlexPen INJECT 25-31 UNITS INTO THE SKIN 3 TIMES DAILY WITH MEALS 11/03/16   Nida, Denman GeorgeGebreselassie W, MD  pravastatin (PRAVACHOL) 40 MG tablet Take 40 mg by mouth daily.    [provider]  tiotropium (SPIRIVA) 18 MCG inhalation capsule Place 18 mcg into inhaler and inhale daily.    [provider]  verapamil (CALAN-SR) 240 MG CR tablet Take 240 mg by mouth daily.    [provider]  Vitamin D, Ergocalciferol, (DRISDOL) 50000 units CAPS capsule TAKE 1 CAPSULE WEEKLY. 11/23/16   Nida, Denman GeorgeGebreselassie W, MD  zolpidem (AMBIEN) 10 MG tablet Take 10 mg by mouth at bedtime as needed for sleep.     [provider]    Family History Family History  Problem Relation Age of Onset  . Urticaria Sister   . Diabetes Mother   . Diabetes Father     Social History Social History   Tobacco Use  . Smoking status: Never Smoker  . Smokeless tobacco: Never Used  Substance Use Topics  . Alcohol use: No    Alcohol/week: 0.0 oz  . Drug use: No     Allergies   Biaxin [clarithromycin] and Peanut-containing drug products   Review of Systems Review of Systems  All other systems reviewed and are negative.    Physical Exam Updated Vital Signs BP (!) 167/74 (BP Location: Right Arm)   Pulse 82   Temp 98.8 F (37.1 C) (Oral)   Resp (!) 22   Ht 5\' 11"  (1.803 m)   Wt (!) 195 kg (430 lb)   SpO2 96%   BMI 59.97 kg/m   Physical Exam  Constitutional: He is oriented to person, place,  and time. He appears well-developed and well-nourished. No distress.  HENT:  Head: Normocephalic and atraumatic.  Right Ear: Tympanic membrane and ear canal normal. No drainage.  Left Ear: Tympanic membrane and ear canal normal. No drainage.  Mouth/Throat: Mucous membranes are normal. No uvula swelling. Posterior oropharyngeal erythema present. No oropharyngeal exudate or posterior oropharyngeal edema. No tonsillar exudate.  There is pain with palpation of the left side of the face and neck.  Neck: Normal range of motion. Neck supple.  Cardiovascular: Normal rate and regular rhythm. Exam reveals no friction rub.  No murmur heard. Pulmonary/Chest: Effort normal and breath  sounds normal. No respiratory distress. He has no wheezes. He has no rales.  Abdominal: Soft. Bowel sounds are normal. He exhibits no distension. There is no tenderness.  Musculoskeletal: Normal range of motion. He exhibits no edema.  Lymphadenopathy:    He has no cervical adenopathy.  Neurological: He is alert and oriented to person, place, and time. Coordination normal.  Skin: Skin is warm and dry. He is not diaphoretic.  Nursing note and vitals reviewed.    ED Treatments / Results  Labs (all labs ordered are listed, but only abnormal results are displayed) Labs Reviewed - No data to display  EKG  EKG Interpretation None       Radiology No results found.  Procedures Procedures (including critical care time)  Medications Ordered in ED Medications - No data to display   Initial Impression / Assessment and Plan / ED Course  I have reviewed the triage vital signs and the nursing notes.  Pertinent labs & imaging results that were available during my care of the patient were reviewed by me and considered in my medical decision making (see chart for details).  Patient presents here with complaints of severe sore throat.  He has been on Keflex and prednisone for 1 week as prescribed by his primary doctor,  however he is not improving.  This morning he woke up with pain to the left side of his neck and face and stating that he had difficulty swallowing and breathing.  On exam, he was not stridorous and was in no respiratory distress.  He appeared to be handling his secretions without difficulty.  Due to the degree of perceived pain, my plan was to obtain a CT scan to rule out possibilities such as epiglottitis, retropharyngeal or peritonsillar abscess, or parotitis.  These tests were ordered and were due to be performed when the patient decided he was feeling better and declined to have these done.  I explained to him the importance of ruling out these previously described conditions, however he continues to state that he feels better and wants to go home.  He understands he can return at any time should he desire these tests.  Final Clinical Impressions(s) / ED Diagnoses   Final diagnoses:  None    ED Discharge Orders    None       Geoffery Lyons, MD 01/26/17 [phone]

## 2017-01-26 NOTE — ED Triage Notes (Signed)
Pt c/o sore throat, congestion x 2 weeks, now has pain to left side of face and ear x 4 days, states it sounds like "summertime crickets in my ear", pt has been seen by PCP and [facility], rcvd RX for Prednisone 10mg  and Cephalexin 500mg  that he began taking 01/18/17 with no relief, pt states negative strep result 2 nights ago

## 2017-02-07 ENCOUNTER — Encounter: Payer: Self-pay | Admitting: "Endocrinology

## 2017-02-07 ENCOUNTER — Ambulatory Visit: Payer: Medicaid Other | Admitting: "Endocrinology

## 2017-02-07 VITALS — BP 146/79 | HR 73 | Ht 72.0 in | Wt >= 6400 oz

## 2017-02-07 DIAGNOSIS — E1151 Type 2 diabetes mellitus with diabetic peripheral angiopathy without gangrene: Secondary | ICD-10-CM | POA: Diagnosis not present

## 2017-02-07 DIAGNOSIS — I10 Essential (primary) hypertension: Secondary | ICD-10-CM

## 2017-02-07 DIAGNOSIS — E782 Mixed hyperlipidemia: Secondary | ICD-10-CM

## 2017-02-07 DIAGNOSIS — E1165 Type 2 diabetes mellitus with hyperglycemia: Secondary | ICD-10-CM | POA: Diagnosis not present

## 2017-02-07 DIAGNOSIS — Z794 Long term (current) use of insulin: Secondary | ICD-10-CM | POA: Diagnosis not present

## 2017-02-07 DIAGNOSIS — IMO0002 Reserved for concepts with insufficient information to code with codable children: Secondary | ICD-10-CM

## 2017-02-07 MED ORDER — INSULIN REGULAR HUMAN (CONC) 500 UNIT/ML ~~LOC~~ SOPN: 60.0000 [IU] | PEN_INJECTOR | Freq: Three times a day (TID) | SUBCUTANEOUS | 2 refills | Status: DC

## 2017-02-07 NOTE — Patient Instructions (Signed)

## 2017-02-07 NOTE — Progress Notes (Signed)
Subjective:    Patient ID: Aaron Potter, male    DOB: 12/23/1960, PCP Kari Baars, MD   Past Medical History:  Diagnosis Date  . Asthma   . Balanitis   . Cellulitis   . COPD (chronic obstructive pulmonary disease) (HCC)   . Diabetes mellitus without complication (HCC)   . Gout   . Hypertension   . Lichen sclerosus   . Obesity, morbid (more than 100 lbs over ideal weight or BMI > 40) (HCC)   . Sleep apnea   . Urticaria    Past Surgical History:  Procedure Laterality Date  . CHOLECYSTECTOMY     Social History   Socioeconomic History  . Marital status: Single    Spouse name: None  . Number of children: None  . Years of education: None  . Highest education level: None  Social Needs  . Financial resource strain: None  . Food insecurity - worry: None  . Food insecurity - inability: None  . Transportation needs - medical: None  . Transportation needs - non-medical: None  Occupational History  . None  Tobacco Use  . Smoking status: Never Smoker  . Smokeless tobacco: Never Used  Substance and Sexual Activity  . Alcohol use: No    Alcohol/week: 0.0 oz  . Drug use: No  . Sexual activity: Yes  Other Topics Concern  . None  Social History Narrative  . None   Outpatient Encounter Medications as of 02/07/2017  Medication Sig  . ACCU-CHEK AVIVA PLUS test strip USE TO CHECK BLOOD SUGAR FOUR TIMES A DAY.  Marland Kitchen albuterol (PROAIR HFA) 108 (90 BASE) MCG/ACT inhaler Inhale 2 puffs into the lungs every 4 (four) hours as needed for wheezing or shortness of breath. Sometimes only uses one puff  . allopurinol (ZYLOPRIM) 300 MG tablet Take 300 mg by mouth daily.    Marland Kitchen ALPRAZolam (XANAX) 1 MG tablet Take 1 mg by mouth 4 (four) times daily.    . benazepril (LOTENSIN) 5 MG tablet TAKE 1 TABLET ONCE DAILY.  Marland Kitchen BYDUREON BCISE 2 MG/0.85ML AUIJ INJECT 2MG  INTO THE SKIN ONCE A WEEK.  . diphenhydrAMINE (BENADRYL) 25 mg capsule Take 50 mg by mouth every 6 (six) hours as needed for itching.   . Exenatide ER 2 MG/0.85ML AUIJ Inject 2 mg into the skin once a week.  Marland Kitchen GLOBAL EASE INJECT PEN NEEDLES 31G X 8 MM MISC USE 5 TIMES DAILY.  Marland Kitchen HYDROcodone-acetaminophen (NORCO) 10-325 MG per tablet Take 1 tablet by mouth 4 (four) times daily.  . indomethacin (INDOCIN) 50 MG capsule Take 50 mg by mouth daily.   . insulin regular human CONCENTRATED (HUMULIN R U-500 KWIKPEN) 500 UNIT/ML kwikpen Inject 60 Units into the skin 3 (three) times daily with meals.  . metFORMIN (GLUCOPHAGE) 500 MG tablet Take 1,000 mg by mouth 2 (two) times daily with a meal.   . Multiple Vitamin (MULTIVITAMIN WITH MINERALS) TABS tablet Take 1 tablet by mouth daily.  . pravastatin (PRAVACHOL) 40 MG tablet Take 40 mg by mouth daily.  Marland Kitchen tiotropium (SPIRIVA) 18 MCG inhalation capsule Place 18 mcg into inhaler and inhale daily.  . verapamil (CALAN-SR) 240 MG CR tablet Take 240 mg by mouth daily.  . Vitamin D, Ergocalciferol, (DRISDOL) 50000 units CAPS capsule TAKE 1 CAPSULE WEEKLY.  . zolpidem (AMBIEN) 10 MG tablet Take 10 mg by mouth at bedtime as needed for sleep.   . [DISCONTINUED] ACCU-CHEK AVIVA PLUS test strip USE TO CHECK BLOOD SUGAR 4 TIMES  DAILY  . [DISCONTINUED] albuterol (PROVENTIL HFA;VENTOLIN HFA) 108 (90 BASE) MCG/ACT inhaler Inhale 2 puffs into the lungs 2 (two) times daily as needed for wheezing or shortness of breath.  . [DISCONTINUED] insulin aspart (NOVOLOG FLEXPEN) 100 UNIT/ML FlexPen Inject 40-46 Units into the skin 3 (three) times daily before meals.  . [DISCONTINUED] Insulin Glargine (LANTUS SOLOSTAR) 100 UNIT/ML Solostar Pen Inject 100 Units into the skin at bedtime.  . [DISCONTINUED] Insulin Glargine (LANTUS SOLOSTAR) 100 UNIT/ML Solostar Pen Inject 100 Units into the skin at bedtime.  . [DISCONTINUED] NOVOLOG FLEXPEN 100 UNIT/ML FlexPen INJECT 25-31 UNITS INTO THE SKIN 3 TIMES DAILY WITH MEALS   No facility-administered encounter medications on file as of 02/07/2017.    ALLERGIES: Allergies   Allergen Reactions  . Biaxin [Clarithromycin] Anaphylaxis and Shortness Of Breath  . Peanut-Containing Drug Products Anaphylaxis   VACCINATION STATUS:  There is no immunization history on file for this patient.  Diabetes  He presents for his follow-up diabetic visit. He has type 2 diabetes mellitus. Onset time: He was diagnosed at approximate age of 12 years. His disease course has been worsening. There are no hypoglycemic associated symptoms. Pertinent negatives for hypoglycemia include no confusion, headaches, pallor or seizures. Associated symptoms include polydipsia and polyuria. Pertinent negatives for diabetes include no chest pain, no fatigue, no polyphagia and no weakness. There are no hypoglycemic complications. Symptoms are worsening. Diabetic complications include PVD. Risk factors for coronary artery disease include dyslipidemia, diabetes mellitus, family history, male sex, obesity and sedentary lifestyle. Current diabetic treatment includes intensive insulin program. He is compliant with treatment most of the time. His weight is increasing steadily. He is following a generally unhealthy diet. He has had a previous visit with a dietitian. He never participates in exercise. His home blood glucose trend is increasing steadily. His breakfast blood glucose range is generally >200 mg/dl. His lunch blood glucose range is generally >200 mg/dl. His dinner blood glucose range is generally >200 mg/dl. His bedtime blood glucose range is generally >200 mg/dl. His overall blood glucose range is >200 mg/dl. An ACE inhibitor/angiotensin II receptor blocker is being taken.  Hyperlipidemia  This is a chronic problem. The current episode started more than 1 year ago. The problem is uncontrolled. Exacerbating diseases include diabetes and obesity. Pertinent negatives include no chest pain, myalgias or shortness of breath. Current antihyperlipidemic treatment includes statins. Risk factors for coronary artery  disease include diabetes mellitus, dyslipidemia, hypertension, male sex, obesity and a sedentary lifestyle.  Hypertension  This is a chronic problem. The current episode started more than 1 year ago. The problem is uncontrolled. Pertinent negatives include no chest pain, headaches, neck pain, palpitations or shortness of breath. Past treatments include ACE inhibitors and calcium channel blockers. The current treatment provides no improvement. Compliance problems include diet.  Hypertensive end-organ damage includes PVD.     Review of Systems  Constitutional: Negative for fatigue and unexpected weight change.  HENT: Negative for dental problem, mouth sores and trouble swallowing.   Eyes: Negative for visual disturbance.  Respiratory: Negative for cough, choking, chest tightness, shortness of breath and wheezing.   Cardiovascular: Negative for chest pain, palpitations and leg swelling.  Gastrointestinal: Negative for abdominal distention, abdominal pain, constipation, diarrhea, nausea and vomiting.  Endocrine: Positive for polydipsia and polyuria. Negative for polyphagia.  Genitourinary: Negative for dysuria, flank pain, hematuria and urgency.  Musculoskeletal: Negative for back pain, gait problem, myalgias and neck pain.  Skin: Negative for pallor, rash and wound.  Neurological: Negative  for seizures, syncope, weakness, numbness and headaches.  Psychiatric/Behavioral: Negative.  Negative for confusion and dysphoric mood.    Objective:    BP (!) 146/79   Pulse 73   Ht 6' (1.829 m)   Wt (!) 435 lb (197.3 kg)   BMI 59.00 kg/m   Wt Readings from Last 3 Encounters:  02/07/17 (!) 435 lb (197.3 kg)  01/26/17 (!) 430 lb (195 kg)  10/21/16 (!) 426 lb (193.2 kg)    Physical Exam  Constitutional: He is oriented to person, place, and time. He appears well-developed and well-nourished. He is cooperative. No distress.  HENT:  Head: Normocephalic and atraumatic.  Eyes: EOM are normal.  Neck:  Normal range of motion. Neck supple. No tracheal deviation present. No thyromegaly present.  Cardiovascular: Normal rate, S1 normal, S2 normal and normal heart sounds. Exam reveals no gallop.  No murmur heard. Pulses:      Dorsalis pedis pulses are 0 on the right side, and 0 on the left side.       Posterior tibial pulses are 0 on the right side, and 0 on the left side.  Pulmonary/Chest: Breath sounds normal. No respiratory distress. He has no wheezes.  Abdominal: Soft. Bowel sounds are normal. He exhibits no distension. There is no tenderness. There is no guarding and no CVA tenderness.  Musculoskeletal: He exhibits no edema.       Right shoulder: He exhibits no swelling and no deformity.  Neurological: He is alert and oriented to person, place, and time. He has normal strength and normal reflexes. No cranial nerve deficit or sensory deficit. Gait normal.  Skin: Skin is warm and dry. No rash noted. No cyanosis. Nails show no clubbing.  Psychiatric: He has a normal mood and affect. His speech is normal and behavior is normal. Judgment and thought content normal. Cognition and memory are normal.    Results for orders placed or performed in visit on 10/21/16  COMPLETE METABOLIC PANEL WITH GFR  Result Value Ref Range   Glucose, Bld 229 (H) 65 - 139 mg/dL   BUN 12 7 - 25 mg/dL   Creat 4.09 8.11 - 9.14 mg/dL   GFR, Est Non African American 103 > OR = 60 mL/min/1.48m2   GFR, Est African American 119 > OR = 60 mL/min/1.27m2   BUN/Creatinine Ratio NOT APPLICABLE 6 - 22 (calc)   Sodium 137 135 - 146 mmol/L   Potassium 4.8 3.5 - 5.3 mmol/L   Chloride 99 98 - 110 mmol/L   CO2 28 20 - 32 mmol/L   Calcium 10.0 8.6 - 10.3 mg/dL   Total Protein 6.7 6.1 - 8.1 g/dL   Albumin 4.0 3.6 - 5.1 g/dL   Globulin 2.7 1.9 - 3.7 g/dL (calc)   AG Ratio 1.5 1.0 - 2.5 (calc)   Total Bilirubin 0.4 0.2 - 1.2 mg/dL   Alkaline phosphatase (APISO) 73 40 - 115 U/L   AST 28 10 - 35 U/L   ALT 25 9 - 46 U/L  Hemoglobin  A1c  Result Value Ref Range   Hgb A1c MFr Bld 9.6 (H) <5.7 % of total Hgb   Mean Plasma Glucose 229 (calc)   eAG (mmol/L) 12.7 (calc)   Diabetic Labs (most recent): Lab Results  Component Value Date   HGBA1C 9.6 (H) 01/18/2017   HGBA1C 9.2 (H) 10/15/2016   HGBA1C 8.6 (H) 07/13/2016   Lipid Panel     Component Value Date/Time   CHOL 163 10/15/2016 0933  TRIG 92 10/15/2016 0933   HDL 45 10/15/2016 0933   CHOLHDL 3.6 10/15/2016 0933   VLDL 31 (H) 03/12/2015 1200   LDLCALC 106 03/12/2015 1200     Assessment & Plan:   1. Uncontrolled type 2 diabetes mellitus with diabetic peripheral angiopathy without gangrene, with long-term current use of insulin (HCC) - He came with persistently above target blood glucose readings mainly due to dietary indiscretions and due to withdrawal of Bydureon for unclear reasons. -This is despite large dose of insulin greater than 250 units daily. He remains at a high risk for more acute and chronic complications of diabetes which include CAD, CVA, CKD, retinopathy, and neuropathy. These are all discussed in detail with the patient.  Patient came with higher A1c of 9.6% from 8.6%.   Glucose logs and insulin administration records pertaining to this visit,  to be scanned into patient's records.  Recent labs reviewed.  - I have re-counseled the patient on diet management and weight loss  by adopting a carbohydrate restricted / protein rich  Diet.  -  Suggestion is made for him to avoid simple carbohydrates  from his diet including Cakes, Sweet Desserts / Pastries, Ice Cream, Soda (diet and regular), Sweet Tea, Candies, Chips, Cookies, Store Bought Juices, Alcohol in Excess of  1-2 drinks a day, Artificial Sweeteners, and "Sugar-free" Products. This will help patient to have stable blood glucose profile and potentially avoid unintended weight gain.  - Patient is advised to stick to a routine mealtimes to eat 3 meals  a day and avoid unnecessary snacks ( to  snack only to correct hypoglycemia).   - I have approached patient with the following individualized plan to manage diabetes and patient agrees. - Due to his extreme insulin resistance and the fact that he is requiring more than 200 units of insulin a day, he will be considered for insulin Humulin R U500 after he finishes up his current supplies of insulin U100.  - In the meantime, I advised him to continue Lantus  100 units daily at bedtime,   NovoLog to 40 units 3 times a day before meals plus correction dose , with strict monitoring of blood glucose 4 times a day-before meals and at bedtime.   -I will prescribe insulin U500 today, he will pick up, advised not to start until he returns in 4 weeks with the new insulin for demonstration.  -Patient is encouraged to call clinic for blood glucose levels less than 70 or above 300 mg /dl. -I will continue Metformin 1gm   by mouth twice a day and  resume Bydureon 2 mg subcutaneously  weekly.  - He is a perfect candidate for bariatric surgery, he has been hesitant to consider this option. -He is following with Norm SaltPenny Crumpton, CDE for DM education.  - Patient specific target  for A1c; LDL, HDL, Triglycerides, and  Waist Circumference were discussed in detail.  2) BP/HTN: His blood pressure is not controlled to target, I advised him to continue current medications  including ACEI/ARB. 3) Lipids/HPL:  continue statins. 4)  Weight/Diet:  He is gaining weight ,CDE consult in progress, exercise, and carbohydrates information provided.  5) Chronic Care/Health Maintenance:  -Patient  Is  on ACEI/ARB and Statin medications and encouraged to continue to follow up with Ophthalmology, Podiatrist at least yearly or according to recommendations, and advised to  stay away from smoking. I have recommended yearly flu vaccine and pneumonia vaccination at least every 5 years; and  sleep for at  least 7 hours a day. -This patient cannot exercise optimally due to his  heavy weight and comorbidities.  - Time spent with the patient: 25 min, of which >50% was spent in reviewing his blood glucose logs , discussing his hypo- and hyper-glycemic episodes, reviewing his current and  previous labs and insulin doses and developing a plan to avoid hypo- and hyper-glycemia. Please refer to Patient Instructions for Blood Glucose Monitoring and Insulin/Medications Dosing Guide"  in media tab for additional information.  - I advised patient to maintain close follow up with Kari Baars, MD for primary care needs.t.   Follow up plan: -Return in about 4 weeks (around 03/07/2017) for follow up with meter and logs- no labs, bring your new insulin with you.Marquis Lunch, MD Phone: [phone]  Fax: [phone]  -  This note was partially dictated with voice recognition software. Similar sounding words can be transcribed inadequately or may not  be corrected upon review.  02/07/2017, 1:57 PM

## 2017-02-28 ENCOUNTER — Other Ambulatory Visit: Payer: Self-pay | Admitting: "Endocrinology

## 2017-03-07 ENCOUNTER — Ambulatory Visit: Payer: Medicaid Other | Admitting: "Endocrinology

## 2017-03-13 ENCOUNTER — Encounter: Payer: Self-pay | Admitting: "Endocrinology

## 2017-03-13 ENCOUNTER — Ambulatory Visit: Payer: Medicaid Other | Admitting: "Endocrinology

## 2017-03-13 VITALS — BP 134/80 | HR 76 | Ht 72.0 in | Wt >= 6400 oz

## 2017-03-13 DIAGNOSIS — E782 Mixed hyperlipidemia: Secondary | ICD-10-CM

## 2017-03-13 DIAGNOSIS — E1151 Type 2 diabetes mellitus with diabetic peripheral angiopathy without gangrene: Secondary | ICD-10-CM

## 2017-03-13 DIAGNOSIS — I10 Essential (primary) hypertension: Secondary | ICD-10-CM | POA: Diagnosis not present

## 2017-03-13 DIAGNOSIS — E1165 Type 2 diabetes mellitus with hyperglycemia: Secondary | ICD-10-CM | POA: Diagnosis not present

## 2017-03-13 DIAGNOSIS — Z794 Long term (current) use of insulin: Secondary | ICD-10-CM | POA: Diagnosis not present

## 2017-03-13 DIAGNOSIS — IMO0002 Reserved for concepts with insufficient information to code with codable children: Secondary | ICD-10-CM

## 2017-03-13 MED ORDER — INSULIN REGULAR HUMAN (CONC) 500 UNIT/ML ~~LOC~~ SOPN: 60.0000 [IU] | PEN_INJECTOR | Freq: Three times a day (TID) | SUBCUTANEOUS | 2 refills | Status: DC

## 2017-03-13 NOTE — Patient Instructions (Signed)

## 2017-03-13 NOTE — Progress Notes (Signed)
Subjective:    Patient ID: Aaron Potter, male    DOB: 06/02/1960, PCP Kari Baars, MD   Past Medical History:  Diagnosis Date  . Asthma   . Balanitis   . Cellulitis   . COPD (chronic obstructive pulmonary disease) (HCC)   . Diabetes mellitus without complication (HCC)   . Gout   . Hypertension   . Lichen sclerosus   . Obesity, morbid (more than 100 lbs over ideal weight or BMI > 40) (HCC)   . Sleep apnea   . Urticaria    Past Surgical History:  Procedure Laterality Date  . CHOLECYSTECTOMY     Social History   Socioeconomic History  . Marital status: Single    Spouse name: None  . Number of children: None  . Years of education: None  . Highest education level: None  Social Needs  . Financial resource strain: None  . Food insecurity - worry: None  . Food insecurity - inability: None  . Transportation needs - medical: None  . Transportation needs - non-medical: None  Occupational History  . None  Tobacco Use  . Smoking status: Never Smoker  . Smokeless tobacco: Never Used  Substance and Sexual Activity  . Alcohol use: No    Alcohol/week: 0.0 oz  . Drug use: No  . Sexual activity: Yes  Other Topics Concern  . None  Social History Narrative  . None   Outpatient Encounter Medications as of 03/13/2017  Medication Sig  . ACCU-CHEK AVIVA PLUS test strip USE TO CHECK BLOOD SUGAR FOUR TIMES A DAY.  Marland Kitchen albuterol (PROAIR HFA) 108 (90 BASE) MCG/ACT inhaler Inhale 2 puffs into the lungs every 4 (four) hours as needed for wheezing or shortness of breath. Sometimes only uses one puff  . allopurinol (ZYLOPRIM) 300 MG tablet Take 300 mg by mouth daily.    Marland Kitchen ALPRAZolam (XANAX) 1 MG tablet Take 1 mg by mouth 4 (four) times daily.    . benazepril (LOTENSIN) 5 MG tablet TAKE 1 TABLET ONCE DAILY.  Marland Kitchen BYDUREON BCISE 2 MG/0.85ML AUIJ INJECT 2MG  INTO THE SKIN ONCE A WEEK.  . diphenhydrAMINE (BENADRYL) 25 mg capsule Take 50 mg by mouth every 6 (six) hours as needed for  itching.  . Exenatide ER 2 MG/0.85ML AUIJ Inject 2 mg into the skin once a week.  Marland Kitchen GLOBAL EASE INJECT PEN NEEDLES 31G X 8 MM MISC USE 5 TIMES DAILY.  Marland Kitchen HYDROcodone-acetaminophen (NORCO) 10-325 MG per tablet Take 1 tablet by mouth 4 (four) times daily.  . indomethacin (INDOCIN) 50 MG capsule Take 50 mg by mouth daily.   . insulin regular human CONCENTRATED (HUMULIN R U-500 KWIKPEN) 500 UNIT/ML kwikpen Inject 60 Units into the skin 3 (three) times daily with meals.  . metFORMIN (GLUCOPHAGE) 500 MG tablet Take 1,000 mg by mouth 2 (two) times daily with a meal.   . Multiple Vitamin (MULTIVITAMIN WITH MINERALS) TABS tablet Take 1 tablet by mouth daily.  . pravastatin (PRAVACHOL) 40 MG tablet Take 40 mg by mouth daily.  Marland Kitchen tiotropium (SPIRIVA) 18 MCG inhalation capsule Place 18 mcg into inhaler and inhale daily.  . verapamil (CALAN-SR) 240 MG CR tablet Take 240 mg by mouth daily.  . Vitamin D, Ergocalciferol, (DRISDOL) 50000 units CAPS capsule TAKE 1 CAPSULE WEEKLY.  . zolpidem (AMBIEN) 10 MG tablet Take 10 mg by mouth at bedtime as needed for sleep.   . [DISCONTINUED] insulin regular human CONCENTRATED (HUMULIN R U-500 KWIKPEN) 500 UNIT/ML kwikpen Inject  60 Units into the skin 3 (three) times daily with meals. (Patient not taking: Reported on 03/13/2017)   No facility-administered encounter medications on file as of 03/13/2017.    ALLERGIES: Allergies  Allergen Reactions  . Biaxin [Clarithromycin] Anaphylaxis and Shortness Of Breath  . Peanut-Containing Drug Products Anaphylaxis   VACCINATION STATUS:  There is no immunization history on file for this patient.  Diabetes  He presents for his follow-up diabetic visit. He has type 2 diabetes mellitus. Onset time: He was diagnosed at approximate age of 70 years. His disease course has been worsening. There are no hypoglycemic associated symptoms. Pertinent negatives for hypoglycemia include no confusion, headaches, pallor or seizures. Associated  symptoms include polydipsia and polyuria. Pertinent negatives for diabetes include no chest pain, no fatigue, no polyphagia and no weakness. There are no hypoglycemic complications. Symptoms are worsening. Diabetic complications include PVD. Risk factors for coronary artery disease include dyslipidemia, diabetes mellitus, family history, male sex, obesity and sedentary lifestyle. Current diabetic treatment includes intensive insulin program. He is compliant with treatment most of the time. His weight is increasing steadily. He is following a generally unhealthy diet. He has had a previous visit with a dietitian. He never participates in exercise. His home blood glucose trend is increasing steadily. His breakfast blood glucose range is generally >200 mg/dl. His lunch blood glucose range is generally >200 mg/dl. His dinner blood glucose range is generally >200 mg/dl. His bedtime blood glucose range is generally >200 mg/dl. His overall blood glucose range is >200 mg/dl. An ACE inhibitor/angiotensin II receptor blocker is being taken.  Hyperlipidemia  This is a chronic problem. The current episode started more than 1 year ago. The problem is uncontrolled. Exacerbating diseases include diabetes and obesity. Pertinent negatives include no chest pain, myalgias or shortness of breath. Current antihyperlipidemic treatment includes statins. Risk factors for coronary artery disease include diabetes mellitus, dyslipidemia, hypertension, male sex, obesity and a sedentary lifestyle.  Hypertension  This is a chronic problem. The current episode started more than 1 year ago. The problem is uncontrolled. Pertinent negatives include no chest pain, headaches, neck pain, palpitations or shortness of breath. Past treatments include ACE inhibitors and calcium channel blockers. The current treatment provides no improvement. Compliance problems include diet.  Hypertensive end-organ damage includes PVD.     Review of Systems   Constitutional: Negative for fatigue and unexpected weight change.  HENT: Negative for dental problem, mouth sores and trouble swallowing.   Eyes: Negative for visual disturbance.  Respiratory: Negative for cough, choking, chest tightness, shortness of breath and wheezing.   Cardiovascular: Negative for chest pain, palpitations and leg swelling.  Gastrointestinal: Negative for abdominal distention, abdominal pain, constipation, diarrhea, nausea and vomiting.  Endocrine: Positive for polydipsia and polyuria. Negative for polyphagia.  Genitourinary: Negative for dysuria, flank pain, hematuria and urgency.  Musculoskeletal: Negative for back pain, gait problem, myalgias and neck pain.  Skin: Negative for pallor, rash and wound.  Neurological: Negative for seizures, syncope, weakness, numbness and headaches.  Psychiatric/Behavioral: Negative.  Negative for confusion and dysphoric mood.    Objective:    BP 134/80   Pulse 76   Ht 6' (1.829 m)   Wt (!) 431 lb (195.5 kg)   BMI 58.45 kg/m   Wt Readings from Last 3 Encounters:  03/13/17 (!) 431 lb (195.5 kg)  02/07/17 (!) 435 lb (197.3 kg)  01/26/17 (!) 430 lb (195 kg)    Physical Exam  Constitutional: He is oriented to person, place, and time.  He appears well-developed and well-nourished. He is cooperative. No distress.  HENT:  Head: Normocephalic and atraumatic.  Eyes: EOM are normal.  Neck: Normal range of motion. Neck supple. No tracheal deviation present. No thyromegaly present.  Cardiovascular: Normal rate, S1 normal, S2 normal and normal heart sounds. Exam reveals no gallop.  No murmur heard. Pulses:      Dorsalis pedis pulses are 0 on the right side, and 0 on the left side.       Posterior tibial pulses are 0 on the right side, and 0 on the left side.  Pulmonary/Chest: Breath sounds normal. No respiratory distress. He has no wheezes.  Abdominal: Soft. Bowel sounds are normal. He exhibits no distension. There is no tenderness.  There is no guarding and no CVA tenderness.  Musculoskeletal: He exhibits no edema.       Right shoulder: He exhibits no swelling and no deformity.  Neurological: He is alert and oriented to person, place, and time. He has normal strength and normal reflexes. No cranial nerve deficit or sensory deficit. Gait normal.  Skin: Skin is warm and dry. No rash noted. No cyanosis. Nails show no clubbing.  Psychiatric: He has a normal mood and affect. His speech is normal and behavior is normal. Judgment and thought content normal. Cognition and memory are normal.    Results for orders placed or performed in visit on 10/21/16  COMPLETE METABOLIC PANEL WITH GFR  Result Value Ref Range   Glucose, Bld 229 (H) 65 - 139 mg/dL   BUN 12 7 - 25 mg/dL   Creat 1.61 0.96 - 0.45 mg/dL   GFR, Est Non African American 103 > OR = 60 mL/min/1.23m2   GFR, Est African American 119 > OR = 60 mL/min/1.69m2   BUN/Creatinine Ratio NOT APPLICABLE 6 - 22 (calc)   Sodium 137 135 - 146 mmol/L   Potassium 4.8 3.5 - 5.3 mmol/L   Chloride 99 98 - 110 mmol/L   CO2 28 20 - 32 mmol/L   Calcium 10.0 8.6 - 10.3 mg/dL   Total Protein 6.7 6.1 - 8.1 g/dL   Albumin 4.0 3.6 - 5.1 g/dL   Globulin 2.7 1.9 - 3.7 g/dL (calc)   AG Ratio 1.5 1.0 - 2.5 (calc)   Total Bilirubin 0.4 0.2 - 1.2 mg/dL   Alkaline phosphatase (APISO) 73 40 - 115 U/L   AST 28 10 - 35 U/L   ALT 25 9 - 46 U/L  Hemoglobin A1c  Result Value Ref Range   Hgb A1c MFr Bld 9.6 (H) <5.7 % of total Hgb   Mean Plasma Glucose 229 (calc)   eAG (mmol/L) 12.7 (calc)   Diabetic Labs (most recent): Lab Results  Component Value Date   HGBA1C 9.6 (H) 01/18/2017   HGBA1C 9.2 (H) 10/15/2016   HGBA1C 8.6 (H) 07/13/2016   Lipid Panel     Component Value Date/Time   CHOL 163 10/15/2016 0933   TRIG 92 10/15/2016 0933   HDL 45 10/15/2016 0933   CHOLHDL 3.6 10/15/2016 0933   VLDL 31 (H) 03/12/2015 1200   LDLCALC 106 03/12/2015 1200     Assessment & Plan:   1.  Uncontrolled type 2 diabetes mellitus with diabetic peripheral angiopathy without gangrene, with long-term current use of insulin (HCC) - He came with persistently above target blood glucose readings mainly due to dietary indiscretions .  -This is despite large dose of insulin greater than 250 units daily. He remains at a high risk for more  acute and chronic complications of diabetes which include CAD, CVA, CKD, retinopathy, and neuropathy. These are all discussed in detail with the patient.  Patient recently came with higher A1c of 9.6% from 8.6%.   Glucose logs and insulin administration records pertaining to this visit,  to be scanned into patient's records.  Recent labs reviewed.  - I have re-counseled the patient on diet management and weight loss  by adopting a carbohydrate restricted / protein rich  Diet.  -  Suggestion is made for him to avoid simple carbohydrates  from his diet including Cakes, Sweet Desserts / Pastries, Ice Cream, Soda (diet and regular), Sweet Tea, Candies, Chips, Cookies, Store Bought Juices, Alcohol in Excess of  1-2 drinks a day, Artificial Sweeteners, and "Sugar-free" Products. This will help patient to have stable blood glucose profile and potentially avoid unintended weight gain.  - Patient is advised to stick to a routine mealtimes to eat 3 meals  a day and avoid unnecessary snacks ( to snack only to correct hypoglycemia).   -He brought with him his prescription for insulin Humulin R  U500 for demonstration.  -I discussed and initiated insulin U500 60 units 3 times daily before meals for pre-meal blood glucose readings of 90 or above milligrams per deciliter associated with strict monitoring of blood glucose 4 times a day-before meals and at bedtime.    -He is advised to discontinue Lantus, NovoLog and all other kinds of insulin he may have at home.   -He is advised to return in 2 weeks with his meter and logs for evaluation.   -Patient is encouraged to call  clinic for blood glucose levels less than 70 or above 300 mg /dl. -I will continue Metformin 1gm   by mouth twice a day and  resume Bydureon 2 mg subcutaneously  weekly.  - He is a perfect candidate for bariatric surgery, he has been hesitant to consider this option. -He is following with Norm SaltPenny Crumpton, CDE for DM education.  - Patient specific target  for A1c; LDL, HDL, Triglycerides, and  Waist Circumference were discussed in detail.  2) BP/HTN: His blood pressure is not controlled to target,  I advised him to continue current medications  including ACEI/ARB. 3) Lipids/HPL: Lipid panel uncontrolled with LDL of 99, he is advised to continue statins. 4)  Weight/Diet:  He is progressively gaining weight ,CDE consult in progress, exercise, and carbohydrates information provided.  5) Chronic Care/Health Maintenance:  -Patient  Is  on ACEI/ARB and Statin medications and encouraged to continue to follow up with Ophthalmology, Podiatrist at least yearly or according to recommendations, and advised to  stay away from smoking. I have recommended yearly flu vaccine and pneumonia vaccination at least every 5 years; and  sleep for at least 7 hours a day. -This patient cannot exercise optimally due to his heavy weight and comorbidities.  - Time spent with the patient: 25 min, of which >50% was spent in reviewing his blood glucose logs , discussing his hypo- and hyper-glycemic episodes, reviewing his current and  previous labs and insulin doses and developing a plan to avoid hypo- and hyper-glycemia. Please refer to Patient Instructions for Blood Glucose Monitoring and Insulin/Medications Dosing Guide"  in media tab for additional information.  - I advised patient to maintain close follow up with Kari BaarsHawkins, Edward, MD for primary care needs.t.   Follow up plan: -Return in about 2 weeks (around 03/27/2017) for meter, and logs, follow up with meter and logs- no labs.  Marquis Lunch, MD Phone: [phone]  Fax:  [phone]  -  This note was partially dictated with voice recognition software. Similar sounding words can be transcribed inadequately or may not  be corrected upon review.  03/13/2017, 5:21 PM

## 2017-03-22 ENCOUNTER — Other Ambulatory Visit: Payer: Self-pay | Admitting: "Endocrinology

## 2017-03-27 ENCOUNTER — Encounter: Payer: Self-pay | Admitting: "Endocrinology

## 2017-03-27 ENCOUNTER — Ambulatory Visit: Payer: Medicaid Other | Admitting: "Endocrinology

## 2017-03-27 VITALS — BP 147/72 | HR 84 | Ht 72.0 in | Wt >= 6400 oz

## 2017-03-27 DIAGNOSIS — E782 Mixed hyperlipidemia: Secondary | ICD-10-CM

## 2017-03-27 DIAGNOSIS — E1151 Type 2 diabetes mellitus with diabetic peripheral angiopathy without gangrene: Secondary | ICD-10-CM | POA: Diagnosis not present

## 2017-03-27 DIAGNOSIS — E1165 Type 2 diabetes mellitus with hyperglycemia: Secondary | ICD-10-CM | POA: Diagnosis not present

## 2017-03-27 DIAGNOSIS — I10 Essential (primary) hypertension: Secondary | ICD-10-CM | POA: Diagnosis not present

## 2017-03-27 DIAGNOSIS — Z794 Long term (current) use of insulin: Secondary | ICD-10-CM | POA: Diagnosis not present

## 2017-03-27 DIAGNOSIS — IMO0002 Reserved for concepts with insufficient information to code with codable children: Secondary | ICD-10-CM

## 2017-03-27 MED ORDER — INSULIN REGULAR HUMAN (CONC) 500 UNIT/ML ~~LOC~~ SOPN: 80.0000 [IU] | PEN_INJECTOR | Freq: Three times a day (TID) | SUBCUTANEOUS | 2 refills | Status: DC

## 2017-03-27 NOTE — Progress Notes (Signed)
Subjective:    Patient ID: Aaron Potter, male    DOB: 1960/02/21, PCP Kari Baars, MD   Past Medical History:  Diagnosis Date  . Asthma   . Balanitis   . Cellulitis   . COPD (chronic obstructive pulmonary disease) (HCC)   . Diabetes mellitus without complication (HCC)   . Gout   . Hypertension   . Lichen sclerosus   . Obesity, morbid (more than 100 lbs over ideal weight or BMI > 40) (HCC)   . Sleep apnea   . Urticaria    Past Surgical History:  Procedure Laterality Date  . CHOLECYSTECTOMY     Social History   Socioeconomic History  . Marital status: Single    Spouse name: Not on file  . Number of children: Not on file  . Years of education: Not on file  . Highest education level: Not on file  Occupational History  . Not on file  Social Needs  . Financial resource strain: Not on file  . Food insecurity:    Worry: Not on file    Inability: Not on file  . Transportation needs:    Medical: Not on file    Non-medical: Not on file  Tobacco Use  . Smoking status: Never Smoker  . Smokeless tobacco: Never Used  Substance and Sexual Activity  . Alcohol use: No    Alcohol/week: 0.0 oz  . Drug use: No  . Sexual activity: Yes  Lifestyle  . Physical activity:    Days per week: Not on file    Minutes per session: Not on file  . Stress: Not on file  Relationships  . Social connections:    Talks on phone: Not on file    Gets together: Not on file    Attends religious service: Not on file    Active member of club or organization: Not on file    Attends meetings of clubs or organizations: Not on file    Relationship status: Not on file  Other Topics Concern  . Not on file  Social History Narrative  . Not on file   Outpatient Encounter Medications as of 03/27/2017  Medication Sig  . ACCU-CHEK AVIVA PLUS test strip USE TO CHECK BLOOD SUGAR FOUR TIMES A DAY.  Marland Kitchen albuterol (PROAIR HFA) 108 (90 BASE) MCG/ACT inhaler Inhale 2 puffs into the lungs every 4 (four)  hours as needed for wheezing or shortness of breath. Sometimes only uses one puff  . allopurinol (ZYLOPRIM) 300 MG tablet Take 300 mg by mouth daily.    Marland Kitchen ALPRAZolam (XANAX) 1 MG tablet Take 1 mg by mouth 4 (four) times daily.    . benazepril (LOTENSIN) 5 MG tablet TAKE 1 TABLET ONCE DAILY.  Marland Kitchen BYDUREON BCISE 2 MG/0.85ML AUIJ INJECT 2MG  INTO THE SKIN ONCE A WEEK.  . diphenhydrAMINE (BENADRYL) 25 mg capsule Take 50 mg by mouth every 6 (six) hours as needed for itching.  . Exenatide ER 2 MG/0.85ML AUIJ Inject 2 mg into the skin once a week.  Marland Kitchen GLOBAL EASE INJECT PEN NEEDLES 31G X 8 MM MISC USE 5 TIMES DAILY.  Marland Kitchen HYDROcodone-acetaminophen (NORCO) 10-325 MG per tablet Take 1 tablet by mouth 4 (four) times daily.  . indomethacin (INDOCIN) 50 MG capsule Take 50 mg by mouth daily.   . insulin regular human CONCENTRATED (HUMULIN R U-500 KWIKPEN) 500 UNIT/ML kwikpen Inject 80 Units into the skin 3 (three) times daily with meals.  . metFORMIN (GLUCOPHAGE) 500 MG tablet Take  1,000 mg by mouth 2 (two) times daily with a meal.   . Multiple Vitamin (MULTIVITAMIN WITH MINERALS) TABS tablet Take 1 tablet by mouth daily.  . pravastatin (PRAVACHOL) 40 MG tablet Take 40 mg by mouth daily.  Marland Kitchen tiotropium (SPIRIVA) 18 MCG inhalation capsule Place 18 mcg into inhaler and inhale daily.  . verapamil (CALAN-SR) 240 MG CR tablet Take 240 mg by mouth daily.  . Vitamin D, Ergocalciferol, (DRISDOL) 50000 units CAPS capsule TAKE 1 CAPSULE WEEKLY.  . zolpidem (AMBIEN) 10 MG tablet Take 10 mg by mouth at bedtime as needed for sleep.   . [DISCONTINUED] insulin regular human CONCENTRATED (HUMULIN R U-500 KWIKPEN) 500 UNIT/ML kwikpen Inject 60 Units into the skin 3 (three) times daily with meals.   No facility-administered encounter medications on file as of 03/27/2017.    ALLERGIES: Allergies  Allergen Reactions  . Biaxin [Clarithromycin] Anaphylaxis and Shortness Of Breath  . Peanut-Containing Drug Products Anaphylaxis    VACCINATION STATUS:  There is no immunization history on file for this patient.  Diabetes  He presents for his follow-up diabetic visit. He has type 2 diabetes mellitus. Onset time: He was diagnosed at approximate age of 61 years. His disease course has been worsening. There are no hypoglycemic associated symptoms. Pertinent negatives for hypoglycemia include no confusion, headaches, pallor or seizures. Associated symptoms include polydipsia and polyuria. Pertinent negatives for diabetes include no chest pain, no fatigue, no polyphagia and no weakness. There are no hypoglycemic complications. Symptoms are worsening. Diabetic complications include PVD. Risk factors for coronary artery disease include dyslipidemia, diabetes mellitus, family history, male sex, obesity and sedentary lifestyle. Current diabetic treatment includes intensive insulin program. He is compliant with treatment most of the time. His weight is increasing steadily. He is following a generally unhealthy diet. He has had a previous visit with a dietitian. He never participates in exercise. His home blood glucose trend is increasing steadily. His breakfast blood glucose range is generally >200 mg/dl. His lunch blood glucose range is generally >200 mg/dl. His dinner blood glucose range is generally >200 mg/dl. His bedtime blood glucose range is generally >200 mg/dl. His overall blood glucose range is >200 mg/dl. An ACE inhibitor/angiotensin II receptor blocker is being taken.  Hyperlipidemia  This is a chronic problem. The current episode started more than 1 year ago. The problem is uncontrolled. Exacerbating diseases include diabetes and obesity. Pertinent negatives include no chest pain, myalgias or shortness of breath. Current antihyperlipidemic treatment includes statins. Risk factors for coronary artery disease include diabetes mellitus, dyslipidemia, hypertension, male sex, obesity and a sedentary lifestyle.  Hypertension  This is a  chronic problem. The current episode started more than 1 year ago. The problem is uncontrolled. Pertinent negatives include no chest pain, headaches, neck pain, palpitations or shortness of breath. Past treatments include ACE inhibitors and calcium channel blockers. The current treatment provides no improvement. Compliance problems include diet.  Hypertensive end-organ damage includes PVD.     Review of Systems  Constitutional: Negative for fatigue and unexpected weight change.  HENT: Negative for dental problem, mouth sores and trouble swallowing.   Eyes: Negative for visual disturbance.  Respiratory: Negative for cough, choking, chest tightness, shortness of breath and wheezing.   Cardiovascular: Negative for chest pain, palpitations and leg swelling.  Gastrointestinal: Negative for abdominal distention, abdominal pain, constipation, diarrhea, nausea and vomiting.  Endocrine: Positive for polydipsia and polyuria. Negative for polyphagia.  Genitourinary: Negative for dysuria, flank pain, hematuria and urgency.  Musculoskeletal: Negative for  back pain, gait problem, myalgias and neck pain.  Skin: Negative for pallor, rash and wound.  Neurological: Negative for seizures, syncope, weakness, numbness and headaches.  Psychiatric/Behavioral: Negative.  Negative for confusion and dysphoric mood.    Objective:    BP (!) 147/72   Pulse 84   Ht 6' (1.829 m)   Wt (!) 432 lb (196 kg)   BMI 58.59 kg/m   Wt Readings from Last 3 Encounters:  03/27/17 (!) 432 lb (196 kg)  03/13/17 (!) 431 lb (195.5 kg)  02/07/17 (!) 435 lb (197.3 kg)    Physical Exam  Constitutional: He is oriented to person, place, and time. He appears well-developed and well-nourished. He is cooperative. No distress.  HENT:  Head: Normocephalic and atraumatic.  Eyes: EOM are normal.  Neck: Normal range of motion. Neck supple. No tracheal deviation present. No thyromegaly present.  Cardiovascular: Normal rate, S1 normal, S2  normal and normal heart sounds. Exam reveals no gallop.  No murmur heard. Pulses:      Dorsalis pedis pulses are 0 on the right side, and 0 on the left side.       Posterior tibial pulses are 0 on the right side, and 0 on the left side.  Pulmonary/Chest: Breath sounds normal. No respiratory distress. He has no wheezes.  Abdominal: Soft. Bowel sounds are normal. He exhibits no distension. There is no tenderness. There is no guarding and no CVA tenderness.  Musculoskeletal: He exhibits no edema.       Right shoulder: He exhibits no swelling and no deformity.  Neurological: He is alert and oriented to person, place, and time. He has normal strength and normal reflexes. No cranial nerve deficit or sensory deficit. Gait normal.  Skin: Skin is warm and dry. No rash noted. No cyanosis. Nails show no clubbing.  Psychiatric: He has a normal mood and affect. His speech is normal and behavior is normal. Judgment and thought content normal. Cognition and memory are normal.    Results for orders placed or performed in visit on 10/21/16  COMPLETE METABOLIC PANEL WITH GFR  Result Value Ref Range   Glucose, Bld 229 (H) 65 - 139 mg/dL   BUN 12 7 - 25 mg/dL   Creat 1.610.77 0.960.70 - 0.451.33 mg/dL   GFR, Est Non African American 103 > OR = 60 mL/min/1.6873m2   GFR, Est African American 119 > OR = 60 mL/min/1.3173m2   BUN/Creatinine Ratio NOT APPLICABLE 6 - 22 (calc)   Sodium 137 135 - 146 mmol/L   Potassium 4.8 3.5 - 5.3 mmol/L   Chloride 99 98 - 110 mmol/L   CO2 28 20 - 32 mmol/L   Calcium 10.0 8.6 - 10.3 mg/dL   Total Protein 6.7 6.1 - 8.1 g/dL   Albumin 4.0 3.6 - 5.1 g/dL   Globulin 2.7 1.9 - 3.7 g/dL (calc)   AG Ratio 1.5 1.0 - 2.5 (calc)   Total Bilirubin 0.4 0.2 - 1.2 mg/dL   Alkaline phosphatase (APISO) 73 40 - 115 U/L   AST 28 10 - 35 U/L   ALT 25 9 - 46 U/L  Hemoglobin A1c  Result Value Ref Range   Hgb A1c MFr Bld 9.6 (H) <5.7 % of total Hgb   Mean Plasma Glucose 229 (calc)   eAG (mmol/L) 12.7  (calc)   Diabetic Labs (most recent): Lab Results  Component Value Date   HGBA1C 9.6 (H) 01/18/2017   HGBA1C 9.2 (H) 10/15/2016   HGBA1C 8.6 (H) 07/13/2016  Lipid Panel     Component Value Date/Time   CHOL 163 10/15/2016 0933   TRIG 92 10/15/2016 0933   HDL 45 10/15/2016 0933   CHOLHDL 3.6 10/15/2016 0933   VLDL 31 (H) 03/12/2015 1200   LDLCALC 99 10/15/2016 0933     Assessment & Plan:   1. Uncontrolled type 2 diabetes mellitus with diabetic peripheral angiopathy without gangrene, with long-term current use of insulin (HCC) - He came with persistently above target blood glucose readings mainly due to dietary indiscretions .  -This is despite large dose of insulin greater than 250 units daily. He remains at a high risk for more acute and chronic complications of diabetes which include CAD, CVA, CKD, retinopathy, and neuropathy. These are all discussed in detail with the patient. -He was switched to insulin U500 due to his requirement for 200+ units of insulin U100. -He came with consistent but still above target blood glucose profile. Patient recently came with higher A1c of 9.6% from 8.6%.   Glucose logs and insulin administration records pertaining to this visit,  to be scanned into patient's records.  Recent labs reviewed.  - I have re-counseled the patient on diet management and weight loss  by adopting a carbohydrate restricted / protein rich  Diet.  -  Suggestion is made for him to avoid simple carbohydrates  from his diet including Cakes, Sweet Desserts / Pastries, Ice Cream, Soda (diet and regular), Sweet Tea, Candies, Chips, Cookies, Store Bought Juices, Alcohol in Excess of  1-2 drinks a day, Artificial Sweeteners, and "Sugar-free" Products. This will help patient to have stable blood glucose profile and potentially avoid unintended weight gain.   - Patient is advised to stick to a routine mealtimes to eat 3 meals  a day and avoid unnecessary snacks ( to snack only to  correct hypoglycemia).   -I discussed and increased his insulin U500 to 80 units 3 times daily before meals for pre-meal blood glucose readings of 90 or above milligrams per deciliter associated with strict monitoring of blood glucose 4 times a day-before meals and at bedtime.    -He is advised to discontinue Lantus, NovoLog and all other kinds of insulin he may have at home.    -Patient is encouraged to call clinic for blood glucose levels less than 70 or above 300 mg /dl. -I will continue Metformin 1gm   by mouth twice a day and  resume Bydureon 2 mg subcutaneously  weekly.  - He is a perfect candidate for bariatric surgery, he has been hesitant to consider this option. -He is following with Norm Salt, CDE for DM education.  - Patient specific target  for A1c; LDL, HDL, Triglycerides, and  Waist Circumference were discussed in detail.  2) BP/HTN: His blood pressure is not controlled to target,  I advised him to continue current medications  including ACEI/ARB. 3) Lipids/HPL: Lipid panel uncontrolled with LDL of 99, he is advised to continue statins. 4)  Weight/Diet:  He is progressively gaining weight ,CDE consult in progress, exercise, and carbohydrates information provided.  5) Chronic Care/Health Maintenance:  -Patient  Is  on ACEI/ARB and Statin medications and encouraged to continue to follow up with Ophthalmology, Podiatrist at least yearly or according to recommendations, and advised to  stay away from smoking. I have recommended yearly flu vaccine and pneumonia vaccination at least every 5 years; and  sleep for at least 7 hours a day. -This patient cannot exercise optimally due to his heavy weight and comorbidities.  -  Time spent with the patient: 25 min, of which >50% was spent in reviewing his blood glucose logs , discussing his hypo- and hyper-glycemic episodes, reviewing his current and  previous labs and insulin doses and developing a plan to avoid hypo- and  hyper-glycemia. Please refer to Patient Instructions for Blood Glucose Monitoring and Insulin/Medications Dosing Guide"  in media tab for additional information. Aaron Potter participated in the discussions, expressed understanding, and voiced agreement with the above plans.  All questions were answered to his satisfaction. he is encouraged to contact clinic should he have any questions or concerns prior to his return visit.   - I advised patient to maintain close follow up with Kari Baars, MD for primary care needs.   Follow up plan: -Return in about 6 weeks (around 05/08/2017) for meter, and logs.  Marquis Lunch, MD Phone: [phone]  Fax: [phone]  -  This note was partially dictated with voice recognition software. Similar sounding words can be transcribed inadequately or may not  be corrected upon review.  03/27/2017, 3:35 PM

## 2017-03-27 NOTE — Patient Instructions (Signed)

## 2017-03-31 ENCOUNTER — Other Ambulatory Visit: Payer: Self-pay

## 2017-03-31 MED ORDER — INSULIN REGULAR HUMAN (CONC) 500 UNIT/ML ~~LOC~~ SOPN: 80.0000 [IU] | PEN_INJECTOR | Freq: Three times a day (TID) | SUBCUTANEOUS | 2 refills | Status: DC

## 2017-04-03 ENCOUNTER — Other Ambulatory Visit: Payer: Self-pay | Admitting: "Endocrinology

## 2017-04-03 HISTORY — PX: FOOT SURGERY: SHX648

## 2017-04-07 ENCOUNTER — Other Ambulatory Visit: Payer: Self-pay

## 2017-04-07 MED ORDER — INSULIN REGULAR HUMAN (CONC) 500 UNIT/ML ~~LOC~~ SOPN: 80.0000 [IU] | PEN_INJECTOR | Freq: Three times a day (TID) | SUBCUTANEOUS | 2 refills | Status: DC

## 2017-04-18 ENCOUNTER — Other Ambulatory Visit: Payer: Self-pay

## 2017-04-18 MED ORDER — INSULIN REGULAR HUMAN (CONC) 500 UNIT/ML ~~LOC~~ SOPN: 80.0000 [IU] | PEN_INJECTOR | Freq: Three times a day (TID) | SUBCUTANEOUS | 2 refills | Status: DC

## 2017-05-03 ENCOUNTER — Other Ambulatory Visit: Payer: Self-pay | Admitting: "Endocrinology

## 2017-05-03 LAB — COMPLETE METABOLIC PANEL WITH GFR
AG RATIO: 1.6 (calc) (ref 1.0–2.5)
ALT: 24 U/L (ref 9–46)
AST: 26 U/L (ref 10–35)
Albumin: 3.9 g/dL (ref 3.6–5.1)
Alkaline phosphatase (APISO): 70 U/L (ref 40–115)
BUN: 15 mg/dL (ref 7–25)
CALCIUM: 9.4 mg/dL (ref 8.6–10.3)
CO2: 27 mmol/L (ref 20–32)
CREATININE: 0.83 mg/dL (ref 0.70–1.33)
Chloride: 102 mmol/L (ref 98–110)
GFR, EST AFRICAN AMERICAN: 116 mL/min/{1.73_m2} (ref >=60)
GFR, EST NON AFRICAN AMERICAN: 100 mL/min/{1.73_m2} (ref >=60)
GLOBULIN: 2.5 g/dL (ref 1.9–3.7)
Glucose, Bld: 205 mg/dL — ABNORMAL HIGH (ref 65–99)
POTASSIUM: 4.6 mmol/L (ref 3.5–5.3)
SODIUM: 139 mmol/L (ref 135–146)
TOTAL PROTEIN: 6.4 g/dL (ref 6.1–8.1)
Total Bilirubin: 0.7 mg/dL (ref 0.2–1.2)

## 2017-05-04 LAB — HEMOGLOBIN A1C
Hgb A1c MFr Bld: 8.9 % of total Hgb — ABNORMAL HIGH (ref <5.7)
Mean Plasma Glucose: 209 (calc)
eAG (mmol/L): 11.6 (calc)

## 2017-05-10 ENCOUNTER — Ambulatory Visit: Payer: Medicaid Other | Admitting: "Endocrinology

## 2017-05-10 ENCOUNTER — Encounter: Payer: Self-pay | Admitting: "Endocrinology

## 2017-05-10 VITALS — BP 140/68 | HR 66 | Ht 72.0 in | Wt >= 6400 oz

## 2017-05-10 DIAGNOSIS — E782 Mixed hyperlipidemia: Secondary | ICD-10-CM | POA: Diagnosis not present

## 2017-05-10 DIAGNOSIS — E1165 Type 2 diabetes mellitus with hyperglycemia: Secondary | ICD-10-CM

## 2017-05-10 DIAGNOSIS — I10 Essential (primary) hypertension: Secondary | ICD-10-CM | POA: Diagnosis not present

## 2017-05-10 DIAGNOSIS — Z794 Long term (current) use of insulin: Secondary | ICD-10-CM

## 2017-05-10 DIAGNOSIS — E1151 Type 2 diabetes mellitus with diabetic peripheral angiopathy without gangrene: Secondary | ICD-10-CM

## 2017-05-10 DIAGNOSIS — IMO0002 Reserved for concepts with insufficient information to code with codable children: Secondary | ICD-10-CM

## 2017-05-10 MED ORDER — INSULIN REGULAR HUMAN (CONC) 500 UNIT/ML ~~LOC~~ SOPN: 90.0000 [IU] | PEN_INJECTOR | Freq: Three times a day (TID) | SUBCUTANEOUS | 2 refills | Status: DC

## 2017-05-10 NOTE — Progress Notes (Signed)
Subjective:    Patient ID: Aaron Potter, male    DOB: 1960-12-28, PCP Kari Baars, MD   Past Medical History:  Diagnosis Date  . Asthma   . Balanitis   . Cellulitis   . COPD (chronic obstructive pulmonary disease) (HCC)   . Diabetes mellitus without complication (HCC)   . Gout   . Hypertension   . Lichen sclerosus   . Obesity, morbid (more than 100 lbs over ideal weight or BMI > 40) (HCC)   . Sleep apnea   . Urticaria    Past Surgical History:  Procedure Laterality Date  . CHOLECYSTECTOMY     Social History   Socioeconomic History  . Marital status: Single    Spouse name: Not on file  . Number of children: Not on file  . Years of education: Not on file  . Highest education level: Not on file  Occupational History  . Not on file  Social Needs  . Financial resource strain: Not on file  . Food insecurity:    Worry: Not on file    Inability: Not on file  . Transportation needs:    Medical: Not on file    Non-medical: Not on file  Tobacco Use  . Smoking status: Never Smoker  . Smokeless tobacco: Never Used  Substance and Sexual Activity  . Alcohol use: No    Alcohol/week: 0.0 oz  . Drug use: No  . Sexual activity: Yes  Lifestyle  . Physical activity:    Days per week: Not on file    Minutes per session: Not on file  . Stress: Not on file  Relationships  . Social connections:    Talks on phone: Not on file    Gets together: Not on file    Attends religious service: Not on file    Active member of club or organization: Not on file    Attends meetings of clubs or organizations: Not on file    Relationship status: Not on file  Other Topics Concern  . Not on file  Social History Narrative  . Not on file   Outpatient Encounter Medications as of 05/10/2017  Medication Sig  . ACCU-CHEK AVIVA PLUS test strip USE TO CHECK BLOOD SUGAR FOUR TIMES A DAY.  Marland Kitchen albuterol (PROAIR HFA) 108 (90 BASE) MCG/ACT inhaler Inhale 2 puffs into the lungs every 4 (four)  hours as needed for wheezing or shortness of breath. Sometimes only uses one puff  . allopurinol (ZYLOPRIM) 300 MG tablet Take 300 mg by mouth daily.    Marland Kitchen ALPRAZolam (XANAX) 1 MG tablet Take 1 mg by mouth 4 (four) times daily.    . benazepril (LOTENSIN) 5 MG tablet TAKE 1 TABLET ONCE DAILY.  . benazepril (LOTENSIN) 5 MG tablet TAKE 1 TABLET ONCE DAILY.  Marland Kitchen BYDUREON BCISE 2 MG/0.85ML AUIJ INJECT 2MG  INTO THE SKIN ONCE A WEEK.  . diphenhydrAMINE (BENADRYL) 25 mg capsule Take 50 mg by mouth every 6 (six) hours as needed for itching.  . Exenatide ER 2 MG/0.85ML AUIJ Inject 2 mg into the skin once a week.  Marland Kitchen GLOBAL EASE INJECT PEN NEEDLES 31G X 8 MM MISC USE 5 TIMES DAILY.  Marland Kitchen HYDROcodone-acetaminophen (NORCO) 10-325 MG per tablet Take 1 tablet by mouth 4 (four) times daily.  . indomethacin (INDOCIN) 50 MG capsule Take 50 mg by mouth daily.   . insulin regular human CONCENTRATED (HUMULIN R U-500 KWIKPEN) 500 UNIT/ML kwikpen Inject 90 Units into the skin 3 (three)  times daily with meals.  . metFORMIN (GLUCOPHAGE) 500 MG tablet Take 1,000 mg by mouth 2 (two) times daily with a meal.   . Multiple Vitamin (MULTIVITAMIN WITH MINERALS) TABS tablet Take 1 tablet by mouth daily.  . pravastatin (PRAVACHOL) 40 MG tablet Take 40 mg by mouth daily.  Marland Kitchen tiotropium (SPIRIVA) 18 MCG inhalation capsule Place 18 mcg into inhaler and inhale daily.  . verapamil (CALAN-SR) 240 MG CR tablet Take 240 mg by mouth daily.  . Vitamin D, Ergocalciferol, (DRISDOL) 50000 units CAPS capsule TAKE 1 CAPSULE WEEKLY.  . zolpidem (AMBIEN) 10 MG tablet Take 10 mg by mouth at bedtime as needed for sleep.   . [DISCONTINUED] insulin regular human CONCENTRATED (HUMULIN R U-500 KWIKPEN) 500 UNIT/ML kwikpen Inject 80 Units into the skin 3 (three) times daily with meals.   No facility-administered encounter medications on file as of 05/10/2017.    ALLERGIES: Allergies  Allergen Reactions  . Biaxin [Clarithromycin] Anaphylaxis and Shortness Of  Breath  . Peanut-Containing Drug Products Anaphylaxis   VACCINATION STATUS:  There is no immunization history on file for this patient.  Diabetes  He presents for his follow-up diabetic visit. He has type 2 diabetes mellitus. Onset time: He was diagnosed at approximate age of 3 years. His disease course has been improving. There are no hypoglycemic associated symptoms. Pertinent negatives for hypoglycemia include no confusion, headaches, pallor or seizures. Associated symptoms include polydipsia and polyuria. Pertinent negatives for diabetes include no chest pain, no fatigue, no polyphagia and no weakness. There are no hypoglycemic complications. Symptoms are improving. Diabetic complications include PVD. Risk factors for coronary artery disease include dyslipidemia, diabetes mellitus, family history, male sex, obesity and sedentary lifestyle. Current diabetic treatment includes intensive insulin program. He is compliant with treatment most of the time. His weight is stable. He is following a generally unhealthy diet. He has had a previous visit with a dietitian. He never participates in exercise. His home blood glucose trend is increasing steadily. His breakfast blood glucose range is generally 180-200 mg/dl. His lunch blood glucose range is generally >200 mg/dl. His dinner blood glucose range is generally 180-200 mg/dl. His bedtime blood glucose range is generally >200 mg/dl. His overall blood glucose range is 180-200 mg/dl. An ACE inhibitor/angiotensin II receptor blocker is being taken.  Hyperlipidemia  This is a chronic problem. The current episode started more than 1 year ago. The problem is uncontrolled. Exacerbating diseases include diabetes and obesity. Pertinent negatives include no chest pain, myalgias or shortness of breath. Current antihyperlipidemic treatment includes statins. Risk factors for coronary artery disease include diabetes mellitus, dyslipidemia, hypertension, male sex, obesity  and a sedentary lifestyle.  Hypertension  This is a chronic problem. The current episode started more than 1 year ago. The problem is uncontrolled. Pertinent negatives include no chest pain, headaches, neck pain, palpitations or shortness of breath. Past treatments include ACE inhibitors and calcium channel blockers. The current treatment provides no improvement. Compliance problems include diet.  Hypertensive end-organ damage includes PVD.     Review of Systems  Constitutional: Negative for fatigue and unexpected weight change.  HENT: Negative for dental problem, mouth sores and trouble swallowing.   Eyes: Negative for visual disturbance.  Respiratory: Negative for cough, choking, chest tightness, shortness of breath and wheezing.   Cardiovascular: Negative for chest pain, palpitations and leg swelling.  Gastrointestinal: Negative for abdominal distention, abdominal pain, constipation, diarrhea, nausea and vomiting.  Endocrine: Positive for polydipsia and polyuria. Negative for polyphagia.  Genitourinary: Negative  for dysuria, flank pain, hematuria and urgency.  Musculoskeletal: Negative for back pain, gait problem, myalgias and neck pain.  Skin: Negative for pallor, rash and wound.  Neurological: Negative for seizures, syncope, weakness, numbness and headaches.  Psychiatric/Behavioral: Negative.  Negative for confusion and dysphoric mood.    Objective:    BP 140/68   Pulse 66   Ht 6' (1.829 m)   Wt (!) 431 lb (195.5 kg)   BMI 58.45 kg/m   Wt Readings from Last 3 Encounters:  05/10/17 (!) 431 lb (195.5 kg)  03/27/17 (!) 432 lb (196 kg)  03/13/17 (!) 431 lb (195.5 kg)    Physical Exam  Constitutional: He is oriented to person, place, and time. He appears well-developed and well-nourished. He is cooperative. No distress.  HENT:  Head: Normocephalic and atraumatic.  Eyes: EOM are normal.  Neck: Normal range of motion. Neck supple. No tracheal deviation present. No thyromegaly  present.  Cardiovascular: Normal rate, S1 normal and S2 normal. Exam reveals no gallop.  No murmur heard. Pulses:      Dorsalis pedis pulses are 0 on the right side, and 0 on the left side.       Posterior tibial pulses are 0 on the right side, and 0 on the left side.  Pulmonary/Chest: Effort normal. No respiratory distress. He has no wheezes.  Abdominal: He exhibits no distension. There is no tenderness. There is no guarding and no CVA tenderness.  Musculoskeletal: He exhibits no edema.       Right shoulder: He exhibits no swelling and no deformity.  Neurological: He is alert and oriented to person, place, and time. He has normal strength and normal reflexes. No cranial nerve deficit or sensory deficit. Gait normal.  Skin: Skin is warm and dry. No rash noted. No cyanosis. Nails show no clubbing.  Psychiatric: He has a normal mood and affect. His speech is normal. Judgment normal. Cognition and memory are normal.    Results for orders placed or performed in visit on 03/27/17  COMPLETE METABOLIC PANEL WITH GFR  Result Value Ref Range   Glucose, Bld 205 (H) 65 - 99 mg/dL   BUN 15 7 - 25 mg/dL   Creat 9.60 4.54 - 0.98 mg/dL   GFR, Est Non African American 100 > OR = 60 mL/min/1.42m2   GFR, Est African American 116 > OR = 60 mL/min/1.51m2   BUN/Creatinine Ratio NOT APPLICABLE 6 - 22 (calc)   Sodium 139 135 - 146 mmol/L   Potassium 4.6 3.5 - 5.3 mmol/L   Chloride 102 98 - 110 mmol/L   CO2 27 20 - 32 mmol/L   Calcium 9.4 8.6 - 10.3 mg/dL   Total Protein 6.4 6.1 - 8.1 g/dL   Albumin 3.9 3.6 - 5.1 g/dL   Globulin 2.5 1.9 - 3.7 g/dL (calc)   AG Ratio 1.6 1.0 - 2.5 (calc)   Total Bilirubin 0.7 0.2 - 1.2 mg/dL   Alkaline phosphatase (APISO) 70 40 - 115 U/L   AST 26 10 - 35 U/L   ALT 24 9 - 46 U/L  Hemoglobin A1c  Result Value Ref Range   Hgb A1c MFr Bld 8.9 (H) <5.7 % of total Hgb   Mean Plasma Glucose 209 (calc)   eAG (mmol/L) 11.6 (calc)   Diabetic Labs (most recent): Lab Results   Component Value Date   HGBA1C 8.9 (H) 05/03/2017   HGBA1C 9.6 (H) 01/18/2017   HGBA1C 9.2 (H) 10/15/2016   Lipid Panel  Component Value Date/Time   CHOL 163 10/15/2016 0933   TRIG 92 10/15/2016 0933   HDL 45 10/15/2016 0933   CHOLHDL 3.6 10/15/2016 0933   VLDL 31 (H) 03/12/2015 1200   LDLCALC 99 10/15/2016 0933     Assessment & Plan:   1. Uncontrolled type 2 diabetes mellitus with diabetic peripheral angiopathy without gangrene, with long-term current use of insulin (HCC) - He came with improved blood glucose profile, A1c at 8.9% improving from 9.6%.   He remains at a high risk for more acute and chronic complications of diabetes which include CAD, CVA, CKD, retinopathy, and neuropathy. These are all discussed in detail with the patient.   Glucose logs and insulin administration records pertaining to this visit,  to be scanned into patient's records.  Recent labs reviewed.  - I have re-counseled the patient on diet management and weight loss  by adopting a carbohydrate restricted / protein rich  Diet.  -He still admits to dietary indiscretion. -  Suggestion is made for him to avoid simple carbohydrates  from his diet including Cakes, Sweet Desserts / Pastries, Ice Cream, Soda (diet and regular), Sweet Tea, Candies, Chips, Cookies, Store Bought Juices, Alcohol in Excess of  1-2 drinks a day, Artificial Sweeteners, and "Sugar-free" Products. This will help patient to have stable blood glucose profile and potentially avoid unintended weight gain.   - Patient is advised to stick to a routine mealtimes to eat 3 meals  a day and avoid unnecessary snacks ( to snack only to correct hypoglycemia).  -He saw better result with Humulin RU500. -I discussed and increased his insulin U500 to 90  units 3 times daily before meals for pre-meal blood glucose readings of 90 or above milligrams per deciliter associated with strict monitoring of blood glucose 4 times a day-before meals and at  bedtime.   -Patient is encouraged to call clinic for blood glucose levels less than 70 or above 300 mg /dl. -I will continue Metformin 1gm   by mouth twice a day and continue Bydureon 2 mg subcutaneously  weekly.  - He is a perfect candidate for bariatric surgery, he has been hesitant to consider this option. -He is following with Norm Salt, CDE for DM education.  - Patient specific target  for A1c; LDL, HDL, Triglycerides, and  Waist Circumference were discussed in detail.  2) BP/HTN: His blood pressure is not controlled to target,  I advised him to continue current medications  including benazepril 5 mg p.o. daily.  He also has verapamil 240 mg p.o. Daily, for blood pressure treatment. 3) Lipids/HPL: Lipid panel uncontrolled with LDL of 99, he is advised to continue pravastatin 40 mg p.o. nightly.    4)  Weight/Diet:  He has had no significant success in weight control.  He still admits to dietary indiscretion.   CDE consult in progress, exercise, and carbohydrates information provided.  He hesitates to consider bariatric surgery.  5) Chronic Care/Health Maintenance:  -Patient  Is  on ACEI/ARB and Statin medications and encouraged to continue to follow up with Ophthalmology, Podiatrist at least yearly or according to recommendations, and advised to  stay away from smoking. I have recommended yearly flu vaccine and pneumonia vaccination at least every 5 years; and  sleep for at least 7 hours a day. -This patient cannot exercise optimally due to his heavy weight and comorbidities.  - Time spent with the patient: 25 min, of which >50% was spent in reviewing his blood glucose logs , discussing  his hypo- and hyper-glycemic episodes, reviewing his current and  previous labs and insulin doses and developing a plan to avoid hypo- and hyper-glycemia. Please refer to Patient Instructions for Blood Glucose Monitoring and Insulin/Medications Dosing Guide"  in media tab for additional  information. Aaron Potter participated in the discussions, expressed understanding, and voiced agreement with the above plans.  All questions were answered to his satisfaction. he is encouraged to contact clinic should he have any questions or concerns prior to his return visit.  - I advised patient to maintain close follow up with Kari Baars, MD for primary care needs.   Follow up plan: -Return in about 3 months (around 08/10/2017) for meter, and logs.  Marquis Lunch, MD Phone: [phone]  Fax: [phone]  -  This note was partially dictated with voice recognition software. Similar sounding words can be transcribed inadequately or may not  be corrected upon review.  05/10/2017, 12:10 PM

## 2017-05-10 NOTE — Patient Instructions (Signed)

## 2017-05-29 ENCOUNTER — Other Ambulatory Visit: Payer: Self-pay | Admitting: "Endocrinology

## 2017-06-03 ENCOUNTER — Other Ambulatory Visit: Payer: Self-pay | Admitting: "Endocrinology

## 2017-08-04 LAB — HEMOGLOBIN A1C
EAG (MMOL/L): 10.1 (calc)
HEMOGLOBIN A1C: 8 %{Hb} — AB (ref <5.7)
MEAN PLASMA GLUCOSE: 183 (calc)

## 2017-08-04 LAB — COMPLETE METABOLIC PANEL WITH GFR
AG Ratio: 1.6 (calc) (ref 1.0–2.5)
ALKALINE PHOSPHATASE (APISO): 69 U/L (ref 40–115)
ALT: 22 U/L (ref 9–46)
AST: 21 U/L (ref 10–35)
Albumin: 4.1 g/dL (ref 3.6–5.1)
BUN: 14 mg/dL (ref 7–25)
CALCIUM: 9.6 mg/dL (ref 8.6–10.3)
CO2: 25 mmol/L (ref 20–32)
Chloride: 103 mmol/L (ref 98–110)
Creat: 0.8 mg/dL (ref 0.70–1.33)
GFR, Est African American: 117 mL/min/{1.73_m2} (ref >=60)
GFR, Est Non African American: 101 mL/min/{1.73_m2} (ref >=60)
GLOBULIN: 2.5 g/dL (ref 1.9–3.7)
GLUCOSE: 177 mg/dL — AB (ref 65–99)
Potassium: 4.4 mmol/L (ref 3.5–5.3)
SODIUM: 139 mmol/L (ref 135–146)
Total Bilirubin: 0.4 mg/dL (ref 0.2–1.2)
Total Protein: 6.6 g/dL (ref 6.1–8.1)

## 2017-08-10 ENCOUNTER — Encounter: Payer: Self-pay | Admitting: "Endocrinology

## 2017-08-10 ENCOUNTER — Ambulatory Visit: Payer: Medicaid Other | Admitting: "Endocrinology

## 2017-08-10 VITALS — BP 153/73 | HR 77 | Ht 72.0 in | Wt >= 6400 oz

## 2017-08-10 DIAGNOSIS — E782 Mixed hyperlipidemia: Secondary | ICD-10-CM

## 2017-08-10 DIAGNOSIS — E1165 Type 2 diabetes mellitus with hyperglycemia: Secondary | ICD-10-CM

## 2017-08-10 DIAGNOSIS — I10 Essential (primary) hypertension: Secondary | ICD-10-CM | POA: Diagnosis not present

## 2017-08-10 DIAGNOSIS — E1151 Type 2 diabetes mellitus with diabetic peripheral angiopathy without gangrene: Secondary | ICD-10-CM

## 2017-08-10 DIAGNOSIS — IMO0002 Reserved for concepts with insufficient information to code with codable children: Secondary | ICD-10-CM

## 2017-08-10 DIAGNOSIS — Z794 Long term (current) use of insulin: Secondary | ICD-10-CM

## 2017-08-10 MED ORDER — BENAZEPRIL HCL 10 MG PO TABS: 10.0000 mg | ORAL_TABLET | Freq: Every day | ORAL | 3 refills | Status: DC

## 2017-08-10 NOTE — Progress Notes (Signed)
Endocrinology follow-up note  Subjective:    Patient ID: Aaron Potter, male    DOB: 12/17/1960, PCP Kari Baars, MD   Past Medical History:  Diagnosis Date  . Asthma   . Balanitis   . Cellulitis   . COPD (chronic obstructive pulmonary disease) (HCC)   . Diabetes mellitus without complication (HCC)   . Gout   . Hypertension   . Lichen sclerosus   . Obesity, morbid (more than 100 lbs over ideal weight or BMI > 40) (HCC)   . Sleep apnea   . Urticaria    Past Surgical History:  Procedure Laterality Date  . CHOLECYSTECTOMY     Social History   Socioeconomic History  . Marital status: Single    Spouse name: Not on file  . Number of children: Not on file  . Years of education: Not on file  . Highest education level: Not on file  Occupational History  . Not on file  Social Needs  . Financial resource strain: Not on file  . Food insecurity:    Worry: Not on file    Inability: Not on file  . Transportation needs:    Medical: Not on file    Non-medical: Not on file  Tobacco Use  . Smoking status: Never Smoker  . Smokeless tobacco: Never Used  Substance and Sexual Activity  . Alcohol use: No    Alcohol/week: 0.0 standard drinks  . Drug use: No  . Sexual activity: Yes  Lifestyle  . Physical activity:    Days per week: Not on file    Minutes per session: Not on file  . Stress: Not on file  Relationships  . Social connections:    Talks on phone: Not on file    Gets together: Not on file    Attends religious service: Not on file    Active member of club or organization: Not on file    Attends meetings of clubs or organizations: Not on file    Relationship status: Not on file  Other Topics Concern  . Not on file  Social History Narrative  . Not on file   Outpatient Encounter Medications as of 08/10/2017  Medication Sig  . gabapentin (NEURONTIN) 300 MG capsule Take 300 mg by mouth 2 (two) times daily.  Marland Kitchen ACCU-CHEK AVIVA PLUS test strip USE TO CHECK BLOOD  SUGAR 4 TIMES DAILY.  Marland Kitchen albuterol (PROAIR HFA) 108 (90 BASE) MCG/ACT inhaler Inhale 2 puffs into the lungs every 4 (four) hours as needed for wheezing or shortness of breath. Sometimes only uses one puff  . allopurinol (ZYLOPRIM) 300 MG tablet Take 300 mg by mouth daily.    Marland Kitchen ALPRAZolam (XANAX) 1 MG tablet Take 1 mg by mouth 4 (four) times daily.    . benazepril (LOTENSIN) 5 MG tablet TAKE 1 TABLET ONCE DAILY.  Marland Kitchen BYDUREON BCISE 2 MG/0.85ML AUIJ INJECT 2MG  INTO THE SKIN ONCE A WEEK.  . diphenhydrAMINE (BENADRYL) 25 mg capsule Take 50 mg by mouth every 6 (six) hours as needed for itching.  . Exenatide ER 2 MG/0.85ML AUIJ Inject 2 mg into the skin once a week.  Marland Kitchen GLOBAL EASE INJECT PEN NEEDLES 31G X 8 MM MISC USE 5 TIMES DAILY.  Marland Kitchen HYDROcodone-acetaminophen (NORCO) 10-325 MG per tablet Take 1 tablet by mouth 4 (four) times daily.  . indomethacin (INDOCIN) 50 MG capsule Take 50 mg by mouth daily.   . insulin regular human CONCENTRATED (HUMULIN R U-500 KWIKPEN) 500 UNIT/ML kwikpen Inject 90  Units into the skin 3 (three) times daily with meals.  . metFORMIN (GLUCOPHAGE) 500 MG tablet Take 1,000 mg by mouth 2 (two) times daily with a meal.   . Multiple Vitamin (MULTIVITAMIN WITH MINERALS) TABS tablet Take 1 tablet by mouth daily.  . pravastatin (PRAVACHOL) 40 MG tablet Take 40 mg by mouth daily.  Marland Kitchen tiotropium (SPIRIVA) 18 MCG inhalation capsule Place 18 mcg into inhaler and inhale daily.  . verapamil (CALAN-SR) 240 MG CR tablet Take 240 mg by mouth daily.  . Vitamin D, Ergocalciferol, (DRISDOL) 50000 units CAPS capsule TAKE 1 CAPSULE BY MOUTH ONCE A WEEK.  Marland Kitchen zolpidem (AMBIEN) 10 MG tablet Take 10 mg by mouth at bedtime as needed for sleep.   . [DISCONTINUED] ACCU-CHEK AVIVA PLUS test strip USE TO CHECK BLOOD SUGAR FOUR TIMES A DAY.   No facility-administered encounter medications on file as of 08/10/2017.    ALLERGIES: Allergies  Allergen Reactions  . Biaxin [Clarithromycin] Anaphylaxis and Shortness  Of Breath  . Peanut-Containing Drug Products Anaphylaxis   VACCINATION STATUS:  There is no immunization history on file for this patient.  Diabetes  He presents for his follow-up diabetic visit. He has type 2 diabetes mellitus. Onset time: He was diagnosed at approximate age of 67 years. His disease course has been improving. There are no hypoglycemic associated symptoms. Pertinent negatives for hypoglycemia include no confusion, headaches, pallor or seizures. Associated symptoms include polydipsia and polyuria. Pertinent negatives for diabetes include no chest pain, no fatigue, no polyphagia and no weakness. There are no hypoglycemic complications. Symptoms are improving. Diabetic complications include PVD. Risk factors for coronary artery disease include dyslipidemia, diabetes mellitus, family history, male sex, obesity and sedentary lifestyle. Current diabetic treatment includes intensive insulin program. He is compliant with treatment most of the time. His weight is increasing steadily. He is following a generally unhealthy diet. He has had a previous visit with a dietitian. He never participates in exercise. His home blood glucose trend is increasing steadily. His breakfast blood glucose range is generally 180-200 mg/dl. His lunch blood glucose range is generally 140-180 mg/dl. His dinner blood glucose range is generally 140-180 mg/dl. His bedtime blood glucose range is generally 180-200 mg/dl. His overall blood glucose range is 180-200 mg/dl. An ACE inhibitor/angiotensin II receptor blocker is being taken.  Hyperlipidemia  This is a chronic problem. The current episode started more than 1 year ago. The problem is uncontrolled. Exacerbating diseases include diabetes and obesity. Pertinent negatives include no chest pain, myalgias or shortness of breath. Current antihyperlipidemic treatment includes statins. Risk factors for coronary artery disease include diabetes mellitus, dyslipidemia,  hypertension, male sex, obesity and a sedentary lifestyle.  Hypertension  This is a chronic problem. The current episode started more than 1 year ago. The problem is uncontrolled. Pertinent negatives include no chest pain, headaches, neck pain, palpitations or shortness of breath. Past treatments include ACE inhibitors and calcium channel blockers. The current treatment provides no improvement. Compliance problems include diet.  Hypertensive end-organ damage includes PVD.     Review of Systems  Constitutional: Negative for fatigue and unexpected weight change.  HENT: Negative for dental problem, mouth sores and trouble swallowing.   Eyes: Negative for visual disturbance.  Respiratory: Negative for cough, choking, chest tightness, shortness of breath and wheezing.   Cardiovascular: Negative for chest pain, palpitations and leg swelling.  Gastrointestinal: Negative for abdominal distention, abdominal pain, constipation, diarrhea, nausea and vomiting.  Endocrine: Positive for polydipsia and polyuria. Negative for polyphagia.  Genitourinary: Negative for dysuria, flank pain, hematuria and urgency.  Musculoskeletal: Negative for back pain, gait problem, myalgias and neck pain.  Skin: Negative for pallor, rash and wound.  Neurological: Negative for seizures, syncope, weakness, numbness and headaches.  Psychiatric/Behavioral: Negative.  Negative for confusion and dysphoric mood.    Objective:    BP (!) 153/73   Pulse 77   Ht 6' (1.829 m)   Wt (!) 436 lb (197.8 kg)   BMI 59.13 kg/m   Wt Readings from Last 3 Encounters:  08/10/17 (!) 436 lb (197.8 kg)  05/10/17 (!) 431 lb (195.5 kg)  03/27/17 (!) 432 lb (196 kg)    Physical Exam  Constitutional: He is oriented to person, place, and time. He appears well-developed and well-nourished. He is cooperative. No distress.  HENT:  Head: Normocephalic and atraumatic.  Eyes: EOM are normal.  Neck: Normal range of motion. Neck supple. No tracheal  deviation present. No thyromegaly present.  Cardiovascular: Normal rate, S1 normal and S2 normal. Exam reveals no gallop.  No murmur heard. Pulses:      Dorsalis pedis pulses are 0 on the right side, and 0 on the left side.       Posterior tibial pulses are 0 on the right side, and 0 on the left side.  Pulmonary/Chest: Effort normal. No respiratory distress. He has no wheezes.  Abdominal: He exhibits no distension. There is no tenderness. There is no guarding and no CVA tenderness.  Musculoskeletal: He exhibits no edema.       Right shoulder: He exhibits no swelling and no deformity.  Neurological: He is alert and oriented to person, place, and time. He has normal strength. No cranial nerve deficit or sensory deficit. Gait normal.  Skin: Skin is warm and dry. No rash noted. No cyanosis. Nails show no clubbing.  Psychiatric: He has a normal mood and affect. His speech is normal. Judgment normal. Cognition and memory are normal.    Results for orders placed or performed in visit on 05/10/17  COMPLETE METABOLIC PANEL WITH GFR  Result Value Ref Range   Glucose, Bld 177 (H) 65 - 99 mg/dL   BUN 14 7 - 25 mg/dL   Creat 1.61 0.96 - 0.45 mg/dL   GFR, Est Non African American 101 > OR = 60 mL/min/1.94m2   GFR, Est African American 117 > OR = 60 mL/min/1.38m2   BUN/Creatinine Ratio NOT APPLICABLE 6 - 22 (calc)   Sodium 139 135 - 146 mmol/L   Potassium 4.4 3.5 - 5.3 mmol/L   Chloride 103 98 - 110 mmol/L   CO2 25 20 - 32 mmol/L   Calcium 9.6 8.6 - 10.3 mg/dL   Total Protein 6.6 6.1 - 8.1 g/dL   Albumin 4.1 3.6 - 5.1 g/dL   Globulin 2.5 1.9 - 3.7 g/dL (calc)   AG Ratio 1.6 1.0 - 2.5 (calc)   Total Bilirubin 0.4 0.2 - 1.2 mg/dL   Alkaline phosphatase (APISO) 69 40 - 115 U/L   AST 21 10 - 35 U/L   ALT 22 9 - 46 U/L  Hemoglobin A1c  Result Value Ref Range   Hgb A1c MFr Bld 8.0 (H) <5.7 % of total Hgb   Mean Plasma Glucose 183 (calc)   eAG (mmol/L) 10.1 (calc)   Diabetic Labs (most  recent): Lab Results  Component Value Date   HGBA1C 8.0 (H) 08/03/2017   HGBA1C 8.9 (H) 05/03/2017   HGBA1C 9.6 (H) 01/18/2017   Lipid Panel  Component Value Date/Time   CHOL 163 10/15/2016 0933   TRIG 92 10/15/2016 0933   HDL 45 10/15/2016 0933   CHOLHDL 3.6 10/15/2016 0933   VLDL 31 (H) 03/12/2015 1200   LDLCALC 99 10/15/2016 0933     Assessment & Plan:   1. Uncontrolled type 2 diabetes mellitus with diabetic peripheral angiopathy without gangrene, with long-term current use of insulin (HCC) - He has responded favorably to insulin U500.  He came with improved glycemic profile and A1c of 8%, generally improving from 9.6%.    He remains at a high risk for more acute and chronic complications of diabetes which include CAD, CVA, CKD, retinopathy, and neuropathy. These are all discussed in detail with the patient.   Glucose logs and insulin administration records pertaining to this visit,  to be scanned into patient's records.  Recent labs reviewed.  - I have re-counseled the patient on diet management and weight loss  by adopting a carbohydrate restricted / protein rich  Diet.  -He still admits to dietary indiscretion.  -  Suggestion is made for him to avoid simple carbohydrates  from his diet including Cakes, Sweet Desserts / Pastries, Ice Cream, Soda (diet and regular), Sweet Tea, Candies, Chips, Cookies, Store Bought Juices, Alcohol in Excess of  1-2 drinks a day, Artificial Sweeteners, and "Sugar-free" Products. This will help patient to have stable blood glucose profile and potentially avoid unintended weight gain.  - Patient is advised to stick to a routine mealtimes to eat 3 meals  a day and avoid unnecessary snacks ( to snack only to correct hypoglycemia).  -He saw better results with Humulin RU500. -I discussed and continued his insulin Humulin U500 at  90  units 3 times daily before meals for pre-meal blood glucose readings of 90 or above milligrams per deciliter  associated with strict monitoring of blood glucose 4 times a day-before meals and at bedtime.   -Patient is encouraged to call clinic for blood glucose levels less than 70 or above 300 mg /dl. -I will continue Metformin 1gm   by mouth twice a day and continue Bydureon 2 mg subcutaneously  weekly.  - He is a perfect candidate for bariatric surgery, he has been hesitant to consider this option. -He is following with Norm Salt, CDE for DM education.  - Patient specific target  for A1c; LDL, HDL, Triglycerides, and  Waist Circumference were discussed in detail.  2) BP/HTN: His blood pressure is not controlled to target.  I will proceed to increase his benazepril to 10 mg p.o. daily, also has verapamil 240 mg p.o. daily for blood pressure treatment.   3) Lipids/HPL: His recent lipid panel uncontrolled with LDL of 99.  He is advised to continue pravastatin 40 mg p.o. Nightly.  4)  Weight/Diet:  He has had no significant success in weight control.  He still admits to dietary indiscretion.   CDE consult in progress, exercise, and carbohydrates information provided.  He hesitates to consider bariatric surgery.  5) Chronic Care/Health Maintenance:  -Patient  is  on ACEI/ARB and Statin medications and encouraged to continue to follow up with Ophthalmology, Podiatrist at least yearly or according to recommendations, and advised to  stay away from smoking. I have recommended yearly flu vaccine and pneumonia vaccination at least every 5 years; and  sleep for at least 7 hours a day. -This patient cannot exercise optimally due to his heavy weight and comorbidities.  - I advised patient to maintain close follow up with  Kari BaarsHawkins, Edward, MD for primary care needs.   - Time spent with the patient: 25 min, of which >50% was spent in reviewing his blood glucose logs , discussing his hypo- and hyper-glycemic episodes, reviewing his current and  previous labs and insulin doses and developing a plan to avoid  hypo- and hyper-glycemia. Please refer to Patient Instructions for Blood Glucose Monitoring and Insulin/Medications Dosing Guide"  in media tab for additional information. Wilber BihariGeorge Lundy participated in the discussions, expressed understanding, and voiced agreement with the above plans.  All questions were answered to his satisfaction. he is encouraged to contact clinic should he have any questions or concerns prior to his return visit.   Follow up plan: -Return in about 3 months (around 11/10/2017) for Meter, and Logs.  Marquis LunchGebre Demareon Coldwell, MD Phone: [phone]  Fax: [phone]  -  This note was partially dictated with voice recognition software. Similar sounding words can be transcribed inadequately or may not  be corrected upon review.  08/10/2017, 10:32 AM

## 2017-08-10 NOTE — Patient Instructions (Signed)

## 2017-08-11 ENCOUNTER — Other Ambulatory Visit: Payer: Self-pay | Admitting: "Endocrinology

## 2017-08-29 ENCOUNTER — Other Ambulatory Visit: Payer: Self-pay | Admitting: "Endocrinology

## 2017-10-10 ENCOUNTER — Telehealth: Payer: Self-pay | Admitting: "Endocrinology

## 2017-10-10 NOTE — Telephone Encounter (Signed)
Aaron Potter is calling stating that his BYDUREON BCISE 2 MG/0.85ML AUIJ  Needs pre authorization please advise?

## 2017-10-10 NOTE — Telephone Encounter (Signed)
Spoke to pt regarding the PA. Medicaid is stating that his insurance is not active at this time. He states that he will call about this and we will attempt to resubmit PA

## 2017-10-18 ENCOUNTER — Encounter: Payer: Self-pay | Admitting: Family Medicine

## 2017-10-23 ENCOUNTER — Other Ambulatory Visit: Payer: Self-pay | Admitting: "Endocrinology

## 2017-10-24 ENCOUNTER — Other Ambulatory Visit: Payer: Self-pay | Admitting: "Endocrinology

## 2017-10-24 ENCOUNTER — Other Ambulatory Visit: Payer: Self-pay

## 2017-10-24 MED ORDER — EXENATIDE ER 2 MG/0.85ML ~~LOC~~ AUIJ: 2.0000 mg | AUTO-INJECTOR | SUBCUTANEOUS | 3 refills | Status: DC

## 2017-11-04 LAB — COMPLETE METABOLIC PANEL WITH GFR
AG Ratio: 1.7 (calc) (ref 1.0–2.5)
ALBUMIN MSPROF: 4 g/dL (ref 3.6–5.1)
ALT: 27 U/L (ref 9–46)
AST: 25 U/L (ref 10–35)
Alkaline phosphatase (APISO): 75 U/L (ref 40–115)
BILIRUBIN TOTAL: 0.4 mg/dL (ref 0.2–1.2)
BUN: 10 mg/dL (ref 7–25)
CHLORIDE: 101 mmol/L (ref 98–110)
CO2: 29 mmol/L (ref 20–32)
Calcium: 9.4 mg/dL (ref 8.6–10.3)
Creat: 0.82 mg/dL (ref 0.70–1.33)
GFR, Est African American: 116 mL/min/{1.73_m2} (ref >=60)
GFR, Est Non African American: 100 mL/min/{1.73_m2} (ref >=60)
GLOBULIN: 2.4 g/dL (ref 1.9–3.7)
Glucose, Bld: 193 mg/dL — ABNORMAL HIGH (ref 65–99)
POTASSIUM: 4.7 mmol/L (ref 3.5–5.3)
SODIUM: 139 mmol/L (ref 135–146)
Total Protein: 6.4 g/dL (ref 6.1–8.1)

## 2017-11-04 LAB — HEMOGLOBIN A1C
HEMOGLOBIN A1C: 9.3 %{Hb} — AB (ref <5.7)
MEAN PLASMA GLUCOSE: 220 (calc)
eAG (mmol/L): 12.2 (calc)

## 2017-11-10 ENCOUNTER — Ambulatory Visit: Payer: Medicaid Other | Admitting: "Endocrinology

## 2017-11-10 ENCOUNTER — Encounter: Payer: Self-pay | Admitting: "Endocrinology

## 2017-11-10 VITALS — BP 142/74 | HR 73 | Ht 72.0 in | Wt >= 6400 oz

## 2017-11-10 DIAGNOSIS — I10 Essential (primary) hypertension: Secondary | ICD-10-CM | POA: Diagnosis not present

## 2017-11-10 DIAGNOSIS — E1151 Type 2 diabetes mellitus with diabetic peripheral angiopathy without gangrene: Secondary | ICD-10-CM | POA: Diagnosis not present

## 2017-11-10 DIAGNOSIS — Z794 Long term (current) use of insulin: Secondary | ICD-10-CM

## 2017-11-10 DIAGNOSIS — E1165 Type 2 diabetes mellitus with hyperglycemia: Secondary | ICD-10-CM

## 2017-11-10 DIAGNOSIS — E782 Mixed hyperlipidemia: Secondary | ICD-10-CM | POA: Diagnosis not present

## 2017-11-10 DIAGNOSIS — R635 Abnormal weight gain: Secondary | ICD-10-CM

## 2017-11-10 DIAGNOSIS — IMO0002 Reserved for concepts with insufficient information to code with codable children: Secondary | ICD-10-CM

## 2017-11-10 DIAGNOSIS — E559 Vitamin D deficiency, unspecified: Secondary | ICD-10-CM | POA: Insufficient documentation

## 2017-11-10 MED ORDER — INSULIN REGULAR HUMAN (CONC) 500 UNIT/ML ~~LOC~~ SOPN: 100.0000 [IU] | PEN_INJECTOR | Freq: Three times a day (TID) | SUBCUTANEOUS | 3 refills | Status: DC

## 2017-11-10 NOTE — Progress Notes (Signed)
Endocrinology follow-up note  Subjective:    Patient ID: Aaron Potter, male    DOB: [birth year], PCP Kari Baars, MD   Past Medical History:  Diagnosis Date  . Asthma   . Balanitis   . Cellulitis   . COPD (chronic obstructive pulmonary disease) (HCC)   . Diabetes mellitus without complication (HCC)   . Gout   . Hypertension   . Lichen sclerosus   . Obesity, morbid (more than 100 lbs over ideal weight or BMI > 40) (HCC)   . Sleep apnea   . Urticaria    Past Surgical History:  Procedure Laterality Date  . CHOLECYSTECTOMY     Social History   Socioeconomic History  . Marital status: Single    Spouse name: Not on file  . Number of children: Not on file  . Years of education: Not on file  . Highest education level: Not on file  Occupational History  . Not on file  Social Needs  . Financial resource strain: Not on file  . Food insecurity:    Worry: Not on file    Inability: Not on file  . Transportation needs:    Medical: Not on file    Non-medical: Not on file  Tobacco Use  . Smoking status: Never Smoker  . Smokeless tobacco: Never Used  Substance and Sexual Activity  . Alcohol use: No    Alcohol/week: 0.0 standard drinks  . Drug use: No  . Sexual activity: Yes  Lifestyle  . Physical activity:    Days per week: Not on file    Minutes per session: Not on file  . Stress: Not on file  Relationships  . Social connections:    Talks on phone: Not on file    Gets together: Not on file    Attends religious service: Not on file    Active member of club or organization: Not on file    Attends meetings of clubs or organizations: Not on file    Relationship status: Not on file  Other Topics Concern  . Not on file  Social History Narrative  . Not on file   Outpatient Encounter Medications as of 11/10/2017  Medication Sig  . ACCU-CHEK AVIVA PLUS test strip USE TO CHECK BLOOD SUGAR 4 TIMES DAILY.  Marland Kitchen albuterol (PROAIR HFA) 108 (90 BASE) MCG/ACT inhaler Inhale 2  puffs into the lungs every 4 (four) hours as needed for wheezing or shortness of breath. Sometimes only uses one puff  . allopurinol (ZYLOPRIM) 300 MG tablet Take 300 mg by mouth daily.    Marland Kitchen ALPRAZolam (XANAX) 1 MG tablet Take 1 mg by mouth 4 (four) times daily.    . benazepril (LOTENSIN) 10 MG tablet Take 1 tablet (10 mg total) by mouth daily.  . benazepril (LOTENSIN) 5 MG tablet TAKE 1 TABLET DAILY.  . diphenhydrAMINE (BENADRYL) 25 mg capsule Take 50 mg by mouth every 6 (six) hours as needed for itching.  . Exenatide ER (BYDUREON BCISE) 2 MG/0.85ML AUIJ Inject 2 mg into the skin once a week.  Marland Kitchen GLOBAL EASE INJECT PEN NEEDLES 31G X 8 MM MISC USE 5 TIMES DAILY.  Marland Kitchen HYDROcodone-acetaminophen (NORCO) 10-325 MG per tablet Take 1 tablet by mouth 4 (four) times daily.  . indomethacin (INDOCIN) 50 MG capsule Take 50 mg by mouth daily.   . insulin regular human CONCENTRATED (HUMULIN R U-500 KWIKPEN) 500 UNIT/ML kwikpen Inject 100 Units into the skin 3 (three) times daily with meals.  . metFORMIN (GLUCOPHAGE)  500 MG tablet Take 1,000 mg by mouth 2 (two) times daily with a meal.   . Multiple Vitamin (MULTIVITAMIN WITH MINERALS) TABS tablet Take 1 tablet by mouth daily.  . pravastatin (PRAVACHOL) 40 MG tablet Take 40 mg by mouth daily.  Marland Kitchen tiotropium (SPIRIVA) 18 MCG inhalation capsule Place 18 mcg into inhaler and inhale daily.  . verapamil (CALAN-SR) 240 MG CR tablet Take 240 mg by mouth daily.  . Vitamin D, Ergocalciferol, (DRISDOL) 50000 units CAPS capsule TAKE 1 CAPSULE BY MOUTH ONCE A WEEK.  Marland Kitchen zolpidem (AMBIEN) 10 MG tablet Take 10 mg by mouth at bedtime as needed for sleep.   . [DISCONTINUED] Exenatide ER 2 MG/0.85ML AUIJ Inject 2 mg into the skin once a week.  . [DISCONTINUED] gabapentin (NEURONTIN) 300 MG capsule Take 300 mg by mouth 2 (two) times daily.  . [DISCONTINUED] HUMULIN R U-500 KWIKPEN 500 UNIT/ML kwikpen INJECT 90 UNITS SUBQ 3 TIMES A DAY WITH MEALS-- TO REPLACE LANTUS & NOVOLOG.  .  [DISCONTINUED] HUMULIN R U-500 KWIKPEN 500 UNIT/ML kwikpen INJECT 90 UNITS SUBQ 3 TIMES A DAY WITH MEALS-- TO REPLACE LANTUS & NOVOLOG.   No facility-administered encounter medications on file as of 11/10/2017.    ALLERGIES: Allergies  Allergen Reactions  . Biaxin [Clarithromycin] Anaphylaxis and Shortness Of Breath  . Peanut-Containing Drug Products Anaphylaxis   VACCINATION STATUS:  There is no immunization history on file for this patient.  Diabetes  He presents for his follow-up diabetic visit. He has type 2 diabetes mellitus. Onset time: He was diagnosed at approximate age of 46 years. His disease course has been worsening. There are no hypoglycemic associated symptoms. Pertinent negatives for hypoglycemia include no confusion, headaches, pallor or seizures. Associated symptoms include polydipsia and polyuria. Pertinent negatives for diabetes include no chest pain, no fatigue, no polyphagia and no weakness. There are no hypoglycemic complications. Symptoms are worsening. Diabetic complications include PVD. Risk factors for coronary artery disease include dyslipidemia, diabetes mellitus, family history, male sex, obesity and sedentary lifestyle. Current diabetic treatment includes intensive insulin program. He is compliant with treatment most of the time. His weight is increasing steadily. He is following a generally unhealthy diet. He has had a previous visit with a dietitian. He never participates in exercise. His home blood glucose trend is increasing steadily. His breakfast blood glucose range is generally >200 mg/dl. His lunch blood glucose range is generally >200 mg/dl. His dinner blood glucose range is generally >200 mg/dl. His bedtime blood glucose range is generally >200 mg/dl. His overall blood glucose range is >200 mg/dl. An ACE inhibitor/angiotensin II receptor blocker is being taken.  Hyperlipidemia  This is a chronic problem. The current episode started more than 1 year ago. The  problem is uncontrolled. Exacerbating diseases include diabetes and obesity. Pertinent negatives include no chest pain, myalgias or shortness of breath. Current antihyperlipidemic treatment includes statins. Risk factors for coronary artery disease include diabetes mellitus, dyslipidemia, hypertension, male sex, obesity and a sedentary lifestyle.  Hypertension  This is a chronic problem. The current episode started more than 1 year ago. The problem is uncontrolled. Pertinent negatives include no chest pain, headaches, neck pain, palpitations or shortness of breath. Past treatments include ACE inhibitors and calcium channel blockers. The current treatment provides no improvement. Compliance problems include diet.  Hypertensive end-organ damage includes PVD.    Review of Systems  Constitutional: Negative for fatigue and unexpected weight change.  HENT: Negative for dental problem, mouth sores and trouble swallowing.   Eyes:  Negative for visual disturbance.  Respiratory: Negative for cough, choking, chest tightness, shortness of breath and wheezing.   Cardiovascular: Negative for chest pain, palpitations and leg swelling.  Gastrointestinal: Negative for abdominal distention, abdominal pain, constipation, diarrhea, nausea and vomiting.  Endocrine: Positive for polydipsia and polyuria. Negative for polyphagia.  Genitourinary: Negative for dysuria, flank pain, hematuria and urgency.  Musculoskeletal: Negative for back pain, gait problem, myalgias and neck pain.  Skin: Negative for pallor, rash and wound.  Neurological: Negative for seizures, syncope, weakness, numbness and headaches.  Psychiatric/Behavioral: Negative.  Negative for confusion and dysphoric mood.    Objective:    BP (!) 142/74   Pulse 73   Ht 6' (1.829 m)   Wt (!) 437 lb (198.2 kg)   BMI 59.27 kg/m   Wt Readings from Last 3 Encounters:  11/10/17 (!) 437 lb (198.2 kg)  08/10/17 (!) 436 lb (197.8 kg)  05/10/17 (!) 431 lb (195.5  kg)    Physical Exam  Constitutional: He is oriented to person, place, and time. He appears well-developed and well-nourished. He is cooperative. No distress.  HENT:  Head: Normocephalic and atraumatic.  Eyes: EOM are normal.  Neck: Normal range of motion. Neck supple. No tracheal deviation present. No thyromegaly present.  Cardiovascular: Normal rate, S1 normal and S2 normal. Exam reveals no gallop.  No murmur heard. Pulses:      Dorsalis pedis pulses are 0 on the right side, and 0 on the left side.       Posterior tibial pulses are 0 on the right side, and 0 on the left side.  Pulmonary/Chest: Effort normal. No respiratory distress. He has no wheezes.  Abdominal: He exhibits no distension. There is no tenderness. There is no guarding and no CVA tenderness.  Musculoskeletal: He exhibits no edema.       Right shoulder: He exhibits no swelling and no deformity.  Neurological: He is alert and oriented to person, place, and time. He has normal strength. No cranial nerve deficit or sensory deficit. Gait normal.  Skin: Skin is warm and dry. No rash noted. No cyanosis. Nails show no clubbing.  Psychiatric: He has a normal mood and affect. His speech is normal. Judgment normal. Cognition and memory are normal.    Results for orders placed or performed in visit on 08/10/17  Hemoglobin A1c  Result Value Ref Range   Hgb A1c MFr Bld 9.3 (H) <5.7 % of total Hgb   Mean Plasma Glucose 220 (calc)   eAG (mmol/L) 12.2 (calc)  COMPLETE METABOLIC PANEL WITH GFR  Result Value Ref Range   Glucose, Bld 193 (H) 65 - 99 mg/dL   BUN 10 7 - 25 mg/dL   Creat 8.11 9.14 - 7.82 mg/dL   GFR, Est Non African American 100 > OR = 60 mL/min/1.37m2   GFR, Est African American 116 > OR = 60 mL/min/1.80m2   BUN/Creatinine Ratio NOT APPLICABLE 6 - 22 (calc)   Sodium 139 135 - 146 mmol/L   Potassium 4.7 3.5 - 5.3 mmol/L   Chloride 101 98 - 110 mmol/L   CO2 29 20 - 32 mmol/L   Calcium 9.4 8.6 - 10.3 mg/dL   Total  Protein 6.4 6.1 - 8.1 g/dL   Albumin 4.0 3.6 - 5.1 g/dL   Globulin 2.4 1.9 - 3.7 g/dL (calc)   AG Ratio 1.7 1.0 - 2.5 (calc)   Total Bilirubin 0.4 0.2 - 1.2 mg/dL   Alkaline phosphatase (APISO) 75 40 - 115 U/L  AST 25 10 - 35 U/L   ALT 27 9 - 46 U/L   Diabetic Labs (most recent): Lab Results  Component Value Date   HGBA1C 9.3 (H) 11/03/2017   HGBA1C 8.0 (H) 08/03/2017   HGBA1C 8.9 (H) 05/03/2017   Lipid Panel     Component Value Date/Time   CHOL 163 10/15/2016 0933   TRIG 92 10/15/2016 0933   HDL 45 10/15/2016 0933   CHOLHDL 3.6 10/15/2016 0933   VLDL 31 (H) 03/12/2015 1200   LDLCALC 99 10/15/2016 0933     Assessment & Plan:   1. Uncontrolled type 2 diabetes mellitus with diabetic peripheral angiopathy without gangrene, with long-term current use of insulin (HCC) - He has responded favorably to insulin U500, however came back with higher A1c of 9.3% largely due to he did not have Bydureon for at least 90-month due to lapse in his insurance coverage.   He remains at extremely high risk for more acute and chronic complications of diabetes which include CAD, CVA, CKD, retinopathy, and neuropathy. These are all discussed in detail with the patient.   Glucose logs and insulin administration records pertaining to this visit,  to be scanned into patient's records.  Recent labs reviewed.  - I have re-counseled the patient on diet management and weight loss  by adopting a carbohydrate restricted / protein rich  Diet.  -He still admits to dietary indiscretion including consumption of and sweetened beverages.  -  Suggestion is made for him to avoid simple carbohydrates  from his diet including Cakes, Sweet Desserts / Pastries, Ice Cream, Soda (diet and regular), Sweet Tea, Candies, Chips, Cookies, Store Bought Juices, Alcohol in Excess of  1-2 drinks a day, Artificial Sweeteners, and "Sugar-free" Products. This will help patient to have stable blood glucose profile and potentially avoid  unintended weight gain.  - Patient is advised to stick to a routine mealtimes to eat 3 meals  a day and avoid unnecessary snacks ( to snack only to correct hypoglycemia).  -He previously saw better results with Humulin RU500. -He is advised to increase his  Humulin U500 to 100  units 3 times daily before meals for pre-meal blood glucose readings of 90 or above milligrams per deciliter associated with strict monitoring of blood glucose 4 times a day-before meals and at bedtime.   -Patient is encouraged to call clinic for blood glucose levels less than 70 or above 300 mg /dl. -He is advised to continue metformin 1 g p.o. twice daily with breakfast and supper.  I gave him samples of Bydureon from clinic for a month while he is waiting for decision from his insurance.    - He is a perfect candidate for bariatric surgery, he has been hesitant to consider this option. -He is following with Norm Salt, CDE for DM education.  - Patient specific target  for A1c; LDL, HDL, Triglycerides, and  Waist Circumference were discussed in detail.  2) BP/HTN: His blood pressure is not controlled to target.  He is advised to proceed with his benazepril to 10 mg p.o. daily, also has verapamil 240 mg p.o. daily for blood pressure treatment.   3) Lipids/HPL: Uncontrolled with his recent LDL level of 99.  He is advised to continue pravastatin 40 mg p.o. nightly.    4)  Weight/Diet:  He has had no significant success in weight control.  He still admits to dietary indiscretion.   CDE consult in progress, exercise, and carbohydrates information provided.  He hesitates to  consider bariatric surgery.  5) Chronic Care/Health Maintenance:  -Patient  is  on ACEI/ARB and Statin medications and encouraged to continue to follow up with Ophthalmology, Podiatrist at least yearly or according to recommendations, and advised to  stay away from smoking. I have recommended yearly flu vaccine and pneumonia vaccination at least  every 5 years; and  sleep for at least 7 hours a day. -This patient cannot exercise optimally due to his heavy weight and comorbidities.  - I advised patient to maintain close follow up with Kari Baars, MD for primary care needs.   - Time spent with the patient: 25 min, of which >50% was spent in reviewing his blood glucose logs , discussing his hypo- and hyper-glycemic episodes, reviewing his current and  previous labs and insulin doses and developing a plan to avoid hypo- and hyper-glycemia. Please refer to Patient Instructions for Blood Glucose Monitoring and Insulin/Medications Dosing Guide"  in media tab for additional information. [name] participated in the discussions, expressed understanding, and voiced agreement with the above plans.  All questions were answered to his satisfaction. he is encouraged to contact clinic should he have any questions or concerns prior to his return visit.  Follow up plan: -Return in about 3 months (around 02/10/2018) for Meter, and Logs.  Marquis Lunch, MD Phone: [phone]  Fax: [phone]  -  This note was partially dictated with voice recognition software. Similar sounding words can be transcribed inadequately or may not  be corrected upon review.  11/10/2017, 12:58 PM

## 2017-11-10 NOTE — Patient Instructions (Signed)

## 2017-11-27 ENCOUNTER — Telehealth: Payer: Self-pay | Admitting: "Endocrinology

## 2017-11-27 NOTE — Telephone Encounter (Signed)
Aaron Potter is asking for a refill on Exenatide ER (BYDUREON BCISE) 2 MG/0.85ML AUIJ please advise?

## 2017-11-28 NOTE — Telephone Encounter (Signed)
PA done and approved.

## 2017-11-29 ENCOUNTER — Other Ambulatory Visit: Payer: Self-pay | Admitting: "Endocrinology

## 2018-01-04 ENCOUNTER — Other Ambulatory Visit: Payer: Self-pay | Admitting: "Endocrinology

## 2018-01-23 ENCOUNTER — Telehealth: Payer: Self-pay

## 2018-01-23 ENCOUNTER — Other Ambulatory Visit: Payer: Self-pay | Admitting: "Endocrinology

## 2018-01-23 MED ORDER — INSULIN REGULAR HUMAN (CONC) 500 UNIT/ML ~~LOC~~ SOPN: 100.0000 [IU] | PEN_INJECTOR | Freq: Three times a day (TID) | SUBCUTANEOUS | 3 refills | Status: DC

## 2018-01-23 NOTE — Telephone Encounter (Signed)
SIGN

## 2018-01-29 ENCOUNTER — Other Ambulatory Visit: Payer: Self-pay | Admitting: "Endocrinology

## 2018-02-07 LAB — COMPLETE METABOLIC PANEL WITH GFR
AG Ratio: 1.7 (calc) (ref 1.0–2.5)
ALBUMIN MSPROF: 4 g/dL (ref 3.6–5.1)
ALKALINE PHOSPHATASE (APISO): 69 U/L (ref 35–144)
ALT: 28 U/L (ref 9–46)
AST: 24 U/L (ref 10–35)
BUN: 14 mg/dL (ref 7–25)
CO2: 27 mmol/L (ref 20–32)
CREATININE: 0.75 mg/dL (ref 0.70–1.33)
Calcium: 9.3 mg/dL (ref 8.6–10.3)
Chloride: 102 mmol/L (ref 98–110)
GFR, Est African American: 120 mL/min/{1.73_m2} (ref >=60)
GFR, Est Non African American: 103 mL/min/{1.73_m2} (ref >=60)
GLUCOSE: 180 mg/dL — AB (ref 65–139)
Globulin: 2.3 g/dL (calc) (ref 1.9–3.7)
Potassium: 4.3 mmol/L (ref 3.5–5.3)
Sodium: 138 mmol/L (ref 135–146)
Total Bilirubin: 0.4 mg/dL (ref 0.2–1.2)
Total Protein: 6.3 g/dL (ref 6.1–8.1)

## 2018-02-07 LAB — VITAMIN D 25 HYDROXY (VIT D DEFICIENCY, FRACTURES): VIT D 25 HYDROXY: 25 ng/mL — AB (ref 30–100)

## 2018-02-07 LAB — HEMOGLOBIN A1C
HEMOGLOBIN A1C: 8.2 %{Hb} — AB (ref <5.7)
Mean Plasma Glucose: 189 (calc)
eAG (mmol/L): 10.4 (calc)

## 2018-02-07 LAB — TSH: TSH: 3.01 m[IU]/L (ref 0.40–4.50)

## 2018-02-07 LAB — T4, FREE: FREE T4: 1 ng/dL (ref 0.8–1.8)

## 2018-02-13 ENCOUNTER — Ambulatory Visit: Payer: Medicaid Other | Admitting: "Endocrinology

## 2018-02-16 ENCOUNTER — Encounter: Payer: Self-pay | Admitting: "Endocrinology

## 2018-02-16 ENCOUNTER — Ambulatory Visit (INDEPENDENT_AMBULATORY_CARE_PROVIDER_SITE_OTHER): Payer: Medicaid Other | Admitting: "Endocrinology

## 2018-02-16 VITALS — BP 130/73 | HR 76 | Ht 72.0 in | Wt >= 6400 oz

## 2018-02-16 DIAGNOSIS — E782 Mixed hyperlipidemia: Secondary | ICD-10-CM | POA: Diagnosis not present

## 2018-02-16 DIAGNOSIS — E1165 Type 2 diabetes mellitus with hyperglycemia: Secondary | ICD-10-CM

## 2018-02-16 DIAGNOSIS — I10 Essential (primary) hypertension: Secondary | ICD-10-CM | POA: Diagnosis not present

## 2018-02-16 DIAGNOSIS — E1151 Type 2 diabetes mellitus with diabetic peripheral angiopathy without gangrene: Secondary | ICD-10-CM

## 2018-02-16 DIAGNOSIS — Z794 Long term (current) use of insulin: Secondary | ICD-10-CM

## 2018-02-16 DIAGNOSIS — IMO0002 Reserved for concepts with insufficient information to code with codable children: Secondary | ICD-10-CM

## 2018-02-16 NOTE — Progress Notes (Signed)
Aaron Potter, CMA  

## 2018-02-16 NOTE — Patient Instructions (Signed)

## 2018-02-16 NOTE — Progress Notes (Signed)
Endocrinology follow-up note  Subjective:    Patient ID: Aaron Potter, male    DOB: August 03, 1960, PCP Kari Baars, MD   Past Medical History:  Diagnosis Date  . Asthma   . Balanitis   . Cellulitis   . COPD (chronic obstructive pulmonary disease) (HCC)   . Diabetes mellitus without complication (HCC)   . Gout   . Hypertension   . Lichen sclerosus   . Obesity, morbid (more than 100 lbs over ideal weight or BMI > 40) (HCC)   . Sleep apnea   . Urticaria    Past Surgical History:  Procedure Laterality Date  . CHOLECYSTECTOMY     Social History   Socioeconomic History  . Marital status: Single    Spouse name: Not on file  . Number of children: Not on file  . Years of education: Not on file  . Highest education level: Not on file  Occupational History  . Not on file  Social Needs  . Financial resource strain: Not on file  . Food insecurity:    Worry: Not on file    Inability: Not on file  . Transportation needs:    Medical: Not on file    Non-medical: Not on file  Tobacco Use  . Smoking status: Never Smoker  . Smokeless tobacco: Never Used  Substance and Sexual Activity  . Alcohol use: No    Alcohol/week: 0.0 standard drinks  . Drug use: No  . Sexual activity: Yes  Lifestyle  . Physical activity:    Days per week: Not on file    Minutes per session: Not on file  . Stress: Not on file  Relationships  . Social connections:    Talks on phone: Not on file    Gets together: Not on file    Attends religious service: Not on file    Active member of club or organization: Not on file    Attends meetings of clubs or organizations: Not on file    Relationship status: Not on file  Other Topics Concern  . Not on file  Social History Narrative  . Not on file   Outpatient Encounter Medications as of 02/16/2018  Medication Sig  . ACCU-CHEK AVIVA PLUS test strip USE TO CHECK BLOOD SUGAR 4 TIMES DAILY.  Marland Kitchen albuterol (PROAIR HFA) 108 (90 BASE) MCG/ACT inhaler Inhale 2  puffs into the lungs every 4 (four) hours as needed for wheezing or shortness of breath. Sometimes only uses one puff  . allopurinol (ZYLOPRIM) 300 MG tablet Take 300 mg by mouth daily.    Marland Kitchen ALPRAZolam (XANAX) 1 MG tablet Take 1 mg by mouth 4 (four) times daily.    . benazepril (LOTENSIN) 10 MG tablet Take 1 tablet (10 mg total) by mouth daily.  . benazepril (LOTENSIN) 5 MG tablet TAKE 1 TABLET ONCE DAILY.  . diphenhydrAMINE (BENADRYL) 25 mg capsule Take 50 mg by mouth every 6 (six) hours as needed for itching.  . Exenatide ER (BYDUREON BCISE) 2 MG/0.85ML AUIJ Inject 2 mg into the skin once a week.  Marland Kitchen GLOBAL EASE INJECT PEN NEEDLES 31G X 8 MM MISC USE 5 TIMES DAILY.  Marland Kitchen HUMULIN R U-500 KWIKPEN 500 UNIT/ML kwikpen INJECT 100 UNITS SUBQ 3 TIMES A DAY WITH MEALS--TO REPLACE LANTUS & NOVOLOG.  . HYDROcodone-acetaminophen (NORCO) 10-325 MG per tablet Take 1 tablet by mouth 4 (four) times daily.  . indomethacin (INDOCIN) 50 MG capsule Take 50 mg by mouth daily.   . insulin regular human  CONCENTRATED (HUMULIN R U-500 KWIKPEN) 500 UNIT/ML kwikpen Inject 100 Units into the skin 3 (three) times daily with meals.  . metFORMIN (GLUCOPHAGE) 500 MG tablet Take 1,000 mg by mouth 2 (two) times daily with a meal.   . Multiple Vitamin (MULTIVITAMIN WITH MINERALS) TABS tablet Take 1 tablet by mouth daily.  . pravastatin (PRAVACHOL) 40 MG tablet Take 40 mg by mouth daily.  Marland Kitchen tiotropium (SPIRIVA) 18 MCG inhalation capsule Place 18 mcg into inhaler and inhale daily.  . verapamil (CALAN-SR) 240 MG CR tablet Take 240 mg by mouth daily.  . Vitamin D, Ergocalciferol, (DRISDOL) 1.25 MG (50000 UT) CAPS capsule TAKE 1 CAPSULE BY MOUTH ONCE A WEEK.  Marland Kitchen zolpidem (AMBIEN) 10 MG tablet Take 10 mg by mouth at bedtime as needed for sleep.    No facility-administered encounter medications on file as of 02/16/2018.    ALLERGIES: Allergies  Allergen Reactions  . Biaxin [Clarithromycin] Anaphylaxis and Shortness Of Breath  .  Peanut-Containing Drug Products Anaphylaxis   VACCINATION STATUS:  There is no immunization history on file for this patient.  Diabetes  He presents for his follow-up diabetic visit. He has type 2 diabetes mellitus. Onset time: He was diagnosed at approximate age of 88 years. His disease course has been improving. There are no hypoglycemic associated symptoms. Pertinent negatives for hypoglycemia include no confusion, headaches, pallor or seizures. Pertinent negatives for diabetes include no chest pain, no fatigue, no polydipsia, no polyphagia, no polyuria and no weakness. There are no hypoglycemic complications. Symptoms are improving. Diabetic complications include PVD. Risk factors for coronary artery disease include dyslipidemia, diabetes mellitus, family history, male sex, obesity and sedentary lifestyle. Current diabetic treatment includes intensive insulin program. He is compliant with treatment most of the time. His weight is fluctuating minimally. He is following a generally unhealthy diet. He has had a previous visit with a dietitian. He never participates in exercise. His home blood glucose trend is increasing steadily. His breakfast blood glucose range is generally 180-200 mg/dl. His lunch blood glucose range is generally 180-200 mg/dl. His dinner blood glucose range is generally 180-200 mg/dl. His bedtime blood glucose range is generally 180-200 mg/dl. His overall blood glucose range is 180-200 mg/dl. An ACE inhibitor/angiotensin II receptor blocker is being taken.  Hyperlipidemia  This is a chronic problem. The current episode started more than 1 year ago. The problem is uncontrolled. Exacerbating diseases include diabetes and obesity. Pertinent negatives include no chest pain, myalgias or shortness of breath. Current antihyperlipidemic treatment includes statins. Risk factors for coronary artery disease include diabetes mellitus, dyslipidemia, hypertension, male sex, obesity and a sedentary  lifestyle.  Hypertension  This is a chronic problem. The current episode started more than 1 year ago. The problem is uncontrolled. Pertinent negatives include no chest pain, headaches, neck pain, palpitations or shortness of breath. Past treatments include ACE inhibitors and calcium channel blockers. The current treatment provides no improvement. Compliance problems include diet.  Hypertensive end-organ damage includes PVD.    Review of Systems  Constitutional: Negative for fatigue and unexpected weight change.  HENT: Negative for dental problem, mouth sores and trouble swallowing.   Eyes: Negative for visual disturbance.  Respiratory: Negative for cough, choking, chest tightness, shortness of breath and wheezing.   Cardiovascular: Negative for chest pain, palpitations and leg swelling.  Gastrointestinal: Negative for abdominal distention, abdominal pain, constipation, diarrhea, nausea and vomiting.  Endocrine: Negative for polydipsia, polyphagia and polyuria.  Genitourinary: Negative for dysuria, flank pain, hematuria and urgency.  Musculoskeletal: Negative for back pain, gait problem, myalgias and neck pain.  Skin: Negative for pallor, rash and wound.  Neurological: Negative for seizures, syncope, weakness, numbness and headaches.  Psychiatric/Behavioral: Negative.  Negative for confusion and dysphoric mood.    Objective:    BP 130/73   Pulse 76   Ht 6' (1.829 m)   Wt (!) 437 lb 12.8 oz (198.6 kg)   BMI 59.38 kg/m   Wt Readings from Last 3 Encounters:  02/16/18 (!) 437 lb 12.8 oz (198.6 kg)  11/10/17 (!) 437 lb (198.2 kg)  08/10/17 (!) 436 lb (197.8 kg)    Physical Exam  Constitutional: He is oriented to person, place, and time. He appears well-developed and well-nourished. He is cooperative. No distress.  HENT:  Head: Normocephalic and atraumatic.  Eyes: EOM are normal.  Neck: Normal range of motion. Neck supple. No tracheal deviation present. No thyromegaly present.   Cardiovascular: Normal rate, S1 normal and S2 normal. Exam reveals no gallop.  No murmur heard. Pulses:      Dorsalis pedis pulses are 0 on the right side and 0 on the left side.       Posterior tibial pulses are 0 on the right side and 0 on the left side.  Pulmonary/Chest: Effort normal. No respiratory distress. He has no wheezes.  Abdominal: He exhibits no distension. There is no abdominal tenderness. There is no guarding and no CVA tenderness.  Musculoskeletal:        General: No edema.     Right shoulder: He exhibits no swelling and no deformity.  Neurological: He is alert and oriented to person, place, and time. He has normal strength. No cranial nerve deficit or sensory deficit. Gait normal.  Skin: Skin is warm and dry. No rash noted. No cyanosis. Nails show no clubbing.  Psychiatric: He has a normal mood and affect. His speech is normal. Judgment normal. Cognition and memory are normal.    Results for orders placed or performed in visit on 11/10/17  Hemoglobin A1c  Result Value Ref Range   Hgb A1c MFr Bld 8.2 (H) <5.7 % of total Hgb   Mean Plasma Glucose 189 (calc)   eAG (mmol/L) 10.4 (calc)  COMPLETE METABOLIC PANEL WITH GFR  Result Value Ref Range   Glucose, Bld 180 (H) 65 - 139 mg/dL   BUN 14 7 - 25 mg/dL   Creat 1.610.75 0.960.70 - 0.451.33 mg/dL   GFR, Est Non African American 103 > OR = 60 mL/min/1.3473m2   GFR, Est African American 120 > OR = 60 mL/min/1.6073m2   BUN/Creatinine Ratio NOT APPLICABLE 6 - 22 (calc)   Sodium 138 135 - 146 mmol/L   Potassium 4.3 3.5 - 5.3 mmol/L   Chloride 102 98 - 110 mmol/L   CO2 27 20 - 32 mmol/L   Calcium 9.3 8.6 - 10.3 mg/dL   Total Protein 6.3 6.1 - 8.1 g/dL   Albumin 4.0 3.6 - 5.1 g/dL   Globulin 2.3 1.9 - 3.7 g/dL (calc)   AG Ratio 1.7 1.0 - 2.5 (calc)   Total Bilirubin 0.4 0.2 - 1.2 mg/dL   Alkaline phosphatase (APISO) 69 35 - 144 U/L   AST 24 10 - 35 U/L   ALT 28 9 - 46 U/L  VITAMIN D 25 Hydroxy (Vit-D Deficiency, Fractures)  Result  Value Ref Range   Vit D, 25-Hydroxy 25 (L) 30 - 100 ng/mL  TSH  Result Value Ref Range   TSH 3.01 0.40 - 4.50  mIU/L  T4, free  Result Value Ref Range   Free T4 1.0 0.8 - 1.8 ng/dL   Diabetic Labs (most recent): Lab Results  Component Value Date   HGBA1C 8.2 (H) 02/06/2018   HGBA1C 9.3 (H) 11/03/2017   HGBA1C 8.0 (H) 08/03/2017   Lipid Panel     Component Value Date/Time   CHOL 163 10/15/2016 0933   TRIG 92 10/15/2016 0933   HDL 45 10/15/2016 0933   CHOLHDL 3.6 10/15/2016 0933   VLDL 31 (H) 03/12/2015 1200   LDLCALC 99 10/15/2016 0933     Assessment & Plan:   1. Uncontrolled type 2 diabetes mellitus with diabetic peripheral angiopathy without gangrene, with long-term current use of insulin (HCC) - He has responded favorably to insulin U500, returns with improving glycemic profile and A1c of 8.2%, overall improving from 9.6% a year ago.    He remains at extremely high risk for more acute and chronic complications of diabetes which include CAD, CVA, CKD, retinopathy, and neuropathy. These are all discussed in detail with the patient.   Glucose logs and insulin administration records pertaining to this visit,  to be scanned into patient's records.  Recent labs reviewed.  - I have re-counseled the patient on diet management and weight loss  by adopting a carbohydrate restricted / protein rich  Diet.  -He still admits to dietary indiscretion including consumption of and sweetened beverages.  - Patient admits there is a room for improvement in his diet and drink choices. -  Suggestion is made for him to avoid simple carbohydrates  from his diet including Cakes, Sweet Desserts / Pastries, Ice Cream, Soda (diet and regular), Sweet Tea, Candies, Chips, Cookies, Store Bought Juices, Alcohol in Excess of  1-2 drinks a day, Artificial Sweeteners, and "Sugar-free" Products. This will help patient to have stable blood glucose profile and potentially avoid unintended weight gain.   -  Patient is advised to stick to a routine mealtimes to eat 3 meals  a day and avoid unnecessary snacks ( to snack only to correct hypoglycemia).  -He continues to respond to treatment with Humulin U500.    -He is advised to continue Humulin U500  100  units 3 times daily before meals for pre-meal blood glucose readings of 90 or above milligrams per deciliter associated with strict monitoring of blood glucose 4 times a day-before meals and at bedtime.   -Patient is encouraged to call clinic for blood glucose levels less than 70 or above 300 mg /dl. -He is advised to continue metformin 1 g p.o. twice daily with breakfast and supper.  -He is also advised to continue Bydureon 2 mg subcutaneously weekly.   - He is a perfect candidate for bariatric surgery, he has been hesitant to consider this option. -He is following with Norm Salt, CDE for DM education.  - Patient specific target  for A1c; LDL, HDL, Triglycerides, and  Waist Circumference were discussed in detail.  2) BP/HTN: His blood pressure is controlled to target.   He is advised to proceed with his benazepril to 10 mg p.o. daily, also has verapamil 240 mg p.o. daily for blood pressure treatment.   3) Lipids/HPL: Uncontrolled with his recent LDL level of 99.  He is advised to continue pravastatin 40 mg p.o. nightly.    4)  Weight/Diet:  He has had no significant success in weight control.  He still admits to dietary indiscretion.   CDE consult in progress, exercise, and carbohydrates information provided.  He  hesitates to consider bariatric surgery.  5) Chronic Care/Health Maintenance:  -Patient  is  on ACEI/ARB and Statin medications and encouraged to continue to follow up with Ophthalmology, Podiatrist at least yearly or according to recommendations, and advised to  stay away from smoking. I have recommended yearly flu vaccine and pneumonia vaccination at least every 5 years; and  sleep for at least 7 hours a day. -This patient cannot  exercise optimally due to his heavy weight and comorbidities.  - I advised patient to maintain close follow up with Kari Baars, MD for primary care needs.   - Time spent with the patient: 25 min, of which >50% was spent in reviewing his blood glucose logs , discussing his hypoglycemia and hyperglycemia episodes, reviewing his current and  previous labs / studies and medications  doses and developing a plan to avoid hypoglycemia and hyperglycemia. Please refer to Patient Instructions for Blood Glucose Monitoring and Insulin/Medications Dosing Guide"  in media tab for additional information. [name] participated in the discussions, expressed understanding, and voiced agreement with the above plans.  All questions were answered to his satisfaction. he is encouraged to contact clinic should he have any questions or concerns prior to his return visit.   Follow up plan: -Return in about 3 months (around 05/17/2018) for Meter, and Logs.  Marquis Lunch, MD Phone: [phone]  Fax: [phone]  -  This note was partially dictated with voice recognition software. Similar sounding words can be transcribed inadequately or may not  be corrected upon review.  02/16/2018, 11:08 AM

## 2018-02-20 ENCOUNTER — Other Ambulatory Visit: Payer: Self-pay | Admitting: "Endocrinology

## 2018-03-05 ENCOUNTER — Other Ambulatory Visit: Payer: Self-pay | Admitting: "Endocrinology

## 2018-03-19 ENCOUNTER — Other Ambulatory Visit: Payer: Self-pay | Admitting: "Endocrinology

## 2018-03-26 ENCOUNTER — Other Ambulatory Visit: Payer: Self-pay | Admitting: "Endocrinology

## 2018-04-03 ENCOUNTER — Other Ambulatory Visit: Payer: Self-pay | Admitting: "Endocrinology

## 2018-04-16 ENCOUNTER — Other Ambulatory Visit: Payer: Self-pay | Admitting: "Endocrinology

## 2018-04-16 ENCOUNTER — Telehealth: Payer: Self-pay

## 2018-04-16 DIAGNOSIS — E1151 Type 2 diabetes mellitus with diabetic peripheral angiopathy without gangrene: Secondary | ICD-10-CM

## 2018-04-16 DIAGNOSIS — E1165 Type 2 diabetes mellitus with hyperglycemia: Principal | ICD-10-CM

## 2018-04-16 DIAGNOSIS — IMO0002 Reserved for concepts with insufficient information to code with codable children: Secondary | ICD-10-CM

## 2018-04-16 DIAGNOSIS — Z794 Long term (current) use of insulin: Principal | ICD-10-CM

## 2018-04-16 MED ORDER — INSULIN REGULAR HUMAN (CONC) 500 UNIT/ML ~~LOC~~ SOPN: PEN_INJECTOR | SUBCUTANEOUS | 2 refills | Status: DC

## 2018-04-16 NOTE — Telephone Encounter (Signed)
Aaron Potter, CMA  

## 2018-04-24 ENCOUNTER — Other Ambulatory Visit: Payer: Self-pay | Admitting: "Endocrinology

## 2018-04-24 ENCOUNTER — Telehealth: Payer: Self-pay | Admitting: "Endocrinology

## 2018-04-24 NOTE — Telephone Encounter (Signed)
Pt called very upset this morning yelling in the phone that he has been out of medication for over a week. I advised him that we had sent in refills last week for his Humulin R U-500  As he had requested. We received a PA request on the fax 04-23-2018. The PA # is below and the pharmacy was notified.            PA # [id number]

## 2018-04-30 ENCOUNTER — Other Ambulatory Visit: Payer: Self-pay | Admitting: "Endocrinology

## 2018-05-03 ENCOUNTER — Other Ambulatory Visit: Payer: Self-pay | Admitting: "Endocrinology

## 2018-06-12 LAB — MICROALBUMIN / CREATININE URINE RATIO
CREATININE, URINE: 101 mg/dL (ref 20–320)
Microalb Creat Ratio: 8 mcg/mg creat (ref <30)
Microalb, Ur: 0.8 mg/dL

## 2018-06-12 LAB — COMPLETE METABOLIC PANEL WITH GFR
AG Ratio: 1.6 (calc) (ref 1.0–2.5)
ALT: 29 U/L (ref 9–46)
AST: 26 U/L (ref 10–35)
Albumin: 3.9 g/dL (ref 3.6–5.1)
Alkaline phosphatase (APISO): 73 U/L (ref 35–144)
BUN: 13 mg/dL (ref 7–25)
CO2: 29 mmol/L (ref 20–32)
CREATININE: 0.72 mg/dL (ref 0.70–1.33)
Calcium: 9.6 mg/dL (ref 8.6–10.3)
Chloride: 100 mmol/L (ref 98–110)
GFR, EST AFRICAN AMERICAN: 122 mL/min/{1.73_m2} (ref >=60)
GFR, Est Non African American: 105 mL/min/{1.73_m2} (ref >=60)
Globulin: 2.5 g/dL (calc) (ref 1.9–3.7)
Glucose, Bld: 230 mg/dL — ABNORMAL HIGH (ref 65–99)
Potassium: 4.6 mmol/L (ref 3.5–5.3)
Sodium: 138 mmol/L (ref 135–146)
Total Bilirubin: 0.4 mg/dL (ref 0.2–1.2)
Total Protein: 6.4 g/dL (ref 6.1–8.1)

## 2018-06-12 LAB — LIPID PANEL
CHOL/HDL RATIO: 3.6 (calc) (ref <5.0)
Cholesterol: 126 mg/dL (ref <200)
HDL: 35 mg/dL — ABNORMAL LOW (ref >=40)
LDL Cholesterol (Calc): 69 mg/dL (calc)
NON-HDL CHOLESTEROL (CALC): 91 mg/dL (ref <130)
Triglycerides: 137 mg/dL (ref <150)

## 2018-06-12 LAB — VITAMIN D 25 HYDROXY (VIT D DEFICIENCY, FRACTURES): Vit D, 25-Hydroxy: 25 ng/mL — ABNORMAL LOW (ref 30–100)

## 2018-06-12 LAB — HEMOGLOBIN A1C
Hgb A1c MFr Bld: 8.1 % of total Hgb — ABNORMAL HIGH (ref <5.7)
Mean Plasma Glucose: 186 (calc)
eAG (mmol/L): 10.3 (calc)

## 2018-06-18 ENCOUNTER — Ambulatory Visit (INDEPENDENT_AMBULATORY_CARE_PROVIDER_SITE_OTHER): Payer: Medicaid Other | Admitting: "Endocrinology

## 2018-06-18 ENCOUNTER — Encounter: Payer: Self-pay | Admitting: "Endocrinology

## 2018-06-18 ENCOUNTER — Other Ambulatory Visit: Payer: Self-pay

## 2018-06-18 VITALS — BP 147/80 | HR 71 | Ht 69.0 in | Wt >= 6400 oz

## 2018-06-18 DIAGNOSIS — I10 Essential (primary) hypertension: Secondary | ICD-10-CM | POA: Diagnosis not present

## 2018-06-18 DIAGNOSIS — E1151 Type 2 diabetes mellitus with diabetic peripheral angiopathy without gangrene: Secondary | ICD-10-CM | POA: Diagnosis not present

## 2018-06-18 DIAGNOSIS — E1165 Type 2 diabetes mellitus with hyperglycemia: Secondary | ICD-10-CM

## 2018-06-18 DIAGNOSIS — E782 Mixed hyperlipidemia: Secondary | ICD-10-CM | POA: Diagnosis not present

## 2018-06-18 DIAGNOSIS — IMO0002 Reserved for concepts with insufficient information to code with codable children: Secondary | ICD-10-CM

## 2018-06-18 DIAGNOSIS — Z794 Long term (current) use of insulin: Secondary | ICD-10-CM

## 2018-06-18 DIAGNOSIS — E559 Vitamin D deficiency, unspecified: Secondary | ICD-10-CM

## 2018-06-18 MED ORDER — HUMULIN R U-500 KWIKPEN 500 UNIT/ML ~~LOC~~ SOPN: PEN_INJECTOR | SUBCUTANEOUS | 3 refills | Status: DC

## 2018-06-18 NOTE — Progress Notes (Signed)
Endocrinology follow-up note  Subjective:    Patient ID: Aaron Potter, male    DOB: 08/24/1960, PCP Kari BaarsHawkins, Edward, MD   Past Medical History:  Diagnosis Date  . Asthma   . Balanitis   . Cellulitis   . COPD (chronic obstructive pulmonary disease) (HCC)   . Diabetes mellitus without complication (HCC)   . Gout   . Hypertension   . Lichen sclerosus   . Obesity, morbid (more than 100 lbs over ideal weight or BMI > 40) (HCC)   . Sleep apnea   . Urticaria    Past Surgical History:  Procedure Laterality Date  . CHOLECYSTECTOMY     Social History   Socioeconomic History  . Marital status: Single    Spouse name: Not on file  . Number of children: Not on file  . Years of education: Not on file  . Highest education level: Not on file  Occupational History  . Not on file  Social Needs  . Financial resource strain: Not on file  . Food insecurity    Worry: Not on file    Inability: Not on file  . Transportation needs    Medical: Not on file    Non-medical: Not on file  Tobacco Use  . Smoking status: Never Smoker  . Smokeless tobacco: Never Used  Substance and Sexual Activity  . Alcohol use: No    Alcohol/week: 0.0 standard drinks  . Drug use: No  . Sexual activity: Yes  Lifestyle  . Physical activity    Days per week: Not on file    Minutes per session: Not on file  . Stress: Not on file  Relationships  . Social Musicianconnections    Talks on phone: Not on file    Gets together: Not on file    Attends religious service: Not on file    Active member of club or organization: Not on file    Attends meetings of clubs or organizations: Not on file    Relationship status: Not on file  Other Topics Concern  . Not on file  Social History Narrative  . Not on file   Outpatient Encounter Medications as of 06/18/2018  Medication Sig  . ACCU-CHEK AVIVA PLUS test strip USE TO CHECK BLOOD SUGAR 4 TIMES DAILY.  Marland Kitchen. albuterol (PROAIR HFA) 108 (90 BASE) MCG/ACT inhaler Inhale 2  puffs into the lungs every 4 (four) hours as needed for wheezing or shortness of breath. Sometimes only uses one puff  . allopurinol (ZYLOPRIM) 300 MG tablet Take 300 mg by mouth daily.    Marland Kitchen. ALPRAZolam (XANAX) 1 MG tablet Take 1 mg by mouth 4 (four) times daily.    . benazepril (LOTENSIN) 10 MG tablet Take 1 tablet (10 mg total) by mouth daily.  Marland Kitchen. BYDUREON BCISE 2 MG/0.85ML AUIJ INJECT 1 INJECTION INTO THE SKIN ONCE A WEEK.  . diphenhydrAMINE (BENADRYL) 25 mg capsule Take 50 mg by mouth every 6 (six) hours as needed for itching.  Marland Kitchen. GLOBAL EASE INJECT PEN NEEDLES 31G X 8 MM MISC USE 5 TIMES A DAY.  Marland Kitchen. HYDROcodone-acetaminophen (NORCO) 10-325 MG per tablet Take 1 tablet by mouth 4 (four) times daily.  . indomethacin (INDOCIN) 50 MG capsule Take 50 mg by mouth daily.   . insulin regular human CONCENTRATED (HUMULIN R U-500 KWIKPEN) 500 UNIT/ML kwikpen INJECT 100 UNITS SUBQ 3 TIMES A DAY WITH MEALS--TO REPLACE LANTUS & NOVOLOG.  . insulin regular human CONCENTRATED (HUMULIN R U-500 KWIKPEN) 500 UNIT/ML [employer] INJECT  110 UNITS WITH BREAKFAST AND LUNCH, AND 100 UNITS WITH SUPPER  INTO THE SKIN, NOT MORE THAN 3 TIMES A DAY  . metFORMIN (GLUCOPHAGE) 500 MG tablet Take 1,000 mg by mouth 2 (two) times daily with a meal.   . Multiple Vitamin (MULTIVITAMIN WITH MINERALS) TABS tablet Take 1 tablet by mouth daily.  . pravastatin (PRAVACHOL) 40 MG tablet Take 40 mg by mouth daily.  Marland Kitchen. tiotropium (SPIRIVA) 18 MCG inhalation capsule Place 18 mcg into inhaler and inhale daily.  . verapamil (CALAN-SR) 240 MG CR tablet Take 240 mg by mouth daily.  . Vitamin D, Ergocalciferol, (DRISDOL) 1.25 MG (50000 UT) CAPS capsule TAKE 1 CAPSULE BY MOUTH ONCE A WEEK.  Marland Kitchen. zolpidem (AMBIEN) 10 MG tablet Take 10 mg by mouth at bedtime as needed for sleep.   . [DISCONTINUED] benazepril (LOTENSIN) 5 MG tablet TAKE 1 TABLET ONCE DAILY. (Patient not taking: Reported on 06/18/2018)  . [DISCONTINUED] HUMULIN R U-500 KWIKPEN 500 UNIT/ML kwikpen  INJECT 100 UNITS INTO THE SKIN 3 TIMES DAILY WITH MEALS.   No facility-administered encounter medications on file as of 06/18/2018.    ALLERGIES: Allergies  Allergen Reactions  . Biaxin [Clarithromycin] Anaphylaxis and Shortness Of Breath  . Peanut-Containing Drug Products Anaphylaxis   VACCINATION STATUS:  There is no immunization history on file for this patient.  Diabetes He presents for his follow-up diabetic visit. He has type 2 diabetes mellitus. Onset time: He was diagnosed at approximate age of 58 years. His disease course has been stable. There are no hypoglycemic associated symptoms. Pertinent negatives for hypoglycemia include no confusion, headaches, pallor or seizures. Pertinent negatives for diabetes include no chest pain, no fatigue, no polydipsia, no polyphagia, no polyuria and no weakness. There are no hypoglycemic complications. Symptoms are stable. Diabetic complications include PVD. Risk factors for coronary artery disease include dyslipidemia, diabetes mellitus, family history, male sex, obesity and sedentary lifestyle. Current diabetic treatment includes intensive insulin program. He is compliant with treatment most of the time. His weight is increasing steadily. He is following a generally unhealthy diet. He has had a previous visit with a dietitian. He never participates in exercise. His home blood glucose trend is increasing steadily. His breakfast blood glucose range is generally 180-200 mg/dl. His lunch blood glucose range is generally 180-200 mg/dl. His dinner blood glucose range is generally 180-200 mg/dl. His bedtime blood glucose range is generally 180-200 mg/dl. His overall blood glucose range is 180-200 mg/dl. An ACE inhibitor/angiotensin II receptor blocker is being taken.  Hyperlipidemia This is a chronic problem. The current episode started more than 1 year ago. The problem is uncontrolled. Exacerbating diseases include diabetes and obesity. Associated symptoms  include shortness of breath. Pertinent negatives include no chest pain or myalgias. Current antihyperlipidemic treatment includes statins. Risk factors for coronary artery disease include diabetes mellitus, dyslipidemia, hypertension, male sex, obesity and a sedentary lifestyle.  Hypertension This is a chronic problem. The current episode started more than 1 year ago. The problem is uncontrolled. Associated symptoms include shortness of breath. Pertinent negatives include no chest pain, headaches, neck pain or palpitations. Past treatments include ACE inhibitors and calcium channel blockers. The current treatment provides no improvement. Compliance problems include diet.  Hypertensive end-organ damage includes PVD.    Review of Systems  Constitutional: Negative for fatigue and unexpected weight change.  HENT: Negative for dental problem, mouth sores and trouble swallowing.   Eyes: Negative for visual disturbance.  Respiratory: Positive for shortness of breath. Negative for cough, choking,  chest tightness and wheezing.   Cardiovascular: Negative for chest pain, palpitations and leg swelling.  Gastrointestinal: Negative for abdominal distention, abdominal pain, constipation, diarrhea, nausea and vomiting.  Endocrine: Negative for polydipsia, polyphagia and polyuria.  Genitourinary: Negative for dysuria, flank pain, hematuria and urgency.  Musculoskeletal: Negative for back pain, gait problem, myalgias and neck pain.  Skin: Negative for pallor, rash and wound.  Neurological: Negative for seizures, syncope, weakness, numbness and headaches.  Psychiatric/Behavioral: Negative for confusion and dysphoric mood.    Objective:    BP (!) 147/80   Pulse 71   Ht  (1.753 m)   Wt (!) 441 lb 3.2 oz (200.1 kg)   SpO2 97%   BMI 65.15 kg/m   Wt Readings from Last 3 Encounters:  06/18/18 (!) 441 lb 3.2 oz (200.1 kg)  02/16/18 (!) 437 lb 12.8 oz (198.6 kg)  11/10/17 (!) 437 lb (198.2 kg)       Results for orders placed or performed in visit on 02/16/18  Hemoglobin A1c  Result Value Ref Range   Hgb A1c MFr Bld 8.1 (H) <5.7 % of total Hgb   Mean Plasma Glucose 186 (calc)   eAG (mmol/L) 10.3 (calc)  COMPLETE METABOLIC PANEL WITH GFR  Result Value Ref Range   Glucose, Bld 230 (H) 65 - 99 mg/dL   BUN 13 7 - 25 mg/dL   Creat 1.61 0.96 - 0.45 mg/dL   GFR, Est Non African American 105 > OR = 60 mL/min/1.42m2   GFR, Est African American 122 > OR = 60 mL/min/1.35m2   BUN/Creatinine Ratio NOT APPLICABLE 6 - 22 (calc)   Sodium 138 135 - 146 mmol/L   Potassium 4.6 3.5 - 5.3 mmol/L   Chloride 100 98 - 110 mmol/L   CO2 29 20 - 32 mmol/L   Calcium 9.6 8.6 - 10.3 mg/dL   Total Protein 6.4 6.1 - 8.1 g/dL   Albumin 3.9 3.6 - 5.1 g/dL   Globulin 2.5 1.9 - 3.7 g/dL (calc)   AG Ratio 1.6 1.0 - 2.5 (calc)   Total Bilirubin 0.4 0.2 - 1.2 mg/dL   Alkaline phosphatase (APISO) 73 35 - 144 U/L   AST 26 10 - 35 U/L   ALT 29 9 - 46 U/L  Microalbumin / creatinine urine ratio  Result Value Ref Range   Creatinine, Urine 101 20 - 320 mg/dL   Microalb, Ur 0.8 mg/dL   Microalb Creat Ratio 8 <30 mcg/mg creat  Lipid panel  Result Value Ref Range   Cholesterol 126 <200 mg/dL   HDL 35 (L) > OR = 40 mg/dL   Triglycerides 409 <811 mg/dL   LDL Cholesterol (Calc) 69 mg/dL (calc)   Total CHOL/HDL Ratio 3.6 <5.0 (calc)   Non-HDL Cholesterol (Calc) 91 <914 mg/dL (calc)  VITAMIN D 25 Hydroxy (Vit-D Deficiency, Fractures)  Result Value Ref Range   Vit D, 25-Hydroxy 25 (L) 30 - 100 ng/mL   Diabetic Labs (most recent): Lab Results  Component Value Date   HGBA1C 8.1 (H) 06/11/2018   HGBA1C 8.2 (H) 02/06/2018   HGBA1C 9.3 (H) 11/03/2017   Lipid Panel     Component Value Date/Time   CHOL 126 06/11/2018 0718   TRIG 137 06/11/2018 0718   HDL 35 (L) 06/11/2018 0718   CHOLHDL 3.6 06/11/2018 0718   VLDL 31 (H) 03/12/2015 1200   LDLCALC 69 06/11/2018 0718     Assessment & Plan:   1.  Uncontrolled type 2 diabetes mellitus with diabetic  peripheral angiopathy without gangrene, with long-term current use of insulin ([place]) - He has responded favorably to insulin U500, returns with improving glycemic profile and A1c of 8.1%, progressively improving from 9.6%.     He remains at extremely high risk for more acute and chronic complications of diabetes which include CAD, CVA, CKD, retinopathy, and neuropathy. These are all discussed in detail with the patient.   Glucose logs and insulin administration records pertaining to this visit,  to be scanned into patient's records.  Recent labs reviewed.  - I have re-counseled the patient on diet management and weight loss  by adopting a carbohydrate restricted / protein rich  Diet.  -He still admits to dietary indiscretion including consumption of and sweetened beverages.  - he  admits there is a room for improvement in his diet and drink choices. -  Suggestion is made for him to avoid simple carbohydrates  from his diet including Cakes, Sweet Desserts / Pastries, Ice Cream, Soda (diet and regular), Sweet Tea, Candies, Chips, Cookies, Sweet Pastries,  Store Bought Juices, Alcohol in Excess of  1-2 drinks a day, Artificial Sweeteners, Coffee Creamer, and "Sugar-free" Products. This will help patient to have stable blood glucose profile and potentially avoid unintended weight gain.   - Patient is advised to stick to a routine mealtimes to eat 3 meals  a day and avoid unnecessary snacks ( to snack only to correct hypoglycemia).  -He continues to respond to treatment with Humulin U500.    -He is advised to increase Humulin U500  to 110 units with breakfast and lunch, and 100 units at supper for pre-meal blood glucose readings of 90 or above milligrams per deciliter associated with strict monitoring of blood glucose 4 times a day-before meals and at bedtime.   -Patient is encouraged to call clinic for blood glucose levels less than 70 or above 300  mg /dl. -He is advised to continue metformin 1 g p.o. twice daily with breakfast and supper.  -He is also advised to continue Bydureon 2 mg subcutaneously weekly.   - He is a perfect candidate for bariatric surgery, he has been hesitant to consider this option. -He is following with Jearld Fenton, CDE for DM education.  - Patient specific target  for A1c; LDL, HDL, Triglycerides, and  Waist Circumference were discussed in detail.  2) BP/HTN: His blood pressure is not controlled to target.   He is advised to proceed with his benazepril to 10 mg p.o. daily, also has verapamil 240 mg p.o. daily for blood pressure treatment.   3) Lipids/HPL: Uncontrolled with his recent LDL level of 99.  He is advised to continue pravastatin 40 mg p.o. nightly.  Side effects and precautions discussed with him.    4)  Weight/Diet:  He has had no significant success in weight control.  He still admits to dietary indiscretion.   CDE consult in progress, exercise, and carbohydrates information provided.  He hesitates to consider bariatric surgery.  5) Chronic Care/Health Maintenance:  -Patient  is  on ACEI/ARB and Statin medications and encouraged to continue to follow up with Ophthalmology, Podiatrist at least yearly or according to recommendations, and advised to  stay away from smoking. I have recommended yearly flu vaccine and pneumonia vaccination at least every 5 years; and  sleep for at least 7 hours a day. -This patient cannot exercise optimally due to his heavy weight and comorbidities.  - I advised patient to maintain close follow up with Sinda Du, MD for  primary care needs.   - Patient Care Time Today:  25 min, of which >50% was spent in reviewing his  current and  previous labs/studies, his blood glucose readings, previous treatments, and medications doses and developing a plan for long-term care based on the latest recommendations for standards of care.  Aaron BihariGeorge Connaughton participated in the  discussions, expressed understanding, and voiced agreement with the above plans.  All questions were answered to his satisfaction. he is encouraged to contact clinic should he have any questions or concerns prior to his return visit.   Follow up plan: -Return in about 4 months (around 10/18/2018) for Follow up with Pre-visit Labs, Meter, and Logs.  Marquis LunchGebre Nida, MD Phone: [phone]  Fax: [phone]  -  This note was partially dictated with voice recognition software. Similar sounding words can be transcribed inadequately or may not  be corrected upon review.  06/18/2018, 10:23 AM

## 2018-06-18 NOTE — Patient Instructions (Signed)

## 2018-07-03 ENCOUNTER — Other Ambulatory Visit: Payer: Self-pay | Admitting: "Endocrinology

## 2018-07-05 ENCOUNTER — Other Ambulatory Visit: Payer: Self-pay | Admitting: "Endocrinology

## 2018-07-05 DIAGNOSIS — IMO0002 Reserved for concepts with insufficient information to code with codable children: Secondary | ICD-10-CM

## 2018-07-05 DIAGNOSIS — E1151 Type 2 diabetes mellitus with diabetic peripheral angiopathy without gangrene: Secondary | ICD-10-CM

## 2018-07-31 ENCOUNTER — Other Ambulatory Visit: Payer: Self-pay | Admitting: "Endocrinology

## 2018-07-31 DIAGNOSIS — E1151 Type 2 diabetes mellitus with diabetic peripheral angiopathy without gangrene: Secondary | ICD-10-CM

## 2018-07-31 DIAGNOSIS — IMO0002 Reserved for concepts with insufficient information to code with codable children: Secondary | ICD-10-CM

## 2018-08-04 ENCOUNTER — Other Ambulatory Visit: Payer: Self-pay | Admitting: "Endocrinology

## 2018-08-28 ENCOUNTER — Telehealth: Payer: Self-pay | Admitting: "Endocrinology

## 2018-08-28 DIAGNOSIS — E1151 Type 2 diabetes mellitus with diabetic peripheral angiopathy without gangrene: Secondary | ICD-10-CM

## 2018-08-28 DIAGNOSIS — IMO0002 Reserved for concepts with insufficient information to code with codable children: Secondary | ICD-10-CM

## 2018-08-28 MED ORDER — HUMULIN R U-500 KWIKPEN 500 UNIT/ML ~~LOC~~ SOPN: PEN_INJECTOR | SUBCUTANEOUS | 2 refills | Status: DC

## 2018-08-28 NOTE — Telephone Encounter (Signed)
Patient called and said he thought that Dr Dorris Fetch changed his RX for HUMULIN R U-500 KWIKPEN 500 UNIT/ML kwikpen with him taking it 110 units 2x a day and 100 units at night. He said the pharmacy does not have this updated with there system. He uses Therapist, sports.

## 2018-08-28 NOTE — Telephone Encounter (Signed)
rx sent

## 2018-08-29 ENCOUNTER — Other Ambulatory Visit: Payer: Self-pay | Admitting: "Endocrinology

## 2018-09-28 ENCOUNTER — Other Ambulatory Visit: Payer: Self-pay | Admitting: "Endocrinology

## 2018-10-11 LAB — HEMOGLOBIN A1C
Hgb A1c MFr Bld: 8.5 % of total Hgb — ABNORMAL HIGH (ref <5.7)
Mean Plasma Glucose: 197 (calc)
eAG (mmol/L): 10.9 (calc)

## 2018-10-11 LAB — COMPLETE METABOLIC PANEL WITH GFR
AG Ratio: 1.6 (calc) (ref 1.0–2.5)
ALT: 26 U/L (ref 9–46)
AST: 23 U/L (ref 10–35)
Albumin: 3.8 g/dL (ref 3.6–5.1)
Alkaline phosphatase (APISO): 70 U/L (ref 35–144)
BUN: 14 mg/dL (ref 7–25)
CO2: 29 mmol/L (ref 20–32)
Calcium: 9.5 mg/dL (ref 8.6–10.3)
Chloride: 103 mmol/L (ref 98–110)
Creat: 0.75 mg/dL (ref 0.70–1.33)
GFR, Est African American: 120 mL/min/{1.73_m2} (ref >=60)
GFR, Est Non African American: 103 mL/min/{1.73_m2} (ref >=60)
Globulin: 2.4 g/dL (calc) (ref 1.9–3.7)
Glucose, Bld: 185 mg/dL — ABNORMAL HIGH (ref 65–99)
Potassium: 4.5 mmol/L (ref 3.5–5.3)
Sodium: 138 mmol/L (ref 135–146)
Total Bilirubin: 0.4 mg/dL (ref 0.2–1.2)
Total Protein: 6.2 g/dL (ref 6.1–8.1)

## 2018-10-18 ENCOUNTER — Other Ambulatory Visit: Payer: Self-pay

## 2018-10-18 ENCOUNTER — Encounter: Payer: Self-pay | Admitting: "Endocrinology

## 2018-10-18 ENCOUNTER — Ambulatory Visit (INDEPENDENT_AMBULATORY_CARE_PROVIDER_SITE_OTHER): Payer: Medicaid Other | Admitting: "Endocrinology

## 2018-10-18 DIAGNOSIS — IMO0002 Reserved for concepts with insufficient information to code with codable children: Secondary | ICD-10-CM

## 2018-10-18 DIAGNOSIS — Z794 Long term (current) use of insulin: Secondary | ICD-10-CM

## 2018-10-18 DIAGNOSIS — I10 Essential (primary) hypertension: Secondary | ICD-10-CM

## 2018-10-18 DIAGNOSIS — E1151 Type 2 diabetes mellitus with diabetic peripheral angiopathy without gangrene: Secondary | ICD-10-CM

## 2018-10-18 DIAGNOSIS — E782 Mixed hyperlipidemia: Secondary | ICD-10-CM

## 2018-10-18 DIAGNOSIS — E1165 Type 2 diabetes mellitus with hyperglycemia: Secondary | ICD-10-CM

## 2018-10-18 MED ORDER — HUMULIN R U-500 KWIKPEN 500 UNIT/ML ~~LOC~~ SOPN: PEN_INJECTOR | SUBCUTANEOUS | 2 refills | Status: DC

## 2018-10-18 MED ORDER — GLIPIZIDE ER 5 MG PO TB24: 5.0000 mg | ORAL_TABLET | Freq: Every day | ORAL | 3 refills | Status: DC

## 2018-10-18 MED ORDER — GLOBAL EASE INJECT PEN NEEDLES 31G X 8 MM MISC: 3 refills | Status: DC

## 2018-10-18 NOTE — Progress Notes (Signed)
10/18/2018                                                    Endocrinology Telehealth Visit Follow up Note -During COVID -19 Pandemic  This visit type was conducted due to national recommendations for restrictions regarding the COVID-19 Pandemic  in an effort to limit this patient's exposure and mitigate transmission of the corona virus.  Due to his co-morbid illnesses, Aaron Potter is at  moderate to high risk for complications without adequate follow up.  This format is felt to be most appropriate for him at this time.  I connected with this patient on 10/18/2018   by telephone and verified that I am speaking with the correct person using two identifiers. Aaron Potter, September 30, 1960. he has verbally consented to this visit. All issues noted in this document were discussed and addressed. The format was not optimal for physical exam.   Subjective:    Patient ID: Aaron Potter, male    DOB: 11/07/1960, PCP Kari Baars, MD   Past Medical History:  Diagnosis Date  . Asthma   . Balanitis   . Cellulitis   . COPD (chronic obstructive pulmonary disease) (HCC)   . Diabetes mellitus without complication (HCC)   . Gout   . Hypertension   . Lichen sclerosus   . Obesity, morbid (more than 100 lbs over ideal weight or BMI > 40) (HCC)   . Sleep apnea   . Urticaria    Past Surgical History:  Procedure Laterality Date  . CHOLECYSTECTOMY     Social History   Socioeconomic History  . Marital status: Single    Spouse name: Not on file  . Number of children: Not on file  . Years of education: Not on file  . Highest education level: Not on file  Occupational History  . Not on file  Social Needs  . Financial resource strain: Not on file  . Food insecurity    Worry: Not on file    Inability: Not on file  . Transportation needs    Medical: Not on file    Non-medical: Not on file  Tobacco Use  . Smoking status: Never Smoker  . Smokeless tobacco: Never Used  Substance and Sexual  Activity  . Alcohol use: No    Alcohol/week: 0.0 standard drinks  . Drug use: No  . Sexual activity: Yes  Lifestyle  . Physical activity    Days per week: Not on file    Minutes per session: Not on file  . Stress: Not on file  Relationships  . Social Musician on phone: Not on file    Gets together: Not on file    Attends religious service: Not on file    Active member of club or organization: Not on file    Attends meetings of clubs or organizations: Not on file    Relationship status: Not on file  Other Topics Concern  . Not on file  Social History Narrative  . Not on file   Outpatient Encounter Medications as of 10/18/2018  Medication Sig  . ACCU-CHEK AVIVA PLUS test strip USE TO CHECK BLOOD SUGAR 4 TIMES DAILY.  Marland Kitchen albuterol (PROAIR HFA) 108 (90 BASE) MCG/ACT inhaler Inhale 2 puffs into the lungs every 4 (four) hours as needed for wheezing or shortness of  breath. Sometimes only uses one puff  . allopurinol (ZYLOPRIM) 300 MG tablet Take 300 mg by mouth daily.    Marland Kitchen ALPRAZolam (XANAX) 1 MG tablet Take 1 mg by mouth 4 (four) times daily.    . benazepril (LOTENSIN) 10 MG tablet Take 1 tablet (10 mg total) by mouth daily.  . benazepril (LOTENSIN) 5 MG tablet TAKE 1 TABLET BY MOUTH ONCE DAILY.  Marland Kitchen BYDUREON BCISE 2 MG/0.85ML AUIJ INJECT 1 INJECTION INTO THE SKIN ONCE A WEEK.  . diphenhydrAMINE (BENADRYL) 25 mg capsule Take 50 mg by mouth every 6 (six) hours as needed for itching.  Marland Kitchen glipiZIDE (GLUCOTROL XL) 5 MG 24 hr tablet Take 1 tablet (5 mg total) by mouth daily with breakfast.  . HYDROcodone-acetaminophen (NORCO) 10-325 MG per tablet Take 1 tablet by mouth 4 (four) times daily.  . indomethacin (INDOCIN) 50 MG capsule Take 50 mg by mouth daily.   . Insulin Pen Needle (GLOBAL EASE INJECT PEN NEEDLES) 31G X 8 MM MISC USE 3 TIMES DAILY.  Marland Kitchen insulin regular human CONCENTRATED (HUMULIN R U-500 KWIKPEN) 500 UNIT/ML kwikpen INJECT 120 UNITS SUBQ with breakfast ,110 units with   Lunch, and 100UNITS with supper  . metFORMIN (GLUCOPHAGE) 500 MG tablet Take 1,000 mg by mouth 2 (two) times daily with a meal.   . Multiple Vitamin (MULTIVITAMIN WITH MINERALS) TABS tablet Take 1 tablet by mouth daily.  . pravastatin (PRAVACHOL) 40 MG tablet Take 40 mg by mouth daily.  Marland Kitchen tiotropium (SPIRIVA) 18 MCG inhalation capsule Place 18 mcg into inhaler and inhale daily.  . verapamil (CALAN-SR) 240 MG CR tablet Take 240 mg by mouth daily.  . Vitamin D, Ergocalciferol, (DRISDOL) 1.25 MG (50000 UT) CAPS capsule TAKE 1 CAPSULE BY MOUTH ONCE A WEEK.  Marland Kitchen zolpidem (AMBIEN) 10 MG tablet Take 10 mg by mouth at bedtime as needed for sleep.   . [DISCONTINUED] GLOBAL EASE INJECT PEN NEEDLES 31G X 8 MM MISC USE 5 TIMES DAILY.  . [DISCONTINUED] HUMULIN R U-500 KWIKPEN 500 UNIT/ML kwikpen INJECT 100 UNITS INTO THE SKIN 3 TIMES DAILY WITH MEALS.  . [DISCONTINUED] insulin regular human CONCENTRATED (HUMULIN R U-500 KWIKPEN) 500 UNIT/ML kwikpen INJECT 110 UNITS SUBQ with breakfast and lunch. 100UNITS qhs   No facility-administered encounter medications on file as of 10/18/2018.    ALLERGIES: Allergies  Allergen Reactions  . Biaxin [Clarithromycin] Anaphylaxis and Shortness Of Breath  . Peanut-Containing Drug Products Anaphylaxis   VACCINATION STATUS:  There is no immunization history on file for this patient.  Diabetes He presents for his follow-up diabetic visit. He has type 2 diabetes mellitus. Onset time: He was diagnosed at approximate age of 48 years. His disease course has been fluctuating. There are no hypoglycemic associated symptoms. Pertinent negatives for hypoglycemia include no confusion, headaches, pallor or seizures. Associated symptoms include polydipsia and polyuria. Pertinent negatives for diabetes include no chest pain, no fatigue, no polyphagia and no weakness. There are no hypoglycemic complications. Symptoms are stable. Diabetic complications include PVD. Risk factors for coronary  artery disease include dyslipidemia, diabetes mellitus, family history, male sex, obesity and sedentary lifestyle. Current diabetic treatment includes intensive insulin program. He is compliant with treatment most of the time. He is following a generally unhealthy diet. He has had a previous visit with a dietitian. He never participates in exercise. His home blood glucose trend is fluctuating minimally. His breakfast blood glucose range is generally 180-200 mg/dl. His lunch blood glucose range is generally 180-200 mg/dl. His dinner blood  glucose range is generally 180-200 mg/dl. His bedtime blood glucose range is generally 180-200 mg/dl. His overall blood glucose range is 180-200 mg/dl. An ACE inhibitor/angiotensin II receptor blocker is being taken.  Hyperlipidemia This is a chronic problem. The current episode started more than 1 year ago. The problem is uncontrolled. Exacerbating diseases include diabetes and obesity. Associated symptoms include shortness of breath. Pertinent negatives include no chest pain or myalgias. Current antihyperlipidemic treatment includes statins. Risk factors for coronary artery disease include diabetes mellitus, dyslipidemia, hypertension, male sex, obesity and a sedentary lifestyle.  Hypertension This is a chronic problem. The current episode started more than 1 year ago. The problem is uncontrolled. Associated symptoms include shortness of breath. Pertinent negatives include no chest pain, headaches, neck pain or palpitations. Past treatments include ACE inhibitors and calcium channel blockers. The current treatment provides no improvement. Compliance problems include diet.  Hypertensive end-organ damage includes PVD.    Review of systems: Limited as above.  Objective:    There were no vitals taken for this visit.  Wt Readings from Last 3 Encounters:  06/18/18 (!) 441 lb 3.2 oz (200.1 kg)  02/16/18 (!) 437 lb 12.8 oz (198.6 kg)  11/10/17 (!) 437 lb (198.2 kg)       Results for orders placed or performed in visit on 06/18/18  Hemoglobin A1c  Result Value Ref Range   Hgb A1c MFr Bld 8.5 (H) <5.7 % of total Hgb   Mean Plasma Glucose 197 (calc)   eAG (mmol/L) 10.9 (calc)  COMPLETE METABOLIC PANEL WITH GFR  Result Value Ref Range   Glucose, Bld 185 (H) 65 - 99 mg/dL   BUN 14 7 - 25 mg/dL   Creat 4.090.75 8.110.70 - 9.141.33 mg/dL   GFR, Est Non African American 103 > OR = 60 mL/min/1.4073m2   GFR, Est African American 120 > OR = 60 mL/min/1.1673m2   BUN/Creatinine Ratio NOT APPLICABLE 6 - 22 (calc)   Sodium 138 135 - 146 mmol/L   Potassium 4.5 3.5 - 5.3 mmol/L   Chloride 103 98 - 110 mmol/L   CO2 29 20 - 32 mmol/L   Calcium 9.5 8.6 - 10.3 mg/dL   Total Protein 6.2 6.1 - 8.1 g/dL   Albumin 3.8 3.6 - 5.1 g/dL   Globulin 2.4 1.9 - 3.7 g/dL (calc)   AG Ratio 1.6 1.0 - 2.5 (calc)   Total Bilirubin 0.4 0.2 - 1.2 mg/dL   Alkaline phosphatase (APISO) 70 35 - 144 U/L   AST 23 10 - 35 U/L   ALT 26 9 - 46 U/L   Diabetic Labs (most recent): Lab Results  Component Value Date   HGBA1C 8.5 (H) 10/10/2018   HGBA1C 8.1 (H) 06/11/2018   HGBA1C 8.2 (H) 02/06/2018   Lipid Panel     Component Value Date/Time   CHOL 126 06/11/2018 0718   TRIG 137 06/11/2018 0718   HDL 35 (L) 06/11/2018 0718   CHOLHDL 3.6 06/11/2018 0718   VLDL 31 (H) 03/12/2015 1200   LDLCALC 69 06/11/2018 0718     Assessment & Plan:   1. Uncontrolled type 2 diabetes mellitus with diabetic peripheral angiopathy without gangrene, with long-term current use of insulin (HCC) - He has responded favorably to insulin U500, returns with improving glycemic profile and A1c of 8.5%, increasing from 8.1%.  However, progressively improving from 9.6%.     He remains at extremely high risk for more acute and chronic complications of diabetes which include CAD, CVA, CKD, retinopathy,  and neuropathy. These are all discussed in detail with the patient.   Glucose logs and insulin administration records  pertaining to this visit,  to be scanned into patient's records.  Recent labs reviewed.  - I have re-counseled the patient on diet management and weight loss  by adopting a carbohydrate restricted / protein rich  Diet.  -He still admits to dietary indiscretion including consumption of and sweetened beverages.  - he  admits there is a room for improvement in his diet and drink choices. -  Suggestion is made for him to avoid simple carbohydrates  from his diet including Cakes, Sweet Desserts / Pastries, Ice Cream, Soda (diet and regular), Sweet Tea, Candies, Chips, Cookies, Sweet Pastries,  Store Bought Juices, Alcohol in Excess of  1-2 drinks a day, Artificial Sweeteners, Coffee Creamer, and "Sugar-free" Products. This will help patient to have stable blood glucose profile and potentially avoid unintended weight gain.   - Patient is advised to stick to a routine mealtimes to eat 3 meals  a day and avoid unnecessary snacks ( to snack only to correct hypoglycemia).  -He continues to respond to treatment with Humulin U500.    -He is advised to increase Humulin U500  to 120 units with breakfast, 110 units with lunch, and 100 units after supper for pre-meal blood glucose readings of 90 or above milligrams per deciliter associated with strict monitoring of blood glucose 4 times a day-before meals and at bedtime.   -Patient is encouraged to call clinic for blood glucose levels less than 70 or above 300 mg /dl. -He is advised to continue metformin 1 g p.o. twice daily with breakfast and supper.  -He is also advised to continue Bydureon 2 mg subcutaneously weekly.  -He would benefit from a low-dose glipizide.  I discussed and added glipizide 5 mg XL p.o. daily at breakfast. - He is a perfect candidate for bariatric surgery, he has been hesitant to consider this option. -He is following with Norm Salt, CDE for DM education.  - Patient specific target  for A1c; LDL, HDL, Triglycerides, and  Waist  Circumference were discussed in detail.  2) BP/HTN: he is advised to home monitor blood pressure and report if > 140/90 on 2 separate readings.   He is advised to proceed with his benazepril to 10 mg p.o. daily, also has verapamil 240 mg p.o. daily for blood pressure treatment.   3) Lipids/HPL: Uncontrolled with his recent LDL level of 99.  He is advised to continue pravastatin 40 mg p.o. nightly.    Side effects and precautions discussed with him.    4)  Weight/Diet:  He has had no significant success in weight control.  He still admits to dietary indiscretion.   CDE consult in progress, exercise, and carbohydrates information provided.  He hesitates to consider bariatric surgery.  5) Chronic Care/Health Maintenance:  -Patient  is  on ACEI/ARB and Statin medications and encouraged to continue to follow up with Ophthalmology, Podiatrist at least yearly or according to recommendations, and advised to  stay away from smoking. I have recommended yearly flu vaccine and pneumonia vaccination at least every 5 years; and  sleep for at least 7 hours a day. -This patient cannot exercise optimally due to his heavy weight and comorbidities.  - I advised patient to maintain close follow up with Kari Baars, MD for primary care needs.   - Patient Care Time Today:  25 min, of which >50% was spent in  counseling and the  rest reviewing his  current and  previous labs/studies, previous treatments, his blood glucose readings, and medications' doses and developing a plan for long-term care based on the latest recommendations for standards of care.   [name] participated in the discussions, expressed understanding, and voiced agreement with the above plans.  All questions were answered to his satisfaction. he is encouraged to contact clinic should he have any questions or concerns prior to his return visit.  Follow up plan: -Return in about 3 months (around 01/18/2019) for Bring Meter and Logs- A1c in  Office, Include 8 log sheets.  Glade Lloyd, MD Phone: [phone]  Fax: [phone]  -  This note was partially dictated with voice recognition software. Similar sounding words can be transcribed inadequately or may not  be corrected upon review.  10/18/2018, 2:16 PM

## 2018-10-22 ENCOUNTER — Other Ambulatory Visit: Payer: Self-pay | Admitting: "Endocrinology

## 2018-10-30 ENCOUNTER — Other Ambulatory Visit (HOSPITAL_COMMUNITY): Payer: Self-pay | Admitting: Neurology

## 2018-10-30 ENCOUNTER — Ambulatory Visit (HOSPITAL_COMMUNITY)
Admission: RE | Admit: 2018-10-30 | Discharge: 2018-10-30 | Disposition: A | Discharge status: Home / Self Care | Payer: Medicaid Other | Source: Ambulatory Visit | Attending: Neurology | Admitting: Neurology

## 2018-10-30 ENCOUNTER — Other Ambulatory Visit: Payer: Self-pay

## 2018-10-30 DIAGNOSIS — M545 Low back pain, unspecified: Secondary | ICD-10-CM

## 2018-11-30 ENCOUNTER — Other Ambulatory Visit: Payer: Self-pay | Admitting: "Endocrinology

## 2018-12-04 ENCOUNTER — Other Ambulatory Visit: Payer: Self-pay | Admitting: "Endocrinology

## 2018-12-16 DIAGNOSIS — G4730 Sleep apnea, unspecified: Secondary | ICD-10-CM

## 2018-12-16 DIAGNOSIS — E1165 Type 2 diabetes mellitus with hyperglycemia: Secondary | ICD-10-CM

## 2018-12-16 DIAGNOSIS — I83891 Varicose veins of right lower extremities with other complications: Secondary | ICD-10-CM

## 2018-12-16 DIAGNOSIS — E662 Morbid (severe) obesity with alveolar hypoventilation: Secondary | ICD-10-CM

## 2018-12-16 DIAGNOSIS — J309 Allergic rhinitis, unspecified: Secondary | ICD-10-CM

## 2018-12-16 DIAGNOSIS — K2100 Gastro-esophageal reflux disease with esophagitis, without bleeding: Secondary | ICD-10-CM | POA: Insufficient documentation

## 2018-12-16 DIAGNOSIS — I10 Essential (primary) hypertension: Secondary | ICD-10-CM | POA: Insufficient documentation

## 2018-12-16 DIAGNOSIS — E669 Obesity, unspecified: Secondary | ICD-10-CM

## 2018-12-16 DIAGNOSIS — M159 Polyosteoarthritis, unspecified: Secondary | ICD-10-CM

## 2018-12-16 DIAGNOSIS — M109 Gout, unspecified: Secondary | ICD-10-CM | POA: Insufficient documentation

## 2018-12-16 DIAGNOSIS — F419 Anxiety disorder, unspecified: Secondary | ICD-10-CM

## 2018-12-18 ENCOUNTER — Other Ambulatory Visit: Payer: Self-pay | Admitting: "Endocrinology

## 2018-12-18 DIAGNOSIS — E1151 Type 2 diabetes mellitus with diabetic peripheral angiopathy without gangrene: Secondary | ICD-10-CM

## 2018-12-18 DIAGNOSIS — IMO0002 Reserved for concepts with insufficient information to code with codable children: Secondary | ICD-10-CM

## 2018-12-31 ENCOUNTER — Other Ambulatory Visit: Payer: Self-pay | Admitting: "Endocrinology

## 2019-01-22 ENCOUNTER — Ambulatory Visit (INDEPENDENT_AMBULATORY_CARE_PROVIDER_SITE_OTHER): Payer: Medicaid Other | Admitting: "Endocrinology

## 2019-01-22 ENCOUNTER — Encounter: Payer: Self-pay | Admitting: "Endocrinology

## 2019-01-22 ENCOUNTER — Other Ambulatory Visit: Payer: Self-pay

## 2019-01-22 VITALS — BP 140/79 | HR 80 | Ht 71.0 in | Wt >= 6400 oz

## 2019-01-22 DIAGNOSIS — Z794 Long term (current) use of insulin: Secondary | ICD-10-CM | POA: Diagnosis not present

## 2019-01-22 DIAGNOSIS — I10 Essential (primary) hypertension: Secondary | ICD-10-CM | POA: Diagnosis not present

## 2019-01-22 DIAGNOSIS — E1165 Type 2 diabetes mellitus with hyperglycemia: Secondary | ICD-10-CM

## 2019-01-22 DIAGNOSIS — E1151 Type 2 diabetes mellitus with diabetic peripheral angiopathy without gangrene: Secondary | ICD-10-CM

## 2019-01-22 DIAGNOSIS — E782 Mixed hyperlipidemia: Secondary | ICD-10-CM

## 2019-01-22 DIAGNOSIS — E559 Vitamin D deficiency, unspecified: Secondary | ICD-10-CM

## 2019-01-22 DIAGNOSIS — IMO0002 Reserved for concepts with insufficient information to code with codable children: Secondary | ICD-10-CM

## 2019-01-22 LAB — POCT GLYCOSYLATED HEMOGLOBIN (HGB A1C): Hemoglobin A1C: 7.7 % — AB (ref 4.0–5.6)

## 2019-01-22 MED ORDER — ACCU-CHEK GUIDE W/DEVICE KIT: 1.0000 | PACK | 0 refills | Status: AC

## 2019-01-22 MED ORDER — ACCU-CHEK GUIDE VI STRP: ORAL_STRIP | 2 refills | Status: DC

## 2019-01-22 NOTE — Patient Instructions (Signed)

## 2019-01-22 NOTE — Progress Notes (Signed)
01/22/2019   Endocrinology follow-up note  Subjective:    Patient ID: Aaron Potter, male    DOB: 01-15-1960, PCP Sinda Du, MD   Past Medical History:  Diagnosis Date  . Allergic rhinitis, unspecified   . Anxiety disorder, unspecified   . Asthma   . Back pain   . Balanitis   . Cellulitis   . COPD (chronic obstructive pulmonary disease) ([place])   . Diabetes mellitus without complication ([place])   . Essential (primary) hypertension   . Gastro-esophageal reflux disease with esophagitis   . Gout   . Gout, unspecified   . Hypertension   . Lichen sclerosus   . Mixed hyperlipidemia   . Morbid (severe) obesity with alveolar hypoventilation ([place])   . Obesity, morbid (more than 100 lbs over ideal weight or BMI > 40) ([facility])   . Obesity, unspecified   . Polyosteoarthritis, unspecified   . Sleep apnea   . Sleep apnea, unspecified   . Type 2 diabetes mellitus with hyperglycemia ([place])   . Urticaria   . 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 CHOLECYSTECTOMY     Social History   Socioeconomic History  . Marital status: Single    Spouse name: Not on file  . Number of children: Not on file  . Years of education: Not on file  . Highest education level: Not on file  Occupational History  . Not on file  Tobacco Use  . Smoking status: Never Smoker  . Smokeless tobacco: Never Used  Substance and Sexual Activity  . Alcohol use: No    Alcohol/week: 0.0 standard drinks  . Drug use: No  . Sexual activity: Yes  Other Topics Concern  . Not on file  Social History Narrative  . Not on file   Social Determinants of Health   Financial Resource Strain:   . Difficulty of Paying Living Expenses: Not on file  Food Insecurity:   . Worried About Charity fundraiser in the Last Year: Not on file  . Ran Out of Food in the Last Year: Not on file  Transportation Needs:   . Lack of Transportation (Medical):  Not on file  . Lack of Transportation (Non-Medical): Not on file  Physical Activity:   . Days of Exercise per Week: Not on file  . Minutes of Exercise per Session: Not on file  Stress:   . Feeling of Stress : Not on file  Social Connections:   . Frequency of Communication with Friends and Family: Not on file  . Frequency of Social Gatherings with Friends and Family: Not on file  . Attends Religious Services: Not on file  . Active Member of Clubs or Organizations: Not on file  . Attends Archivist Meetings: Not on file  . Marital Status: Not on file   Outpatient Encounter Medications as of 01/22/2019  Medication Sig  . ACCU-CHEK AVIVA PLUS test strip USE TO CHECK BLOOD SUGAR 4 TIMES DAILY.  Marland Kitchen albuterol (PROAIR HFA) 108 (90 BASE) MCG/ACT inhaler Inhale 2 puffs into the lungs every 4 (four) hours as needed for wheezing or shortness of breath. Sometimes only uses one puff  . allopurinol (ZYLOPRIM) 300 MG tablet Take 300 mg by mouth daily.    Marland Kitchen ALPRAZolam (XANAX) 1 MG tablet Take 1 mg by mouth 4 (four) times daily.    . benazepril (LOTENSIN) 10 MG tablet Take 1 tablet (10 mg total)  by mouth daily.  . benazepril (LOTENSIN) 5 MG tablet TAKE 1 TABLET BY MOUTH ONCE DAILY.  Marland Kitchen Blood Glucose Monitoring Suppl (ACCU-CHEK GUIDE) w/Device KIT 1 Piece by Does not apply route as directed.  Marland Kitchen BYDUREON BCISE 2 MG/0.85ML AUIJ INJECT 1 INJECTION INTO THE SKIN ONCE A WEEK.  . diphenhydrAMINE (BENADRYL) 25 mg capsule Take 50 mg by mouth every 6 (six) hours as needed for itching.  Marland Kitchen glipiZIDE (GLUCOTROL XL) 5 MG 24 hr tablet Take 1 tablet (5 mg total) by mouth daily with breakfast.  . glucose blood (ACCU-CHEK GUIDE) test strip Use as instructed  . HYDROcodone-acetaminophen (NORCO) 10-325 MG per tablet Take 1 tablet by mouth 4 (four) times daily.  . indomethacin (INDOCIN) 50 MG capsule Take 50 mg by mouth daily.   . Insulin Pen Needle (GLOBAL EASE INJECT PEN NEEDLES) 31G X 8 MM MISC USE 3 TIMES DAILY.   Marland Kitchen insulin regular human CONCENTRATED (HUMULIN R U-500 KWIKPEN) 500 UNIT/ML kwikpen INJECT 120 UNITS WITH BREAKFAST, 110 UNITS WITH LUNCH AND 100 UNITS WITH SUPPER.  . metFORMIN (GLUCOPHAGE) 500 MG tablet Take 1,000 mg by mouth 2 (two) times daily with a meal.   . Multiple Vitamin (MULTIVITAMIN WITH MINERALS) TABS tablet Take 1 tablet by mouth daily.  . pravastatin (PRAVACHOL) 40 MG tablet Take 40 mg by mouth daily.  . sildenafil (REVATIO) 20 MG tablet Take 20-100 mg by mouth as directed. 1 hour prior to intercourse  . tiotropium (SPIRIVA) 18 MCG inhalation capsule Place 18 mcg into inhaler and inhale daily.  . verapamil (CALAN-SR) 240 MG CR tablet Take 240 mg by mouth daily.  . Vitamin D, Ergocalciferol, (DRISDOL) 1.25 MG (50000 UT) CAPS capsule TAKE 1 CAPSULE BY MOUTH ONCE A WEEK.  Marland Kitchen zolpidem (AMBIEN) 10 MG tablet Take 10 mg by mouth at bedtime as needed for sleep.    No facility-administered encounter medications on file as of 01/22/2019.   ALLERGIES: Allergies  Allergen Reactions  . Biaxin [Clarithromycin] Anaphylaxis and Shortness Of Breath  . Peanut-Containing Drug Products Anaphylaxis   VACCINATION STATUS: Immunization History  Administered Date(s) Administered  . Influenza-Unspecified 10/18/2010, 10/18/2011, 10/18/2012, 12/11/2013, 09/11/2014, 10/13/2015, 09/06/2018    Diabetes He presents for his follow-up diabetic visit. He has type 2 diabetes mellitus. Onset time: He was diagnosed at approximate age of 35 years. His disease course has been improving. There are no hypoglycemic associated symptoms. Pertinent negatives for hypoglycemia include no confusion, headaches, pallor or seizures. Associated symptoms include polydipsia and polyuria. Pertinent negatives for diabetes include no chest pain, no fatigue, no polyphagia and no weakness. There are no hypoglycemic complications. Symptoms are improving. Diabetic complications include PVD. Risk factors for coronary artery disease include  dyslipidemia, diabetes mellitus, family history, male sex, obesity and sedentary lifestyle. Current diabetic treatment includes intensive insulin program. He is compliant with treatment most of the time. His weight is increasing steadily. He is following a generally unhealthy diet. When asked about meal planning, he reported none. He has had a previous visit with a dietitian. He never participates in exercise. His home blood glucose trend is decreasing steadily. His breakfast blood glucose range is generally 140-180 mg/dl. His lunch blood glucose range is generally 140-180 mg/dl. His dinner blood glucose range is generally 140-180 mg/dl. His bedtime blood glucose range is generally 140-180 mg/dl. His overall blood glucose range is 140-180 mg/dl. (He returns with significant improvement in his glycemic profile and A1c point-of-care today is 7.7%.) An ACE inhibitor/angiotensin II receptor blocker is being taken.  Hyperlipidemia This is a chronic problem. The current episode started more than 1 year ago. The problem is uncontrolled. Exacerbating diseases include diabetes and obesity. Associated symptoms include shortness of breath. Pertinent negatives include no chest pain or myalgias. Current antihyperlipidemic treatment includes statins. Risk factors for coronary artery disease include diabetes mellitus, dyslipidemia, hypertension, male sex, obesity and a sedentary lifestyle.  Hypertension This is a chronic problem. The current episode started more than 1 year ago. The problem is uncontrolled. Associated symptoms include shortness of breath. Pertinent negatives include no chest pain, headaches, neck pain or palpitations. Past treatments include ACE inhibitors and calcium channel blockers. The current treatment provides no improvement. Compliance problems include diet.  Hypertensive end-organ damage includes PVD.    Review of systems: Limited as above.  Objective:    BP 140/79   Pulse 80   Ht 5' 11"   (1.803 m)   Wt (!) 445 lb (201.9 kg)   BMI 62.06 kg/m   Wt Readings from Last 3 Encounters:  01/22/19 (!) 445 lb (201.9 kg)  06/18/18 (!) 441 lb 3.2 oz (200.1 kg)  02/16/18 (!) 437 lb 12.8 oz (198.6 kg)      Results for orders placed or performed in visit on 01/22/19  HgB A1c  Result Value Ref Range   Hemoglobin A1C 7.7 (A) 4.0 - 5.6 %   HbA1c POC (<> result, manual entry)     HbA1c, POC (prediabetic range)     HbA1c, POC (controlled diabetic range)     Diabetic Labs (most recent): Lab Results  Component Value Date   HGBA1C 7.7 (A) 01/22/2019   HGBA1C 8.5 (H) 10/10/2018   HGBA1C 8.1 (H) 06/11/2018   Lipid Panel     Component Value Date/Time   CHOL 126 06/11/2018 0718   TRIG 137 06/11/2018 0718   HDL 35 (L) 06/11/2018 0718   CHOLHDL 3.6 06/11/2018 0718   VLDL 31 (H) 03/12/2015 1200   LDLCALC 69 06/11/2018 0718     Assessment & Plan:   1. Uncontrolled type 2 diabetes mellitus with diabetic peripheral angiopathy without gangrene, with long-term current use of insulin ([place]) - He has responded favorably to insulin U500, returns with continued improvement in his glycemic profile and A1c of 7.7%, overall improving from 9.6%.    He remains at extremely high risk for more acute and chronic complications of diabetes which include CAD, CVA, CKD, retinopathy, and neuropathy. These are all discussed in detail with the patient.   Glucose logs and insulin administration records pertaining to this visit,  to be scanned into patient's records.  Recent labs reviewed.  - I have re-counseled the patient on diet management and weight loss  by adopting a carbohydrate restricted / protein rich  Diet.  -He still admits to dietary indiscretion including consumption of and sweetened beverages.  - he  admits there is a room for improvement in his diet and drink choices. -  Suggestion is made for him to avoid simple carbohydrates  from his diet including Cakes, Sweet Desserts / Pastries, Ice  Cream, Soda (diet and regular), Sweet Tea, Candies, Chips, Cookies, Sweet Pastries,  Store Bought Juices, Alcohol in Excess of  1-2 drinks a day, Artificial Sweeteners, Coffee Creamer, and "Sugar-free" Products. This will help patient to have stable blood glucose profile and potentially avoid unintended weight gain.  - Patient is advised to stick to a routine mealtimes to eat 3 meals  a day and avoid unnecessary snacks ( to snack only to correct hypoglycemia).  -  He continues to respond to treatment with Humulin U500.    -He is advised to continue Humulin  U500 120 units with breakfast, 110 units with lunch, and 100 units after supper for pre-meal blood glucose readings of 90 or above milligrams per deciliter associated with strict monitoring of blood glucose 4 times a day-before meals and at bedtime.   -Patient is encouraged to call clinic for blood glucose levels less than 70 or above 300 mg /dl. -He is advised to continue metformin 1 g p.o. twice daily with breakfast and supper.  -He is also advised to continue Bydureon 2 mg subcutaneously weekly.  -He would benefit from a low-dose glipizide. -He is benefiting from a low-dose glipizide.  Advised to continue glipizide primary except noted at breakfast  - He is a perfect candidate for bariatric surgery, he has been hesitant to consider this option. -He is following with Jearld Fenton, CDE for DM education.  - Patient specific target  for A1c; LDL, HDL, Triglycerides, and  Waist Circumference were discussed in detail.  2) BP/HTN: His blood pressure is controlled to target.   He is advised to proceed with his benazepril to 10 mg p.o. daily, also has verapamil 240 mg p.o. daily for blood pressure treatment.   3) Lipids/HPL: Uncontrolled with his recent LDL level of 99.  Advised to continue pravastatin 40 mg p.o. nightly .    Side effects and precautions discussed with him.    4)  Weight/Diet:  He has had no significant success in weight  control.  He still admits to dietary indiscretion.   CDE consult in progress, exercise, and carbohydrates information provided.  He hesitates to consider bariatric surgery.  5) Chronic Care/Health Maintenance:  -Patient  is  on ACEI/ARB and Statin medications and encouraged to continue to follow up with Ophthalmology, Podiatrist at least yearly or according to recommendations, and advised to  stay away from smoking. I have recommended yearly flu vaccine and pneumonia vaccination at least every 5 years; and  sleep for at least 7 hours a day. -This patient cannot exercise optimally due to his heavy weight and comorbidities.  - I advised patient to maintain close follow up with Sinda Du, MD for primary care needs.   - Time spent on this patient care encounter:  35 min, of which > 50% was spent in  counseling and the rest reviewing his blood glucose logs , discussing his hypoglycemia and hyperglycemia episodes, reviewing his current and  previous labs / studies  ( including abstraction from other facilities) and medications  doses and developing a  long term treatment plan and documenting his care.   Please refer to Patient Instructions for Blood Glucose Monitoring and Insulin/Medications Dosing Guide"  in media tab for additional information. Please  also refer to " Patient Self Inventory" in the Media  tab for reviewed elements of pertinent patient history.  Aaron Potter participated in the discussions, expressed understanding, and voiced agreement with the above plans.  All questions were answered to his satisfaction. he is encouraged to contact clinic should he have any questions or concerns prior to his return visit.   Follow up plan: -Return in about 4 months (around 05/22/2019) for Bring Meter and Logs- A1c in Office.  Aaron Lloyd, MD Phone: [phone]  Fax: [phone]  -  This note was partially dictated with voice recognition software. Similar sounding words can be transcribed  inadequately or may not  be corrected upon review.  01/22/2019, 11:32 AM

## 2019-01-26 ENCOUNTER — Other Ambulatory Visit: Payer: Self-pay | Admitting: "Endocrinology

## 2019-01-28 ENCOUNTER — Other Ambulatory Visit: Payer: Self-pay | Admitting: "Endocrinology

## 2019-02-11 ENCOUNTER — Other Ambulatory Visit: Payer: Self-pay | Admitting: "Endocrinology

## 2019-02-11 DIAGNOSIS — E1151 Type 2 diabetes mellitus with diabetic peripheral angiopathy without gangrene: Secondary | ICD-10-CM

## 2019-02-11 DIAGNOSIS — IMO0002 Reserved for concepts with insufficient information to code with codable children: Secondary | ICD-10-CM

## 2019-02-27 ENCOUNTER — Other Ambulatory Visit: Payer: Self-pay | Admitting: "Endocrinology

## 2019-03-18 ENCOUNTER — Other Ambulatory Visit: Payer: Self-pay | Admitting: "Endocrinology

## 2019-03-18 DIAGNOSIS — E1151 Type 2 diabetes mellitus with diabetic peripheral angiopathy without gangrene: Secondary | ICD-10-CM

## 2019-03-18 DIAGNOSIS — IMO0002 Reserved for concepts with insufficient information to code with codable children: Secondary | ICD-10-CM

## 2019-03-27 ENCOUNTER — Encounter: Payer: Self-pay | Admitting: Family Medicine

## 2019-03-27 ENCOUNTER — Other Ambulatory Visit: Payer: Self-pay

## 2019-03-27 ENCOUNTER — Ambulatory Visit (INDEPENDENT_AMBULATORY_CARE_PROVIDER_SITE_OTHER): Payer: Medicaid Other | Admitting: Family Medicine

## 2019-03-27 VITALS — BP 140/74 | HR 74 | Temp 97.7°F | Ht 69.0 in | Wt >= 6400 oz

## 2019-03-27 DIAGNOSIS — J449 Chronic obstructive pulmonary disease, unspecified: Secondary | ICD-10-CM | POA: Diagnosis not present

## 2019-03-27 DIAGNOSIS — E662 Morbid (severe) obesity with alveolar hypoventilation: Secondary | ICD-10-CM | POA: Diagnosis not present

## 2019-03-27 DIAGNOSIS — K2100 Gastro-esophageal reflux disease with esophagitis, without bleeding: Secondary | ICD-10-CM

## 2019-03-27 DIAGNOSIS — I10 Essential (primary) hypertension: Secondary | ICD-10-CM

## 2019-03-27 DIAGNOSIS — J45909 Unspecified asthma, uncomplicated: Secondary | ICD-10-CM | POA: Diagnosis not present

## 2019-03-27 DIAGNOSIS — R32 Unspecified urinary incontinence: Secondary | ICD-10-CM

## 2019-03-27 DIAGNOSIS — E569 Vitamin deficiency, unspecified: Secondary | ICD-10-CM

## 2019-03-27 DIAGNOSIS — G4730 Sleep apnea, unspecified: Secondary | ICD-10-CM

## 2019-03-27 MED ORDER — BENAZEPRIL HCL 10 MG PO TABS: 10.0000 mg | ORAL_TABLET | Freq: Every day | ORAL | 2 refills | Status: DC

## 2019-03-27 MED ORDER — ALBUTEROL SULFATE (2.5 MG/3ML) 0.083% IN NEBU: 2.5000 mg | INHALATION_SOLUTION | Freq: Four times a day (QID) | RESPIRATORY_TRACT | 1 refills | Status: DC | PRN

## 2019-03-27 MED ORDER — ALBUTEROL SULFATE HFA 108 (90 BASE) MCG/ACT IN AERS: 2.0000 | INHALATION_SPRAY | RESPIRATORY_TRACT | 2 refills | Status: DC | PRN

## 2019-03-27 MED ORDER — TIOTROPIUM BROMIDE MONOHYDRATE 18 MCG IN CAPS: 18.0000 ug | ORAL_CAPSULE | Freq: Every day | RESPIRATORY_TRACT | 2 refills | Status: DC

## 2019-03-27 NOTE — Patient Instructions (Addendum)
Fasting labwork  COVID-19 Vaccine Information can be found at: [email or web address] For questions related to vaccine distribution or appointments, please email [email or web address] or call [phone].    If you have lab work done today you will be contacted with your lab results within the next 2 weeks.  If you have not heard from [place] then please contact us. The fastest way to get your results is to register for My Chart.   IF you received an x-ray today, you will receive an invoice from [facility]. Please contact [facility] at [phone] with questions or concerns regarding your invoice.   IF you received labwork today, you will receive an invoice from [facility]. Please contact [facility] at [phone] with questions or concerns regarding your invoice.   Our billing staff will not be able to assist you with questions regarding bills from these companies.  You will be contacted with the lab results as soon as they are available. The fastest way to get your results is to activate your My Chart account. Instructions are located on the last page of this paperwork. If you have not heard from [place] regarding the results in 2 weeks, please contact this office.

## 2019-03-27 NOTE — Progress Notes (Signed)
New Patient Office Visit  Subjective:  Patient ID: Aaron Potter, male    DOB: 1960-11-01  Age: 59 y.o. MRN: [id number] Prescriptions Total Prescriptions: 69   Total Private Pay: 1   Fill Date ID   Written Drug Qty Days Prescriber Rx # Pharmacy Refill   Daily Dose* Pymt Type PMP    03/04/2019  1   01/21/2019  Oxycodone-Acetaminophn 7.5-325  105.00  27 Ay Pow   [id number]   Lay ([id number])   0  43.75 MME  Medicaid   [place]  03/04/2019  1   01/21/2019  Alprazolam 0.5 MG Tablet  60.00  30 Ay Pow   [id number]   Lay ([id number])   1  2.00 LME  Medicaid   [place]  01/04/2019  1   12/02/2018  Zolpidem Tartrate 5 MG Tablet  30.00  30 Ed Haw   [id number]   Lay ([id number])   1  0.25 LME  Medicaid   [place]  01/04/2019  1   12/24/2018  Oxycodone-Acetaminophn 7.5-325  105.00  27 Ay Pow   [id number]   Lay ([id number])   0  43.75 MME  Medicaid   [place]  01/04/2019  1   12/24/2018  Alprazolam 0.5 MG Tablet  60.00  30 Ay Pow   [id number]   Lay ([id number])   0  2.00 LME  Medicaid   [place]  12/05/2018  1   11/27/2018  Oxycodone-Acetaminophen 5-325  105.00  27 Ay Pow   [id number]   Lay ([id number])   0  29.17 MME  Medicaid   [place]  12/04/2018  1   11/27/2018  Alprazolam 0.5 MG Tablet  60.00  30 Ay Pow   [id number]   Lay ([id number])   0  2.00 LME  Medicaid   [place]  12/04/2018  1   12/02/2018  Zolpidem Tartrate 5 MG Tablet  30.00  30 Ed Haw   [id number]   Lay ([id number])   0  0.25 LME  Medicaid   [place]  11/04/2018  1   10/30/2018  Zolpidem Tartrate 5 MG Tablet  30.00  30 Ed Haw   [id number]   Lay ([id number])   0  0.25 LME  Medicaid   [place]  11/04/2018  1   10/24/2018  Hydrocodone-Acetamin 10-325 MG  120.00  30 Ay Pow   [id number]   Lay ([id number])   0  40.00 MME  Medicaid   [place]  11/04/2018  1   10/24/2018  Alprazolam 0.5 MG Tablet  60.00  30 Ay Pow   [id number]   Lay ([id number])   0  2.00 LME  Medicaid   [place]  10/04/2018  1   05/04/2018  Zolpidem Tartrate 10 MG Tablet  30.00  30 Ed Haw   [id number]   Lay ([id number])   3  0.50 LME  Medicaid   [place]  10/04/2018  1   09/28/2018  Hydrocodone-Acetamin 10-325 MG  120.00  30 Ed Haw   [id number]    Lay ([id number])   0  40.00 MME  Medicaid   [place]  10/04/2018  1   09/28/2018  Alprazolam 0.5 MG Tablet  60.00  30 Ed Haw   [id number]   Lay ([id number])   0  2.00 LME  Medicaid   [place]  09/04/2018  1   05/04/2018  Zolpidem Tartrate 10 MG Tablet  30.00  30 Ed Haw   [id number]   Lay ([id number])   2  0.50 LME  Medicaid     09/04/2018  1   08/29/2018  Alprazolam 0.5 MG Tablet  90.00  30 Ed Haw   [id number]   Lay ([id number])   0  3.00 LME  Medicaid   [place]  09/04/2018  1   08/29/2018  Hydrocodone-Acetamin 10-325 MG  120.00  30 Ed Haw   [id number]   Lay ([id number])   0  40.00 MME  Medicaid   [place]  08/04/2018  1   08/02/2018  Alprazolam 0.5 MG Tablet  120.00  30 Ed Haw   [id number]   Lay ([id number])   0  4.00 LME  Medicaid   [place]  08/04/2018  1   05/04/2018  Zolpidem Tartrate 10 MG Tablet  30.00  30 Ed Haw   [id number]   Lay ([id number])   1  0.50 LME  Medicaid   [place]  08/04/2018  1   07/30/2018  Hydrocodone-Acetamin 10-325 MG  120.00  30 Ed Haw   [id number]   Lay ([id number])   0  40.00 MME  Medicaid   [place]  07/04/2018  1   07/03/2018  Alprazolam 1 MG Tablet  120.00  30 Ed Haw   [id number]   Lay ([id number])   0  8.00 LME  Medicaid     07/04/2018  1   05/31/2018  Zolpidem Tartrate 10 MG Tablet  30.00  30 Ed Haw   [id number]   Lay ([id number])   0  0.50 LME       CC:  Chief Complaint  Patient presents with  . Establish Care    follow up and check rx  sleep apnea Asthma/COPD/apnea Back pain-arthritis-neurology follow for pain management and rx-Xanax  HPI Tate Zagal presents for Pulmonary referral-sleep apnea/asthma/COPD-no tob use DM-endocrinology HTN-Lotensin Vilma Prader /Verapamil-prn/Potassium-renal function normal Hyperlipidemia-pravachol daily-LDL 54 Vit D-weekly GERD-peptobismol  Past Medical History:  Diagnosis Date  . Allergic rhinitis, unspecified   . Allergy   . Anxiety disorder, unspecified   . Asthma   . Back pain   . Balanitis   . Cellulitis   . COPD (chronic obstructive pulmonary disease) ([place])   . Diabetes mellitus without complication ([place])   . Essential  (primary) hypertension   . Gastro-esophageal reflux disease with esophagitis   . Gout   . Gout, unspecified   . Hypertension   . Lichen sclerosus   . Mixed hyperlipidemia   . Morbid (severe) obesity with alveolar hypoventilation ([place])   . Obesity, morbid (more than 100 lbs over ideal weight or BMI > 40) (HCC)   . Obesity, unspecified   . Polyosteoarthritis, unspecified   . Sleep apnea   . Sleep apnea, unspecified   . Type 2 diabetes mellitus with hyperglycemia ([place])   . Urticaria   . 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 CHOLECYSTECTOMY    . FOOT SURGERY Right 04/2017    Family History  Problem Relation Age of Onset  . Urticaria Sister   . Diabetes Mother   . Diabetes Father     Social History   Socioeconomic History  . Marital status: Single    Spouse name: Not on file  . Number of children: Not on file  . Years of education: Not on file  . Highest education level: Not on file  Occupational History  . Not on file  Tobacco Use  . Smoking status: Never Smoker  . Smokeless tobacco: Never Used  Substance and Sexual Activity  . Alcohol use: No  Alcohol/week: 0.0 standard drinks  . Drug use: No  . Sexual activity: Yes  Other Topics Concern  . Not on file  Social History Narrative  . Not on file   Social Determinants of Health   Financial Resource Strain:   . Difficulty of Paying Living Expenses:   Food Insecurity:   . Worried About Charity fundraiser in the Last Year:   . Arboriculturist in the Last Year:   Transportation Needs:   . Film/video editor (Medical):   Marland Kitchen Lack of Transportation (Non-Medical):   Physical Activity:   . Days of Exercise per Week:   . Minutes of Exercise per Session:   Stress:   . Feeling of Stress :   Social Connections:   . Frequency of Communication with Friends and Family:   . Frequency of Social Gatherings with Friends and Family:    . Attends Religious Services:   . Active Member of Clubs or Organizations:   . Attends Archivist Meetings:   Marland Kitchen Marital Status:   Intimate Partner Violence:   . Fear of Current or Ex-Partner:   . Emotionally Abused:   Marland Kitchen Physically Abused:   . Sexually Abused:     ROS Review of Systems  Constitutional: Negative.   Eyes:       Glasses  Respiratory:       Asthma/COPD/Apnea  Gastrointestinal:       GERD  Endocrine:       Endo following  Genitourinary:       Incontinence-night only-episodic  Musculoskeletal: Positive for arthralgias, back pain and gait problem.  Allergic/Immunologic: Positive for environmental allergies.  Psychiatric/Behavioral: Negative.     Objective:   Today's Vitals: BP 140/74 (BP Location: Right Arm, Patient Position: Sitting, Cuff Size: Large)   Pulse 74   Temp 97.7 F (36.5 C) (Temporal)   Ht 5' 9"  (1.753 m)   Wt (!) 454 lb 3.2 oz (206 kg)   SpO2 95%   BMI 67.07 kg/m   Physical Exam Constitutional:      Appearance: Normal appearance. He is normal weight.  HENT:     Head: Normocephalic and atraumatic.  Cardiovascular:     Rate and Rhythm: Normal rate and regular rhythm.     Pulses: Normal pulses.     Heart sounds: Normal heart sounds.  Pulmonary:     Effort: Pulmonary effort is normal.     Breath sounds: Normal breath sounds.  Musculoskeletal:     Cervical back: Normal range of motion and neck supple.  Neurological:     Mental Status: He is alert and oriented to person, place, and time.  Psychiatric:        Mood and Affect: Mood normal.        Behavior: Behavior normal.     Assessment & Plan:   1. Chronic obstructive pulmonary disease, unspecified COPD type ([place]) - Ambulatory referral to Pulmonology Ventolin/spiriva 2. Uncomplicated asthma, unspecified asthma severity, unspecified whether persistent - Ambulatory referral to Pulmonology  3. Gastroesophageal reflux disease with esophagitis, unspecified whether  hemorrhage pepto  4. Morbid (severe) obesity with alveolar hypoventilation (HCC) Difficulty breathing  5. Sleep apnea, unspecified type Pulmonary referral-CPAP  6. Vitamin D deficiency Weekly medication  7. Essential hypertension, benign Lotensin-daily-CMP, LE edema Outpatient Encounter Medications as of 03/27/2019  Medication Sig  . ACCU-CHEK AVIVA PLUS test strip USE TO CHECK BLOOD SUGAR 4 TIMES DAILY.  Marland Kitchen albuterol (PROAIR HFA) 108 (90 BASE) MCG/ACT inhaler Inhale 2  puffs into the lungs every 4 (four) hours as needed for wheezing or shortness of breath. Sometimes only uses one puff  . allopurinol (ZYLOPRIM) 300 MG tablet Take 300 mg by mouth daily.    Marland Kitchen ALPRAZolam (XANAX) 1 MG tablet Take 1 mg by mouth 4 (four) times daily.    . benazepril (LOTENSIN) 10 MG tablet Take 1 tablet (10 mg total) by mouth daily.  . benazepril (LOTENSIN) 5 MG tablet Take 2 tablets (10 mg total) by mouth daily.  . Blood Glucose Monitoring Suppl (ACCU-CHEK GUIDE) w/Device KIT 1 Piece by Does not apply route as directed.  Marland Kitchen BYDUREON BCISE 2 MG/0.85ML AUIJ INJECT 1 INJECTION INTO THE SKIN ONCE A WEEK.  . diclofenac Sodium (VOLTAREN) 1 % GEL Apply topically 4 (four) times daily.  . diphenhydrAMINE (BENADRYL) 25 mg capsule Take 50 mg by mouth every 6 (six) hours as needed for itching.  . furosemide (LASIX) 40 MG tablet Take 40 mg by mouth.  Marland Kitchen glipiZIDE (GLUCOTROL XL) 5 MG 24 hr tablet TAKE 1 TABLET BY MOUTH DAILY WITH BREAKFAST.  Marland Kitchen GLOBAL EASE INJECT PEN NEEDLES 31G X 8 MM MISC USE 3 TIMES DAILY.  Marland Kitchen glucose blood (ACCU-CHEK GUIDE) test strip Use as instructed  . HUMULIN R U-500 KWIKPEN 500 UNIT/ML kwikpen INJECT 120 UNITS WITH BREAKFAST, 110 UNITS WITH LUNCH AND 100 UNITS WITH SUPPER.  . indomethacin (INDOCIN) 50 MG capsule Take 50 mg by mouth daily.   . metFORMIN (GLUCOPHAGE) 500 MG tablet Take 1,000 mg by mouth 2 (two) times daily with a meal.   . Multiple Vitamin (MULTIVITAMIN WITH MINERALS) TABS tablet Take  1 tablet by mouth daily.  Marland Kitchen oxyCODONE-acetaminophen (PERCOCET) 7.5-325 MG tablet Take 1 tablet by mouth every 4 (four) hours as needed for severe pain.  . potassium chloride (KLOR-CON) 10 MEQ tablet Take 20 mEq by mouth daily.  . pravastatin (PRAVACHOL) 40 MG tablet Take 40 mg by mouth daily.  Marland Kitchen tiotropium (SPIRIVA) 18 MCG inhalation capsule Place 18 mcg into inhaler and inhale daily.  . verapamil (CALAN-SR) 240 MG CR tablet Take 240 mg by mouth daily.  . Vitamin D, Ergocalciferol, (DRISDOL) 1.25 MG (50000 UT) CAPS capsule TAKE 1 CAPSULE BY MOUTH ONCE A WEEK.  . [DISCONTINUED] HYDROcodone-acetaminophen (NORCO) 10-325 MG per tablet Take 1 tablet by mouth 4 (four) times daily.  . [DISCONTINUED] sildenafil (REVATIO) 20 MG tablet Take 20-100 mg by mouth as directed. 1 hour prior to intercourse  . [DISCONTINUED] zolpidem (AMBIEN) 10 MG tablet Take 10 mg by mouth at bedtime as needed for sleep.    No facility-administered encounter medications on file as of 03/27/2019.  1. Chronic obstructive pulmonary disease, unspecified COPD type ([place]) - Ambulatory referral to Pulmonology  2. Uncomplicated asthma, unspecified asthma severity, unspecified whether persistent - Ambulatory referral to Pulmonology  3. Gastroesophageal reflux disease with esophagitis, unspecified whether hemorrhage - Ambulatory referral to Gastroenterology  4. Morbid (severe) obesity with alveolar hypoventilation (HCC) Encouraged pt to walk-low/slow 5. Sleep apnea, unspecified type CPAP  6. Vitamin D deficiency Weekly Vit D - VITAMIN D 25 Hydroxy (Vit-D Deficiency, Fractures) 7. Essential hypertension, benign Lotensin-suggest Lasix for 3 days to decrease swelling 3+LE - COMPLETE METABOLIC PANEL WITH GFR - Lipid panel - CBC w/Diff/Platelet - TSH  8. Urinary incontinence, unspecified type - Ambulatory referral to Urology - PSA  Follow-up: gastro/pulmonary/urology 32mn spent discussing history, review of old records and  labwork, physical, assessment and plan Alane Hanssen LHannah Beat MD

## 2019-03-28 ENCOUNTER — Encounter: Payer: Self-pay | Admitting: Internal Medicine

## 2019-03-30 ENCOUNTER — Other Ambulatory Visit: Payer: Self-pay | Admitting: "Endocrinology

## 2019-03-30 DIAGNOSIS — E1151 Type 2 diabetes mellitus with diabetic peripheral angiopathy without gangrene: Secondary | ICD-10-CM

## 2019-03-30 DIAGNOSIS — IMO0002 Reserved for concepts with insufficient information to code with codable children: Secondary | ICD-10-CM

## 2019-04-01 ENCOUNTER — Other Ambulatory Visit: Payer: Self-pay | Admitting: Family Medicine

## 2019-04-01 LAB — COMPLETE METABOLIC PANEL WITH GFR
AG Ratio: 1.5 (calc) (ref 1.0–2.5)
ALT: 31 U/L (ref 9–46)
AST: 32 U/L (ref 10–35)
Albumin: 4 g/dL (ref 3.6–5.1)
Alkaline phosphatase (APISO): 74 U/L (ref 35–144)
BUN: 13 mg/dL (ref 7–25)
CO2: 28 mmol/L (ref 20–32)
Calcium: 9.5 mg/dL (ref 8.6–10.3)
Chloride: 104 mmol/L (ref 98–110)
Creat: 0.77 mg/dL (ref 0.70–1.33)
GFR, Est African American: 118 mL/min/{1.73_m2} (ref >=60)
GFR, Est Non African American: 101 mL/min/{1.73_m2} (ref >=60)
Globulin: 2.6 g/dL (calc) (ref 1.9–3.7)
Glucose, Bld: 215 mg/dL — ABNORMAL HIGH (ref 65–99)
Potassium: 4.6 mmol/L (ref 3.5–5.3)
Sodium: 141 mmol/L (ref 135–146)
Total Bilirubin: 0.4 mg/dL (ref 0.2–1.2)
Total Protein: 6.6 g/dL (ref 6.1–8.1)

## 2019-04-01 LAB — CBC WITH DIFFERENTIAL/PLATELET
Absolute Monocytes: 684 cells/uL (ref 200–950)
Basophils Absolute: 63 cells/uL (ref 0–200)
Basophils Relative: 0.7 %
Eosinophils Absolute: 324 cells/uL (ref 15–500)
Eosinophils Relative: 3.6 %
HCT: 45.6 % (ref 38.5–50.0)
Hemoglobin: 14.8 g/dL (ref 13.2–17.1)
Lymphs Abs: 2484 cells/uL (ref 850–3900)
MCH: 30.3 pg (ref 27.0–33.0)
MCHC: 32.5 g/dL (ref 32.0–36.0)
MCV: 93.4 fL (ref 80.0–100.0)
MPV: 10.6 fL (ref 7.5–12.5)
Monocytes Relative: 7.6 %
Neutro Abs: 5445 cells/uL (ref 1500–7800)
Neutrophils Relative %: 60.5 %
Platelets: 316 10*3/uL (ref 140–400)
RBC: 4.88 10*6/uL (ref 4.20–5.80)
RDW: 13.1 % (ref 11.0–15.0)
Total Lymphocyte: 27.6 %
WBC: 9 10*3/uL (ref 3.8–10.8)

## 2019-04-01 LAB — LIPID PANEL
Cholesterol: 137 mg/dL (ref <200)
HDL: 36 mg/dL — ABNORMAL LOW (ref >=40)
LDL Cholesterol (Calc): 79 mg/dL (calc)
Non-HDL Cholesterol (Calc): 101 mg/dL (calc) (ref <130)
Total CHOL/HDL Ratio: 3.8 (calc) (ref <5.0)
Triglycerides: 121 mg/dL (ref <150)

## 2019-04-01 LAB — TSH: TSH: 2.76 mIU/L (ref 0.40–4.50)

## 2019-04-01 LAB — VITAMIN D 25 HYDROXY (VIT D DEFICIENCY, FRACTURES): Vit D, 25-Hydroxy: 36 ng/mL (ref 30–100)

## 2019-04-01 LAB — PSA: PSA: <0.1 ng/mL (ref <=4.0)

## 2019-04-01 MED ORDER — VITAMIN D (ERGOCALCIFEROL) 1.25 MG (50000 UNIT) PO CAPS: 50000.0000 [IU] | ORAL_CAPSULE | ORAL | 3 refills | Status: DC

## 2019-04-02 ENCOUNTER — Encounter: Payer: Self-pay | Admitting: Emergency Medicine

## 2019-04-02 ENCOUNTER — Other Ambulatory Visit: Payer: Self-pay | Admitting: "Endocrinology

## 2019-04-08 ENCOUNTER — Other Ambulatory Visit: Payer: Self-pay

## 2019-04-08 DIAGNOSIS — I10 Essential (primary) hypertension: Secondary | ICD-10-CM

## 2019-04-08 MED ORDER — FUROSEMIDE 40 MG PO TABS: 40.0000 mg | ORAL_TABLET | Freq: Every day | ORAL | 0 refills | Status: DC

## 2019-04-15 ENCOUNTER — Other Ambulatory Visit: Payer: Self-pay | Admitting: "Endocrinology

## 2019-04-16 ENCOUNTER — Other Ambulatory Visit: Payer: Self-pay

## 2019-04-16 MED ORDER — ACCU-CHEK FASTCLIX LANCETS MISC: 1.0000 | Freq: Four times a day (QID) | 1 refills | Status: DC

## 2019-04-19 ENCOUNTER — Encounter: Payer: Self-pay | Admitting: Gastroenterology

## 2019-04-19 ENCOUNTER — Ambulatory Visit (INDEPENDENT_AMBULATORY_CARE_PROVIDER_SITE_OTHER): Payer: Medicaid Other | Admitting: Gastroenterology

## 2019-04-19 ENCOUNTER — Other Ambulatory Visit: Payer: Self-pay

## 2019-04-19 DIAGNOSIS — R1310 Dysphagia, unspecified: Secondary | ICD-10-CM | POA: Diagnosis not present

## 2019-04-19 DIAGNOSIS — K219 Gastro-esophageal reflux disease without esophagitis: Secondary | ICD-10-CM

## 2019-04-19 MED ORDER — PANTOPRAZOLE SODIUM 40 MG PO TBEC: 40.0000 mg | DELAYED_RELEASE_TABLET | Freq: Every day | ORAL | 3 refills | Status: DC

## 2019-04-19 NOTE — Progress Notes (Signed)
Primary Care Physician:  Maryruth Hancock, MD  Referring Physician: Dr. Benny Lennert  Primary Gastroenterologist:  Dr. Gala Romney   Chief Complaint  Patient presents with  . Gastroesophageal Reflux    c/o bloating, belching with eating, can cough food up hours after eating. Takes pepto for symptoms "big gulp out of bottle", can go through a large bottle in a month    HPI:   Aaron Potter is a 59 y.o. male presenting today at the request of Dr. Benny Lennert in consultation for GERD. No prior colonoscopy or EGD.   Reports chronic GERD dating back many years. In remote past took Nexium. Currently not on a PPI. Drinks large amounts of pepto daily. States he doesn't eat right. Gained 40 lbs over past year. In past, has regurgitated food from a few hours prior. Not often. No N/V. No early satiety. Notes occasional solid food dysphagia but states this is not often. No pill dysphagia. No abdominal pain. Flares of GERD depending on what he eats. No overt GI bleeding. Last A1c 7.8 in Jan 2021, which is improved from 8 range 6 months prior. Not interested in EGD at this time. Wonders about Protonix, which his sister takes.     Past Medical History:  Diagnosis Date  . Allergic rhinitis, unspecified   . Allergy   . Anxiety disorder, unspecified   . Asthma   . Back pain   . Balanitis   . Cellulitis   . COPD (chronic obstructive pulmonary disease) ([place])   . Diabetes mellitus without complication ([place])   . Essential (primary) hypertension   . Gastro-esophageal reflux disease with esophagitis   . Gout   . Gout, unspecified   . Hypertension   . Lichen sclerosus   . Mixed hyperlipidemia   . Morbid (severe) obesity with alveolar hypoventilation ([place])   . Obesity, morbid (more than 100 lbs over ideal weight or BMI > 40) (HCC)   . Obesity, unspecified   . Polyosteoarthritis, unspecified   . Sleep apnea   . Sleep apnea, unspecified   . Type 2 diabetes mellitus with hyperglycemia ([place])   . Urticaria     . 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 CHOLECYSTECTOMY    . FOOT SURGERY Right 04/2017    Current Outpatient Medications  Medication Sig Dispense Refill  . ACCU-CHEK AVIVA PLUS test strip USE TO CHECK BLOOD SUGAR 4 TIMES DAILY. 150 strip 5  . Accu-Chek FastClix Lancets MISC USE TO CHECK BLOOD SUGAR ONCE A DAY. 102 each 0  . Accu-Chek FastClix Lancets MISC 1 each by Does not apply route 4 (four) times daily. 200 each 1  . albuterol (PROAIR HFA) 108 (90 Base) MCG/ACT inhaler Inhale 2 puffs into the lungs every 4 (four) hours as needed for wheezing or shortness of breath. Sometimes only uses one puff 18 g 2  . albuterol (PROVENTIL) (2.5 MG/3ML) 0.083% nebulizer solution Take 3 mLs (2.5 mg total) by nebulization every 6 (six) hours as needed for wheezing or shortness of breath. 150 mL 1  . allopurinol (ZYLOPRIM) 300 MG tablet Take 300 mg by mouth daily.      Marland Kitchen ALPRAZolam (XANAX) 1 MG tablet Take 1 mg by mouth 4 (four) times daily.      . benazepril (LOTENSIN) 10 MG tablet Take 1 tablet (10 mg total) by mouth daily. 30 tablet 2  . Blood Glucose Monitoring Suppl (ACCU-CHEK  GUIDE) w/Device KIT 1 Piece by Does not apply route as directed. 1 kit 0  . BYDUREON BCISE 2 MG/0.85ML AUIJ INJECT INTO THE SKIN ONCE A WEEK. 3.4 mL 0  . diclofenac Sodium (VOLTAREN) 1 % GEL Apply topically 4 (four) times daily.    . diphenhydrAMINE (BENADRYL) 25 mg capsule Take 50 mg by mouth every 6 (six) hours as needed for itching.    . furosemide (LASIX) 40 MG tablet Take 1 tablet (40 mg total) by mouth daily. 30 tablet 0  . glipiZIDE (GLUCOTROL XL) 5 MG 24 hr tablet TAKE 1 TABLET BY MOUTH DAILY WITH BREAKFAST. 90 tablet 0  . glucose blood (ACCU-CHEK GUIDE) test strip Use as instructed 150 each 2  . HUMULIN R U-500 KWIKPEN 500 UNIT/ML kwikpen INJECT 120 UNITS WITH BREAKFAST, 110 UNITS WITH LUNCH AND 100 UNITS WITH SUPPER. 24 mL 0  .  indomethacin (INDOCIN) 50 MG capsule Take 50 mg by mouth daily.     . Insulin Pen Needle (GLOBAL EASE INJECT PEN NEEDLES) 31G X 8 MM MISC USE 4 TIMES DAILY. 200 each 0  . metFORMIN (GLUCOPHAGE) 500 MG tablet Take 1,000 mg by mouth 2 (two) times daily with a meal.     . Multiple Vitamin (MULTIVITAMIN WITH MINERALS) TABS tablet Take 1 tablet by mouth daily.    Marland Kitchen oxyCODONE-acetaminophen (PERCOCET) 7.5-325 MG tablet Take 1 tablet by mouth every 4 (four) hours as needed for severe pain.    . potassium chloride (KLOR-CON) 10 MEQ tablet Take 20 mEq by mouth daily.    . pravastatin (PRAVACHOL) 40 MG tablet Take 40 mg by mouth daily.    Marland Kitchen tiotropium (SPIRIVA) 18 MCG inhalation capsule Place 1 capsule (18 mcg total) into inhaler and inhale daily. 30 capsule 2  . verapamil (CALAN-SR) 240 MG CR tablet Take 240 mg by mouth daily.    . Vitamin D, Ergocalciferol, (DRISDOL) 1.25 MG (50000 UNIT) CAPS capsule Take 1 capsule (50,000 Units total) by mouth once a week. 4 capsule 3   No current facility-administered medications for this visit.    Allergies as of 04/19/2019 - Review Complete 04/19/2019  Allergen Reaction Noted  . Biaxin [clarithromycin] Anaphylaxis and Shortness Of Breath 04/06/2013  . Peanut-containing drug products Anaphylaxis 08/15/2014    Family History  Problem Relation Age of Onset  . Urticaria Sister   . Diabetes Mother   . Diabetes Father     Social History   Socioeconomic History  . Marital status: Single    Spouse name: Not on file  . Number of children: Not on file  . Years of education: Not on file  . Highest education level: Not on file  Occupational History  . Not on file  Tobacco Use  . Smoking status: Never Smoker  . Smokeless tobacco: Never Used  Substance and Sexual Activity  . Alcohol use: No    Alcohol/week: 0.0 standard drinks  . Drug use: No  . Sexual activity: Yes  Other Topics Concern  . Not on file  Social History Narrative  . Not on file   Social  Determinants of Health   Financial Resource Strain:   . Difficulty of Paying Living Expenses:   Food Insecurity:   . Worried About Charity fundraiser in the Last Year:   . Arboriculturist in the Last Year:   Transportation Needs:   . Film/video editor (Medical):   Marland Kitchen Lack of Transportation (Non-Medical):   Physical Activity:   .  Days of Exercise per Week:   . Minutes of Exercise per Session:   Stress:   . Feeling of Stress :   Social Connections:   . Frequency of Communication with Friends and Family:   . Frequency of Social Gatherings with Friends and Family:   . Attends Religious Services:   . Active Member of Clubs or Organizations:   . Attends Archivist Meetings:   Marland Kitchen Marital Status:   Intimate Partner Violence:   . Fear of Current or Ex-Partner:   . Emotionally Abused:   Marland Kitchen Physically Abused:   . Sexually Abused:     Review of Systems: Gen: Denies any fever, chills, fatigue, weight loss, lack of appetite.  CV: Denies chest pain, heart palpitations, peripheral edema, syncope.  Resp: Denies shortness of breath at rest or with exertion. Denies wheezing or cough.  GI: see HPI GU : Denies urinary burning, urinary frequency, urinary hesitancy MS: Denies joint pain, muscle weakness, cramps, or limitation of movement.  Derm: Denies rash, itching, dry skin Psych: Denies depression, anxiety, memory loss, and confusion Heme: Denies bruising, bleeding, and enlarged lymph nodes.  Physical Exam: BP (!) 160/67   Pulse 71   Temp (!) 97.3 F (36.3 C) (Oral)   Ht 5' 11"  (1.803 m)   Wt (!) 451 lb 9.6 oz (204.8 kg)   BMI 62.99 kg/m  General:   Alert and oriented. Pleasant and cooperative. Obese.  Head:  Normocephalic and atraumatic. Eyes:  Without icterus, sclera clear and conjunctiva pink.  Ears:  Normal auditory acuity. Mouth:  Mask in place Lungs:  Clear to auscultation bilaterally.  Heart:  S1, S2 present with faint systolic murmur.  Abdomen:  +BS, soft,  significantly enlarged AP diameter, obese, unable to lay down for abdominal exam Rectal:  Deferred  Msk:  Symmetrical without gross deformities. Normal posture. Extremities:  Without edema. Neurologic:  Alert and  oriented x4;  grossly normal neurologically. Psych:  Alert and cooperative. Normal mood and affect.  ASSESSMENT: Mcclain Shall is a 59 y.o. male presenting today with a history of chronic, uncontrolled GERD, intermittent solid food dysphagia, here for new patient evaluation.  Uncontrolled GERD multifactorial in setting of obesity, diet/behavior, and lack of PPI. We discussed weight loss, avoidance of trigger foods and healthy diet, and starting PPI. Recommend EGD as well due to multiple risk factors for Barrett's and reported dysphagia. He is declining this and would like to try just PPI first. We will start Protonix once daily and have him return in 3-4 months. I have asked him to call with any changes in interim.    PLAN:  Start Protonix once daily, may need to increase to BID  GERD diet provided  Recommend EGD/dilation when patient willing  Return in 3-4 months  Annitta Needs, PhD, [facility] Gastroenterology

## 2019-04-19 NOTE — Patient Instructions (Addendum)
Start taking Protonix once each morning, 30 minutes before breakfast on an empty stomach. Call with an update in about 2 weeks to let us know how it is working. We may need to increase the dose.  I have included a reflux handout. It is important to follow this to avoid flares and reduce risk of reflux symptoms.  I recommend an endoscopy. Let me know if you change your mind. We will see you back in 3-4 months!  It was a pleasure to see you today. I want to create trusting relationships with patients to provide genuine, compassionate, and quality care. I value your feedback. If you receive a survey regarding your visit,  I greatly appreciate you taking time to fill this out.   Gelene Mink, PhD, ANP-BC [facility]    Food Choices for Gastroesophageal Reflux Disease, Adult When you have gastroesophageal reflux disease (GERD), the foods you eat and your eating habits are very important. Choosing the right foods can help ease your discomfort. Think about working with a nutrition specialist (dietitian) to help you make good choices. What are tips for following this plan?  Meals  Choose healthy foods that are low in fat, such as fruits, vegetables, whole grains, low-fat dairy products, and lean meat, fish, and poultry.  Eat small meals often instead of 3 large meals a day. Eat your meals slowly, and in a place where you are relaxed. Avoid bending over or lying down until 2-3 hours after eating.  Avoid eating meals 2-3 hours before bed.  Avoid drinking a lot of liquid with meals.  Cook foods using methods other than frying. Bake, grill, or broil food instead.  Avoid or limit: ? Chocolate. ? Peppermint or spearmint. ? Alcohol. ? Pepper. ? Black and decaffeinated coffee. ? Black and decaffeinated tea. ? Bubbly (carbonated) soft drinks. ? Caffeinated energy drinks and soft drinks.  Limit high-fat foods such as: ? Fatty meat or fried foods. ? Whole milk, cream, butter, or  ice cream. ? Nuts and nut butters. ? Pastries, donuts, and sweets made with butter or shortening.  Avoid foods that cause symptoms. These foods may be different for everyone. Common foods that cause symptoms include: ? Tomatoes. ? Oranges, lemons, and limes. ? Peppers. ? Spicy food. ? Onions and garlic. ? Vinegar. Lifestyle  Maintain a healthy weight. Ask your doctor what weight is healthy for you. If you need to lose weight, work with your doctor to do so safely.  Exercise for at least 30 minutes for 5 or more days each week, or as told by your doctor.  Wear loose-fitting clothes.  Do not smoke. If you need help quitting, ask your doctor.  Sleep with the head of your bed higher than your feet. Use a wedge under the mattress or blocks under the bed frame to raise the head of the bed. Summary  When you have gastroesophageal reflux disease (GERD), food and lifestyle choices are very important in easing your symptoms.  Eat small meals often instead of 3 large meals a day. Eat your meals slowly, and in a place where you are relaxed.  Limit high-fat foods such as fatty meat or fried foods.  Avoid bending over or lying down until 2-3 hours after eating.  Avoid peppermint and spearmint, caffeine, alcohol, and chocolate. This information is not intended to replace advice given to you by your health care provider. Make sure you discuss any questions you have with your health care provider. Document Revised: 04/12/2018 Document  Reviewed: 01/26/2016 Elsevier Patient Education  [employer].

## 2019-05-01 LAB — HEMOGLOBIN A1C: Hemoglobin A1C: 7.8

## 2019-05-03 ENCOUNTER — Other Ambulatory Visit: Payer: Self-pay | Admitting: Family Medicine

## 2019-05-13 ENCOUNTER — Ambulatory Visit: Payer: Medicaid Other | Admitting: Urology

## 2019-05-15 ENCOUNTER — Ambulatory Visit: Payer: Medicaid Other | Admitting: Urology

## 2019-05-17 ENCOUNTER — Other Ambulatory Visit: Payer: Self-pay | Admitting: "Endocrinology

## 2019-05-17 DIAGNOSIS — IMO0002 Reserved for concepts with insufficient information to code with codable children: Secondary | ICD-10-CM

## 2019-05-20 ENCOUNTER — Ambulatory Visit (INDEPENDENT_AMBULATORY_CARE_PROVIDER_SITE_OTHER): Payer: Medicaid Other | Admitting: Internal Medicine

## 2019-05-20 ENCOUNTER — Other Ambulatory Visit: Payer: Self-pay

## 2019-05-20 ENCOUNTER — Encounter: Payer: Self-pay | Admitting: Internal Medicine

## 2019-05-20 VITALS — BP 144/64 | HR 71 | Temp 97.1°F | Ht 71.0 in | Wt >= 6400 oz

## 2019-05-20 DIAGNOSIS — R0609 Other forms of dyspnea: Secondary | ICD-10-CM | POA: Insufficient documentation

## 2019-05-20 DIAGNOSIS — R0600 Dyspnea, unspecified: Secondary | ICD-10-CM | POA: Diagnosis not present

## 2019-05-20 DIAGNOSIS — I10 Essential (primary) hypertension: Secondary | ICD-10-CM

## 2019-05-20 MED ORDER — CLONIDINE HCL 0.1 MG PO TABS: 0.1000 mg | ORAL_TABLET | Freq: Two times a day (BID) | ORAL | 11 refills | Status: DC

## 2019-05-20 NOTE — Progress Notes (Signed)
Aaron Potter, male    DOB: 12/24/1960, 59 y.o.   MRN: [id number]   Brief patient profile:  46 yowm never smoker with MO complicated by osa and asthma as child remembers being hospitalized and never able to play sports with issues with doe started to have to inhalers for asthma which helped starting in 20s and no better with spiriva but also on maint saba = neb at home and hfa if out referred to pulmonary clinic 05/20/2019 by Dr   Benny Lennert     History of Present Illness  05/20/2019  Pulmonary/ 1st office eval/Aaron Potter  Chief Complaint  Patient presents with  . Pulmonary Consult    Referred by Dr Benny Lennert.  Former patient of Dr Luan Pulling. He states since the weather has been getting warmer his breathing has been slightly worse. He has been wheezing some and has occ cough in the am's with white to clear sputum. He uses CPAP. He uses his proair inhaler 3-4 x per wk.    Dyspnea:  100 ft down and then back to landing s stopping legs/back and breathing stop in about the same time  Cough: in am's esp but  Min productive  Sleep: on cpap 3 pillows  SABA use: avg neb twice daily "but supposed to do it 4x" and confused thoroughly with maint vs prns 02 none   No obvious day to day or daytime variability or assoc excess/ purulent sputum or mucus plugs or hemoptysis or cp or chest tightness,  or overt sinus or hb symptoms.   sleepings as above  without nocturnal  or early am exacerbation  of respiratory  c/o's or need for noct saba. Also denies any obvious fluctuation of symptoms with weather or environmental changes or other aggravating or alleviating factors except as outlined above   No unusual exposure hx or h/o childhood pna  or knowledge of premature birth.  Current Allergies, Complete Past Medical History, Past Surgical History, Family History, and Social History were reviewed in [employer] record.  ROS  The following are not active complaints unless bolded Hoarseness,  sore throat, dysphagia, dental problems, itching, sneezing,  nasal congestion or discharge of excess mucus or purulent secretions, ear ache,   fever, chills, sweats, unintended wt loss or wt gain, classically pleuritic or exertional cp,  orthopnea pnd or arm/hand swelling  or leg swelling, presyncope, palpitations, abdominal pain, anorexia, nausea, vomiting, diarrhea  or change in bowel habits or change in bladder habits, change in stools or change in urine, dysuria, hematuria,  rash, arthralgias, visual complaints, headache, numbness, weakness or ataxia or problems with walking or coordination,  change in mood or  memory.           Past Medical History:  Diagnosis Date  . Allergic rhinitis, unspecified   . Allergy   . Anxiety disorder, unspecified   . Asthma   . Back pain   . Balanitis   . Cellulitis   . COPD (chronic obstructive pulmonary disease) ([place])   . Diabetes mellitus without complication ([place])   . Essential (primary) hypertension   . Gastro-esophageal reflux disease with esophagitis   . Gout   . Gout, unspecified   . Hypertension   . Lichen sclerosus   . Mixed hyperlipidemia   . Morbid (severe) obesity with alveolar hypoventilation ([place])   . Obesity, morbid (more than 100 lbs over ideal weight or BMI > 40) ([facility])   . Obesity, unspecified   . Polyosteoarthritis, unspecified   .  Sleep apnea   . Sleep apnea, unspecified    CPAP  . Type 2 diabetes mellitus with hyperglycemia ([place])   . Urticaria   . 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 ACCU-CHEK AVIVA PLUS test strip USE TO CHECK BLOOD SUGAR 4 TIMES DAILY. 150 strip 5  . Accu-Chek FastClix Lancets MISC USE TO CHECK BLOOD SUGAR ONCE A DAY. 102 each 0  . Accu-Chek FastClix Lancets MISC 1 each by Does not apply route 4 (four) times daily. 200 each 1  . albuterol (PROAIR HFA) 108 (90 Base) MCG/ACT inhaler Inhale 2 puffs into  the lungs every 4 (four) hours as needed for wheezing or shortness of breath. Sometimes only uses one puff 18 g 2  . albuterol (PROVENTIL) (2.5 MG/3ML) 0.083% nebulizer solution Take 3 mLs (2.5 mg total) by nebulization every 6 (six) hours as needed for wheezing or shortness of breath. 150 mL 1  . allopurinol (ZYLOPRIM) 300 MG tablet Take 300 mg by mouth daily.      Marland Kitchen ALPRAZolam (XANAX) 1 MG tablet Take 1 mg by mouth 4 (four) times daily.      . benazepril (LOTENSIN) 10 MG tablet Take 1 tablet (10 mg total) by mouth daily. 30 tablet 2  . Blood Glucose Monitoring Suppl (ACCU-CHEK GUIDE) w/Device KIT 1 Piece by Does not apply route as directed. 1 kit 0  . BYDUREON BCISE 2 MG/0.85ML AUIJ INJECT INTO THE SKIN ONCE A WEEK. 3.4 mL 0  . diclofenac Sodium (VOLTAREN) 1 % GEL Apply topically 4 (four) times daily.    . diphenhydrAMINE (BENADRYL) 25 mg capsule Take 50 mg by mouth every 6 (six) hours as needed for itching.    Marland Kitchen glipiZIDE (GLUCOTROL XL) 5 MG 24 hr tablet TAKE 1 TABLET BY MOUTH DAILY WITH BREAKFAST. 30 tablet 2  . GLOBAL EASE INJECT PEN NEEDLES 31G X 8 MM MISC USE 4 TIMES DAILY. 200 each 2  . glucose blood (ACCU-CHEK GUIDE) test strip Use as instructed 150 each 2  . HUMULIN R U-500 KWIKPEN 500 UNIT/ML kwikpen INJECT 120 UNITS WITH BREAKFAST, 110 UNITS WITH LUNCH AND 100 UNITS WITH SUPPER. 24 mL 2  . indomethacin (INDOCIN) 50 MG capsule Take 50 mg by mouth daily.     . metFORMIN (GLUCOPHAGE) 500 MG tablet Take 1,000 mg by mouth 2 (two) times daily with a meal.     . Multiple Vitamin (MULTIVITAMIN WITH MINERALS) TABS tablet Take 1 tablet by mouth daily.    Marland Kitchen oxyCODONE-acetaminophen (PERCOCET) 7.5-325 MG tablet Take 1 tablet by mouth every 4 (four) hours as needed for severe pain.    . pantoprazole (PROTONIX) 40 MG tablet Take 1 tablet (40 mg total) by mouth daily. 30 minutes before breakfast 90 tablet 3  . potassium chloride (KLOR-CON) 10 MEQ tablet Take 20 mEq by mouth daily.    . pravastatin  (PRAVACHOL) 40 MG tablet Take 40 mg by mouth daily.    Marland Kitchen tiotropium (SPIRIVA) 18 MCG inhalation capsule Place 1 capsule (18 mcg total) into inhaler and inhale daily. 30 capsule 2  . verapamil (CALAN-SR) 240 MG CR tablet Take 240 mg by mouth daily.    . Vitamin D, Ergocalciferol, (DRISDOL) 1.25 MG (50000 UNIT) CAPS capsule Take 1 capsule (50,000 Units total) by mouth once a week. 4 capsule 3  . furosemide (LASIX) 40 MG tablet Take 1 tablet (40 mg total) by mouth  daily. 30 tablet 0         Objective:     BP (!) 144/64 (BP Location: Left Arm, Cuff Size: Normal)   Pulse 71   Temp (!) 97.1 F (36.2 C) (Temporal)   Ht '5\' 11"'$  (1.803 m)   Wt (!) 436 lb (197.8 kg)   SpO2 95% Comment: on RA  BMI 60.81 kg/m   SpO2: 95 %(on RA)   Massively obese pleasant amb wm nad/ mild pseudowheeze   HEENT : pt wearing mask not removed for exam due to covid -19 concerns.    NECK :  without JVD/Nodes/TM/ nl carotid upstrokes bilaterally   LUNGS: no acc muscle use,  Nl contour chest which is clear to A and P bilaterally without cough on insp or exp maneuvers   CV:  RRR  no s3 or murmur or increase in P2, and 1+ sym LE  edema   ABD:  soft and nontender with nl inspiratory excursion in the supine position. No bruits or organomegaly appreciated, bowel sounds nl  MS:  Nl gait/ ext warm without deformities, calf tenderness, cyanosis or clubbing No obvious joint restrictions   SKIN: warm and dry without lesions    NEURO:  alert, approp, nl sensorium with  no motor or cerebellar deficits apparent.         Assessment   DOE (dyspnea on exertion) Onset "asthma" as child/ never smoker - Spirometry 07/18/12   FEV1 1.90 (48%)  Ratio 0.69 with atypical curve in effort dep portion not typical of true airflow obst - trial off spiriva/ acei 05/20/2019 >>>    Carries dx of asthma but treated at present like copd and doubt either dx applies here.   DDX of  difficult airways management almost all start with  A and  include Adherence, Ace Inhibitors, Acid Reflux, Active Sinus Disease, Alpha 1 Antitripsin deficiency, Anxiety masquerading as Airways dz,  ABPA,  Allergy(esp in young), Aspiration (esp in elderly), Adverse effects of meds,  Active smoking or vaping, A bunch of PE's (a small clot burden can't cause this syndrome unless there is already severe underlying pulm or vascular dz with poor reserve) plus two Bs  = Bronchiectasis and Beta blocker use..and one C= CHF   Adherence is always the initial "prime suspect" and is a multilayered concern that requires a "trust but verify" approach in every patient - starting with knowing how to use medications, especially inhalers, correctly, keeping up with refills and understanding the fundamental difference between maintenance and prns vs those medications only taken for a very short course and then stopped and not refilled.  - return for medication reconciliation.  To keep things simple, I have asked the patient to first separate medicines that are perceived as maintenance, that is to be taken daily "no matter what", from those medicines that are taken on only on an as-needed basis and I have given the patient examples of both, and then return to see me in 4 weeks and regroup at that point.   ACEi adverse effects at the  top of the usual list of suspects and the only way to rule it out is a trial off > see a/p    ? Allergies/ asthma - I spent extra time with pt today reviewing appropriate use of albuterol for prn use on exertion with the following points: 1) saba is for relief of sob that does not improve by walking a slower pace or resting but rather if the pt does not  improve after trying this first. 2) If the pt is convinced, as many are, that saba helps recover from activity faster then it's easy to tell if this is the case by re-challenging : ie stop, take the inhaler, then p 5 minutes try the exact same activity (intensity of workload) that just caused the  symptoms and see if they are substantially diminished or not after saba 3) if there is an activity that reproducibly causes the symptoms, try the saba 15 min before the activity on alternate days   If in fact the saba really does help, then fine to continue to use it prn but advised may need to look closer at the maintenance regimen being used to achieve better control of airways disease with exertion.   ? Anxiety/depression / wt gain  > usually at the bottom of this list of usual suspects but  note already on psychotropics and may interfere with adherence and also interpretation of response or lack thereof to symptom management which can be quite subjective.   ? Adverse effects of meds > stop dpi rx with spiriva as doubt this is copd anyway.  ? Acid (or non-acid) GERD > always difficult to exclude as up to 75% of pts in some series report no assoc GI/ Heartburn symptoms> rec continue PPI for now   Essential (primary) hypertension D/c ACEi 05/20/2019 > changed to clonidine for pain control component   In the best review of chronic cough to date ( NEJM 2016 375 7948-0165) ,  ACEi are now felt to cause cough in up to  20% of pts which is a 4 fold increase from previous reports and does not include the variety of non-specific complaints we see in pulmonary clinic in pts on ACEi but previously attributed to another dx like  Copd/asthma and  include PNDS, throat and chest congestion, "bronchitis", unexplained dyspnea and noct "strangling" sensations, and hoarseness, but also  atypical /refractory GERD symptoms like dysphagia and "bad heartburn"   The only way I know  to prove this is not an "ACEi Case" is a trial off ACEi x a minimum of 6 weeks then regroup.   He states he's as slowed by back pain as sob and needs trial off acei to see if helps non-specific resp complaints >> try clonidine 0.1 mg bid and reminded to keep taking lasix since has evidence of vol overload today when missed dose.       Morbid obesity ([place]) Body mass index is 60.81 kg/m.   Lab Results  Component Value Date   TSH 2.76 04/01/2019     Contributing to gerd risk/ doe/reviewed the need and the process to achieve and maintain neg calorie balance > defer f/u primary care including intermittently monitoring thyroid status           Each maintenance medication was reviewed in detail including emphasizing most importantly the difference between maintenance and prns and under what circumstances the prns are to be triggered using an action plan format where appropriate.  Total time for H and P, chart review, counseling,  and generating customized AVS unique to this office visit / charting = 60 min           Christinia Gully, MD 05/20/2019

## 2019-05-20 NOTE — Assessment & Plan Note (Addendum)
D/c ACEi 05/20/2019 > changed to clonidine for pain control component   In the best review of chronic cough to date ( NEJM 2016 375 [id number]) ,  ACEi are now felt to cause cough in up to  20% of pts which is a 4 fold increase from previous reports and does not include the variety of non-specific complaints we see in pulmonary clinic in pts on ACEi but previously attributed to another dx like  Copd/asthma and  include PNDS, throat and chest congestion, "bronchitis", unexplained dyspnea and noct "strangling" sensations, and hoarseness, but also  atypical /refractory GERD symptoms like dysphagia and "bad heartburn"   The only way I know  to prove this is not an "ACEi Case" is a trial off ACEi x a minimum of 6 weeks then regroup.   He states he's as slowed by back pain as sob and needs trial off acei to see if helps non-specific resp complaints >> try clonidine 0.1 mg bid and reminded to keep taking lasix since has evidence of vol overload today when missed dose.

## 2019-05-20 NOTE — Assessment & Plan Note (Addendum)
Onset "asthma" as child/ never smoker - Spirometry 07/18/12   FEV1 1.90 (48%)  Ratio 0.69 with atypical curve in effort dep portion not typical of true airflow obst - trial off spiriva/ acei 05/20/2019 >>>    Carries dx of asthma but treated at present like copd and doubt either dx applies here.   DDX of  difficult airways management almost all start with A and  include Adherence, Ace Inhibitors, Acid Reflux, Active Sinus Disease, Alpha 1 Antitripsin deficiency, Anxiety masquerading as Airways dz,  ABPA,  Allergy(esp in young), Aspiration (esp in elderly), Adverse effects of meds,  Active smoking or vaping, A bunch of PE's (a small clot burden can't cause this syndrome unless there is already severe underlying pulm or vascular dz with poor reserve) plus two Bs  = Bronchiectasis and Beta blocker use..and one C= CHF   Adherence is always the initial "prime suspect" and is a multilayered concern that requires a "trust but verify" approach in every patient - starting with knowing how to use medications, especially inhalers, correctly, keeping up with refills and understanding the fundamental difference between maintenance and prns vs those medications only taken for a very short course and then stopped and not refilled.  - return for medication reconciliation.  To keep things simple, I have asked the patient to first separate medicines that are perceived as maintenance, that is to be taken daily "no matter what", from those medicines that are taken on only on an as-needed basis and I have given the patient examples of both, and then return to see me in 4 weeks and regroup at that point.   ACEi adverse effects at the  top of the usual list of suspects and the only way to rule it out is a trial off > see a/p    ? Allergies/ asthma - I spent extra time with pt today reviewing appropriate use of albuterol for prn use on exertion with the following points: 1) saba is for relief of sob that does not improve by  walking a slower pace or resting but rather if the pt does not improve after trying this first. 2) If the pt is convinced, as many are, that saba helps recover from activity faster then it's easy to tell if this is the case by re-challenging : ie stop, take the inhaler, then p 5 minutes try the exact same activity (intensity of workload) that just caused the symptoms and see if they are substantially diminished or not after saba 3) if there is an activity that reproducibly causes the symptoms, try the saba 15 min before the activity on alternate days   If in fact the saba really does help, then fine to continue to use it prn but advised may need to look closer at the maintenance regimen being used to achieve better control of airways disease with exertion.   ? Anxiety/depression / wt gain  > usually at the bottom of this list of usual suspects but  note already on psychotropics and may interfere with adherence and also interpretation of response or lack thereof to symptom management which can be quite subjective.   ? Adverse effects of meds > stop dpi rx with spiriva as doubt this is copd anyway.  ? Acid (or non-acid) GERD > always difficult to exclude as up to 75% of pts in some series report no assoc GI/ Heartburn symptoms> rec continue PPI for now

## 2019-05-20 NOTE — Assessment & Plan Note (Signed)
Body mass index is 60.81 kg/m.   Lab Results  Component Value Date   TSH 2.76 04/01/2019     Contributing to gerd risk/ doe/reviewed the need and the process to achieve and maintain neg calorie balance > defer f/u primary care including intermittently monitoring thyroid status           Each maintenance medication was reviewed in detail including emphasizing most importantly the difference between maintenance and prns and under what circumstances the prns are to be triggered using an action plan format where appropriate.  Total time for H and P, chart review, counseling,  and generating customized AVS unique to this office visit / charting = 60 min

## 2019-05-20 NOTE — Patient Instructions (Addendum)
Stop lotensin and spiriva   Clonidine 0.1 mg  One tablet twice   Plan A = Automatic = Always=   Clonidine plus your other maintenance medications  Plan B = Backup (to supplement plan A, not to replace it) Only use your albuterol inhaler as a rescue medication to be used if you can't catch your breath by resting or doing a relaxed purse lip breathing pattern.  - The less you use it, the better it will work when you need it. - Ok to use the inhaler up to 2 puffs  every 4 hours if you must but call for appointment if use goes up over your usual need - Don't leave home without it !!  (think of it like the spare tire for your car)   Plan C = Crisis (instead of Plan B but only if Plan B stops working) - only use your albuterol nebulizer if you first try Plan B and it fails to help > ok to use the nebulizer up to every 4 hours but if start needing it regularly call for immediate appointment    Please schedule a follow up office visit in 4 weeks, call sooner if needed with all medications /inhalers/ solutions in hand so we can verify exactly what you are taking. This includes all medications from all doctors and over the counters - PLEASE separate them into two bags:  the ones you take automatically, no matter what, vs the ones you take just when you feel you need them "BAG #2 is UP TO YOU"  - this will really help [place] help you take your medications more effectively.   You will PFTs and CXR on return

## 2019-05-22 ENCOUNTER — Ambulatory Visit (INDEPENDENT_AMBULATORY_CARE_PROVIDER_SITE_OTHER): Payer: Medicaid Other | Admitting: "Endocrinology

## 2019-05-22 ENCOUNTER — Other Ambulatory Visit: Payer: Self-pay

## 2019-05-22 ENCOUNTER — Encounter: Payer: Self-pay | Admitting: "Endocrinology

## 2019-05-22 VITALS — BP 152/79 | HR 61 | Ht 71.0 in | Wt >= 6400 oz

## 2019-05-22 DIAGNOSIS — I10 Essential (primary) hypertension: Secondary | ICD-10-CM

## 2019-05-22 DIAGNOSIS — IMO0002 Reserved for concepts with insufficient information to code with codable children: Secondary | ICD-10-CM

## 2019-05-22 DIAGNOSIS — E1151 Type 2 diabetes mellitus with diabetic peripheral angiopathy without gangrene: Secondary | ICD-10-CM

## 2019-05-22 DIAGNOSIS — E782 Mixed hyperlipidemia: Secondary | ICD-10-CM

## 2019-05-22 DIAGNOSIS — Z794 Long term (current) use of insulin: Secondary | ICD-10-CM

## 2019-05-22 DIAGNOSIS — E1165 Type 2 diabetes mellitus with hyperglycemia: Secondary | ICD-10-CM

## 2019-05-22 MED ORDER — HUMULIN R U-500 KWIKPEN 500 UNIT/ML ~~LOC~~ SOPN: PEN_INJECTOR | SUBCUTANEOUS | 2 refills | Status: DC

## 2019-05-22 NOTE — Progress Notes (Signed)
05/22/2019   Endocrinology follow-up note  Subjective:    Patient ID: Aaron Potter, male    DOB: 22-Jul-1960, PCP [place], Rex Kras, MD   Past Medical History:  Diagnosis Date  . Allergic rhinitis, unspecified   . Allergy   . Anxiety disorder, unspecified   . Asthma   . Back pain   . Balanitis   . Cellulitis   . COPD (chronic obstructive pulmonary disease) ([place])   . Diabetes mellitus without complication ([place])   . Essential (primary) hypertension   . Gastro-esophageal reflux disease with esophagitis   . Gout   . Gout, unspecified   . Hypertension   . Lichen sclerosus   . Mixed hyperlipidemia   . Morbid (severe) obesity with alveolar hypoventilation ([place])   . Obesity, morbid (more than 100 lbs over ideal weight or BMI > 40) ([facility])   . Obesity, unspecified   . Polyosteoarthritis, unspecified   . Sleep apnea   . Sleep apnea, unspecified    CPAP  . Type 2 diabetes mellitus with hyperglycemia ([place])   . Urticaria   . 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 CHOLECYSTECTOMY    . ERCP  [place], CBD stone s/p cholecystectomy  . FOOT SURGERY Right 04/2017   Social History   Socioeconomic History  . Marital status: Single    Spouse name: Not on file  . Number of children: Not on file  . Years of education: Not on file  . Highest education level: Not on file  Occupational History  . Not on file  Tobacco Use  . Smoking status: Never Smoker  . Smokeless tobacco: Never Used  Substance and Sexual Activity  . Alcohol use: No    Alcohol/week: 0.0 standard drinks  . Drug use: No  . Sexual activity: Yes  Other Topics Concern  . Not on file  Social History Narrative  . Not on file   Social Determinants of Health   Financial Resource Strain:   . Difficulty of Paying Living Expenses:   Food Insecurity:   . Worried About Charity fundraiser in the Last Year:   . Arboriculturist in  the Last Year:   Transportation Needs:   . Film/video editor (Medical):   Marland Kitchen Lack of Transportation (Non-Medical):   Physical Activity:   . Days of Exercise per Week:   . Minutes of Exercise per Session:   Stress:   . Feeling of Stress :   Social Connections:   . Frequency of Communication with Friends and Family:   . Frequency of Social Gatherings with Friends and Family:   . Attends Religious Services:   . Active Member of Clubs or Organizations:   . Attends Archivist Meetings:   Marland Kitchen Marital Status:    Outpatient Encounter Medications as of 05/22/2019  Medication Sig  . ACCU-CHEK AVIVA PLUS test strip USE TO CHECK BLOOD SUGAR 4 TIMES DAILY.  Marland Kitchen Accu-Chek FastClix Lancets MISC USE TO CHECK BLOOD SUGAR ONCE A DAY.  Marland Kitchen Accu-Chek FastClix Lancets MISC 1 each by Does not apply route 4 (four) times daily.  Marland Kitchen albuterol (PROAIR HFA) 108 (90 Base) MCG/ACT inhaler Inhale 2 puffs into the lungs every 4 (four) hours as needed for wheezing or shortness of breath. Sometimes only uses one puff  . albuterol (PROVENTIL) (2.5 MG/3ML) 0.083% nebulizer solution Take 3 mLs (2.5 mg total) by  nebulization every 6 (six) hours as needed for wheezing or shortness of breath.  . allopurinol (ZYLOPRIM) 300 MG tablet Take 300 mg by mouth daily.    Marland Kitchen ALPRAZolam (XANAX) 0.5 MG tablet Take 1 tablet by mouth 2 (two) times daily as needed.  . Blood Glucose Monitoring Suppl (ACCU-CHEK GUIDE) w/Device KIT 1 Piece by Does not apply route as directed.  Marland Kitchen BYDUREON BCISE 2 MG/0.85ML AUIJ INJECT INTO THE SKIN ONCE A WEEK.  . cloNIDine (CATAPRES) 0.1 MG tablet Take 1 tablet (0.1 mg total) by mouth 2 (two) times daily.  . diclofenac Sodium (VOLTAREN) 1 % GEL Apply topically 4 (four) times daily.  . diphenhydrAMINE (BENADRYL) 25 mg capsule Take 50 mg by mouth every 6 (six) hours as needed for itching.  . furosemide (LASIX) 40 MG tablet Take 1 tablet (40 mg total) by mouth daily.  Marland Kitchen glipiZIDE (GLUCOTROL XL) 5 MG 24 hr  tablet TAKE 1 TABLET BY MOUTH DAILY WITH BREAKFAST.  Marland Kitchen GLOBAL EASE INJECT PEN NEEDLES 31G X 8 MM MISC USE 4 TIMES DAILY.  Marland Kitchen glucose blood (ACCU-CHEK GUIDE) test strip Use as instructed  . indomethacin (INDOCIN) 50 MG capsule Take 50 mg by mouth daily.   . insulin regular human CONCENTRATED (HUMULIN R U-500 KWIKPEN) 500 UNIT/ML kwikpen Inject 120 units with breakfast and supper, 100 units with supper if glucose is above 90  . metFORMIN (GLUCOPHAGE) 500 MG tablet Take 1,000 mg by mouth 2 (two) times daily with a meal.   . Multiple Vitamin (MULTIVITAMIN WITH MINERALS) TABS tablet Take 1 tablet by mouth daily.  Marland Kitchen oxyCODONE-acetaminophen (PERCOCET) 7.5-325 MG tablet Take 1 tablet by mouth every 4 (four) hours as needed for severe pain.  . pantoprazole (PROTONIX) 40 MG tablet Take 1 tablet (40 mg total) by mouth daily. 30 minutes before breakfast  . potassium chloride (KLOR-CON) 10 MEQ tablet Take 20 mEq by mouth daily.  . pravastatin (PRAVACHOL) 40 MG tablet Take 40 mg by mouth daily.  . verapamil (CALAN-SR) 240 MG CR tablet Take 240 mg by mouth daily.  . Vitamin D, Ergocalciferol, (DRISDOL) 1.25 MG (50000 UNIT) CAPS capsule Take 1 capsule (50,000 Units total) by mouth once a week.  . [DISCONTINUED] ALPRAZolam (XANAX) 1 MG tablet Take 1 mg by mouth 4 (four) times daily.    . [DISCONTINUED] HUMULIN R U-500 KWIKPEN 500 UNIT/ML kwikpen INJECT 120 UNITS WITH BREAKFAST, 110 UNITS WITH LUNCH AND 100 UNITS WITH SUPPER.   No facility-administered encounter medications on file as of 05/22/2019.   ALLERGIES: Allergies  Allergen Reactions  . Biaxin [Clarithromycin] Anaphylaxis and Shortness Of Breath   VACCINATION STATUS: Immunization History  Administered Date(s) Administered  . Influenza-Unspecified 10/18/2010, 10/18/2011, 10/18/2012, 12/11/2013, 09/11/2014, 10/13/2015, 09/06/2018  . Moderna SARS-COVID-2 Vaccination 04/01/2019, 04/29/2019  . Tdap 04/13/2016    Diabetes He presents for his follow-up  diabetic visit. He has type 2 diabetes mellitus. Onset time: He was diagnosed at approximate age of 55 years. His disease course has been stable. There are no hypoglycemic associated symptoms. Pertinent negatives for hypoglycemia include no confusion, headaches, pallor or seizures. Associated symptoms include polydipsia and polyuria. Pertinent negatives for diabetes include no chest pain, no fatigue, no polyphagia and no weakness. There are no hypoglycemic complications. Symptoms are stable. Diabetic complications include PVD. Risk factors for coronary artery disease include dyslipidemia, diabetes mellitus, family history, male sex, obesity and sedentary lifestyle. Current diabetic treatment includes intensive insulin program. He is compliant with treatment most of the time. His weight is  increasing steadily. He is following a generally unhealthy diet. When asked about meal planning, he reported none. He has had a previous visit with a dietitian. He never participates in exercise. His home blood glucose trend is decreasing steadily. His breakfast blood glucose range is generally 130-140 mg/dl. His lunch blood glucose range is generally 140-180 mg/dl. His dinner blood glucose range is generally 140-180 mg/dl. His bedtime blood glucose range is generally 140-180 mg/dl. His overall blood glucose range is 140-180 mg/dl. (He returns with significant improvement in his glycemic profile and previsit labs show A1c of 7.8%.  He did not document or report hypoglycemia.) An ACE inhibitor/angiotensin II receptor blocker is being taken.  Hyperlipidemia This is a chronic problem. The current episode started more than 1 year ago. The problem is uncontrolled. Exacerbating diseases include diabetes and obesity. Associated symptoms include shortness of breath. Pertinent negatives include no chest pain or myalgias. Current antihyperlipidemic treatment includes statins. Risk factors for coronary artery disease include diabetes  mellitus, dyslipidemia, hypertension, male sex, obesity and a sedentary lifestyle.  Hypertension This is a chronic problem. The current episode started more than 1 year ago. The problem is uncontrolled. Associated symptoms include shortness of breath. Pertinent negatives include no chest pain, headaches, neck pain or palpitations. Past treatments include ACE inhibitors and calcium channel blockers. The current treatment provides no improvement. Compliance problems include diet.  Hypertensive end-organ damage includes PVD.     Review of systems  Constitutional: + Minimally fluctuating body weight,  current  Body mass index is 64.05 kg/m. , no fatigue, no subjective hyperthermia, no subjective hypothermia Eyes: no blurry vision, no xerophthalmia ENT: no sore throat, no nodules palpated in throat, no dysphagia/odynophagia, no hoarseness Cardiovascular: no Chest Pain, no Shortness of Breath, no palpitations, no leg swelling Respiratory: no cough, no shortness of breath Gastrointestinal: no Nausea/Vomiting/Diarhhea Musculoskeletal: no muscle/joint aches Skin: no rashes, no hyperemia Neurological: no tremors, no numbness, no tingling, no dizziness Psychiatric: no depression, no anxiety   Objective:    BP (!) 152/79   Pulse 61   Ht 5' 11"  (1.803 m)   Wt (!) 459 lb 3.2 oz (208.3 kg)   BMI 64.05 kg/m   Wt Readings from Last 3 Encounters:  05/22/19 (!) 459 lb 3.2 oz (208.3 kg)  05/20/19 (!) 436 lb (197.8 kg)  04/19/19 (!) 451 lb 9.6 oz (204.8 kg)     Physical Exam- Limited  Constitutional:  Body mass index is 64.05 kg/m. , not in acute distress, normal state of mind Eyes:  EOMI, no exophthalmos Neck: Supple Thyroid: No gross goiter Respiratory: Adequate breathing efforts Musculoskeletal: no gross deformities, strength intact in all four extremities, no gross restriction of joint movements Skin:  no rashes, no hyperemia Neurological: no tremor with outstretched hands,    Results  for orders placed or performed in visit on 05/22/19  Hemoglobin A1c  Result Value Ref Range   Hemoglobin A1C 7.8    Diabetic Labs (most recent): Lab Results  Component Value Date   HGBA1C 7.8 05/01/2019   HGBA1C 7.7 (A) 01/22/2019   HGBA1C 8.5 (H) 10/10/2018   Lipid Panel     Component Value Date/Time   CHOL 137 04/01/2019 0843   TRIG 121 04/01/2019 0843   HDL 36 (L) 04/01/2019 0843   CHOLHDL 3.8 04/01/2019 0843   VLDL 31 (H) 03/12/2015 1200   LDLCALC 79 04/01/2019 0843     Assessment & Plan:   1. Uncontrolled type 2 diabetes mellitus with diabetic peripheral angiopathy  without gangrene, with long-term current use of insulin ([place]) - He has responded favorably to insulin U500. He returns with significant improvement in his glycemic profile and previsit labs show A1c of 7.8% improving from 9.6%.  He did not document or report hypoglycemia.  He remains at extremely high risk for more acute and chronic complications of diabetes which include CAD, CVA, CKD, retinopathy, and neuropathy. These are all discussed in detail with the patient.   Glucose logs and insulin administration records pertaining to this visit,  to be scanned into patient's records.  Recent labs reviewed.  - I have re-counseled the patient on diet management and weight loss  by adopting a carbohydrate restricted / protein rich  Diet.  -He still admits to dietary indiscretion including consumption of and sweetened beverages.  - he  admits there is a room for improvement in his diet and drink choices. -  Suggestion is made for him to avoid simple carbohydrates  from his diet including Cakes, Sweet Desserts / Pastries, Ice Cream, Soda (diet and regular), Sweet Tea, Candies, Chips, Cookies, Sweet Pastries,  Store Bought Juices, Alcohol in Excess of  1-2 drinks a day, Artificial Sweeteners, Coffee Creamer, and "Sugar-free" Products. This will help patient to have stable blood glucose profile and potentially avoid  unintended weight gain.   - Patient is advised to stick to a routine mealtimes to eat 3 meals  a day and avoid unnecessary snacks ( to snack only to correct hypoglycemia).   -He is advised to increase his Humulin U 5002 120 units with breakfast, 120 units with lunch, 100 units with supper  for pre-meal blood glucose readings of 90 or above milligrams per deciliter associated with strict monitoring of blood glucose 4 times a day-before meals and at bedtime.   -Patient is encouraged to call clinic for blood glucose levels less than 70 or above 300 mg /dl. -He is advised to continue metformin 1 g p.o. twice daily with breakfast and supper.  -He is also advised to continue Bydureon 2 mg subcutaneously weekly.  -He would benefit from a low-dose glipizide. -He is benefiting from a low-dose glipizide, advised to continue glipizide 5 mg XL p.o. daily at breakfast.  - He is a perfect candidate for bariatric surgery, he has been hesitant to consider this option. -He is following with Jearld Fenton, CDE for DM education.  - Patient specific target  for A1c; LDL, HDL, Triglycerides, and  Waist Circumference were discussed in detail.  2) BP/HTN: His blood pressure is not controlled to target.   He is advised to proceed with his benazepril to 10 mg p.o. daily, also has verapamil 240 mg p.o. daily for blood pressure treatment.   3) Lipids/HPL: Uncontrolled with his recent LDL level of 99.  He is advised to continue pravastatin 40 mg p.o. nightly.    Side effects and precautions discussed with him.    4)  Weight/Diet: His BMI is 74-JOINOMV complicating his diabetes care.  Is a candidate for modest weight loss.  He has had no significant success in weight control.  He still admits to dietary indiscretion.   CDE consult in progress, exercise, and carbohydrates information provided.  He hesitates to consider bariatric surgery.  5) Chronic Care/Health Maintenance:  -Patient  is  on ACEI/ARB and Statin  medications and encouraged to continue to follow up with Ophthalmology, Podiatrist at least yearly or according to recommendations, and advised to  stay away from smoking. I have recommended yearly flu vaccine and  pneumonia vaccination at least every 5 years; and  sleep for at least 7 hours a day. -This patient cannot exercise optimally due to his heavy weight and comorbidities.  - I advised patient to maintain close follow up with Maryruth Hancock, MD for primary care needs.   - Time spent on this patient care encounter:  35 min, of which > 50% was spent in  counseling and the rest reviewing his blood glucose logs , discussing his hypoglycemia and hyperglycemia episodes, reviewing his current and  previous labs / studies  ( including abstraction from other facilities) and medications  doses and developing a  long term treatment plan and documenting his care.   Please refer to Patient Instructions for Blood Glucose Monitoring and Insulin/Medications Dosing Guide"  in media tab for additional information. Please  also refer to " Patient Self Inventory" in the Media  tab for reviewed elements of pertinent patient history.  [name] participated in the discussions, expressed understanding, and voiced agreement with the above plans.  All questions were answered to his satisfaction. he is encouraged to contact clinic should he have any questions or concerns prior to his return visit.   Follow up plan: -Return in about 3 months (around 08/22/2019) for Bring Meter and Logs- A1c in Office.  Glade Lloyd, MD Phone: [phone]  Fax: [phone]  -  This note was partially dictated with voice recognition software. Similar sounding words can be transcribed inadequately or may not  be corrected upon review.  05/22/2019, 12:54 PM

## 2019-05-22 NOTE — Patient Instructions (Signed)

## 2019-05-28 ENCOUNTER — Other Ambulatory Visit: Payer: Self-pay | Admitting: Family Medicine

## 2019-05-28 ENCOUNTER — Other Ambulatory Visit: Payer: Self-pay | Admitting: "Endocrinology

## 2019-05-31 ENCOUNTER — Other Ambulatory Visit: Payer: Self-pay | Admitting: Family Medicine

## 2019-06-04 ENCOUNTER — Other Ambulatory Visit: Payer: Self-pay | Admitting: Family Medicine

## 2019-06-17 ENCOUNTER — Ambulatory Visit: Payer: Medicaid Other | Admitting: Internal Medicine

## 2019-06-21 ENCOUNTER — Other Ambulatory Visit: Payer: Self-pay

## 2019-06-21 ENCOUNTER — Other Ambulatory Visit (HOSPITAL_COMMUNITY)
Admission: RE | Admit: 2019-06-21 | Discharge: 2019-06-21 | Disposition: A | Discharge status: Home / Self Care | Payer: Medicaid Other | Source: Ambulatory Visit | Attending: Internal Medicine | Admitting: Internal Medicine

## 2019-06-21 DIAGNOSIS — Z01812 Encounter for preprocedural laboratory examination: Secondary | ICD-10-CM | POA: Insufficient documentation

## 2019-06-21 DIAGNOSIS — Z20822 Contact with and (suspected) exposure to covid-19: Secondary | ICD-10-CM | POA: Insufficient documentation

## 2019-06-21 LAB — SARS CORONAVIRUS 2 (TAT 6-24 HRS): SARS Coronavirus 2: NEGATIVE

## 2019-06-25 ENCOUNTER — Ambulatory Visit (HOSPITAL_COMMUNITY)
Admission: RE | Admit: 2019-06-25 | Discharge: 2019-06-25 | Disposition: A | Discharge status: Home / Self Care | Payer: Medicaid Other | Source: Ambulatory Visit | Attending: Internal Medicine | Admitting: Internal Medicine

## 2019-06-25 ENCOUNTER — Other Ambulatory Visit: Payer: Self-pay

## 2019-06-25 DIAGNOSIS — R0600 Dyspnea, unspecified: Secondary | ICD-10-CM | POA: Insufficient documentation

## 2019-06-25 DIAGNOSIS — R0609 Other forms of dyspnea: Secondary | ICD-10-CM

## 2019-06-25 LAB — PULMONARY FUNCTION TEST
DL/VA % pred: 129 %
DL/VA: 5.57 ml/min/mmHg/L
DLCO unc % pred: 84 %
DLCO unc: 24.79 ml/min/mmHg
FEF 25-75 Post: 1.86 L/sec
FEF 25-75 Pre: 2.64 L/sec
FEF2575-%Change-Post: -29 %
FEF2575-%Pred-Post: 56 %
FEF2575-%Pred-Pre: 80 %
FEV1-%Change-Post: -10 %
FEV1-%Pred-Post: 57 %
FEV1-%Pred-Pre: 64 %
FEV1-Post: 2.22 L
FEV1-Pre: 2.49 L
FEV1FVC-%Change-Post: -5 %
FEV1FVC-%Pred-Pre: 106 %
FEV6-%Change-Post: -5 %
FEV6-%Pred-Post: 59 %
FEV6-%Pred-Pre: 62 %
FEV6-Post: 2.87 L
FEV6-Pre: 3.06 L
FEV6FVC-%Change-Post: 0 %
FEV6FVC-%Pred-Post: 104 %
FEV6FVC-%Pred-Pre: 104 %
FVC-%Change-Post: -5 %
FVC-%Pred-Post: 56 %
FVC-%Pred-Pre: 60 %
FVC-Post: 2.88 L
FVC-Pre: 3.06 L
Post FEV1/FVC ratio: 77 %
Post FEV6/FVC ratio: 100 %
Pre FEV1/FVC ratio: 81 %
Pre FEV6/FVC Ratio: 100 %
RV % pred: 103 %
RV: 2.29 L
TLC % pred: 73 %
TLC: 5.25 L

## 2019-06-25 MED ORDER — ALBUTEROL SULFATE (2.5 MG/3ML) 0.083% IN NEBU
2.5000 mg | INHALATION_SOLUTION | Freq: Once | RESPIRATORY_TRACT | Status: AC
  Administered 2019-06-25: 2.5 mg via RESPIRATORY_TRACT

## 2019-06-30 LAB — EXTERNAL GENERIC LAB PROCEDURE: COLOGUARD: POSITIVE — AB

## 2019-06-30 LAB — COLOGUARD: COLOGUARD: POSITIVE — AB

## 2019-07-01 ENCOUNTER — Other Ambulatory Visit: Payer: Self-pay

## 2019-07-01 ENCOUNTER — Encounter: Payer: Self-pay | Admitting: Internal Medicine

## 2019-07-01 ENCOUNTER — Ambulatory Visit (INDEPENDENT_AMBULATORY_CARE_PROVIDER_SITE_OTHER): Payer: Medicaid Other | Admitting: Internal Medicine

## 2019-07-01 DIAGNOSIS — R0600 Dyspnea, unspecified: Secondary | ICD-10-CM

## 2019-07-01 DIAGNOSIS — R0609 Other forms of dyspnea: Secondary | ICD-10-CM

## 2019-07-01 DIAGNOSIS — I10 Essential (primary) hypertension: Secondary | ICD-10-CM | POA: Diagnosis not present

## 2019-07-01 NOTE — Assessment & Plan Note (Signed)
D/c ACEi 05/20/2019 > changed to clonidine for pain control component   Although even in retrospect it may not be clear the ACEi contributed to the pt's symptoms,   adding them back at this point or in the future would risk confusion in interpretation of non-specific respiratory symptoms to which this patient is prone  ie  Better not to muddy the waters here.   Rec:  Titrate up clonidine as needed for bp also will help with chronic pain > defer to PCP

## 2019-07-01 NOTE — Assessment & Plan Note (Signed)
Onset "asthma" as child/ never smoker - Spirometry 07/18/12   FEV1 1.90 (48%)  Ratio 0.69 with atypical curve in effort dep portion not typical of true airflow obst - trial off spiriva/ acei 05/20/2019 >>>   - PFT's  06/25/2019  FEV1 2.49 (64 % ) ratio 0.81  p 0 % improvement from saba p nothing  prior to study with DLCO  24.79 (84%) corrects to 5.57 (129%)  for alv volume and FV curve truncated exp effort dep portion and some insp truncation as well  But no true plateaus  -  ERV 16%  Although he could still have asthma, it would not explain his level of doe or typically resolve on cpap   Mostly likely he has obesity/ restrictive changes masquerading as airways dz  I spent extra time with pt today reviewing appropriate use of albuterol for prn use on exertion with the following points: 1) saba is for relief of sob that does not improve by walking a slower pace or resting but rather if the pt does not improve after trying this first. 2) If the pt is convinced, as many are, that saba helps recover from activity faster then it's easy to tell if this is the case by re-challenging : ie stop, take the inhaler, then p 5 minutes try the exact same activity (intensity of workload) that just caused the symptoms and see if they are substantially diminished or not after saba 3) if there is an activity that reproducibly causes the symptoms, try the saba 15 min before the activity on alternate days   If in fact the saba really does help, then fine to continue to use it prn but advised may need to look closer at the maintenance regimen being used to achieve better control of airways disease with exertion.    >>> f/u with sleep medicine in this clinic.

## 2019-07-01 NOTE — Assessment & Plan Note (Signed)
PFTs c/w obesity / restriction 06/25/19 Body mass index is 62.9 kg/m.  -  trending down slightly encouragedd Lab Results  Component Value Date   TSH 2.76 04/01/2019     Contributing to gerd risk/ doe/reviewed the need and the process to achieve and maintain neg calorie balance > defer f/u primary care including intermittently monitoring thyroid status            Each maintenance medication was reviewed in detail including emphasizing most importantly the difference between maintenance and prns and under what circumstances the prns are to be triggered using an action plan format where appropriate.  Total time for H and P, chart review, counseling, teaching device use  and generating customized AVS unique to this office visit / charting = 30 mn

## 2019-07-01 NOTE — Progress Notes (Signed)
Aaron Potter, male    DOB: October 31, 1960    MRN: [id number]   Brief patient profile:  35 yowm never smoker with MO complicated by osa and asthma as child remembers being hospitalized and never able to play sports with issues with doe started to have to inhalers for asthma which helped starting in 20s and no better with spiriva but also on maint saba = neb at home and hfa if out referred to pulmonary clinic 05/20/2019 by Dr   Benny Lennert      History of Present Illness  05/20/2019  Pulmonary/ 1st office eval/Kobe Jansma on acei and spiriva dpi  Chief Complaint  Patient presents with  . Pulmonary Consult    Referred by Dr Benny Lennert.  Former patient of Dr Luan Pulling. He states since the weather has been getting warmer his breathing has been slightly worse. He has been wheezing some and has occ cough in the am's with white to clear sputum. He uses CPAP. He uses his proair inhaler 3-4 x per wk.    Dyspnea:  100 ft down and then back to landing s stopping legs/back and breathing stop in about the same time  Cough: in am's esp but  Min productive  Sleep: on cpap 3 pillows  SABA use: avg neb twice daily "but supposed to do it 4x" and confused thoroughly with maint vs prns 02 none  rec Stop lotensin and spiriva  Clonidine 0.1 mg  One tablet twice  Plan A = Automatic = Always=   Clonidine plus your other maintenance medications Plan B = Backup (to supplement plan A, not to replace it) Only use your albuterol inhaler as a rescue medication Plan C = Crisis (instead of Plan B but only if Plan B stops working) - only use your albuterol nebulizer if you first try Plan B and it fails to help Please schedule a follow up office visit in 4 weeks, call sooner if needed with all medications /inhalers/ solutions in hand so we can verify exactly what you are taking. This includes all medications from all doctors and over the [place] separate them into two bags:  the ones you take automatically, no matter what, vs the  ones you take just when you feel you need them "BAG #2 is UP TO YOU"  - this will really help [place] help you take your medications more effectively.       07/01/2019  f/u ov/Khrista Braun re: restrictive changes on pfts/ no obst  S/p vaccines for covid 19  / did not bring meds  Chief Complaint  Patient presents with  . Follow-up    Breathing is unchanged since the last visit. No new co's today.   Dyspnea: no change Cough: none Sleeping: most nights ok on cpap ok   SABA use: never prechallenges  02: none    No obvious day to day or daytime variability or assoc excess/ purulent sputum or mucus plugs or hemoptysis or cp or chest tightness, subjective wheeze or overt sinus or hb symptoms.   Sleep  without nocturnal  or early am exacerbation  of respiratory  c/o's or need for noct saba. Also denies any obvious fluctuation of symptoms with weather or environmental changes or other aggravating or alleviating factors except as outlined above   No unusual exposure hx or h/o childhood pna/  or knowledge of premature birth.  Current Allergies, Complete Past Medical History, Past Surgical History, Family History, and Social History were reviewed in [employer]  record.    ROS  The following are not active complaints unless bolded Hoarseness, sore throat, dysphagia, dental problems, itching, sneezing,  nasal congestion or discharge of excess mucus or purulent secretions, ear ache,   fever, chills, sweats, unintended wt loss or wt gain, classically pleuritic or exertional cp,  orthopnea pnd or arm/hand swelling  or leg swelling, presyncope, palpitations, abdominal pain, anorexia, nausea, vomiting, diarrhea  or change in bowel habits or change in bladder habits, change in stools or change in urine, dysuria, hematuria,  rash, arthralgias, visual complaints, headache, numbness, weakness or ataxia or problems with walking or coordination,  change in mood or  memory.        Current Meds   Medication Sig  . ACCU-CHEK AVIVA PLUS test strip USE TO CHECK BLOOD SUGAR 4 TIMES DAILY.  Marland Kitchen Accu-Chek FastClix Lancets MISC USE TO CHECK BLOOD SUGAR ONCE A DAY.  Marland Kitchen Accu-Chek FastClix Lancets MISC 1 each by Does not apply route 4 (four) times daily.  Marland Kitchen albuterol (PROAIR HFA) 108 (90 Base) MCG/ACT inhaler Inhale 2 puffs into the lungs every 4 (four) hours as needed for wheezing or shortness of breath. Sometimes only uses one puff  . albuterol (PROVENTIL) (2.5 MG/3ML) 0.083% nebulizer solution Take 3 mLs (2.5 mg total) by nebulization every 6 (six) hours as needed for wheezing or shortness of breath.  . allopurinol (ZYLOPRIM) 300 MG tablet Take 300 mg by mouth daily.    Marland Kitchen ALPRAZolam (XANAX) 0.5 MG tablet Take 1 tablet by mouth 2 (two) times daily as needed.  . Blood Glucose Monitoring Suppl (ACCU-CHEK GUIDE) w/Device KIT 1 Piece by Does not apply route as directed.  Marland Kitchen BYDUREON BCISE 2 MG/0.85ML AUIJ INJECT INTO THE SKIN ONCE A WEEK.  . cloNIDine (CATAPRES) 0.1 MG tablet Take 1 tablet (0.1 mg total) by mouth 2 (two) times daily.  . diclofenac Sodium (VOLTAREN) 1 % GEL Apply topically 4 (four) times daily.  . diphenhydrAMINE (BENADRYL) 25 mg capsule Take 50 mg by mouth every 6 (six) hours as needed for itching.  Marland Kitchen glipiZIDE (GLUCOTROL XL) 5 MG 24 hr tablet TAKE 1 TABLET BY MOUTH DAILY WITH BREAKFAST.  Marland Kitchen GLOBAL EASE INJECT PEN NEEDLES 31G X 8 MM MISC USE 4 TIMES DAILY.  Marland Kitchen glucose blood (ACCU-CHEK GUIDE) test strip Use as instructed  . indomethacin (INDOCIN) 50 MG capsule Take 50 mg by mouth daily.   . insulin regular human CONCENTRATED (HUMULIN R U-500 KWIKPEN) 500 UNIT/ML kwikpen Inject 120 units with breakfast and supper, 100 units with supper if glucose is above 90  . metFORMIN (GLUCOPHAGE) 500 MG tablet Take 1,000 mg by mouth 2 (two) times daily with a meal.   . Multiple Vitamin (MULTIVITAMIN WITH MINERALS) TABS tablet Take 1 tablet by mouth daily.  Marland Kitchen oxyCODONE-acetaminophen (PERCOCET) 7.5-325  MG tablet Take 1 tablet by mouth every 4 (four) hours as needed for severe pain.  . pantoprazole (PROTONIX) 40 MG tablet Take 1 tablet (40 mg total) by mouth daily. 30 minutes before breakfast  . potassium chloride (KLOR-CON) 10 MEQ tablet Take 20 mEq by mouth daily.  . pravastatin (PRAVACHOL) 40 MG tablet Take 40 mg by mouth daily.  . verapamil (CALAN-SR) 240 MG CR tablet Take 240 mg by mouth daily.  . Vitamin D, Ergocalciferol, (DRISDOL) 1.25 MG (50000 UNIT) CAPS capsule Take 1 capsule (50,000 Units total) by mouth once a week.  Past Medical History:  Diagnosis Date  . Allergic rhinitis, unspecified   . Allergy   . Anxiety disorder, unspecified   . Asthma   . Back pain   . Balanitis   . Cellulitis   . COPD (chronic obstructive pulmonary disease) ([place])   . Diabetes mellitus without complication ([place])   . Essential (primary) hypertension   . Gastro-esophageal reflux disease with esophagitis   . Gout   . Gout, unspecified   . Hypertension   . Lichen sclerosus   . Mixed hyperlipidemia   . Morbid (severe) obesity with alveolar hypoventilation ([place])   . Obesity, morbid (more than 100 lbs over ideal weight or BMI > 40) ([facility])   . Obesity, unspecified   . Polyosteoarthritis, unspecified   . Sleep apnea   . Sleep apnea, unspecified    CPAP  . Type 2 diabetes mellitus with hyperglycemia ([place])   . Urticaria   . 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    NECK :  without JVD/Nodes/TM/ nl carotid upstrokes bilaterally   LUNGS: no acc muscle use,  Nl contour chest which is clear to A and  P bilaterally without cough on insp or exp maneuvers   CV:  RRR  no s3 or murmur or increase in P2, and n !+ pitting both LE  edema (no lasix today)   ABD:  Obese soft and nontender with nl inspiratory excursion in the supine position. No bruits or organomegaly appreciated, bowel sounds nl  MS:  Nl gait/ ext warm without deformities, calf tenderness, cyanosis or clubbing No obvious joint restrictions   SKIN: warm and dry without lesions    NEURO:  alert, approp, nl sensorium with  no motor or cerebellar deficits apparent.            Assessment

## 2019-07-01 NOTE — Patient Instructions (Addendum)
Ok to let Dr Wyline Mood to treat your blood pressure but I would avoid ACE inhibitors at all costs because confuse the interpretation of your symptoms which are largely related to your weight   Try albuterol 15 min (either the inhaler or nebulizer but not both at same time)  before an activity that you know would make you short of breath and see if it makes any difference and if makes none then don't take it after activity unless you can't catch your breath    Please schedule a follow up office visit in 6-7  weeks, call sooner if needed - to see one of our sleep doctors and follow up with me is as needed Add:  Would do cxr on return as none in computer

## 2019-07-02 ENCOUNTER — Encounter: Payer: Self-pay | Admitting: Internal Medicine

## 2019-07-15 ENCOUNTER — Other Ambulatory Visit: Payer: Self-pay | Admitting: "Endocrinology

## 2019-07-31 ENCOUNTER — Other Ambulatory Visit: Payer: Self-pay | Admitting: "Endocrinology

## 2019-08-01 ENCOUNTER — Ambulatory Visit: Payer: Medicaid Other | Admitting: Gastroenterology

## 2019-08-04 ENCOUNTER — Other Ambulatory Visit: Payer: Self-pay | Admitting: "Endocrinology

## 2019-08-05 ENCOUNTER — Other Ambulatory Visit: Payer: Self-pay | Admitting: "Endocrinology

## 2019-08-22 ENCOUNTER — Institutional Professional Consult (permissible substitution): Payer: Medicaid Other | Admitting: Pulmonary Disease

## 2019-08-23 ENCOUNTER — Encounter: Payer: Self-pay | Admitting: "Endocrinology

## 2019-08-23 ENCOUNTER — Telehealth (INDEPENDENT_AMBULATORY_CARE_PROVIDER_SITE_OTHER): Payer: Medicaid Other | Admitting: "Endocrinology

## 2019-08-23 VITALS — Ht 71.0 in | Wt >= 6400 oz

## 2019-08-23 DIAGNOSIS — E1151 Type 2 diabetes mellitus with diabetic peripheral angiopathy without gangrene: Secondary | ICD-10-CM | POA: Diagnosis not present

## 2019-08-23 DIAGNOSIS — IMO0002 Reserved for concepts with insufficient information to code with codable children: Secondary | ICD-10-CM

## 2019-08-23 DIAGNOSIS — I10 Essential (primary) hypertension: Secondary | ICD-10-CM | POA: Diagnosis not present

## 2019-08-23 DIAGNOSIS — E782 Mixed hyperlipidemia: Secondary | ICD-10-CM | POA: Diagnosis not present

## 2019-08-23 DIAGNOSIS — E1165 Type 2 diabetes mellitus with hyperglycemia: Secondary | ICD-10-CM

## 2019-08-23 DIAGNOSIS — Z794 Long term (current) use of insulin: Secondary | ICD-10-CM

## 2019-08-23 NOTE — Progress Notes (Signed)
08/23/2019   Endocrinology follow-up note  Subjective:    Patient ID: Aaron Potter, male    DOB: 04-Jul-1960, PCP Leeanne Rio, MD   Past Medical History:  Diagnosis Date  . Allergic rhinitis, unspecified   . Allergy   . Anxiety disorder, unspecified   . Asthma   . Back pain   . Balanitis   . Cellulitis   . COPD (chronic obstructive pulmonary disease) ([place])   . Diabetes mellitus without complication ([place])   . Essential (primary) hypertension   . Gastro-esophageal reflux disease with esophagitis   . Gout   . Gout, unspecified   . Hypertension   . Lichen sclerosus   . Mixed hyperlipidemia   . Morbid (severe) obesity with alveolar hypoventilation ([place])   . Obesity, morbid (more than 100 lbs over ideal weight or BMI > 40) ([facility])   . Obesity, unspecified   . Polyosteoarthritis, unspecified   . Sleep apnea   . Sleep apnea, unspecified    CPAP  . Type 2 diabetes mellitus with hyperglycemia ([place])   . Urticaria   . 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 CHOLECYSTECTOMY    . ERCP  [place], CBD stone s/p cholecystectomy  . FOOT SURGERY Right 04/2017   Social History   Socioeconomic History  . Marital status: Single    Spouse name: Not on file  . Number of children: Not on file  . Years of education: Not on file  . Highest education level: Not on file  Occupational History  . Not on file  Tobacco Use  . Smoking status: Never Smoker  . Smokeless tobacco: Never Used  Vaping Use  . Vaping Use: Never used  Substance and Sexual Activity  . Alcohol use: No    Alcohol/week: 0.0 standard drinks  . Drug use: No  . Sexual activity: Yes  Other Topics Concern  . Not on file  Social History Narrative  . Not on file   Social Determinants of Health   Financial Resource Strain:   . Difficulty of Paying Living Expenses: Not on file  Food Insecurity:   . Worried About Paediatric nurse in the Last Year: Not on file  . Ran Out of Food in the Last Year: Not on file  Transportation Needs:   . Lack of Transportation (Medical): Not on file  . Lack of Transportation (Non-Medical): Not on file  Physical Activity:   . Days of Exercise per Week: Not on file  . Minutes of Exercise per Session: Not on file  Stress:   . Feeling of Stress : Not on file  Social Connections:   . Frequency of Communication with Friends and Family: Not on file  . Frequency of Social Gatherings with Friends and Family: Not on file  . Attends Religious Services: Not on file  . Active Member of Clubs or Organizations: Not on file  . Attends Archivist Meetings: Not on file  . Marital Status: Not on file   Outpatient Encounter Medications as of 08/23/2019  Medication Sig  . ACCU-CHEK AVIVA PLUS test strip USE TO CHECK BLOOD SUGAR 4 TIMES DAILY.  Marland Kitchen ACCU-CHEK AVIVA PLUS test strip USE TO CHECK BLOOD SUGAR FOUR TIMES A DAY.  Marland Kitchen Accu-Chek FastClix Lancets MISC USE TO CHECK BLOOD SUGAR ONCE A DAY.  Marland Kitchen Accu-Chek FastClix Lancets MISC 1 each by Does not apply route  4 (four) times daily.  Marland Kitchen albuterol (PROAIR HFA) 108 (90 Base) MCG/ACT inhaler Inhale 2 puffs into the lungs every 4 (four) hours as needed for wheezing or shortness of breath. Sometimes only uses one puff  . albuterol (PROVENTIL) (2.5 MG/3ML) 0.083% nebulizer solution Take 3 mLs (2.5 mg total) by nebulization every 6 (six) hours as needed for wheezing or shortness of breath.  . allopurinol (ZYLOPRIM) 300 MG tablet Take 300 mg by mouth daily.    Marland Kitchen ALPRAZolam (XANAX) 0.5 MG tablet Take 1 tablet by mouth 2 (two) times daily as needed.  . Blood Glucose Monitoring Suppl (ACCU-CHEK GUIDE) w/Device KIT 1 Piece by Does not apply route as directed.  Marland Kitchen BYDUREON BCISE 2 MG/0.85ML AUIJ INJECT INTO THE SKIN ONCE A WEEK.  . cloNIDine (CATAPRES) 0.1 MG tablet Take 1 tablet (0.1 mg total) by mouth 2 (two) times daily.  . diclofenac Sodium  (VOLTAREN) 1 % GEL Apply topically 4 (four) times daily.  . diphenhydrAMINE (BENADRYL) 25 mg capsule Take 50 mg by mouth every 6 (six) hours as needed for itching.  . furosemide (LASIX) 40 MG tablet Take 1 tablet (40 mg total) by mouth daily.  Marland Kitchen glipiZIDE (GLUCOTROL XL) 5 MG 24 hr tablet TAKE 1 TABLET ONCE DAILY WITH BREAKFAST.  Marland Kitchen GLOBAL EASE INJECT PEN NEEDLES 31G X 8 MM MISC USE 4 TIMES DAILY.  . indomethacin (INDOCIN) 50 MG capsule Take 50 mg by mouth daily.   . insulin regular human CONCENTRATED (HUMULIN R U-500 KWIKPEN) 500 UNIT/ML kwikpen Inject 120 units with breakfast and supper, 100 units with supper if glucose is above 90 (Patient taking differently: Inject 120 units with breakfast and lunch, 100 units with supper if glucose is above 90)  . metFORMIN (GLUCOPHAGE) 500 MG tablet TAKE 2 TABLETS BY MOUTH TWICE DAILY.  . Multiple Vitamin (MULTIVITAMIN WITH MINERALS) TABS tablet Take 1 tablet by mouth daily.  Marland Kitchen oxyCODONE-acetaminophen (PERCOCET) 7.5-325 MG tablet Take 1 tablet by mouth every 4 (four) hours as needed for severe pain.  . pantoprazole (PROTONIX) 40 MG tablet Take 1 tablet (40 mg total) by mouth daily. 30 minutes before breakfast  . potassium chloride (KLOR-CON) 10 MEQ tablet Take 20 mEq by mouth daily.  . pravastatin (PRAVACHOL) 40 MG tablet Take 40 mg by mouth daily.  . verapamil (CALAN-SR) 240 MG CR tablet Take 240 mg by mouth daily.  . Vitamin D, Ergocalciferol, (DRISDOL) 1.25 MG (50000 UNIT) CAPS capsule Take 1 capsule (50,000 Units total) by mouth once a week.   No facility-administered encounter medications on file as of 08/23/2019.   ALLERGIES: Allergies  Allergen Reactions  . Biaxin [Clarithromycin] Anaphylaxis and Shortness Of Breath   VACCINATION STATUS: Immunization History  Administered Date(s) Administered  . Influenza-Unspecified 10/18/2010, 10/18/2011, 10/18/2012, 12/11/2013, 09/11/2014, 10/13/2015, 09/06/2018  . Moderna SARS-COVID-2 Vaccination 04/01/2019,  04/29/2019  . Tdap 04/13/2016    Diabetes He presents for his follow-up diabetic visit. He has type 2 diabetes mellitus. Onset time: He was diagnosed at approximate age of 89 years. His disease course has been worsening. There are no hypoglycemic associated symptoms. Pertinent negatives for hypoglycemia include no confusion, headaches, pallor or seizures. Associated symptoms include polydipsia and polyuria. Pertinent negatives for diabetes include no chest pain, no fatigue, no polyphagia and no weakness. There are no hypoglycemic complications. Symptoms are worsening. Diabetic complications include PVD. Risk factors for coronary artery disease include dyslipidemia, diabetes mellitus, family history, male sex, obesity and sedentary lifestyle. Current diabetic treatment includes intensive insulin program. He  is compliant with treatment most of the time. His weight is increasing steadily. He is following a generally unhealthy diet. When asked about meal planning, he reported none. He has had a previous visit with a dietitian. He never participates in exercise. His home blood glucose trend is decreasing steadily. His breakfast blood glucose range is generally 140-180 mg/dl. His lunch blood glucose range is generally 140-180 mg/dl. His dinner blood glucose range is generally 180-200 mg/dl. His bedtime blood glucose range is generally 180-200 mg/dl. His overall blood glucose range is 180-200 mg/dl. (He reports increasing glycemic profile due to dietary indiscretions.  Admittedly he snacks continuously.  His recent A1c was 7.8% overall improving.  He does not report hypoglycemia.  ) An ACE inhibitor/angiotensin II receptor blocker is being taken.  Hyperlipidemia This is a chronic problem. The current episode started more than 1 year ago. The problem is uncontrolled. Exacerbating diseases include diabetes and obesity. Associated symptoms include shortness of breath. Pertinent negatives include no chest pain or  myalgias. Current antihyperlipidemic treatment includes statins. Risk factors for coronary artery disease include diabetes mellitus, dyslipidemia, hypertension, male sex, obesity and a sedentary lifestyle.  Hypertension This is a chronic problem. The current episode started more than 1 year ago. The problem is uncontrolled. Associated symptoms include shortness of breath. Pertinent negatives include no chest pain, headaches, neck pain or palpitations. Past treatments include ACE inhibitors and calcium channel blockers. The current treatment provides no improvement. Compliance problems include diet.  Hypertensive end-organ damage includes PVD.     Review of systems  Limited as above.   Objective:    Ht _0  (1.803 m)   Wt (!) 447 lb (202.8 kg)   BMI 62.34 kg/m   Wt Readings from Last 3 Encounters:  08/23/19 (!) 447 lb (202.8 kg)  07/01/19 (!) 451 lb (204.6 kg)  05/22/19 (!) 459 lb 3.2 oz (208.3 kg)     Physical Exam- Limited    Results for orders placed or performed during the hospital encounter of 06/25/19  Pulmonary Function Test  Result Value Ref Range   FVC-Pre 3.06 L   FVC-%Pred-Pre 60 %   FVC-Post 2.88 L   FVC-%Pred-Post 56 %   FVC-%Change-Post -5 %   FEV1-Pre 2.49 L   FEV1-%Pred-Pre 64 %   FEV1-Post 2.22 L   FEV1-%Pred-Post 57 %   FEV1-%Change-Post -10 %   FEV6-Pre 3.06 L   FEV6-%Pred-Pre 62 %   FEV6-Post 2.87 L   FEV6-%Pred-Post 59 %   FEV6-%Change-Post -5 %   Pre FEV1/FVC ratio 81 %   FEV1FVC-%Pred-Pre 106 %   Post FEV1/FVC ratio 77 %   FEV1FVC-%Change-Post -5 %   Pre FEV6/FVC Ratio 100 %   FEV6FVC-%Pred-Pre 104 %   Post FEV6/FVC ratio 100 %   FEV6FVC-%Pred-Post 104 %   FEV6FVC-%Change-Post 0 %   FEF 25-75 Pre 2.64 L/sec   FEF2575-%Pred-Pre 80 %   FEF 25-75 Post 1.86 L/sec   FEF2575-%Pred-Post 56 %   FEF2575-%Change-Post -29 %   RV 2.29 L   RV % pred 103 %   TLC 5.25 L   TLC % pred 73 %   DLCO unc 24.79 ml/min/mmHg   DLCO unc % pred 84 %    DL/VA 5.57 ml/min/mmHg/L   DL/VA % pred 129 %   Diabetic Labs (most recent): Lab Results  Component Value Date   HGBA1C 7.8 05/01/2019   HGBA1C 7.7 (A) 01/22/2019   HGBA1C 8.5 (H) 10/10/2018   Lipid Panel  Component Value Date/Time   CHOL 137 04/01/2019 0843   TRIG 121 04/01/2019 0843   HDL 36 (L) 04/01/2019 0843   CHOLHDL 3.8 04/01/2019 0843   VLDL 31 (H) 03/12/2015 1200   LDLCALC 79 04/01/2019 0843   CMP Latest Ref Rng & Units 04/01/2019 10/10/2018 06/11/2018  Glucose 65 - 99 mg/dL 215(H) 185(H) 230(H)  BUN 7 - 25 mg/dL _0 Creatinine 0.70 - 1.33 mg/dL 0.77 0.75 0.72  Sodium 135 - 146 mmol/L 141 138 138  Potassium 3.5 - 5.3 mmol/L 4.6 4.5 4.6  Chloride 98 - 110 mmol/L 104 103 100  CO2 20 - 32 mmol/L _1 Calcium 8.6 - 10.3 mg/dL 9.5 9.5 9.6  Total Protein 6.1 - 8.1 g/dL 6.6 6.2 6.4  Total Bilirubin 0.2 - 1.2 mg/dL 0.4 0.4 0.4  Alkaline Phos 40 - 115 U/L - - -  AST 10 - 35 U/L 32 23 26  ALT 9 - 46 U/L _2 Assessment & Plan:   1. Uncontrolled type 2 diabetes mellitus with diabetic peripheral angiopathy without gangrene, with long-term current use of insulin ([place]) He reports increasing glycemic profile due to dietary indiscretions.  Admittedly he snacks continuously.  His recent A1c was 7.8% overall improving.  He does not report hypoglycemia.   He remains at extremely high risk for more acute and chronic complications of diabetes which include CAD, CVA, CKD, retinopathy, and neuropathy. These are all discussed in detail with the patient.   Glucose logs and insulin administration records pertaining to this visit,  to be scanned into patient's records.  Recent labs reviewed.  - I have re-counseled the patient on diet management and weight loss  by adopting a carbohydrate restricted / protein rich  Diet.  -He still admits to dietary indiscretion including consumption of and sweetened beverages.  - he  admits there is a room for improvement in his  diet and drink choices. -  Suggestion is made for him to avoid simple carbohydrates  from his diet including Cakes, Sweet Desserts / Pastries, Ice Cream, Soda (diet and regular), Sweet Tea, Candies, Chips, Cookies, Sweet Pastries,  Store Bought Juices, Alcohol in Excess of  1-2 drinks a day, Artificial Sweeteners, Coffee Creamer, and "Sugar-free" Products. This will help patient to have stable blood glucose profile and potentially avoid unintended weight gain.   - Patient is advised to stick to a routine mealtimes to eat 3 meals  a day and avoid unnecessary snacks ( to snack only to correct hypoglycemia).   -He promises to stop snacking unnecessarily and wishes to stay on his current insulin regimen.  He is advised to continue Humulin U 500 120 units with breakfast, 120 units with lunch, 100 units with supper  for pre-meal blood glucose readings of 90 or above milligrams per deciliter associated with strict monitoring of blood glucose 4 times a day-before meals and at bedtime.   -Patient is encouraged to call clinic for blood glucose levels less than 70 or above 300 mg /dl. -He is advised to continue metformin 1 g p.o. twice daily with breakfast and supper.  -He is also advised to continue Bydureon 2 mg subcutaneously weekly.  -He would benefit from a low-dose glipizide. -He is benefiting from a low-dose glipizide, advised to continue glipizide 5 mg XL p.o. daily at breakfast.  - He is a perfect candidate for bariatric surgery, he has been hesitant to consider this option. -He is following with  Jearld Fenton, CDE for DM education.  - Patient specific target  for A1c; LDL, HDL, Triglycerides, and  Waist Circumference were discussed in detail.  2) BP/HTN: he is advised to home monitor blood pressure and report if > 140/90 on 2 separate readings.    He is advised to proceed with his benazepril to 10 mg p.o. daily, also has verapamil 240 mg p.o. daily for blood pressure treatment.   3)  Lipids/HPL: Uncontrolled with his recent LDL level of 99.  He is advised to continue pravastatin 40 mg p.o. nightly.      Side effects and precautions discussed with him.    4)  Weight/Diet: His BMI is 19-WYSORTQ complicating his diabetes care.  Is a candidate for modest weight loss.  He has had no significant success in weight control.  He still admits to dietary indiscretion.   CDE consult in progress, exercise, and carbohydrates information provided.  He hesitates to consider bariatric surgery.  5) Chronic Care/Health Maintenance:  -Patient  is  on ACEI/ARB and Statin medications and encouraged to continue to follow up with Ophthalmology, Podiatrist at least yearly or according to recommendations, and advised to  stay away from smoking. I have recommended yearly flu vaccine and pneumonia vaccination at least every 5 years; and  sleep for at least 7 hours a day. -This patient cannot exercise optimally due to his heavy weight and comorbidities.  - I advised patient to maintain close follow up with Leeanne Rio, MD for primary care needs.   - Time spent on this patient care encounter:  35 min, of which > 50% was spent in  counseling and the rest reviewing his blood glucose logs , discussing his hypoglycemia and hyperglycemia episodes, reviewing his current and  previous labs / studies  ( including abstraction from other facilities) and medications  doses and developing a  long term treatment plan and documenting his care.   Please refer to Patient Instructions for Blood Glucose Monitoring and Insulin/Medications Dosing Guide"  in media tab for additional information. Please  also refer to " Patient Self Inventory" in the Media  tab for reviewed elements of pertinent patient history.  Aaron Potter participated in the discussions, expressed understanding, and voiced agreement with the above plans.  All questions were answered to his satisfaction. he is encouraged to contact clinic should he have  any questions or concerns prior to his return visit.    Follow up plan: -Return in about 4 months (around 12/23/2019) for F/U with Pre-visit Labs, Meter, Logs, A1c here.Glade Lloyd, MD Phone: [phone]  Fax: [phone]  -  This note was partially dictated with voice recognition software. Similar sounding words can be transcribed inadequately or may not  be corrected upon review.  08/23/2019, 12:48 PM

## 2019-08-23 NOTE — Patient Instructions (Signed)

## 2019-09-04 ENCOUNTER — Other Ambulatory Visit: Payer: Self-pay | Admitting: Family Medicine

## 2019-09-12 ENCOUNTER — Other Ambulatory Visit: Payer: Self-pay | Admitting: "Endocrinology

## 2019-09-24 ENCOUNTER — Other Ambulatory Visit: Payer: Self-pay | Admitting: "Endocrinology

## 2019-09-24 DIAGNOSIS — IMO0002 Reserved for concepts with insufficient information to code with codable children: Secondary | ICD-10-CM

## 2019-10-07 LAB — HM DIABETES EYE EXAM

## 2019-10-29 ENCOUNTER — Other Ambulatory Visit: Payer: Self-pay | Admitting: "Endocrinology

## 2019-10-29 DIAGNOSIS — E1151 Type 2 diabetes mellitus with diabetic peripheral angiopathy without gangrene: Secondary | ICD-10-CM

## 2019-10-29 DIAGNOSIS — IMO0002 Reserved for concepts with insufficient information to code with codable children: Secondary | ICD-10-CM

## 2019-11-29 ENCOUNTER — Other Ambulatory Visit: Payer: Self-pay | Admitting: "Endocrinology

## 2019-12-20 LAB — COMPREHENSIVE METABOLIC PANEL
ALT: 33 IU/L (ref 0–44)
AST: 32 IU/L (ref 0–40)
Albumin/Globulin Ratio: 1.6 (ref 1.2–2.2)
Albumin: 3.9 g/dL (ref 3.8–4.9)
Alkaline Phosphatase: 82 IU/L (ref 44–121)
BUN/Creatinine Ratio: 15 (ref 9–20)
BUN: 12 mg/dL (ref 6–24)
Bilirubin Total: 0.4 mg/dL (ref 0.0–1.2)
CO2: 26 mmol/L (ref 20–29)
Calcium: 10.6 mg/dL — ABNORMAL HIGH (ref 8.7–10.2)
Chloride: 98 mmol/L (ref 96–106)
Creatinine, Ser: 0.78 mg/dL (ref 0.76–1.27)
GFR calc Af Amer: 116 mL/min/{1.73_m2} (ref >59)
GFR calc non Af Amer: 100 mL/min/{1.73_m2} (ref >59)
Globulin, Total: 2.4 g/dL (ref 1.5–4.5)
Glucose: 167 mg/dL — ABNORMAL HIGH (ref 65–99)
Potassium: 4.8 mmol/L (ref 3.5–5.2)
Sodium: 140 mmol/L (ref 134–144)
Total Protein: 6.3 g/dL (ref 6.0–8.5)

## 2019-12-25 ENCOUNTER — Encounter: Payer: Self-pay | Admitting: "Endocrinology

## 2019-12-25 ENCOUNTER — Other Ambulatory Visit: Payer: Self-pay

## 2019-12-25 ENCOUNTER — Ambulatory Visit (INDEPENDENT_AMBULATORY_CARE_PROVIDER_SITE_OTHER): Payer: Medicaid Other | Admitting: "Endocrinology

## 2019-12-25 VITALS — BP 173/71 | HR 76 | Ht 71.0 in | Wt >= 6400 oz

## 2019-12-25 DIAGNOSIS — IMO0002 Reserved for concepts with insufficient information to code with codable children: Secondary | ICD-10-CM

## 2019-12-25 DIAGNOSIS — E1151 Type 2 diabetes mellitus with diabetic peripheral angiopathy without gangrene: Secondary | ICD-10-CM | POA: Diagnosis not present

## 2019-12-25 DIAGNOSIS — I10 Essential (primary) hypertension: Secondary | ICD-10-CM

## 2019-12-25 DIAGNOSIS — Z794 Long term (current) use of insulin: Secondary | ICD-10-CM

## 2019-12-25 DIAGNOSIS — E782 Mixed hyperlipidemia: Secondary | ICD-10-CM | POA: Diagnosis not present

## 2019-12-25 DIAGNOSIS — E1165 Type 2 diabetes mellitus with hyperglycemia: Secondary | ICD-10-CM

## 2019-12-25 LAB — HEMOGLOBIN A1C: Hemoglobin A1C: 7

## 2019-12-25 NOTE — Progress Notes (Signed)
12/25/2019   Endocrinology follow-up note  Subjective:    Patient ID: Aaron Potter, male    DOB: [birth year], PCP Leeanne Rio, MD   Past Medical History:  Diagnosis Date  . Allergic rhinitis, unspecified   . Allergy   . Anxiety disorder, unspecified   . Asthma   . Back pain   . Balanitis   . Cellulitis   . COPD (chronic obstructive pulmonary disease) ([place])   . Diabetes mellitus without complication ([place])   . Essential (primary) hypertension   . Gastro-esophageal reflux disease with esophagitis   . Gout   . Gout, unspecified   . Hypertension   . Lichen sclerosus   . Mixed hyperlipidemia   . Morbid (severe) obesity with alveolar hypoventilation ([place])   . Obesity, morbid (more than 100 lbs over ideal weight or BMI > 40) ([facility])   . Obesity, unspecified   . Polyosteoarthritis, unspecified   . Sleep apnea   . Sleep apnea, unspecified    CPAP  . Type 2 diabetes mellitus with hyperglycemia ([place])   . Urticaria   . 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 CHOLECYSTECTOMY    . ERCP  [place], CBD stone s/p cholecystectomy  . FOOT SURGERY Right 04/2017   Social History   Socioeconomic History  . Marital status: Single    Spouse name: Not on file  . Number of children: Not on file  . Years of education: Not on file  . Highest education level: Not on file  Occupational History  . Not on file  Tobacco Use  . Smoking status: Never Smoker  . Smokeless tobacco: Never Used  Vaping Use  . Vaping Use: Never used  Substance and Sexual Activity  . Alcohol use: No    Alcohol/week: 0.0 standard drinks  . Drug use: No  . Sexual activity: Yes  Other Topics Concern  . Not on file  Social History Narrative  . Not on file   Social Determinants of Health   Financial Resource Strain: Not on file  Food Insecurity: Not on file  Transportation Needs: Not on file  Physical Activity:  Not on file  Stress: Not on file  Social Connections: Not on file   Outpatient Encounter Medications as of 12/25/2019  Medication Sig  . ACCU-CHEK AVIVA PLUS test strip USE TO CHECK BLOOD SUGAR 4 TIMES DAILY.  Marland Kitchen Accu-Chek FastClix Lancets MISC 1 each by Does not apply route 4 (four) times daily.  . Accu-Chek FastClix Lancets MISC 1 each by Other route 4 (four) times daily.  Marland Kitchen albuterol (PROAIR HFA) 108 (90 Base) MCG/ACT inhaler Inhale 2 puffs into the lungs every 4 (four) hours as needed for wheezing or shortness of breath. Sometimes only uses one puff  . albuterol (PROVENTIL) (2.5 MG/3ML) 0.083% nebulizer solution Take 3 mLs (2.5 mg total) by nebulization every 6 (six) hours as needed for wheezing or shortness of breath.  . allopurinol (ZYLOPRIM) 300 MG tablet Take 300 mg by mouth daily.    Marland Kitchen ALPRAZolam (XANAX) 0.5 MG tablet Take 1 tablet by mouth 2 (two) times daily as needed.  . Blood Glucose Monitoring Suppl (ACCU-CHEK GUIDE) w/Device KIT 1 Piece by Does not apply route as directed.  Marland Kitchen BYDUREON BCISE 2 MG/0.85ML AUIJ INJECT INTO THE SKIN ONCE A WEEK.  . cloNIDine (CATAPRES) 0.1 MG tablet Take 1 tablet (0.1 mg total) by mouth 2 (two)  times daily.  . diclofenac Sodium (VOLTAREN) 1 % GEL Apply topically 4 (four) times daily.  . diphenhydrAMINE (BENADRYL) 25 mg capsule Take 50 mg by mouth every 6 (six) hours as needed for itching.  . furosemide (LASIX) 40 MG tablet Take 1 tablet (40 mg total) by mouth daily.  Marland Kitchen glipiZIDE (GLUCOTROL XL) 5 MG 24 hr tablet TAKE 1 TABLET ONCE DAILY WITH BREAKFAST.  Marland Kitchen GLOBAL EASE INJECT PEN NEEDLES 31G X 8 MM MISC USE 4 TIMES DAILY.  Marland Kitchen HUMULIN R U-500 KWIKPEN 500 UNIT/ML kwikpen INJECT 120 UNITS WITH BREAKFAST AND LUNCH, 100 UNITS WITH SUPPER IF GLUCOSE ABOVE 90.  . indomethacin (INDOCIN) 50 MG capsule Take 50 mg by mouth daily.   . metFORMIN (GLUCOPHAGE) 500 MG tablet TAKE 2 TABLETS BY MOUTH TWICE DAILY.  . Multiple Vitamin (MULTIVITAMIN WITH MINERALS) TABS  tablet Take 1 tablet by mouth daily.  Marland Kitchen oxyCODONE-acetaminophen (PERCOCET) 7.5-325 MG tablet Take 1 tablet by mouth every 4 (four) hours as needed for severe pain.  . pantoprazole (PROTONIX) 40 MG tablet Take 1 tablet (40 mg total) by mouth daily. 30 minutes before breakfast  . potassium chloride (KLOR-CON) 10 MEQ tablet Take 20 mEq by mouth daily.  . pravastatin (PRAVACHOL) 40 MG tablet Take 40 mg by mouth daily.  . verapamil (CALAN-SR) 240 MG CR tablet Take 240 mg by mouth daily.  . Vitamin D, Ergocalciferol, (DRISDOL) 1.25 MG (50000 UNIT) CAPS capsule Take 1 capsule (50,000 Units total) by mouth once a week.   No facility-administered encounter medications on file as of 12/25/2019.   ALLERGIES: Allergies  Allergen Reactions  . Biaxin [Clarithromycin] Anaphylaxis and Shortness Of Breath   VACCINATION STATUS: Immunization History  Administered Date(s) Administered  . Influenza-Unspecified 10/18/2010, 10/18/2011, 10/18/2012, 12/11/2013, 09/11/2014, 10/13/2015, 09/06/2018  . Moderna Sars-Covid-2 Vaccination 04/01/2019, 04/29/2019  . Tdap 04/13/2016    Diabetes He presents for his follow-up diabetic visit. He has type 2 diabetes mellitus. Onset time: He was diagnosed at approximate age of 24 years. His disease course has been fluctuating. There are no hypoglycemic associated symptoms. Pertinent negatives for hypoglycemia include no confusion, headaches, pallor or seizures. Associated symptoms include polydipsia and polyuria. Pertinent negatives for diabetes include no chest pain, no fatigue, no polyphagia and no weakness. There are no hypoglycemic complications. Symptoms are improving. Diabetic complications include PVD. Risk factors for coronary artery disease include dyslipidemia, diabetes mellitus, family history, male sex, obesity and sedentary lifestyle. Current diabetic treatment includes intensive insulin program. He is compliant with treatment most of the time. His weight is increasing  steadily. He is following a generally unhealthy diet. When asked about meal planning, he reported none. He has had a previous visit with a dietitian. He never participates in exercise. His home blood glucose trend is decreasing steadily. His breakfast blood glucose range is generally 180-200 mg/dl. His lunch blood glucose range is generally 180-200 mg/dl. His dinner blood glucose range is generally 180-200 mg/dl. His bedtime blood glucose range is generally 180-200 mg/dl. His overall blood glucose range is 180-200 mg/dl. (He presents with near target glycemic profile, averaging 183 for the last 90 days.  His previsit labs show A1c reported at 7%, overall improving.  No hypoglycemia documented.  ) An ACE inhibitor/angiotensin II receptor blocker is being taken.  Hyperlipidemia This is a chronic problem. The current episode started more than 1 year ago. The problem is uncontrolled. Exacerbating diseases include diabetes and obesity. Associated symptoms include shortness of breath. Pertinent negatives include no chest pain or  myalgias. Current antihyperlipidemic treatment includes statins. Risk factors for coronary artery disease include diabetes mellitus, dyslipidemia, hypertension, male sex, obesity and a sedentary lifestyle.  Hypertension This is a chronic problem. The current episode started more than 1 year ago. The problem is uncontrolled. Associated symptoms include shortness of breath. Pertinent negatives include no chest pain, headaches, neck pain or palpitations. Past treatments include ACE inhibitors and calcium channel blockers. The current treatment provides no improvement. Compliance problems include diet.  Hypertensive end-organ damage includes PVD.     Review of systems  Limited as above.   Objective:    BP (!) 173/71   Pulse 76   Ht 5' 11"  (1.803 m)   Wt (!) 448 lb 3.2 oz (203.3 kg)   BMI 62.51 kg/m   Wt Readings from Last 3 Encounters:  12/25/19 (!) 448 lb 3.2 oz (203.3 kg)   08/23/19 (!) 447 lb (202.8 kg)  07/01/19 (!) 451 lb (204.6 kg)     Physical Exam- Limited    Results for orders placed or performed in visit on 10/07/19  HM DIABETES EYE EXAM  Result Value Ref Range   HM Diabetic Eye Exam No Retinopathy No Retinopathy   Diabetic Labs (most recent): Lab Results  Component Value Date   HGBA1C 7.8 05/01/2019   HGBA1C 7.7 (A) 01/22/2019   HGBA1C 8.5 (H) 10/10/2018   Lipid Panel     Component Value Date/Time   CHOL 137 04/01/2019 0843   TRIG 121 04/01/2019 0843   HDL 36 (L) 04/01/2019 0843   CHOLHDL 3.8 04/01/2019 0843   VLDL 31 (H) 03/12/2015 1200   LDLCALC 79 04/01/2019 0843   CMP Latest Ref Rng & Units 12/19/2019 04/01/2019 10/10/2018  Glucose 65 - 99 mg/dL 167(H) 215(H) 185(H)  BUN 6 - 24 mg/dL 12 13 14   Creatinine 0.76 - 1.27 mg/dL 0.78 0.77 0.75  Sodium 134 - 144 mmol/L 140 141 138  Potassium 3.5 - 5.2 mmol/L 4.8 4.6 4.5  Chloride 96 - 106 mmol/L 98 104 103  CO2 20 - 29 mmol/L 26 28 29   Calcium 8.7 - 10.2 mg/dL 10.6(H) 9.5 9.5  Total Protein 6.0 - 8.5 g/dL 6.3 6.6 6.2  Total Bilirubin 0.0 - 1.2 mg/dL 0.4 0.4 0.4  Alkaline Phos 44 - 121 IU/L 82 - -  AST 0 - 40 IU/L 32 32 23  ALT 0 - 44 IU/L 33 31 26     Assessment & Plan:   1. Uncontrolled type 2 diabetes mellitus with diabetic peripheral angiopathy without gangrene, with long-term current use of insulin ([place])   He presents with near target glycemic profile, averaging 183 for the last 90 days.  His previsit labs show A1c reported at 7%, overall improving.  No hypoglycemia documented.     He remains at extremely high risk for more acute and chronic complications of diabetes which include CAD, CVA, CKD, retinopathy, and neuropathy. These are all discussed in detail with the patient.   Glucose logs and insulin administration records pertaining to this visit,  to be scanned into patient's records.  Recent labs reviewed.  - I have re-counseled the patient on diet management and  weight loss  by adopting a carbohydrate restricted / protein rich  Diet.  -He still admits to dietary indiscretion including consumption of and sweetened beverages.  - he acknowledges that there is a room for improvement in his food and drink choices. - Suggestion is made for him to avoid simple carbohydrates  from his diet including  Cakes, Sweet Desserts, Ice Cream, Soda (diet and regular), Sweet Tea, Candies, Chips, Cookies, Store Bought Juices, Alcohol in Excess of  1-2 drinks a day, Artificial Sweeteners,  Coffee Creamer, and "Sugar-free" Products, Lemonade. This will help patient to have more stable blood glucose profile and potentially avoid unintended weight gain.   - Patient is advised to stick to a routine mealtimes to eat 3 meals  a day and avoid unnecessary snacks ( to snack only to correct hypoglycemia).   -He will continue to need multiple daily injections of high-dose insulin in order for him to maintain control of diabetes to target.  He promises to do better on his snacking habits.  For now, he is advised to continue  Humulin U 500 120 units with breakfast, 120 units with lunch, 100 units with supper  for pre-meal blood glucose readings of 90 or above milligrams per deciliter associated with strict monitoring of blood glucose 4 times a day-before meals and at bedtime.    -Patient is encouraged to call clinic for blood glucose levels less than 70 or above 300 mg /dl. -He is advised to continue Metformin 1000 mg p.o. twice daily-with breakfast and supper. -He is also advised to continue Bydureon 2 mg subcutaneously weekly.  He has benefited from glipizide intervention, advised to continue glipizide 5 mg XL p.o. daily at breakfast.  - He is a perfect candidate for bariatric surgery, he has been hesitant to consider this option. -He is following with Jearld Fenton, CDE for DM education.  - Patient specific target  for A1c; LDL, HDL, Triglycerides, and  Waist Circumference were  discussed in detail.  2) BP/HTN: -His blood pressure is not controlled to target.  Patient admits he has not taken his blood pressure medications this morning.    He is advised to proceed with his benazepril to 10 mg p.o. daily, also has verapamil 240 mg p.o. daily for blood pressure treatment.   3) Lipids/HPL: He has uncontrolled LDL, however improving to not 79 from 99 .  He is advised to continue pravastatin 40 mg p.o. nightly.    Side effects and precautions discussed with him.    4)  Weight/Diet: His BMI is [id number] complicating his diabetes care.  Is a candidate for modest weight loss.  He has had no significant success in weight control.  He still admits to dietary indiscretion.   CDE consult in progress, exercise, and carbohydrates information provided.  He hesitates to consider bariatric surgery.  5) Chronic Care/Health Maintenance:  -Patient  is  on ACEI/ARB and Statin medications and encouraged to continue to follow up with Ophthalmology, Podiatrist at least yearly or according to recommendations, and advised to  stay away from smoking. I have recommended yearly flu vaccine and pneumonia vaccination at least every 5 years; and  sleep for at least 7 hours a day. -This patient cannot exercise optimally due to his heavy weight and comorbidities.  - I advised patient to maintain close follow up with Leeanne Rio, MD for primary care needs.   - Time spent on this patient care encounter:  35 min, of which > 50% was spent in  counseling and the rest reviewing his blood glucose logs , discussing his hypoglycemia and hyperglycemia episodes, reviewing his current and  previous labs / studies  ( including abstraction from other facilities) and medications  doses and developing a  long term treatment plan and documenting his care.   Please refer to Patient Instructions for Blood Glucose Monitoring  and Insulin/Medications Dosing Guide"  in media tab for additional information. Please  also  refer to " Patient Self Inventory" in the Media  tab for reviewed elements of pertinent patient history.  Aaron Potter participated in the discussions, expressed understanding, and voiced agreement with the above plans.  All questions were answered to his satisfaction. he is encouraged to contact clinic should he have any questions or concerns prior to his return visit.   Follow up plan: -Return in about 4 months (around 04/24/2020) for Bring Meter and Logs- A1c in Office.  Glade Lloyd, MD Phone: [phone]  Fax: [phone]  -  This note was partially dictated with voice recognition software. Similar sounding words can be transcribed inadequately or may not  be corrected upon review.  12/25/2019, 12:55 PM

## 2019-12-25 NOTE — Patient Instructions (Signed)

## 2019-12-30 ENCOUNTER — Other Ambulatory Visit: Payer: Self-pay | Admitting: "Endocrinology

## 2020-01-05 ENCOUNTER — Other Ambulatory Visit: Payer: Self-pay | Admitting: "Endocrinology

## 2020-01-05 DIAGNOSIS — E1151 Type 2 diabetes mellitus with diabetic peripheral angiopathy without gangrene: Secondary | ICD-10-CM

## 2020-01-05 DIAGNOSIS — IMO0002 Reserved for concepts with insufficient information to code with codable children: Secondary | ICD-10-CM

## 2020-02-03 ENCOUNTER — Other Ambulatory Visit: Payer: Self-pay | Admitting: "Endocrinology

## 2020-02-27 ENCOUNTER — Other Ambulatory Visit: Payer: Self-pay | Admitting: "Endocrinology

## 2020-03-23 ENCOUNTER — Other Ambulatory Visit: Payer: Self-pay | Admitting: "Endocrinology

## 2020-03-23 DIAGNOSIS — IMO0002 Reserved for concepts with insufficient information to code with codable children: Secondary | ICD-10-CM

## 2020-03-23 DIAGNOSIS — E1151 Type 2 diabetes mellitus with diabetic peripheral angiopathy without gangrene: Secondary | ICD-10-CM

## 2020-03-25 ENCOUNTER — Other Ambulatory Visit: Payer: Self-pay

## 2020-03-25 ENCOUNTER — Encounter: Payer: Self-pay | Admitting: "Endocrinology

## 2020-03-25 ENCOUNTER — Ambulatory Visit (INDEPENDENT_AMBULATORY_CARE_PROVIDER_SITE_OTHER): Payer: Medicaid Other | Admitting: "Endocrinology

## 2020-03-25 VITALS — BP 122/64 | HR 72 | Ht 71.0 in | Wt >= 6400 oz

## 2020-03-25 DIAGNOSIS — I10 Essential (primary) hypertension: Secondary | ICD-10-CM | POA: Diagnosis not present

## 2020-03-25 DIAGNOSIS — E1151 Type 2 diabetes mellitus with diabetic peripheral angiopathy without gangrene: Secondary | ICD-10-CM

## 2020-03-25 DIAGNOSIS — E782 Mixed hyperlipidemia: Secondary | ICD-10-CM | POA: Diagnosis not present

## 2020-03-25 DIAGNOSIS — E1165 Type 2 diabetes mellitus with hyperglycemia: Secondary | ICD-10-CM

## 2020-03-25 DIAGNOSIS — IMO0002 Reserved for concepts with insufficient information to code with codable children: Secondary | ICD-10-CM

## 2020-03-25 DIAGNOSIS — Z794 Long term (current) use of insulin: Secondary | ICD-10-CM

## 2020-03-25 DIAGNOSIS — E559 Vitamin D deficiency, unspecified: Secondary | ICD-10-CM

## 2020-03-25 LAB — POCT GLYCOSYLATED HEMOGLOBIN (HGB A1C): HbA1c, POC (controlled diabetic range): 7.8 % — AB (ref 0.0–7.0)

## 2020-03-25 NOTE — Progress Notes (Signed)
03/25/2020   Endocrinology follow-up note  Subjective:    Patient ID: Aaron Potter, male    DOB: 1960-07-03, PCP Aaron Rio, MD   Past Medical History:  Diagnosis Date  . Allergic rhinitis, unspecified   . Allergy   . Anxiety disorder, unspecified   . Asthma   . Back pain   . Balanitis   . Cellulitis   . COPD (chronic obstructive pulmonary disease) ([place])   . Diabetes mellitus without complication ([place])   . Essential (primary) hypertension   . Gastro-esophageal reflux disease with esophagitis   . Gout   . Gout, unspecified   . Hypertension   . Lichen sclerosus   . Mixed hyperlipidemia   . Morbid (severe) obesity with alveolar hypoventilation ([place])   . Obesity, morbid (more than 100 lbs over ideal weight or BMI > 40) ([facility])   . Obesity, unspecified   . Polyosteoarthritis, unspecified   . Sleep apnea   . Sleep apnea, unspecified    CPAP  . Type 2 diabetes mellitus with hyperglycemia ([place])   . Urticaria   . 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 CHOLECYSTECTOMY    . ERCP  [place], CBD stone s/p cholecystectomy  . FOOT SURGERY Right 04/2017   Social History   Socioeconomic History  . Marital status: Single    Spouse name: Not on file  . Number of children: Not on file  . Years of education: Not on file  . Highest education level: Not on file  Occupational History  . Not on file  Tobacco Use  . Smoking status: Never Smoker  . Smokeless tobacco: Never Used  Vaping Use  . Vaping Use: Never used  Substance and Sexual Activity  . Alcohol use: No    Alcohol/week: 0.0 standard drinks  . Drug use: No  . Sexual activity: Yes  Other Topics Concern  . Not on file  Social History Narrative  . Not on file   Social Determinants of Health   Financial Resource Strain: Not on file  Food Insecurity: Not on file  Transportation Needs: Not on file  Physical Activity:  Not on file  Stress: Not on file  Social Connections: Not on file   Outpatient Encounter Medications as of 03/25/2020  Medication Sig  . ACCU-CHEK AVIVA PLUS test strip USE TO CHECK BLOOD SUGAR 4 TIMES DAILY.  Marland Kitchen Accu-Chek FastClix Lancets MISC USE TO CHECK BLOOD SUGAR FOUR TIMES A DAY.  Marland Kitchen albuterol (PROAIR HFA) 108 (90 Base) MCG/ACT inhaler Inhale 2 puffs into the lungs every 4 (four) hours as needed for wheezing or shortness of breath. Sometimes only uses one puff  . albuterol (PROVENTIL) (2.5 MG/3ML) 0.083% nebulizer solution Take 3 mLs (2.5 mg total) by nebulization every 6 (six) hours as needed for wheezing or shortness of breath.  . allopurinol (ZYLOPRIM) 300 MG tablet Take 300 mg by mouth daily.    Marland Kitchen ALPRAZolam (XANAX) 0.5 MG tablet Take 1 tablet by mouth 2 (two) times daily as needed.  . Blood Glucose Monitoring Suppl (ACCU-CHEK GUIDE) w/Device KIT 1 Piece by Does not apply route as directed.  Marland Kitchen BYDUREON BCISE 2 MG/0.85ML AUIJ INJECT INTO THE SKIN ONCE A WEEK.  . cloNIDine (CATAPRES) 0.1 MG tablet Take 1 tablet (0.1 mg total) by mouth 2 (two) times daily.  . diclofenac Sodium (VOLTAREN) 1 % GEL Apply topically 4 (four) times daily.  Marland Kitchen  diphenhydrAMINE (BENADRYL) 25 mg capsule Take 50 mg by mouth every 6 (six) hours as needed for itching.  . furosemide (LASIX) 40 MG tablet Take 1 tablet (40 mg total) by mouth daily.  Marland Kitchen glipiZIDE (GLUCOTROL XL) 5 MG 24 hr tablet TAKE 1 TABLET ONCE DAILY WITH BREAKFAST.  Marland Kitchen GLOBAL EASE INJECT PEN NEEDLES 31G X 8 MM MISC USE 4 TIMES DAILY.  Marland Kitchen HUMULIN R U-500 KWIKPEN 500 UNIT/ML kwikpen INJECT 120 UNITS WITH BREAKFAST AND LUNCH, 100 UNITS WITH SUPPER IF GLUCOSE ABOVE 90.  . indomethacin (INDOCIN) 50 MG capsule Take 50 mg by mouth daily.   . metFORMIN (GLUCOPHAGE) 500 MG tablet TAKE 2 TABLETS BY MOUTH TWICE DAILY.  . Multiple Vitamin (MULTIVITAMIN WITH MINERALS) TABS tablet Take 1 tablet by mouth daily.  Marland Kitchen oxyCODONE-acetaminophen (PERCOCET) 7.5-325 MG tablet  Take 1 tablet by mouth every 4 (four) hours as needed for severe pain.  . pantoprazole (PROTONIX) 40 MG tablet Take 1 tablet (40 mg total) by mouth daily. 30 minutes before breakfast  . potassium chloride (KLOR-CON) 10 MEQ tablet Take 20 mEq by mouth daily.  . pravastatin (PRAVACHOL) 40 MG tablet Take 40 mg by mouth daily.  . verapamil (CALAN-SR) 240 MG CR tablet Take 240 mg by mouth daily.  . Vitamin D, Ergocalciferol, (DRISDOL) 1.25 MG (50000 UNIT) CAPS capsule Take 1 capsule (50,000 Units total) by mouth once a week.  . [DISCONTINUED] Accu-Chek FastClix Lancets MISC 1 each by Does not apply route 4 (four) times daily.  . [DISCONTINUED] Accu-Chek FastClix Lancets MISC 1 each by Other route 4 (four) times daily.  . [DISCONTINUED] Accu-Chek FastClix Lancets MISC USE TO CHECK BLOOD SUGAR FOUR TIMES A DAY.   No facility-administered encounter medications on file as of 03/25/2020.   ALLERGIES: Allergies  Allergen Reactions  . Biaxin [Clarithromycin] Anaphylaxis and Shortness Of Breath   VACCINATION STATUS: Immunization History  Administered Date(s) Administered  . Influenza-Unspecified 10/18/2010, 10/18/2011, 10/18/2012, 12/11/2013, 09/11/2014, 10/13/2015, 09/06/2018  . Moderna Sars-Covid-2 Vaccination 04/01/2019, 04/29/2019  . Tdap 04/13/2016    Diabetes He presents for his follow-up diabetic visit. He has type 2 diabetes mellitus. Onset time: He was diagnosed at approximate age of 55 years. His disease course has been stable. There are no hypoglycemic associated symptoms. Pertinent negatives for hypoglycemia include no confusion, headaches, pallor or seizures. Associated symptoms include polydipsia and polyuria. Pertinent negatives for diabetes include no chest pain, no fatigue, no polyphagia and no weakness. There are no hypoglycemic complications. Symptoms are stable. Diabetic complications include PVD. Risk factors for coronary artery disease include dyslipidemia, diabetes mellitus, family  history, male sex, obesity and sedentary lifestyle. Current diabetic treatment includes intensive insulin program. He is compliant with treatment most of the time. His weight is increasing steadily. He is following a generally unhealthy diet. When asked about meal planning, he reported none. He has had a previous visit with a dietitian. He never participates in exercise. His home blood glucose trend is fluctuating minimally. His breakfast blood glucose range is generally 140-180 mg/dl. His lunch blood glucose range is generally 140-180 mg/dl. His dinner blood glucose range is generally 140-180 mg/dl. His bedtime blood glucose range is generally 140-180 mg/dl. His overall blood glucose range is 140-180 mg/dl. (He presents with minimally fluctuating, close to target glycemic profile.  No major hypoglycemia.  His point-of-care A1c 7.8%, stable.  ) An ACE inhibitor/angiotensin II receptor blocker is being taken.  Hyperlipidemia This is a chronic problem. The current episode started more than 1 year ago.  The problem is uncontrolled. Exacerbating diseases include diabetes and obesity. Associated symptoms include shortness of breath. Pertinent negatives include no chest pain or myalgias. Current antihyperlipidemic treatment includes statins. Risk factors for coronary artery disease include diabetes mellitus, dyslipidemia, hypertension, male sex, obesity and a sedentary lifestyle.  Hypertension This is a chronic problem. The current episode started more than 1 year ago. The problem is uncontrolled. Associated symptoms include shortness of breath. Pertinent negatives include no chest pain, headaches, neck pain or palpitations. Past treatments include ACE inhibitors and calcium channel blockers. The current treatment provides no improvement. Compliance problems include diet.  Hypertensive end-organ damage includes PVD.     Review of systems  Limited as above.   Objective:    BP 122/64   Pulse 72   Ht 5' 11"   (1.803 m)   Wt (!) 454 lb 9.6 oz (206.2 kg)   BMI 63.40 kg/m   Wt Readings from Last 3 Encounters:  03/25/20 (!) 454 lb 9.6 oz (206.2 kg)  12/25/19 (!) 448 lb 3.2 oz (203.3 kg)  08/23/19 (!) 447 lb (202.8 kg)     Physical Exam- Limited  Edematous lower extremities. Calluses, poor circulation on bilateral lower extremities.  Diminished monofilament test. Results for orders placed or performed in visit on 03/25/20  Hemoglobin A1c  Result Value Ref Range   Hemoglobin A1C 7   HgB A1c  Result Value Ref Range   Hemoglobin A1C     HbA1c POC (<> result, manual entry)     HbA1c, POC (prediabetic range)     HbA1c, POC (controlled diabetic range) 7.8 (A) 0.0 - 7.0 %   Diabetic Labs (most recent): Lab Results  Component Value Date   HGBA1C 7.8 (A) 03/25/2020   HGBA1C 7 12/25/2019   HGBA1C 7.8 05/01/2019   Lipid Panel     Component Value Date/Time   CHOL 137 04/01/2019 0843   TRIG 121 04/01/2019 0843   HDL 36 (L) 04/01/2019 0843   CHOLHDL 3.8 04/01/2019 0843   VLDL 31 (H) 03/12/2015 1200   LDLCALC 79 04/01/2019 0843   CMP Latest Ref Rng & Units 12/19/2019 04/01/2019 10/10/2018  Glucose 65 - 99 mg/dL 167(H) 215(H) 185(H)  BUN 6 - 24 mg/dL 12 13 14   Creatinine 0.76 - 1.27 mg/dL 0.78 0.77 0.75  Sodium 134 - 144 mmol/L 140 141 138  Potassium 3.5 - 5.2 mmol/L 4.8 4.6 4.5  Chloride 96 - 106 mmol/L 98 104 103  CO2 20 - 29 mmol/L 26 28 29   Calcium 8.7 - 10.2 mg/dL 10.6(H) 9.5 9.5  Total Protein 6.0 - 8.5 g/dL 6.3 6.6 6.2  Total Bilirubin 0.0 - 1.2 mg/dL 0.4 0.4 0.4  Alkaline Phos 44 - 121 IU/L 82 - -  AST 0 - 40 IU/L 32 32 23  ALT 0 - 44 IU/L 33 31 26     Assessment & Plan:   1. Uncontrolled type 2 diabetes mellitus with diabetic peripheral angiopathy without gangrene, with long-term current use of insulin ([place])   He presents with minimally fluctuating, close to target glycemic profile.  No major hypoglycemia.  His point-of-care A1c 7.8%, stable.    He remains at extremely  high risk for more acute and chronic complications of diabetes which include CAD, CVA, CKD, retinopathy, and neuropathy. These are all discussed in detail with the patient.   Glucose logs and insulin administration records pertaining to this visit,  to be scanned into patient's records.  Recent labs reviewed.  - I have re-counseled  the patient on diet management and weight loss  by adopting a carbohydrate restricted / protein rich  Diet.  -He still admits to dietary indiscretion including consumption of and sweetened beverages.  - he acknowledges that there is a room for improvement in his food and drink choices. - Suggestion is made for him to avoid simple carbohydrates  from his diet including Cakes, Sweet Desserts, Ice Cream, Soda (diet and regular), Sweet Tea, Candies, Chips, Cookies, Store Bought Juices, Alcohol in Excess of  1-2 drinks a day, Artificial Sweeteners,  Coffee Creamer, and "Sugar-free" Products, Lemonade. This will help patient to have more stable blood glucose profile and potentially avoid unintended weight gain.   - Patient is advised to stick to a routine mealtimes to eat 3 meals  a day and avoid unnecessary snacks ( to snack only to correct hypoglycemia).   -He will continue to need multiple daily injections of high-dose insulin in order for him to maintain control of diabetes to target.   - He promises to do better on his snacking habits.  For now, he is advised to continue Humulin  U 500 120 units with breakfast, 120 units with lunch, 100 units with supper  for pre-meal blood glucose readings of 90 or above milligrams per deciliter associated with strict monitoring of blood glucose 4 times a day-before meals and at bedtime.    -Patient is encouraged to call clinic for blood glucose levels less than 70 or above 300 mg /dl. -He is advised to continue Metformin 1000 mg p.o. twice daily-with breakfast and supper.    -He is also advised to continue Bydureon 2 mg  subcutaneously weekly.  He has benefited from glipizide intervention, advised to continue glipizide 5 mg XL p.o. daily at breakfast.  - He is a perfect candidate for bariatric surgery, he has been hesitant to consider this option. -He is following with Jearld Fenton, CDE for DM education.  - Patient specific target  for A1c; LDL, HDL, Triglycerides,  were discussed in detail.  2) BP/HTN: -His blood pressure is controlled to target.  Patient admits he has not taken his blood pressure medications this morning.    He is advised to proceed with his benazepril to 10 mg p.o. daily, also has verapamil 240 mg p.o. daily for blood pressure treatment.   3) Lipids/HPL: He has uncontrolled LDL, however improving to not 79 from 99 .  He is advised to continue pravastatin 40 mg p.o. nightly.      Side effects and precautions discussed with him.    4)  Weight/Diet: His BMI is 16.1 -clearly complicating his diabetes care.  Is a candidate for modest weight loss.  He has had no significant success in weight control.  He still admits to dietary indiscretion.   CDE consult in progress, exercise, and carbohydrates information provided.  He hesitates to consider bariatric surgery. Would benefit from a pair of diabetic shoes.  I have filled out paperwork for him to get a pair from his pharmacy.  5) Chronic Care/Health Maintenance:  -Patient  is  on ACEI/ARB and Statin medications and encouraged to continue to follow up with Ophthalmology, Podiatrist at least yearly or according to recommendations, and advised to  stay away from smoking. I have recommended yearly flu vaccine and pneumonia vaccination at least every 5 years; and  sleep for at least 7 hours a day. -This patient cannot exercise optimally due to his heavy weight and comorbidities.  - I advised patient to maintain close  follow up with Aaron Rio, MD for primary care needs.   - Time spent on this patient care encounter:  40 min, of which > 50%  was spent in  counseling and the rest reviewing his blood glucose logs , discussing his hypoglycemia and hyperglycemia episodes, reviewing his current and  previous labs / studies  ( including abstraction from other facilities) and medications  doses and developing a  long term treatment plan and documenting his care.   Please refer to Patient Instructions for Blood Glucose Monitoring and Insulin/Medications Dosing Guide"  in media tab for additional information. Please  also refer to " Patient Self Inventory" in the Media  tab for reviewed elements of pertinent patient history.  Aaron Potter participated in the discussions, expressed understanding, and voiced agreement with the above plans.  All questions were answered to his satisfaction. he is encouraged to contact clinic should he have any questions or concerns prior to his return visit.    Follow up plan: -Return in about 3 months (around 06/25/2020) for F/U with Pre-visit Labs, Meter, Logs, A1c here.Glade Lloyd, MD Phone: [phone]  Fax: [phone]  -  This note was partially dictated with voice recognition software. Similar sounding words can be transcribed inadequately or may not  be corrected upon review.  03/25/2020, 10:32 AM

## 2020-03-25 NOTE — Patient Instructions (Signed)

## 2020-03-30 ENCOUNTER — Other Ambulatory Visit: Payer: Self-pay | Admitting: "Endocrinology

## 2020-04-03 ENCOUNTER — Other Ambulatory Visit: Payer: Self-pay | Admitting: Gastroenterology

## 2020-04-08 ENCOUNTER — Telehealth: Payer: Self-pay | Admitting: "Endocrinology

## 2020-04-08 NOTE — Telephone Encounter (Signed)
Returned call, person that needed to speak with was not available , she will call back.

## 2020-04-08 NOTE — Telephone Encounter (Signed)
[employer], the pharmacy tech stated she would have the appropriate person call the office back.

## 2020-04-08 NOTE — Telephone Encounter (Signed)
[employer] is calling and requesting to speak to nurse in regards to him. cb [phone] case # [id number]

## 2020-04-17 ENCOUNTER — Other Ambulatory Visit: Payer: Self-pay | Admitting: "Endocrinology

## 2020-04-17 DIAGNOSIS — E1151 Type 2 diabetes mellitus with diabetic peripheral angiopathy without gangrene: Secondary | ICD-10-CM

## 2020-04-17 DIAGNOSIS — IMO0002 Reserved for concepts with insufficient information to code with codable children: Secondary | ICD-10-CM

## 2020-04-28 ENCOUNTER — Other Ambulatory Visit: Payer: Self-pay | Admitting: "Endocrinology

## 2020-04-28 ENCOUNTER — Other Ambulatory Visit: Payer: Self-pay | Admitting: Internal Medicine

## 2020-05-03 ENCOUNTER — Other Ambulatory Visit: Payer: Self-pay | Admitting: Internal Medicine

## 2020-05-04 NOTE — Telephone Encounter (Signed)
Should be getting this thru whoever is writing his other BP meds but ot ok x 3 months or whenever he's going to see that provider (whichever comes first)

## 2020-05-08 ENCOUNTER — Other Ambulatory Visit: Payer: Self-pay | Admitting: "Endocrinology

## 2020-05-08 DIAGNOSIS — E1165 Type 2 diabetes mellitus with hyperglycemia: Secondary | ICD-10-CM

## 2020-05-08 DIAGNOSIS — IMO0002 Reserved for concepts with insufficient information to code with codable children: Secondary | ICD-10-CM

## 2020-06-01 ENCOUNTER — Other Ambulatory Visit: Payer: Self-pay | Admitting: Internal Medicine

## 2020-06-01 ENCOUNTER — Other Ambulatory Visit: Payer: Self-pay | Admitting: "Endocrinology

## 2020-06-01 DIAGNOSIS — E1151 Type 2 diabetes mellitus with diabetic peripheral angiopathy without gangrene: Secondary | ICD-10-CM

## 2020-06-01 DIAGNOSIS — IMO0002 Reserved for concepts with insufficient information to code with codable children: Secondary | ICD-10-CM

## 2020-06-02 ENCOUNTER — Other Ambulatory Visit: Payer: Self-pay | Admitting: Internal Medicine

## 2020-06-23 LAB — COMPREHENSIVE METABOLIC PANEL
ALT: 32 IU/L (ref 0–44)
AST: 42 IU/L — ABNORMAL HIGH (ref 0–40)
Albumin/Globulin Ratio: 1.4 (ref 1.2–2.2)
Albumin: 4 g/dL (ref 3.8–4.9)
Alkaline Phosphatase: 94 IU/L (ref 44–121)
BUN/Creatinine Ratio: 12 (ref 9–20)
BUN: 11 mg/dL (ref 6–24)
Bilirubin Total: 0.4 mg/dL (ref 0.0–1.2)
CO2: 29 mmol/L (ref 20–29)
Calcium: 9.3 mg/dL (ref 8.7–10.2)
Chloride: 95 mmol/L — ABNORMAL LOW (ref 96–106)
Creatinine, Ser: 0.93 mg/dL (ref 0.76–1.27)
Globulin, Total: 2.9 g/dL (ref 1.5–4.5)
Glucose: 215 mg/dL — ABNORMAL HIGH (ref 65–99)
Potassium: 4.5 mmol/L (ref 3.5–5.2)
Sodium: 139 mmol/L (ref 134–144)
Total Protein: 6.9 g/dL (ref 6.0–8.5)
eGFR: 96 mL/min/{1.73_m2} (ref >59)

## 2020-06-25 ENCOUNTER — Other Ambulatory Visit: Payer: Self-pay | Admitting: "Endocrinology

## 2020-06-25 DIAGNOSIS — E1151 Type 2 diabetes mellitus with diabetic peripheral angiopathy without gangrene: Secondary | ICD-10-CM

## 2020-06-25 DIAGNOSIS — IMO0002 Reserved for concepts with insufficient information to code with codable children: Secondary | ICD-10-CM

## 2020-06-26 ENCOUNTER — Other Ambulatory Visit: Payer: Self-pay

## 2020-06-26 ENCOUNTER — Ambulatory Visit (INDEPENDENT_AMBULATORY_CARE_PROVIDER_SITE_OTHER): Payer: Medicaid Other | Admitting: "Endocrinology

## 2020-06-26 ENCOUNTER — Encounter: Payer: Self-pay | Admitting: "Endocrinology

## 2020-06-26 VITALS — BP 112/58 | HR 72 | Ht 71.0 in | Wt >= 6400 oz

## 2020-06-26 DIAGNOSIS — E1151 Type 2 diabetes mellitus with diabetic peripheral angiopathy without gangrene: Secondary | ICD-10-CM | POA: Diagnosis not present

## 2020-06-26 DIAGNOSIS — IMO0002 Reserved for concepts with insufficient information to code with codable children: Secondary | ICD-10-CM

## 2020-06-26 DIAGNOSIS — E1165 Type 2 diabetes mellitus with hyperglycemia: Secondary | ICD-10-CM

## 2020-06-26 DIAGNOSIS — I10 Essential (primary) hypertension: Secondary | ICD-10-CM | POA: Diagnosis not present

## 2020-06-26 DIAGNOSIS — E782 Mixed hyperlipidemia: Secondary | ICD-10-CM | POA: Diagnosis not present

## 2020-06-26 DIAGNOSIS — Z794 Long term (current) use of insulin: Secondary | ICD-10-CM

## 2020-06-26 LAB — POCT GLYCOSYLATED HEMOGLOBIN (HGB A1C): HbA1c, POC (controlled diabetic range): 8 % — AB (ref 0.0–7.0)

## 2020-06-26 MED ORDER — HUMULIN R U-500 KWIKPEN 500 UNIT/ML ~~LOC~~ SOPN: PEN_INJECTOR | SUBCUTANEOUS | 1 refills | Status: DC

## 2020-06-26 NOTE — Patient Instructions (Signed)

## 2020-06-26 NOTE — Progress Notes (Signed)
06/26/2020   Endocrinology follow-up note  Subjective:    Patient ID: Aaron Potter, male    DOB: 06-Dec-1960, PCP Leeanne Rio, MD   Past Medical History:  Diagnosis Date   Allergic rhinitis, unspecified    Allergy    Anxiety disorder, unspecified    Asthma    Back pain    Balanitis    Cellulitis    COPD (chronic obstructive pulmonary disease) ([place])    Diabetes mellitus without complication ([place])    Essential (primary) hypertension    Gastro-esophageal reflux disease with esophagitis    Gout    Gout, unspecified    Hypertension    Lichen sclerosus    Mixed hyperlipidemia    Morbid (severe) obesity with alveolar hypoventilation (HCC)    Obesity, morbid (more than 100 lbs over ideal weight or BMI > 40) (HCC)    Obesity, unspecified    Polyosteoarthritis, unspecified    Sleep apnea    Sleep apnea, unspecified    CPAP   Type 2 diabetes mellitus with hyperglycemia (HCC)    Urticaria    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    Past Surgical History:  Procedure Laterality Date   CHOLECYSTECTOMY     ERCP  [place], [facility] stone s/p cholecystectomy   FOOT SURGERY Right 04/2017   Social History   Socioeconomic History   Marital status: Single    Spouse name: Not on file   Number of children: Not on file   Years of education: Not on file   Highest education level: Not on file  Occupational History   Not on file  Tobacco Use   Smoking status: Never   Smokeless tobacco: Never  Vaping Use   Vaping Use: Never used  Substance and Sexual Activity   Alcohol use: No    Alcohol/week: 0.0 standard drinks   Drug use: No   Sexual activity: Yes  Other Topics Concern   Not on file  Social History Narrative   Not on file   Social Determinants of Health   Financial Resource Strain: Not on file  Food Insecurity: Not on file  Transportation Needs: Not on file  Physical Activity: Not on file  Stress: Not on file  Social  Connections: Not on file   Outpatient Encounter Medications as of 06/26/2020  Medication Sig   ACCU-CHEK AVIVA PLUS test strip USE TO CHECK BLOOD SUGAR 4 TIMES DAILY.   Accu-Chek FastClix Lancets MISC USE TO CHECK BLOOD SUGAR FOUR TIMES A DAY.   albuterol (PROAIR HFA) 108 (90 Base) MCG/ACT inhaler Inhale 2 puffs into the lungs every 4 (four) hours as needed for wheezing or shortness of breath. Sometimes only uses one puff   albuterol (PROVENTIL) (2.5 MG/3ML) 0.083% nebulizer solution Take 3 mLs (2.5 mg total) by nebulization every 6 (six) hours as needed for wheezing or shortness of breath.   allopurinol (ZYLOPRIM) 300 MG tablet Take 300 mg by mouth daily.     ALPRAZolam (XANAX) 0.5 MG tablet Take 1 tablet by mouth 2 (two) times daily as needed.   Blood Glucose Monitoring Suppl (ACCU-CHEK GUIDE) w/Device KIT 1 Piece by Does not apply route as directed.   BYDUREON BCISE 2 MG/0.85ML AUIJ INJECT INTO THE SKIN ONCE A WEEK.   cloNIDine (CATAPRES) 0.1 MG tablet TAKE (1) TABLET TWICE DAILY.   diclofenac Sodium (VOLTAREN) 1 % GEL Apply topically 4 (four) times daily.   diphenhydrAMINE (BENADRYL) 25 mg capsule Take 50 mg  by mouth every 6 (six) hours as needed for itching.   furosemide (LASIX) 40 MG tablet Take 1 tablet (40 mg total) by mouth daily.   glipiZIDE (GLUCOTROL XL) 5 MG 24 hr tablet TAKE 1 TABLET ONCE DAILY WITH BREAKFAST.   GLOBAL EASE INJECT PEN NEEDLES 31G X 8 MM MISC USE 4 TIMES DAILY.   indomethacin (INDOCIN) 50 MG capsule Take 50 mg by mouth daily.    insulin regular human CONCENTRATED (HUMULIN R U-500 KWIKPEN) 500 UNIT/ML kwikpen INJECT 120 UNITS WITH BREAKFAST AND LUNCH, 110 UNITS WITH SUPPER IF GLUCOSE ABOVE 90.   metFORMIN (GLUCOPHAGE) 500 MG tablet TAKE 2 TABLETS BY MOUTH TWICE DAILY.   Multiple Vitamin (MULTIVITAMIN WITH MINERALS) TABS tablet Take 1 tablet by mouth daily.   oxyCODONE-acetaminophen (PERCOCET) 7.5-325 MG tablet Take 1 tablet by mouth every 4 (four) hours as needed for  severe pain.   pantoprazole (PROTONIX) 40 MG tablet TAKE 1 TABLET DAILY, 30 MINUTES BEFORE BREAKFAST `   potassium chloride (KLOR-CON) 10 MEQ tablet Take 20 mEq by mouth daily.   pravastatin (PRAVACHOL) 40 MG tablet Take 40 mg by mouth daily.   verapamil (CALAN-SR) 240 MG CR tablet Take 240 mg by mouth daily.   Vitamin D, Ergocalciferol, (DRISDOL) 1.25 MG (50000 UNIT) CAPS capsule Take 1 capsule (50,000 Units total) by mouth once a week.   [DISCONTINUED] HUMULIN R U-500 KWIKPEN 500 UNIT/ML kwikpen INJECT 120 UNITS WITH BREAKFAST AND LUNCH, 100 UNITS WITH SUPPER IF GLUCOSE ABOVE 90.   No facility-administered encounter medications on file as of 06/26/2020.   ALLERGIES: Allergies  Allergen Reactions   Biaxin [Clarithromycin] Anaphylaxis and Shortness Of Breath   VACCINATION STATUS: Immunization History  Administered Date(s) Administered   Influenza-Unspecified 10/18/2010, 10/18/2011, 10/18/2012, 12/11/2013, 09/11/2014, 10/13/2015, 09/06/2018   Moderna Sars-Covid-2 Vaccination 04/01/2019, 04/29/2019   Tdap 04/13/2016    Diabetes He presents for his follow-up diabetic visit. He has type 2 diabetes mellitus. Onset time: He was diagnosed at approximate age of 61 years. His disease course has been worsening. There are no hypoglycemic associated symptoms. Pertinent negatives for hypoglycemia include no confusion, headaches, pallor or seizures. Associated symptoms include polydipsia and polyuria. Pertinent negatives for diabetes include no chest pain, no fatigue, no polyphagia and no weakness. There are no hypoglycemic complications. Symptoms are worsening. Diabetic complications include PVD. Risk factors for coronary artery disease include dyslipidemia, diabetes mellitus, family history, male sex, obesity and sedentary lifestyle. Current diabetic treatment includes intensive insulin program. He is compliant with treatment most of the time. His weight is increasing steadily. He is following a generally  unhealthy diet. When asked about meal planning, he reported none. He has had a previous visit with a dietitian. He never participates in exercise. His home blood glucose trend is increasing steadily. His breakfast blood glucose range is generally 140-180 mg/dl. His lunch blood glucose range is generally 140-180 mg/dl. His dinner blood glucose range is generally 140-180 mg/dl. His bedtime blood glucose range is generally 140-180 mg/dl. His overall blood glucose range is 140-180 mg/dl. (He presents with above target glycemic profile.  Point-of-care A1c is 8% increasing from 7.8% during his last visit.  No hypoglycemia.  He is glycemic profile is consistent and mostly between 140-180 mg per DL.  ) An ACE inhibitor/angiotensin II receptor blocker is being taken.  Hyperlipidemia This is a chronic problem. The current episode started more than 1 year ago. The problem is uncontrolled. Exacerbating diseases include diabetes and obesity. Associated symptoms include shortness of breath. Pertinent  negatives include no chest pain or myalgias. Current antihyperlipidemic treatment includes statins. Risk factors for coronary artery disease include diabetes mellitus, dyslipidemia, hypertension, male sex, obesity and a sedentary lifestyle.  Hypertension This is a chronic problem. The current episode started more than 1 year ago. The problem is uncontrolled. Associated symptoms include shortness of breath. Pertinent negatives include no chest pain, headaches, neck pain or palpitations. Risk factors for coronary artery disease include diabetes mellitus, dyslipidemia, obesity, male gender, sedentary lifestyle and family history. Past treatments include ACE inhibitors and calcium channel blockers. The current treatment provides no improvement. Compliance problems include diet.  Hypertensive end-organ damage includes PVD.    Review of systems  Limited as above.   Objective:    BP (!) 112/58   Pulse 72   Ht 5' 11"  (1.803  m)   Wt (!) 456 lb (206.8 kg)   BMI 63.60 kg/m   Wt Readings from Last 3 Encounters:  06/26/20 (!) 456 lb (206.8 kg)  03/25/20 (!) 454 lb 9.6 oz (206.2 kg)  12/25/19 (!) 448 lb 3.2 oz (203.3 kg)     Physical Exam- Limited  Edematous lower extremities. Calluses, poor circulation on bilateral lower extremities.  Diminished monofilament test. Results for orders placed or performed in visit on 06/26/20  HgB A1c  Result Value Ref Range   Hemoglobin A1C     HbA1c POC (<> result, manual entry)     HbA1c, POC (prediabetic range)     HbA1c, POC (controlled diabetic range) 8.0 (A) 0.0 - 7.0 %   Diabetic Labs (most recent): Lab Results  Component Value Date   HGBA1C 8.0 (A) 06/26/2020   HGBA1C 7.8 (A) 03/25/2020   HGBA1C 7 12/25/2019   Lipid Panel     Component Value Date/Time   CHOL 137 04/01/2019 0843   TRIG 121 04/01/2019 0843   HDL 36 (L) 04/01/2019 0843   CHOLHDL 3.8 04/01/2019 0843   VLDL 31 (H) 03/12/2015 1200   LDLCALC 79 04/01/2019 0843   CMP Latest Ref Rng & Units 06/22/2020 12/19/2019 04/01/2019  Glucose 65 - 99 mg/dL 215(H) 167(H) 215(H)  BUN 6 - 24 mg/dL 11 12 13   Creatinine 0.76 - 1.27 mg/dL 0.93 0.78 0.77  Sodium 134 - 144 mmol/L 139 140 141  Potassium 3.5 - 5.2 mmol/L 4.5 4.8 4.6  Chloride 96 - 106 mmol/L 95(L) 98 104  CO2 20 - 29 mmol/L 29 26 28   Calcium 8.7 - 10.2 mg/dL 9.3 10.6(H) 9.5  Total Protein 6.0 - 8.5 g/dL 6.9 6.3 6.6  Total Bilirubin 0.0 - 1.2 mg/dL 0.4 0.4 0.4  Alkaline Phos 44 - 121 IU/L 94 82 -  AST 0 - 40 IU/L 42(H) 32 32  ALT 0 - 44 IU/L 32 33 31     Assessment & Plan:   1. Uncontrolled type 2 diabetes mellitus with diabetic peripheral angiopathy without gangrene, with long-term current use of insulin ([place])  He presents with above target glycemic profile.  Point-of-care A1c is 8% increasing from 7.8% during his last visit.  No hypoglycemia.  He is glycemic profile is consistent and mostly between 140-180 mg per DL.    He remains at  extremely high risk for more acute and chronic complications of diabetes which include CAD, CVA, CKD, retinopathy, and neuropathy. These are all discussed in detail with the patient.   Glucose logs and insulin administration records pertaining to this visit,  to be scanned into patient's records.  Recent labs reviewed.  - I have  re-counseled the patient on diet management and weight loss  by adopting a carbohydrate restricted / protein rich  Diet.  -He still admits to dietary indiscretion including consumption of and sweetened beverages.  - he acknowledges that there is a room for improvement in his food and drink choices. - Suggestion is made for him to avoid simple carbohydrates  from his diet including Cakes, Sweet Desserts, Ice Cream, Soda (diet and regular), Sweet Tea, Candies, Chips, Cookies, Store Bought Juices, Alcohol in Excess of  1-2 drinks a day, Artificial Sweeteners,  Coffee Creamer, and "Sugar-free" Products, Lemonade. This will help patient to have more stable blood glucose profile and potentially avoid unintended weight gain.   - Patient is advised to stick to a routine mealtimes to eat 3 meals  a day and avoid unnecessary snacks ( to snack only to correct hypoglycemia).   -He will continue to need multiple daily injections of high-dose insulin in order for him to achieve and maintain control of diabetes to target.   -Plant predominant Whole Foods lifestyle nutrition is discussed in detail and recommended to him.  He promises that he will do better on his snacking habits.  -For now, he is advised to increase Humulin U50 0 to 120 units with breakfast, 120 units with lunch, 110 units with supper  for pre-meal blood glucose readings of 90 or above milligrams per deciliter associated with strict monitoring of blood glucose 4 times a day-before meals and at bedtime.    -Patient is encouraged to call clinic for blood glucose levels less than 70 or above 300 mg /dl. -He is advised to  continue Metformin 1000 mg p.o. twice daily-with breakfast and supper.    -He is also advised to continue Bydureon 2 mg subcutaneously weekly.  He has benefited from glipizide intervention, advised to continue glipizide 5 mg XL p.o. daily at breakfast.  - He is a perfect candidate for bariatric surgery, he has been hesitant to consider this option. -He is following with Jearld Fenton, CDE for DM education.  - Patient specific target  for A1c; LDL, HDL, Triglycerides,  were discussed in detail.  2) BP/HTN: -His blood pressure is controlled to target.  Patient admits he has not taken his blood pressure medications this morning.    He is advised to proceed with his benazepril to 10 mg p.o. daily, also has verapamil 240 mg p.o. daily for blood pressure treatment.   3) Lipids/HPL: He has uncontrolled LDL, however improving to not 79 from 99 .  He is advised to continue pravastatin 40 mg p.o. nightly.        Side effects and precautions discussed with him.    4)  Weight/Diet: His BMI is 28.7--GOTLXBW complicating his diabetes care.  He is a candidate for major weight loss.   He has had no significant success in weight control.  He still admits to dietary indiscretion.   CDE consult in progress, exercise, and carbohydrates information provided.  He hesitates to consider bariatric surgery. Would benefit from a pair of diabetic shoes.  I have filled out paperwork for him to get a pair from his pharmacy.  5) Chronic Care/Health Maintenance:  -Patient  is  on ACEI/ARB and Statin medications and encouraged to continue to follow up with Ophthalmology, Podiatrist at least yearly or according to recommendations, and advised to  stay away from smoking. I have recommended yearly flu vaccine and pneumonia vaccination at least every 5 years; and  sleep for at least 7 hours  a day. -This patient cannot exercise optimally due to his heavy weight and comorbidities.  - I advised patient to maintain close follow  up with Leeanne Rio, MD for primary care needs.     I spent 41 minutes in the care of the patient today including review of labs from [place], Lipids, Thyroid Function, Hematology (current and previous including abstractions from other facilities); face-to-face time discussing  his blood glucose readings/logs, discussing hypoglycemia and hyperglycemia episodes and symptoms, medications doses, his options of short and long term treatment based on the latest standards of care / guidelines;  discussion about incorporating lifestyle medicine;  and documenting the encounter.    Please refer to Patient Instructions for Blood Glucose Monitoring and Insulin/Medications Dosing Guide"  in media tab for additional information. Please  also refer to " Patient Self Inventory" in the Media  tab for reviewed elements of pertinent patient history.  Aaron Potter participated in the discussions, expressed understanding, and voiced agreement with the above plans.  All questions were answered to his satisfaction. he is encouraged to contact clinic should he have any questions or concerns prior to his return visit.     Follow up plan: -Return in about 3 months (around 09/28/2020) for Bring Meter and Logs- A1c in Office.  Glade Lloyd, MD Phone: [phone]  Fax: [phone]  -  This note was partially dictated with voice recognition software. Similar sounding words can be transcribed inadequately or may not  be corrected upon review.  06/26/2020, 3:34 PM

## 2020-06-29 ENCOUNTER — Other Ambulatory Visit: Payer: Self-pay | Admitting: "Endocrinology

## 2020-06-30 ENCOUNTER — Other Ambulatory Visit: Payer: Self-pay | Admitting: "Endocrinology

## 2020-06-30 DIAGNOSIS — IMO0002 Reserved for concepts with insufficient information to code with codable children: Secondary | ICD-10-CM

## 2020-07-03 ENCOUNTER — Other Ambulatory Visit: Payer: Self-pay | Admitting: "Endocrinology

## 2020-07-22 ENCOUNTER — Other Ambulatory Visit: Payer: Self-pay | Admitting: "Endocrinology

## 2020-07-22 DIAGNOSIS — E1165 Type 2 diabetes mellitus with hyperglycemia: Secondary | ICD-10-CM

## 2020-07-22 DIAGNOSIS — IMO0002 Reserved for concepts with insufficient information to code with codable children: Secondary | ICD-10-CM

## 2020-07-30 ENCOUNTER — Other Ambulatory Visit: Payer: Self-pay | Admitting: "Endocrinology

## 2020-07-30 DIAGNOSIS — E1165 Type 2 diabetes mellitus with hyperglycemia: Secondary | ICD-10-CM

## 2020-07-30 DIAGNOSIS — IMO0002 Reserved for concepts with insufficient information to code with codable children: Secondary | ICD-10-CM

## 2020-07-30 DIAGNOSIS — E1151 Type 2 diabetes mellitus with diabetic peripheral angiopathy without gangrene: Secondary | ICD-10-CM

## 2020-08-11 NOTE — Progress Notes (Signed)
Date:  08/12/2020   ID:  [name], DOB 08/05/1960, MRN [id number]  PCP:  Leeanne Rio, MD  Cardiologist: Dr. Cathie Hoops [facility]) and [place], [place], [facility] (established care 08/12/2020)  REASON FOR CONSULT: Chest discomfort and dyspnea on exertion  REQUESTING PHYSICIAN:  Leeanne Rio, MD [place],  [place] [id number]  Chief Complaint  Patient presents with   Chest discomfort   dyspnea on exertion   New Patient (Initial Visit)    HPI  Aaron Potter is a 60 y.o. male who presents to the office with a chief complaint of " chest pain, shortness of breath, palpitations." Patient's past medical history and cardiovascular risk factors include: hypertension, Hypertension, hyperlipidemia, insulin-dependent diabetes mellitus type 2, OSA on BiPAP, family history of heart disease, morbid obesity (Body mass index is 63.88 kg/m.).  He is referred to the office at the request of Leeanne Rio, MD for evaluation of chest discomfort.  Patient is accompanied by his sister Aaron Potter at today's office visit.  He provides verbal consent in regards to discussing his medical information in her presence.  Chest pain: Patient states that he has been having precordial discomfort for the last 2 months, intermittently, located substernally, lasting for few seconds, dull-like sensation, last episode was approximately 1 week ago, no improving or worsening factors, at times brought on by effort related activities.  Over the last 1 week he has modified his activities so that he is not exerting himself.  Palpitations: Present also for the last 2 months, comes in waves as per patient, worse with overexertion, but improves with resting, lasting for few seconds, appears on a weekly basis.  He consumes 1 gallon of tea every 3 days does not recall if it is caffeinated or decaffeinated.  He does not consume coffee, soda, herbal supplements, over-the-counter medications that are new,  stimulant drugs, weight loss supplements, or energy drinks.  He has chronic dyspnea on exertion.  No exercise program or daily routine.  Patient states that he is currently disabled due to his weight and breathing difficulties.  He saw his primary cardiologist Dr. Candis Musa  in [place] and now referred to [facility] for further evaluation.  FUNCTIONAL STATUS: No family history of premature coronary disease or sudden cardiac death.    ALLERGIES: Allergies  Allergen Reactions   Biaxin [Clarithromycin] Anaphylaxis and Shortness Of Breath    MEDICATION LIST PRIOR TO VISIT: Current Meds  Medication Sig   ACCU-CHEK AVIVA PLUS test strip USE TO CHECK BLOOD SUGAR 4 TIMES DAILY.   Accu-Chek FastClix Lancets MISC USE TO CHECK BLOOD SUGAR FOUR TIMES A DAY.   albuterol (PROAIR HFA) 108 (90 Base) MCG/ACT inhaler Inhale 2 puffs into the lungs every 4 (four) hours as needed for wheezing or shortness of breath. Sometimes only uses one puff   albuterol (PROVENTIL) (2.5 MG/3ML) 0.083% nebulizer solution Take 3 mLs (2.5 mg total) by nebulization every 6 (six) hours as needed for wheezing or shortness of breath.   allopurinol (ZYLOPRIM) 300 MG tablet Take 300 mg by mouth daily.     ALPRAZolam (XANAX) 0.5 MG tablet Take 1 tablet by mouth 2 (two) times daily as needed.   Blood Glucose Monitoring Suppl (ACCU-CHEK GUIDE) w/Device KIT 1 Piece by Does not apply route as directed.   BYDUREON BCISE 2 MG/0.85ML AUIJ INJECT INTO THE SKIN ONCE A WEEK.   cloNIDine (CATAPRES) 0.1 MG tablet TAKE (1) TABLET TWICE DAILY.   diclofenac Sodium (VOLTAREN) 1 %  GEL Apply topically 4 (four) times daily.   furosemide (LASIX) 40 MG tablet Take 1 tablet (40 mg total) by mouth daily. (Patient taking differently: Take 80 mg by mouth daily.)   glipiZIDE (GLUCOTROL XL) 5 MG 24 hr tablet TAKE 1 TABLET ONCE DAILY WITH BREAKFAST.   GLOBAL EASE INJECT PEN NEEDLES 31G X 8 MM MISC USE 4 TIMES DAILY.   indomethacin (INDOCIN) 50 MG capsule Take  50 mg by mouth daily as needed.   insulin regular human CONCENTRATED (HUMULIN R U-500 KWIKPEN) 500 UNIT/ML kwikpen INJECT 120 UNITS WITH BREAKFAST AND LUNCH, 110 UNITS WITH SUPPER IF GLUCOSE ABOVE 90.   metFORMIN (GLUCOPHAGE) 500 MG tablet TAKE 2 TABLETS BY MOUTH TWICE DAILY.   Multiple Vitamin (MULTIVITAMIN WITH MINERALS) TABS tablet Take 1 tablet by mouth daily.   olmesartan (BENICAR) 20 MG tablet Take 1 tablet (20 mg total) by mouth daily.   oxyCODONE-acetaminophen (PERCOCET) 7.5-325 MG tablet Take 1 tablet by mouth every 6 (six) hours as needed for severe pain.   pantoprazole (PROTONIX) 40 MG tablet TAKE 1 TABLET DAILY, 30 MINUTES BEFORE BREAKFAST `   Potassium Chloride ER 20 MEQ TBCR Take 20 mEq by mouth daily.   pravastatin (PRAVACHOL) 40 MG tablet Take 40 mg by mouth daily.   verapamil (CALAN-SR) 240 MG CR tablet Take 240 mg by mouth daily.   Vitamin D, Ergocalciferol, (DRISDOL) 1.25 MG (50000 UNIT) CAPS capsule Take 1 capsule (50,000 Units total) by mouth once a week.     PAST MEDICAL HISTORY: Past Medical History:  Diagnosis Date   Allergic rhinitis, unspecified    Allergy    Anxiety disorder, unspecified    Asthma    Back pain    Balanitis    Cellulitis    COPD (chronic obstructive pulmonary disease) (HCC)    Diabetes mellitus without complication ([place])    Essential (primary) hypertension    Gastro-esophageal reflux disease with esophagitis    Gout    Gout, unspecified    Hypertension    Lichen sclerosus    Mixed hyperlipidemia    Morbid (severe) obesity with alveolar hypoventilation (HCC)    Obesity, morbid (more than 100 lbs over ideal weight or BMI > 40) (HCC)    Obesity, unspecified    Polyosteoarthritis, unspecified    Sleep apnea    Sleep apnea, unspecified    CPAP   Type 2 diabetes mellitus with hyperglycemia (HCC)    Urticaria    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  SOCIAL HISTORY:  The patient  reports that he has never smoked. He has never used smokeless tobacco. He reports that he does not drink alcohol and does not use drugs.  REVIEW OF SYSTEMS: Review of Systems  Constitutional: Positive for weight gain (40 pounds since pandemic). Negative for chills and fever.  HENT:  Negative for hoarse voice and nosebleeds.   Eyes:  Negative for discharge, double vision and pain.  Cardiovascular:  Positive for chest pain, dyspnea on exertion and leg swelling. Negative for claudication, near-syncope, orthopnea, palpitations, paroxysmal nocturnal dyspnea and syncope.  Respiratory:  Positive for shortness of breath.  Negative for hemoptysis.   Musculoskeletal:  Negative for muscle cramps and myalgias.  Gastrointestinal:  Negative for abdominal pain, constipation, diarrhea, hematemesis, hematochezia, melena, nausea and vomiting.  Neurological:  Negative for dizziness and light-headedness.   PHYSICAL EXAM: Vitals with BMI 08/12/2020 06/26/2020 03/25/2020  Height _0  _1  _2   Weight 458 lbs 456 lbs 454 lbs 10 oz  BMI 63.91 94.49 67.59  Systolic 163 846 659  Diastolic 58 58 64  Pulse 68 72 72    CONSTITUTIONAL: Appears older than stated age, breathing heavily, hemodynamically stable, no acute distress. SKIN: Skin is warm and dry. No rash noted. No cyanosis. No pallor. No jaundice HEAD: Normocephalic and atraumatic.  EYES: No scleral icterus MOUTH/THROAT: Moist oral membranes.  NECK: Unable to evaluate JVP due to short neck stature and significant adipose tissue, do not appreciate carotid bruits. LYMPHATIC: No visible cervical adenopathy.  CHEST Normal respiratory effort. No intercostal  retractions  LUNGS: Decreased breath sounds bilaterally.  No stridor. No wheezes. No rales.  CARDIOVASCULAR: Distant heart sound, faint S1-S2, no murmurs rubs or gallops appreciated. ABDOMINAL: Morbidly obese, soft, nontender, nondistended, positive bowel sounds in all 4 quadrants, no apparent ascites.  EXTREMITIES: +1 bilateral pitting edema, decreased DP and PT pulses. HEMATOLOGIC: No significant bruising NEUROLOGIC: Oriented to person, place, and time. Nonfocal. Normal muscle tone.  PSYCHIATRIC: Normal mood and affect. Normal behavior. Cooperative  CARDIAC DATABASE: EKG: 08/12/2020: Normal sinus rhythm, 70 bpm, left axis deviation, poor R wave progression, without underlying ischemia or injury pattern.   Echocardiogram: 05/01/20 (Care Everywhere OV 08/04/2020) Summary 1. Technically difficult study. 2. The left ventricle is not well visualized but probably normal in size with mildly increased wall thickness. 3. Overall left ventricular systolic function appears normal. 4. The left atrium is moderately dilated in size. 5. The right ventricle is mildly dilated in size. 6. The right atrium is mildly dilated in size. 7. Gradient and calculations are suggestive of some degree of aortic stenosis (possibly moderate).  Stress Testing: No results found for this or any previous visit from the past 1095 days.  Heart Catheterization: None  LABORATORY DATA: CBC Latest Ref Rng & Units 04/01/2019 04/12/2013 04/07/2013  WBC 3.8 - 10.8 Thousand/uL 9.0 9.3 7.4  Hemoglobin 13.2 - 17.1 g/dL 14.8 13.0 14.1  Hematocrit 38.5 - 50.0 % 45.6 39.2 42.7  Platelets 140 - 400 Thousand/uL 316 287 289    CMP Latest Ref Rng & Units 06/22/2020 12/19/2019 04/01/2019  Glucose 65 - 99 mg/dL 215(H) 167(H) 215(H)  BUN 6 - 24 mg/dL _3 Creatinine 0.76 - 1.27 mg/dL 0.93 0.78 0.77  Sodium 134 - 144 mmol/L 139 140 141  Potassium 3.5 - 5.2 mmol/L 4.5 4.8 4.6  Chloride 96 - 106 mmol/L 95(L) 98 104  CO2 20 - 29  mmol/L _4 Calcium 8.7 - 10.2 mg/dL 9.3 10.6(H) 9.5  Total Protein 6.0 - 8.5 g/dL 6.9 6.3 6.6  Total Bilirubin 0.0 - 1.2 mg/dL 0.4 0.4 0.4  Alkaline Phos 44 - 121 IU/L 94 82 -  AST 0 - 40 IU/L 42(H) 32 32  ALT 0 - 44 IU/L 32 33 31    Lipid Panel     Component Value Date/Time   CHOL 137 04/01/2019 0843   TRIG 121 04/01/2019 0843   HDL 36 (L) 04/01/2019 0843   CHOLHDL 3.8 04/01/2019 0843   VLDL 31 (H) 03/12/2015 1200   LDLCALC 79 04/01/2019 0843    No components found  for: NTPROBNP No results for input(s): PROBNP in the last 8760 hours. No results for input(s): TSH in the last 8760 hours.  BMP Recent Labs    12/19/19 0803 06/22/20 0903  NA 140 139  K 4.8 4.5  CL 98 95*  CO2 26 29  GLUCOSE 167* 215*  BUN 12 11  CREATININE 0.78 0.93  CALCIUM 10.6* 9.3  GFRNONAA 100  --   GFRAA 116  --     HEMOGLOBIN A1C Lab Results  Component Value Date   HGBA1C 8.0 (A) 06/26/2020   MPG 197 10/10/2018    External Labs:  Date Collected: 04/20/2020 , information obtained by care everywhere Potassium: 4.9 Creatinine 1.16 mg/dL. eGFR: 70 mL/min per 1.73 m Lipid profile: Total cholesterol 144 , triglycerides 114 , HDL 39 , LDL 82 AST: 34 , ALT: 39 , alkaline phosphatase: 93   Date Collected: 03/25/2020 , information obtained by Care Everywhere Hemoglobin A1c: 7.8  Date Collected: 05/05/2020 , information obtained by Care Everywhere Hemoglobin: 14.7 g/dL and hematocrit: 44.4 %  IMPRESSION:    ICD-10-CM   1. Dyspnea on exertion  R06.00 EKG [id number]    Basic metabolic panel    Magnesium    Pro b natriuretic peptide (BNP)    olmesartan (BENICAR) 20 MG tablet    2. Precordial pain  R07.2     3. Benign hypertension  I10 olmesartan (BENICAR) 20 MG tablet    4. Mixed hyperlipidemia  E78.2     5. Type 2 diabetes mellitus without complication, with long-term current use of insulin (HCC)  E11.9    Z79.4     6. OSA treated with BiPAP  G47.33     7. Family history of  heart disease  Z82.49        RECOMMENDATIONS: Justen Fonda is a 60 y.o. male whose past medical history and cardiac risk factors include: Hypertension, hyperlipidemia, insulin-dependent diabetes mellitus type 2, OSA on BiPAP, family history of heart disease, morbid obesity (Body mass index is 63.88 kg/m.).  Dyspnea on exertion: Multifactorial: EKG is normal sinus without underlying ischemia. He had an echocardiogram recently at an outside facility records reviewed from care everywhere the study was suboptimal due to body habitus. We discussed undergoing ischemic evaluation.  However, patient's body habitus is the rate limiting factor.  SPECT camera weight limit is approximately 400 pounds, CT and cath table limits are 450 pounds.  This was conveyed to the patient and therefore would recommend improving his modifiable cardiovascular risk factors, medication titration, and revisiting ischemic evaluation in the near future.  He is agreeable with the plan of care and understands. Optimize antihypertensive medications. Patient is also encouraged to follow-up with pulmonary medicine he will discuss it further with PCP.  Benign essential hypertension: Wean clonidine over the next 3 weeks.  Instructions provided to the patient. Start olmesartan 20 mg p.o. daily Check blood work today for baseline and prior to the next office visit. Low-salt diet recommended Medications reconciled.  Palpitations: Check BMP, magnesium, TSH levels. Once off of clonidine will consider extended Holter monitor to evaluate for dysrhythmias. Monitor for now.  Mixed hyperlipidemia: Currently on statin therapy.  Insulin-dependent diabetes mellitus type 2: Educated on the importance of glycemic control.  Sees endocrinology.   Obesity, due to excess calories: Body mass index is 63.88 kg/m. I reviewed with the patient the importance of diet, regular physical activity/exercise, weight loss.   Patient is educated on  increasing physical activity gradually as tolerated.  With the goal  of moderate intensity exercise for 30 minutes a day 5 days a week.   FINAL MEDICATION LIST END OF ENCOUNTER: Meds ordered this encounter  Medications   olmesartan (BENICAR) 20 MG tablet    Sig: Take 1 tablet (20 mg total) by mouth daily.    Dispense:  30 tablet    Refill:  0    Medications Discontinued During This Encounter  Medication Reason   diphenhydrAMINE (BENADRYL) 25 mg capsule Error     Current Outpatient Medications:    ACCU-CHEK AVIVA PLUS test strip, USE TO CHECK BLOOD SUGAR 4 TIMES DAILY., Disp: 100 strip, Rfl: 2   Accu-Chek FastClix Lancets MISC, USE TO CHECK BLOOD SUGAR FOUR TIMES A DAY., Disp: 102 each, Rfl: 2   albuterol (PROAIR HFA) 108 (90 Base) MCG/ACT inhaler, Inhale 2 puffs into the lungs every 4 (four) hours as needed for wheezing or shortness of breath. Sometimes only uses one puff, Disp: 18 g, Rfl: 2   albuterol (PROVENTIL) (2.5 MG/3ML) 0.083% nebulizer solution, Take 3 mLs (2.5 mg total) by nebulization every 6 (six) hours as needed for wheezing or shortness of breath., Disp: 150 mL, Rfl: 1   allopurinol (ZYLOPRIM) 300 MG tablet, Take 300 mg by mouth daily.  , Disp: , Rfl:    ALPRAZolam (XANAX) 0.5 MG tablet, Take 1 tablet by mouth 2 (two) times daily as needed., Disp: , Rfl:    Blood Glucose Monitoring Suppl (ACCU-CHEK GUIDE) w/Device KIT, 1 Piece by Does not apply route as directed., Disp: 1 kit, Rfl: 0   BYDUREON BCISE 2 MG/0.85ML AUIJ, INJECT INTO THE SKIN ONCE A WEEK., Disp: 3.4 mL, Rfl: 2   cloNIDine (CATAPRES) 0.1 MG tablet, TAKE (1) TABLET TWICE DAILY., Disp: 60 tablet, Rfl: 0   diclofenac Sodium (VOLTAREN) 1 % GEL, Apply topically 4 (four) times daily., Disp: , Rfl:    furosemide (LASIX) 40 MG tablet, Take 1 tablet (40 mg total) by mouth daily. (Patient taking differently: Take 80 mg by mouth daily.), Disp: 30 tablet, Rfl: 0   glipiZIDE (GLUCOTROL XL) 5 MG 24 hr tablet, TAKE 1 TABLET ONCE  DAILY WITH BREAKFAST., Disp: 30 tablet, Rfl: 2   GLOBAL EASE INJECT PEN NEEDLES 31G X 8 MM MISC, USE 4 TIMES DAILY., Disp: 100 each, Rfl: 3   indomethacin (INDOCIN) 50 MG capsule, Take 50 mg by mouth daily as needed., Disp: , Rfl:    insulin regular human CONCENTRATED (HUMULIN R U-500 KWIKPEN) 500 UNIT/ML kwikpen, INJECT 120 UNITS WITH BREAKFAST AND LUNCH, 110 UNITS WITH SUPPER IF GLUCOSE ABOVE 90., Disp: 63 mL, Rfl: 0   metFORMIN (GLUCOPHAGE) 500 MG tablet, TAKE 2 TABLETS BY MOUTH TWICE DAILY., Disp: 360 tablet, Rfl: 0   Multiple Vitamin (MULTIVITAMIN WITH MINERALS) TABS tablet, Take 1 tablet by mouth daily., Disp: , Rfl:    olmesartan (BENICAR) 20 MG tablet, Take 1 tablet (20 mg total) by mouth daily., Disp: 30 tablet, Rfl: 0   oxyCODONE-acetaminophen (PERCOCET) 7.5-325 MG tablet, Take 1 tablet by mouth every 6 (six) hours as needed for severe pain., Disp: , Rfl:    pantoprazole (PROTONIX) 40 MG tablet, TAKE 1 TABLET DAILY, 30 MINUTES BEFORE BREAKFAST `, Disp: 30 tablet, Rfl: 3   Potassium Chloride ER 20 MEQ TBCR, Take 20 mEq by mouth daily., Disp: , Rfl:    pravastatin (PRAVACHOL) 40 MG tablet, Take 40 mg by mouth daily., Disp: , Rfl:    verapamil (CALAN-SR) 240 MG CR tablet, Take 240 mg by mouth daily., Disp: ,  Rfl:    Vitamin D, Ergocalciferol, (DRISDOL) 1.25 MG (50000 UNIT) CAPS capsule, Take 1 capsule (50,000 Units total) by mouth once a week., Disp: 4 capsule, Rfl: 3  Orders Placed This Encounter  Procedures   Basic metabolic panel   Magnesium   Pro b natriuretic peptide (BNP)   EKG 12-Lead    Patient Instructions  Week 1 : Clonidine 0.1 mg p.o. daily Week 2:  Clonidine 0.1 mg p.o. q. other day Week 3:  Stop clonidine   --Continue cardiac medications as reconciled in final medication list. --Return in about 4 weeks (around 09/09/2020) for Follow up, Chest pain, Dyspnea, labs before. Or sooner if needed. --Continue follow-up with your primary care physician regarding the management  of your other chronic comorbid conditions.  Patient's questions and concerns were addressed to his satisfaction. He voices understanding of the instructions provided during this encounter.   This note was created using a voice recognition software as a result there may be grammatical errors inadvertently enclosed that do not reflect the nature of this encounter. Every attempt is made to correct such errors.  Rex Kras, [place], [facility]  Pager: [phone] Office: [phone]

## 2020-08-12 ENCOUNTER — Other Ambulatory Visit: Payer: Self-pay

## 2020-08-12 ENCOUNTER — Encounter: Payer: Self-pay | Admitting: Cardiology

## 2020-08-12 ENCOUNTER — Ambulatory Visit: Payer: Medicaid Other | Admitting: Cardiology

## 2020-08-12 VITALS — BP 154/58 | HR 68 | Resp 16 | Ht 71.0 in | Wt >= 6400 oz

## 2020-08-12 DIAGNOSIS — I10 Essential (primary) hypertension: Secondary | ICD-10-CM

## 2020-08-12 DIAGNOSIS — R002 Palpitations: Secondary | ICD-10-CM

## 2020-08-12 DIAGNOSIS — R0600 Dyspnea, unspecified: Secondary | ICD-10-CM

## 2020-08-12 DIAGNOSIS — E119 Type 2 diabetes mellitus without complications: Secondary | ICD-10-CM

## 2020-08-12 DIAGNOSIS — R072 Precordial pain: Secondary | ICD-10-CM

## 2020-08-12 DIAGNOSIS — Z8249 Family history of ischemic heart disease and other diseases of the circulatory system: Secondary | ICD-10-CM

## 2020-08-12 DIAGNOSIS — E782 Mixed hyperlipidemia: Secondary | ICD-10-CM

## 2020-08-12 DIAGNOSIS — G4733 Obstructive sleep apnea (adult) (pediatric): Secondary | ICD-10-CM

## 2020-08-12 DIAGNOSIS — R0609 Other forms of dyspnea: Secondary | ICD-10-CM

## 2020-08-12 MED ORDER — OLMESARTAN MEDOXOMIL 20 MG PO TABS: 20.0000 mg | ORAL_TABLET | Freq: Every day | ORAL | 0 refills | Status: DC

## 2020-08-12 NOTE — Patient Instructions (Signed)
Week 1 : Clonidine 0.1 mg p.o. daily Week 2:  Clonidine 0.1 mg p.o. q. other day Week 3:  Stop clonidine

## 2020-08-13 LAB — PRO B NATRIURETIC PEPTIDE: NT-Pro BNP: 122 pg/mL (ref 0–210)

## 2020-08-13 LAB — BASIC METABOLIC PANEL
BUN/Creatinine Ratio: 18 (ref 9–20)
BUN: 14 mg/dL (ref 6–24)
CO2: 27 mmol/L (ref 20–29)
Calcium: 10.1 mg/dL (ref 8.7–10.2)
Chloride: 96 mmol/L (ref 96–106)
Creatinine, Ser: 0.78 mg/dL (ref 0.76–1.27)
Glucose: 158 mg/dL — ABNORMAL HIGH (ref 65–99)
Potassium: 5 mmol/L (ref 3.5–5.2)
Sodium: 139 mmol/L (ref 134–144)
eGFR: 104 mL/min/{1.73_m2} (ref >59)

## 2020-08-13 LAB — MAGNESIUM: Magnesium: 1.9 mg/dL (ref 1.6–2.3)

## 2020-08-17 ENCOUNTER — Other Ambulatory Visit: Payer: Self-pay | Admitting: "Endocrinology

## 2020-08-17 DIAGNOSIS — E1165 Type 2 diabetes mellitus with hyperglycemia: Secondary | ICD-10-CM

## 2020-08-17 DIAGNOSIS — IMO0002 Reserved for concepts with insufficient information to code with codable children: Secondary | ICD-10-CM

## 2020-08-19 ENCOUNTER — Other Ambulatory Visit: Payer: Self-pay

## 2020-08-19 DIAGNOSIS — R0609 Other forms of dyspnea: Secondary | ICD-10-CM

## 2020-08-19 DIAGNOSIS — R0600 Dyspnea, unspecified: Secondary | ICD-10-CM

## 2020-08-19 NOTE — Progress Notes (Signed)
Pt aware and orders placed 

## 2020-09-09 LAB — BASIC METABOLIC PANEL
BUN/Creatinine Ratio: 14 (ref 9–20)
BUN: 12 mg/dL (ref 6–24)
CO2: 28 mmol/L (ref 20–29)
Calcium: 9.7 mg/dL (ref 8.7–10.2)
Chloride: 99 mmol/L (ref 96–106)
Creatinine, Ser: 0.83 mg/dL (ref 0.76–1.27)
Glucose: 237 mg/dL — ABNORMAL HIGH (ref 65–99)
Potassium: 5 mmol/L (ref 3.5–5.2)
Sodium: 141 mmol/L (ref 134–144)
eGFR: 102 mL/min/{1.73_m2} (ref >59)

## 2020-09-09 LAB — TSH: TSH: 3.27 u[IU]/mL (ref 0.450–4.500)

## 2020-09-09 LAB — MAGNESIUM: Magnesium: 2 mg/dL (ref 1.6–2.3)

## 2020-09-09 NOTE — Progress Notes (Signed)
Called and spoke to pt, pt voiced understanding.

## 2020-09-14 ENCOUNTER — Encounter: Payer: Self-pay | Admitting: Cardiology

## 2020-09-14 ENCOUNTER — Other Ambulatory Visit: Payer: Self-pay

## 2020-09-14 ENCOUNTER — Ambulatory Visit: Payer: Medicaid Other | Admitting: Cardiology

## 2020-09-14 VITALS — BP 135/78 | HR 71 | Temp 97.8°F | Resp 16 | Ht 71.0 in | Wt >= 6400 oz

## 2020-09-14 DIAGNOSIS — E782 Mixed hyperlipidemia: Secondary | ICD-10-CM

## 2020-09-14 DIAGNOSIS — G4733 Obstructive sleep apnea (adult) (pediatric): Secondary | ICD-10-CM

## 2020-09-14 DIAGNOSIS — Z8249 Family history of ischemic heart disease and other diseases of the circulatory system: Secondary | ICD-10-CM

## 2020-09-14 DIAGNOSIS — Z794 Long term (current) use of insulin: Secondary | ICD-10-CM

## 2020-09-14 DIAGNOSIS — R0609 Other forms of dyspnea: Secondary | ICD-10-CM

## 2020-09-14 DIAGNOSIS — R0600 Dyspnea, unspecified: Secondary | ICD-10-CM

## 2020-09-14 DIAGNOSIS — E119 Type 2 diabetes mellitus without complications: Secondary | ICD-10-CM

## 2020-09-14 DIAGNOSIS — I10 Essential (primary) hypertension: Secondary | ICD-10-CM

## 2020-09-14 MED ORDER — OLMESARTAN MEDOXOMIL 20 MG PO TABS: 20.0000 mg | ORAL_TABLET | Freq: Every day | ORAL | 0 refills | Status: DC

## 2020-09-14 NOTE — Progress Notes (Signed)
Date:  09/14/2020   ID:  Aaron Potter, DOB Mar 25, 1960, MRN [id number]  PCP:  Aaron Rio, MD  Cardiologist: Dr. Cathie Potter [facility]) and [place], [place], [facility] (established care 08/12/2020)  Date: 09/14/20 Last Office Visit: 08/12/2020   Chief Complaint  Patient presents with   Shortness of Breath   Follow-up   Results    HPI  Aaron Potter is a 60 y.o. male who presents to the office with a chief complaint of " reevaluation of shortness of breath, palpitations." Patient's past medical history and cardiovascular risk factors include: hypertension, Hypertension, hyperlipidemia, insulin-dependent diabetes mellitus type 2, OSA on BiPAP, family history of heart disease, morbid obesity (Body mass index is 61.23 kg/m.).  He is referred to the office at the request of Aaron Rio, MD for evaluation of chest discomfort.  Patient is accompanied by his sister Aaron Potter at today's office visit.  He provides verbal consent in regards to discussing his medical information in her presence. He saw his primary cardiologist Dr. Candis Potter  in [place] and now referred to [facility] for further evaluation.  At the prior office visit patient had symptoms of chest pain and shortness of breath which were at times brought on by effort related activities.  Follows up with Dr. Candis Potter and was referred to [facility] for further cardiovascular imaging to evaluate for CAD.  At the last office visit patient was educated that due to his body habitus and BMI we were unable to schedule additional cardiovascular testing.  The SPECT camera weight limit is approximately 400 pounds in CT and cath table limits are 450 pounds.  Patient verbalized understanding.  At that time the shared decision was to improve his other modifiable cardiovascular risk factors and to reevaluate him.  At the last office visit he was recommended to wean off of clonidine over several weeks and also start olmesartan.  Patient states that  after coming off of clonidine his palpitations have essentially resolved.  He has increased his ability to be more physically active and has been watching his caloric intake.  Based on the records available he has lost approximately 19 pounds since last office visit for which she is congratulated for.  He denies any chest pain at rest or with effort related activities.  His shortness of breath with effort related activities remains relatively stable.  FUNCTIONAL STATUS: No family history of premature coronary disease or sudden cardiac death.    ALLERGIES: Allergies  Allergen Reactions   Biaxin [Clarithromycin] Anaphylaxis and Shortness Of Breath   Ace Inhibitors     D/c ACEi 05/20/2019 > changed to clonidine for pain control component   Although even in retrospect it may not be clear the ACEi contributed to the pt's symptoms, adding them back at this point or in the future would risk confusion in interpretation of non-specific respiratory symptoms to which this patient is prone ie Better not to muddy the waters here.      MEDICATION LIST PRIOR TO VISIT: Current Meds  Medication Sig   ACCU-CHEK AVIVA PLUS test strip USE TO CHECK BLOOD SUGAR 4 TIMES DAILY.   Accu-Chek FastClix Lancets MISC USE TO CHECK BLOOD SUGAR FOUR TIMES A DAY.   albuterol (PROAIR HFA) 108 (90 Base) MCG/ACT inhaler Inhale 2 puffs into the lungs every 4 (four) hours as needed for wheezing or shortness of breath. Sometimes only uses one puff   albuterol (PROVENTIL) (2.5 MG/3ML) 0.083% nebulizer solution Take 3 mLs (2.5 mg total) by nebulization  every 6 (six) hours as needed for wheezing or shortness of breath.   allopurinol (ZYLOPRIM) 300 MG tablet Take 300 mg by mouth daily.     ALPRAZolam (XANAX) 0.5 MG tablet Take 1 tablet by mouth 2 (two) times daily as needed.   Blood Glucose Monitoring Suppl (ACCU-CHEK GUIDE) w/Device KIT 1 Piece by Does not apply route as directed.   BYDUREON BCISE 2 MG/0.85ML AUIJ INJECT INTO THE  SKIN ONCE A WEEK.   diclofenac Sodium (VOLTAREN) 1 % GEL Apply topically 4 (four) times daily.   furosemide (LASIX) 40 MG tablet Take 1 tablet (40 mg total) by mouth daily. (Patient taking differently: Take 40 mg by mouth 2 (two) times daily.)   glipiZIDE (GLUCOTROL XL) 5 MG 24 hr tablet TAKE 1 TABLET ONCE DAILY WITH BREAKFAST.   GLOBAL EASE INJECT PEN NEEDLES 31G X 8 MM MISC USE 4 TIMES DAILY.   HUMULIN R U-500 KWIKPEN 500 UNIT/ML KwikPen INJECT 120 UNITS WITH BREAKFAST AND LUNCH, 110 UNITS WITH SUPPER IF GLUCOSE ABOVE 90.   indomethacin (INDOCIN) 50 MG capsule Take 50 mg by mouth daily as needed.   metFORMIN (GLUCOPHAGE) 500 MG tablet TAKE 2 TABLETS BY MOUTH TWICE DAILY.   Multiple Vitamin (MULTIVITAMIN WITH MINERALS) TABS tablet Take 1 tablet by mouth daily.   oxyCODONE-acetaminophen (PERCOCET) 7.5-325 MG tablet Take 1 tablet by mouth every 6 (six) hours as needed for severe pain.   pantoprazole (PROTONIX) 40 MG tablet TAKE 1 TABLET DAILY, 30 MINUTES BEFORE BREAKFAST `   Potassium Chloride ER 20 MEQ TBCR Take 20 mEq by mouth daily.   pravastatin (PRAVACHOL) 40 MG tablet Take 40 mg by mouth daily.   verapamil (CALAN-SR) 240 MG CR tablet Take 240 mg by mouth daily.   Vitamin D, Ergocalciferol, (DRISDOL) 1.25 MG (50000 UNIT) CAPS capsule Take 1 capsule (50,000 Units total) by mouth once a week.   [DISCONTINUED] cloNIDine (CATAPRES) 0.1 MG tablet TAKE (1) TABLET TWICE DAILY.     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)    Obesity, morbid (more than 100 lbs over ideal weight or BMI > 40) (HCC)    Obesity, unspecified    Polyosteoarthritis, unspecified    Sleep apnea    Sleep apnea, unspecified    CPAP   Type 2 diabetes mellitus with hyperglycemia (HCC)    Urticaria    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  SOCIAL HISTORY:  The patient  reports that he has never smoked. He has never used smokeless tobacco. He reports that he does not  drink alcohol and does not use drugs.  REVIEW OF SYSTEMS: Review of Systems  Constitutional: Positive for weight gain (40 pounds since pandemic). Negative for chills and fever.  HENT:  Negative for hoarse voice and nosebleeds.   Eyes:  Negative for discharge, double vision and pain.  Cardiovascular:  Positive for dyspnea on exertion and leg swelling. Negative for chest pain, claudication, near-syncope, orthopnea, palpitations, paroxysmal nocturnal dyspnea and syncope.  Respiratory:  Positive for shortness of breath. Negative for hemoptysis.   Musculoskeletal:  Negative for muscle cramps and myalgias.  Gastrointestinal:  Negative for abdominal pain, constipation, diarrhea, hematemesis, hematochezia, melena, nausea and vomiting.  Neurological:  Negative for dizziness and light-headedness.   PHYSICAL EXAM: Vitals with BMI 09/14/2020 09/14/2020 08/12/2020  Height - 5' 11"  5' 11"   Weight - 439 lbs 458 lbs  BMI - 28.78 67.67  Systolic 209 470 962  Diastolic 78 59 58  Pulse 71 74 68    CONSTITUTIONAL: Appears older than stated age, breathing heavily, hemodynamically stable, no acute distress. SKIN:  Skin is warm and dry. No rash noted. No cyanosis. No pallor. No jaundice HEAD: Normocephalic and atraumatic.  EYES: No scleral icterus MOUTH/THROAT: Moist oral membranes.  NECK: Unable to evaluate JVP due to short neck stature and significant adipose tissue, do not appreciate carotid bruits. LYMPHATIC: No visible cervical adenopathy.  CHEST Normal respiratory effort. No intercostal retractions  LUNGS: Decreased breath sounds bilaterally.  No stridor. No wheezes. No rales.  CARDIOVASCULAR: Distant heart sound, faint S1-S2, no murmurs rubs or gallops appreciated. ABDOMINAL: Morbidly obese, soft, nontender, nondistended, positive bowel sounds in all 4 quadrants. EXTREMITIES: +1 bilateral pitting edema, decreased DP and PT pulses. HEMATOLOGIC: No significant bruising NEUROLOGIC: Oriented to person, place, and time. Nonfocal. Normal muscle tone.  PSYCHIATRIC: Normal mood and affect. Normal behavior. Cooperative  No significant change in physical examination since last office encounter  CARDIAC DATABASE: EKG: 08/12/2020: Normal sinus rhythm, 70 bpm, left axis deviation, poor R wave progression, without underlying ischemia or injury pattern.   Echocardiogram: 05/01/20 (Care Everywhere OV 08/04/2020) Summary 1. Technically difficult study. 2. The left ventricle is not well visualized but probably normal in size with mildly increased wall thickness. 3. Overall left ventricular systolic function appears normal. 4. The left atrium is moderately dilated in size. 5. The right ventricle is mildly dilated in size. 6. The right atrium is mildly dilated in size. 7. Gradient and calculations are suggestive of some degree of aortic stenosis (possibly moderate).  Stress Testing: No results found for this or any previous visit from the past 1095 days.  Heart Catheterization: None  LABORATORY DATA: CBC Latest Ref Rng & Units 04/01/2019 04/12/2013 04/07/2013  WBC 3.8 - 10.8 Thousand/uL 9.0 9.3 7.4   Hemoglobin 13.2 - 17.1 g/dL 14.8 13.0 14.1  Hematocrit 38.5 - 50.0 % 45.6 39.2 42.7  Platelets 140 - 400 Thousand/uL 316 287 289    CMP Latest Ref Rng & Units 09/08/2020 08/12/2020 06/22/2020  Glucose 65 - 99 mg/dL 237(H) 158(H) 215(H)  BUN 6 - 24 mg/dL 12 14 11   Creatinine 0.76 - 1.27 mg/dL 0.83 0.78 0.93  Sodium 134 - 144 mmol/L 141 139 139  Potassium 3.5 - 5.2 mmol/L 5.0 5.0 4.5  Chloride 96 - 106 mmol/L 99 96 95(L)  CO2 20 - 29 mmol/L 28 27 29   Calcium 8.7 - 10.2 mg/dL 9.7 10.1 9.3  Total Protein 6.0 - 8.5 g/dL - - 6.9  Total Bilirubin 0.0 - 1.2 mg/dL - - 0.4  Alkaline Phos 44 - 121 IU/L - - 94  AST 0 - 40 IU/L - - 42(H)  ALT 0 - 44 IU/L - - 32    Lipid Panel     Component Value Date/Time   CHOL 137 04/01/2019 0843   TRIG 121 04/01/2019 0843   HDL 36 (L) 04/01/2019 0843   CHOLHDL 3.8 04/01/2019 0843   VLDL 31 (H) 03/12/2015 1200   LDLCALC 79 04/01/2019 0843    No components found for: NTPROBNP Recent Labs    08/12/20 1036  PROBNP 122   Recent Labs    09/08/20 0930  TSH 3.270    BMP Recent Labs    12/19/19 0803 06/22/20 0903 08/12/20 1036 09/08/20 0929  NA 140 139 139 141  K 4.8 4.5 5.0 5.0  CL 98 95* 96 99  CO2 26 29 27 28   GLUCOSE 167* 215* 158* 237*  BUN 12 11 14 12   CREATININE 0.78 0.93 0.78 0.83  CALCIUM 10.6* 9.3 10.1 9.7  GFRNONAA 100  --   --   --   GFRAA 116  --   --   --     HEMOGLOBIN A1C Lab Results  Component Value Date   HGBA1C 8.0 (A) 06/26/2020   MPG 197 10/10/2018    External Labs:  Date Collected: 04/20/2020 , information obtained by care everywhere Potassium: 4.9 Creatinine 1.16 mg/dL. eGFR: 70 mL/min per 1.73 m Lipid profile: Total cholesterol 144 , triglycerides 114 , HDL 39 , LDL 82 AST: 34 , ALT: 39 , alkaline phosphatase: 93   Date Collected: 03/25/2020 , information obtained by Care Everywhere Hemoglobin A1c: 7.8  Date Collected: 05/05/2020 , information obtained by Care Everywhere Hemoglobin: 14.7 g/dL and  hematocrit: 44.4 %  IMPRESSION:    ICD-10-CM   1. Dyspnea on exertion  R06.00 olmesartan (BENICAR) 20 MG tablet    2. Benign hypertension  I10 olmesartan (BENICAR) 20 MG tablet    3. Mixed hyperlipidemia  E78.2     4. Type 2 diabetes mellitus without complication, with long-term current use of insulin (HCC)  E11.9    Z79.4     5. OSA treated with BiPAP  G47.33     6. Family history of heart disease  Z82.49        RECOMMENDATIONS: Timithy Arons is a 60 y.o. male whose past medical history and cardiac risk factors include: Hypertension, hyperlipidemia, insulin-dependent diabetes mellitus type 2, OSA on BiPAP, family history of heart disease, morbid obesity (Body mass index is 61.23 kg/m.).  Dyspnea on exertion: Multifactorial: EKG is normal sinus without underlying ischemia. He had an echocardiogram recently at an outside facility records reviewed from care everywhere the study was suboptimal due to body habitus. Symptoms have improved since optimizing his antihypertensive medications and weaning him off of clonidine. Patient states that his shortness of breath remains relatively stable.  And has not had any reoccurrence of chest pain.  I still offered him ischemic evaluation as he is lost weight and potentially could have a coronary CTA or left heart catheterization.  However, patient states that he would like to continue to work on lifestyle changes to lose weight and have it reevaluated if he has worsening symptoms or develop chest pain. Patient is also encouraged to follow-up with pulmonary medicine he will discuss it further with PCP.  Benign essential hypertension: Since last visit has successfully weaned off of clonidine. Refilled olmesartan. Repeat blood work from 09/08/2020 independently reviewed which notes stable kidney function and electrolytes.  Low-salt diet recommended Medications reconciled.  Palpitations: Resolved. Would like to hold off any additional testing at  this time.  Mixed hyperlipidemia: Currently on statin therapy.  Insulin-dependent diabetes mellitus type 2: Educated on the importance of glycemic control.  Sees endocrinology.   Obesity, due to excess calories: Body mass index is 61.23 kg/m. I reviewed with the patient the importance of diet, regular physical activity/exercise, weight loss.   Patient is educated on increasing physical activity gradually as tolerated.  With the goal of moderate intensity exercise for 30 minutes a day 5 days a week.   FINAL MEDICATION LIST END OF ENCOUNTER: Meds ordered this encounter  Medications   olmesartan (BENICAR) 20 MG tablet    Sig: Take 1 tablet (20 mg total) by mouth daily.    Dispense:  30 tablet    Refill:  0    Medications Discontinued During This Encounter  Medication Reason   cloNIDine (CATAPRES) 0.1 MG tablet Discontinued by provider   olmesartan (BENICAR) 20 MG tablet Reorder     Current Outpatient Medications:    ACCU-CHEK AVIVA PLUS test strip, USE TO CHECK BLOOD SUGAR 4 TIMES DAILY., Disp: 100 strip, Rfl: 2   Accu-Chek FastClix Lancets MISC, USE TO CHECK BLOOD SUGAR FOUR TIMES A DAY., Disp: 102 each, Rfl: 2   albuterol (PROAIR HFA) 108 (90 Base) MCG/ACT inhaler, Inhale 2 puffs into the lungs every 4 (four) hours as needed for wheezing or shortness of breath. Sometimes only uses one puff, Disp: 18 g, Rfl: 2   albuterol (PROVENTIL) (2.5 MG/3ML) 0.083% nebulizer solution, Take 3 mLs (2.5 mg total) by nebulization every 6 (six) hours as needed for wheezing or shortness of breath., Disp: 150 mL, Rfl: 1   allopurinol (ZYLOPRIM) 300 MG tablet, Take 300 mg by mouth daily.  , Disp: , Rfl:    ALPRAZolam (XANAX) 0.5 MG tablet, Take 1 tablet by mouth 2 (two) times daily as needed., Disp: , Rfl:    Blood Glucose Monitoring Suppl (ACCU-CHEK GUIDE) w/Device KIT, 1 Piece by Does not apply route as directed., Disp: 1 kit, Rfl: 0   BYDUREON BCISE 2 MG/0.85ML AUIJ, INJECT INTO THE SKIN ONCE A WEEK.,  Disp: 3.4 mL, Rfl: 2   diclofenac Sodium (VOLTAREN) 1 % GEL, Apply topically 4 (four) times daily., Disp: , Rfl:    furosemide (LASIX) 40 MG tablet, Take 1 tablet (40 mg total) by mouth daily. (Patient taking differently: Take 40 mg by mouth 2 (two) times daily.), Disp: 30 tablet, Rfl: 0   glipiZIDE (GLUCOTROL XL) 5 MG 24 hr tablet, TAKE 1 TABLET ONCE DAILY WITH BREAKFAST., Disp: 30 tablet, Rfl: 2   GLOBAL EASE INJECT PEN NEEDLES 31G X 8 MM MISC, USE 4 TIMES DAILY., Disp: 100 each, Rfl: 3   HUMULIN R U-500 KWIKPEN 500 UNIT/ML KwikPen, INJECT 120 UNITS WITH BREAKFAST AND LUNCH, 110 UNITS WITH SUPPER IF GLUCOSE ABOVE 90., Disp: 18 mL, Rfl: 0   indomethacin (INDOCIN) 50 MG capsule, Take 50 mg by mouth daily as needed., Disp: , Rfl:    metFORMIN (GLUCOPHAGE) 500 MG tablet, TAKE 2 TABLETS BY MOUTH TWICE DAILY., Disp: 360 tablet, Rfl: 0   Multiple Vitamin (MULTIVITAMIN WITH MINERALS) TABS tablet, Take 1 tablet by mouth daily., Disp: , Rfl:    oxyCODONE-acetaminophen (PERCOCET) 7.5-325 MG tablet, Take 1 tablet by mouth every 6 (six) hours as needed for severe pain., Disp: , Rfl:    pantoprazole (PROTONIX) 40 MG tablet, TAKE 1 TABLET DAILY, 30 MINUTES BEFORE BREAKFAST `,  Disp: 30 tablet, Rfl: 3   Potassium Chloride ER 20 MEQ TBCR, Take 20 mEq by mouth daily., Disp: , Rfl:    pravastatin (PRAVACHOL) 40 MG tablet, Take 40 mg by mouth daily., Disp: , Rfl:    verapamil (CALAN-SR) 240 MG CR tablet, Take 240 mg by mouth daily., Disp: , Rfl:    Vitamin D, Ergocalciferol, (DRISDOL) 1.25 MG (50000 UNIT) CAPS capsule, Take 1 capsule (50,000 Units total) by mouth once a week., Disp: 4 capsule, Rfl: 3   olmesartan (BENICAR) 20 MG tablet, Take 1 tablet (20 mg total) by mouth daily., Disp: 30 tablet, Rfl: 0  No orders of the defined types were placed in this encounter.   There are no Patient Instructions on file for this visit.   --Continue cardiac medications as reconciled in final medication list. --Return in  about 3 months (around 12/14/2020) for Follow up, Dyspnea, Chest pain. Or sooner if needed. --Continue follow-up with your primary care physician regarding the management of your other chronic comorbid conditions.  Patient's questions and concerns were addressed to his satisfaction. He voices understanding of the instructions provided during this encounter.   This note was created using a voice recognition software as a result there may be grammatical errors inadvertently enclosed that do not reflect the nature of this encounter. Every attempt is made to correct such errors.  Rex Kras, [place], [facility]  Pager: [phone] Office: [phone]

## 2020-09-26 ENCOUNTER — Other Ambulatory Visit: Payer: Self-pay | Admitting: "Endocrinology

## 2020-09-30 ENCOUNTER — Ambulatory Visit: Payer: Medicaid Other | Admitting: "Endocrinology

## 2020-10-02 ENCOUNTER — Other Ambulatory Visit: Payer: Self-pay | Admitting: Nurse Practitioner

## 2020-10-02 DIAGNOSIS — IMO0002 Reserved for concepts with insufficient information to code with codable children: Secondary | ICD-10-CM

## 2020-10-02 DIAGNOSIS — E1165 Type 2 diabetes mellitus with hyperglycemia: Secondary | ICD-10-CM

## 2020-10-06 ENCOUNTER — Other Ambulatory Visit: Payer: Self-pay

## 2020-10-06 ENCOUNTER — Encounter: Payer: Self-pay | Admitting: "Endocrinology

## 2020-10-06 ENCOUNTER — Ambulatory Visit: Payer: Medicaid Other | Admitting: "Endocrinology

## 2020-10-06 VITALS — BP 106/56 | HR 80 | Ht 71.0 in | Wt >= 6400 oz

## 2020-10-06 DIAGNOSIS — E782 Mixed hyperlipidemia: Secondary | ICD-10-CM | POA: Diagnosis not present

## 2020-10-06 DIAGNOSIS — Z794 Long term (current) use of insulin: Secondary | ICD-10-CM

## 2020-10-06 DIAGNOSIS — I10 Essential (primary) hypertension: Secondary | ICD-10-CM | POA: Diagnosis not present

## 2020-10-06 DIAGNOSIS — E1169 Type 2 diabetes mellitus with other specified complication: Secondary | ICD-10-CM | POA: Diagnosis not present

## 2020-10-06 DIAGNOSIS — E119 Type 2 diabetes mellitus without complications: Secondary | ICD-10-CM | POA: Insufficient documentation

## 2020-10-06 DIAGNOSIS — E1165 Type 2 diabetes mellitus with hyperglycemia: Secondary | ICD-10-CM | POA: Diagnosis not present

## 2020-10-06 LAB — POCT GLYCOSYLATED HEMOGLOBIN (HGB A1C): HbA1c, POC (controlled diabetic range): 7.7 % — AB (ref 0.0–7.0)

## 2020-10-06 MED ORDER — HUMULIN R U-500 KWIKPEN 500 UNIT/ML ~~LOC~~ SOPN: 100.0000 [IU] | PEN_INJECTOR | Freq: Three times a day (TID) | SUBCUTANEOUS | 2 refills | Status: DC

## 2020-10-06 NOTE — Progress Notes (Signed)
06/26/2020  Endocrinology follow-up note  Subjective:    Patient ID: Aaron Potter, male    DOB: [birth year], PCP Leeanne Rio, MD   Past Medical History:  Diagnosis Date   Allergic rhinitis, unspecified    Allergy    Anxiety disorder, unspecified    Asthma    Back pain    Balanitis    Cellulitis    COPD (chronic obstructive pulmonary disease) ([place])    Diabetes mellitus without complication ([place])    Essential (primary) hypertension    Gastro-esophageal reflux disease with esophagitis    Gout    Gout, unspecified    Hypertension    Lichen sclerosus    Mixed hyperlipidemia    Morbid (severe) obesity with alveolar hypoventilation (HCC)    Obesity, morbid (more than 100 lbs over ideal weight or BMI > 40) (HCC)    Obesity, unspecified    Polyosteoarthritis, unspecified    Sleep apnea    Sleep apnea, unspecified    CPAP   Type 2 diabetes mellitus with hyperglycemia (HCC)    Urticaria    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    Past Surgical History:  Procedure Laterality Date   CHOLECYSTECTOMY     ERCP  [place], CBD stone s/p cholecystectomy   FOOT SURGERY Right 04/2017   Social History   Socioeconomic History   Marital status: Single    Spouse name: Not on file   Number of children: Not on file   Years of education: Not on file   Highest education level: Not on file  Occupational History   Not on file  Tobacco Use   Smoking status: Never   Smokeless tobacco: Never  Vaping Use   Vaping Use: Never used  Substance and Sexual Activity   Alcohol use: No    Alcohol/week: 0.0 standard drinks   Drug use: No   Sexual activity: Yes  Other Topics Concern   Not on file  Social History Narrative   Not on file   Social Determinants of Health   Financial Resource Strain: Not on file  Food Insecurity: Not on file  Transportation Needs: Not on file  Physical Activity: Not on file  Stress: Not on file  Social Connections:  Not on file   Outpatient Encounter Medications as of 06/26/2020  Medication Sig   ACCU-CHEK AVIVA PLUS test strip USE TO CHECK BLOOD SUGAR 4 TIMES DAILY.   Accu-Chek FastClix Lancets MISC USE TO CHECK BLOOD SUGAR FOUR TIMES A DAY.   albuterol (PROAIR HFA) 108 (90 Base) MCG/ACT inhaler Inhale 2 puffs into the lungs every 4 (four) hours as needed for wheezing or shortness of breath. Sometimes only uses one puff   albuterol (PROVENTIL) (2.5 MG/3ML) 0.083% nebulizer solution Take 3 mLs (2.5 mg total) by nebulization every 6 (six) hours as needed for wheezing or shortness of breath.   allopurinol (ZYLOPRIM) 300 MG tablet Take 300 mg by mouth daily.     ALPRAZolam (XANAX) 0.5 MG tablet Take 1 tablet by mouth 2 (two) times daily as needed.   Blood Glucose Monitoring Suppl (ACCU-CHEK GUIDE) w/Device KIT 1 Piece by Does not apply route as directed.   BYDUREON BCISE 2 MG/0.85ML AUIJ INJECT INTO THE SKIN ONCE A WEEK.   cloNIDine (CATAPRES) 0.1 MG tablet TAKE (1) TABLET TWICE DAILY.   diclofenac Sodium (VOLTAREN) 1 % GEL Apply topically 4 (four) times daily.   diphenhydrAMINE (BENADRYL) 25 mg capsule Take 50 mg by  mouth every 6 (six) hours as needed for itching.   furosemide (LASIX) 40 MG tablet Take 1 tablet (40 mg total) by mouth daily.   glipiZIDE (GLUCOTROL XL) 5 MG 24 hr tablet TAKE 1 TABLET ONCE DAILY WITH BREAKFAST.   GLOBAL EASE INJECT PEN NEEDLES 31G X 8 MM MISC USE 4 TIMES DAILY.   indomethacin (INDOCIN) 50 MG capsule Take 50 mg by mouth daily.    insulin regular human CONCENTRATED (HUMULIN R U-500 KWIKPEN) 500 UNIT/ML kwikpen INJECT 120 UNITS WITH BREAKFAST AND LUNCH, 110 UNITS WITH SUPPER IF GLUCOSE ABOVE 90.   metFORMIN (GLUCOPHAGE) 500 MG tablet TAKE 2 TABLETS BY MOUTH TWICE DAILY.   Multiple Vitamin (MULTIVITAMIN WITH MINERALS) TABS tablet Take 1 tablet by mouth daily.   oxyCODONE-acetaminophen (PERCOCET) 7.5-325 MG tablet Take 1 tablet by mouth every 4 (four) hours as needed for severe pain.    pantoprazole (PROTONIX) 40 MG tablet TAKE 1 TABLET DAILY, 30 MINUTES BEFORE BREAKFAST `   potassium chloride (KLOR-CON) 10 MEQ tablet Take 20 mEq by mouth daily.   pravastatin (PRAVACHOL) 40 MG tablet Take 40 mg by mouth daily.   verapamil (CALAN-SR) 240 MG CR tablet Take 240 mg by mouth daily.   Vitamin D, Ergocalciferol, (DRISDOL) 1.25 MG (50000 UNIT) CAPS capsule Take 1 capsule (50,000 Units total) by mouth once a week.   [DISCONTINUED] HUMULIN R U-500 KWIKPEN 500 UNIT/ML kwikpen INJECT 120 UNITS WITH BREAKFAST AND LUNCH, 100 UNITS WITH SUPPER IF GLUCOSE ABOVE 90.   No facility-administered encounter medications on file as of 06/26/2020.   ALLERGIES: Allergies  Allergen Reactions   Biaxin [Clarithromycin] Anaphylaxis and Shortness Of Breath   VACCINATION STATUS: Immunization History  Administered Date(s) Administered   Influenza-Unspecified 10/18/2010, 10/18/2011, 10/18/2012, 12/11/2013, 09/11/2014, 10/13/2015, 09/06/2018   Moderna Sars-Covid-2 Vaccination 04/01/2019, 04/29/2019   Tdap 04/13/2016    Diabetes He presents for his follow-up diabetic visit. He has type 2 diabetes mellitus. Onset time: He was diagnosed at approximate age of 65 years. His disease course has been worsening. There are no hypoglycemic associated symptoms. Pertinent negatives for hypoglycemia include no confusion, headaches, pallor or seizures. Associated symptoms include polydipsia and polyuria. Pertinent negatives for diabetes include no chest pain, no fatigue, no polyphagia and no weakness. There are no hypoglycemic complications. Symptoms are improving. Diabetic complications include PVD. Risk factors for coronary artery disease include dyslipidemia, diabetes mellitus, family history, male sex, obesity and sedentary lifestyle. Current diabetic treatment includes intensive insulin program. He is compliant with treatment most of the time. His weight is increasing steadily. He is following a generally unhealthy  diet. When asked about meal planning, he reported none. He has had a previous visit with a dietitian. He never participates in exercise. His home blood glucose trend is decreasing steadily. His breakfast blood glucose range is generally 140-180 mg/dl. His lunch blood glucose range is generally 140-180 mg/dl. His dinner blood glucose range is generally 140-180 mg/dl. His bedtime blood glucose range is generally 140-180 mg/dl. His overall blood glucose range is 140-180 mg/dl. (He presents with slight improvement in his glycemic profile.  His point-of-care A1c is 7.7%, overall improving.  His average blood glucose is between 150-200 mg per DL.  He did not have any major hypoglycemia documented or reported.    ) An ACE inhibitor/angiotensin II receptor blocker is being taken.  Hyperlipidemia This is a chronic problem. The current episode started more than 1 year ago. The problem is uncontrolled. Exacerbating diseases include diabetes and obesity. Associated symptoms include  shortness of breath. Pertinent negatives include no chest pain or myalgias. Current antihyperlipidemic treatment includes statins. Risk factors for coronary artery disease include diabetes mellitus, dyslipidemia, hypertension, male sex, obesity and a sedentary lifestyle.  Hypertension This is a chronic problem. The current episode started more than 1 year ago. The problem is uncontrolled. Associated symptoms include shortness of breath. Pertinent negatives include no chest pain, headaches, neck pain or palpitations. Risk factors for coronary artery disease include diabetes mellitus, dyslipidemia, obesity, male gender, sedentary lifestyle and family history. Past treatments include ACE inhibitors and calcium channel blockers. The current treatment provides no improvement. Compliance problems include diet.  Hypertensive end-organ damage includes PVD.    Review of systems  Limited as above.   Objective:    BP (!) 112/58   Pulse 72   Ht  5' 11"  (1.803 m)   Wt (!) 456 lb (206.8 kg)   BMI 63.60 kg/m   Wt Readings from Last 3 Encounters:  06/26/20 (!) 456 lb (206.8 kg)  03/25/20 (!) 454 lb 9.6 oz (206.2 kg)  12/25/19 (!) 448 lb 3.2 oz (203.3 kg)     Physical Exam- Limited  Edematous lower extremities. Calluses, poor circulation on bilateral lower extremities.  Diminished monofilament test. Results for orders placed or performed in visit on 06/26/20  HgB A1c  Result Value Ref Range   Hemoglobin A1C     HbA1c POC (<> result, manual entry)     HbA1c, POC (prediabetic range)     HbA1c, POC (controlled diabetic range) 8.0 (A) 0.0 - 7.0 %   Diabetic Labs (most recent): Lab Results  Component Value Date   HGBA1C 8.0 (A) 06/26/2020   HGBA1C 7.8 (A) 03/25/2020   HGBA1C 7 12/25/2019   Lipid Panel     Component Value Date/Time   CHOL 137 04/01/2019 0843   TRIG 121 04/01/2019 0843   HDL 36 (L) 04/01/2019 0843   CHOLHDL 3.8 04/01/2019 0843   VLDL 31 (H) 03/12/2015 1200   LDLCALC 79 04/01/2019 0843   CMP Latest Ref Rng & Units 06/22/2020 12/19/2019 04/01/2019  Glucose 65 - 99 mg/dL 215(H) 167(H) 215(H)  BUN 6 - 24 mg/dL 11 12 13   Creatinine 0.76 - 1.27 mg/dL 0.93 0.78 0.77  Sodium 134 - 144 mmol/L 139 140 141  Potassium 3.5 - 5.2 mmol/L 4.5 4.8 4.6  Chloride 96 - 106 mmol/L 95(L) 98 104  CO2 20 - 29 mmol/L 29 26 28   Calcium 8.7 - 10.2 mg/dL 9.3 10.6(H) 9.5  Total Protein 6.0 - 8.5 g/dL 6.9 6.3 6.6  Total Bilirubin 0.0 - 1.2 mg/dL 0.4 0.4 0.4  Alkaline Phos 44 - 121 IU/L 94 82 -  AST 0 - 40 IU/L 42(H) 32 32  ALT 0 - 44 IU/L 32 33 31     Assessment & Plan:   1. Uncontrolled type 2 diabetes mellitus with diabetic peripheral angiopathy without gangrene, with long-term current use of insulin ([place])  He presents with slight improvement in his glycemic profile.  His point-of-care A1c is 7.7%, overall improving.  His average blood glucose is between 150-200 mg per DL.  He did not have any major hypoglycemia documented  or reported.    He remains at extremely high risk for more acute and chronic complications of diabetes which include CAD, CVA, CKD, retinopathy, and neuropathy. These are all discussed in detail with the patient.   Glucose logs and insulin administration records pertaining to this visit,  to be scanned into patient's records.  Recent labs reviewed.  - I have re-counseled the patient on diet management and weight loss  by adopting a carbohydrate restricted / protein rich  Diet.  -He still admits to dietary indiscretion including consumption of and sweetened beverages. - he acknowledges that there is a room for improvement in his food and drink choices. - Suggestion is made for him to avoid simple carbohydrates  from his diet including Cakes, Sweet Desserts, Ice Cream, Soda (diet and regular), Sweet Tea, Candies, Chips, Cookies, Store Bought Juices, Alcohol in Excess of  1-2 drinks a day, Artificial Sweeteners,  Coffee Creamer, and "Sugar-free" Products, Lemonade. This will help patient to have more stable blood glucose profile and potentially avoid unintended weight gain.   - Patient is advised to stick to a routine mealtimes to eat 3 meals  a day and avoid unnecessary snacks ( to snack only to correct hypoglycemia).   -Unfortunately, he did not make any significant change in his dietary habits.  He will continue to need multiple daily injections of high-dose insulin in order for him to maintain control of diabetes to target.    -Plant predominant Whole Foods lifestyle nutrition is discussed in detail once again and recommended to him.  He promises that he will do better on his snacking habits.  -For now, he is advised to decrease Humulin U500 to  to 120 units with breakfast, 120 units with lunch, 100 units with supper  for pre-meal blood glucose readings of 90 or above milligrams per deciliter associated with strict monitoring of blood glucose 4 times a day-before meals and at bedtime.     -Patient is encouraged to call clinic for blood glucose levels less than 70 or above 300 mg /dl. -He is advised to continue Metformin 1000 mg p.o. twice daily-with breakfast and supper.    -He is also advised to continue Bydureon 2 mg subcutaneously weekly.  He has benefited from glipizide intervention, advised to continue glipizide 5 mg XL p.o. daily at breakfast.  - He is a perfect candidate for bariatric surgery, he has been hesitant to consider this option. -He is following with Jearld Fenton, CDE for DM education.  - Patient specific target  for A1c; LDL, HDL, Triglycerides,  were discussed in detail.  2) BP/HTN: -His blood pressure is controlled to target.  Patient admits he has not taken his blood pressure medications this morning.    He is advised to proceed with his benazepril to 10 mg p.o. daily, also has verapamil 240 mg p.o. daily for blood pressure treatment.   3) Lipids/HPL: He has uncontrolled LDL, however improving LDL at 79 from 99 .  He is advised to continue pravastatin 40 mg p.o. nightly.        Side effects and precautions discussed with him.    4)  Weight/Diet: His BMI is 12.19---XJOITGP complicating his diabetes care.  He is a candidate for major weight loss.   He has had no significant success in weight control.  He still admits to dietary indiscretion.   CDE consult in progress, exercise, and carbohydrates information provided.  He hesitates to consider bariatric surgery. Would benefit from a pair of diabetic shoes.  I have filled out paperwork for him to get a pair from his pharmacy.  5) Chronic Care/Health Maintenance:  -Patient  is  on ACEI/ARB and Statin medications and encouraged to continue to follow up with Ophthalmology, Podiatrist at least yearly or according to recommendations, and advised to  stay away from smoking. I  have recommended yearly flu vaccine and pneumonia vaccination at least every 5 years; and  sleep for at least 7 hours a day. -This  patient cannot exercise optimally due to his heavy weight and comorbidities.  - I advised patient to maintain close follow up with Leeanne Rio, MD for primary care needs.   I spent 42 minutes in the care of the patient today including review of labs from [facility], Lipids, Thyroid Function, Hematology (current and previous including abstractions from other facilities); face-to-face time discussing  his blood glucose readings/logs, discussing hypoglycemia and hyperglycemia episodes and symptoms, medications doses, his options of short and long term treatment based on the latest standards of care / guidelines;  discussion about incorporating lifestyle medicine;  and documenting the encounter.    Please refer to Patient Instructions for Blood Glucose Monitoring and Insulin/Medications Dosing Guide"  in media tab for additional information. Please  also refer to " Patient Self Inventory" in the Media  tab for reviewed elements of pertinent patient history.  Aaron Potter participated in the discussions, expressed understanding, and voiced agreement with the above plans.  All questions were answered to his satisfaction. he is encouraged to contact clinic should he have any questions or concerns prior to his return visit.     Follow up plan: -Return in about 3 months (around 09/28/2020) for Bring Meter and Logs- A1c in Office.  Glade Lloyd, MD Phone: [phone]  Fax: [phone]  -  This note was partially dictated with voice recognition software. Similar sounding words can be transcribed inadequately or may not  be corrected upon review.  06/26/2020, 3:34 PM

## 2020-10-06 NOTE — Patient Instructions (Signed)

## 2020-10-30 ENCOUNTER — Other Ambulatory Visit: Payer: Self-pay | Admitting: "Endocrinology

## 2020-11-23 ENCOUNTER — Encounter: Payer: Self-pay | Admitting: Internal Medicine

## 2020-12-15 ENCOUNTER — Ambulatory Visit: Payer: Medicaid Other | Admitting: Cardiology

## 2020-12-28 ENCOUNTER — Other Ambulatory Visit: Payer: Self-pay | Admitting: "Endocrinology

## 2021-01-13 ENCOUNTER — Other Ambulatory Visit: Payer: Self-pay

## 2021-01-13 ENCOUNTER — Ambulatory Visit: Payer: Medicaid Other | Admitting: "Endocrinology

## 2021-01-13 ENCOUNTER — Encounter: Payer: Self-pay | Admitting: "Endocrinology

## 2021-01-13 VITALS — BP 126/58 | HR 76 | Ht 71.0 in | Wt >= 6400 oz

## 2021-01-13 DIAGNOSIS — E1169 Type 2 diabetes mellitus with other specified complication: Secondary | ICD-10-CM | POA: Diagnosis not present

## 2021-01-13 DIAGNOSIS — E782 Mixed hyperlipidemia: Secondary | ICD-10-CM

## 2021-01-13 DIAGNOSIS — I10 Essential (primary) hypertension: Secondary | ICD-10-CM

## 2021-01-13 DIAGNOSIS — E1165 Type 2 diabetes mellitus with hyperglycemia: Secondary | ICD-10-CM

## 2021-01-13 DIAGNOSIS — Z794 Long term (current) use of insulin: Secondary | ICD-10-CM | POA: Diagnosis not present

## 2021-01-13 LAB — POCT GLYCOSYLATED HEMOGLOBIN (HGB A1C): HbA1c, POC (controlled diabetic range): 8.1 % — AB (ref 0.0–7.0)

## 2021-01-13 MED ORDER — HUMULIN R U-500 KWIKPEN 500 UNIT/ML ~~LOC~~ SOPN: 100.0000 [IU] | PEN_INJECTOR | Freq: Three times a day (TID) | SUBCUTANEOUS | 2 refills | Status: DC

## 2021-01-13 NOTE — Patient Instructions (Signed)

## 2021-01-13 NOTE — Progress Notes (Signed)
01/13/2021  Endocrinology follow-up note  Subjective:    Patient ID: Aaron Potter, male    DOB: [birth year], PCP Leeanne Rio, MD   Past Medical History:  Diagnosis Date   Allergic rhinitis, unspecified    Allergy    Anxiety disorder, unspecified    Asthma    Back pain    Balanitis    Cellulitis    COPD (chronic obstructive pulmonary disease) ([place])    Diabetes mellitus without complication ([place])    Essential (primary) hypertension    Gastro-esophageal reflux disease with esophagitis    Gout    Gout, unspecified    Hypertension    Lichen sclerosus    Mixed hyperlipidemia    Morbid (severe) obesity with alveolar hypoventilation (HCC)    Obesity, morbid (more than 100 lbs over ideal weight or BMI > 40) (HCC)    Obesity, unspecified    Polyosteoarthritis, unspecified    Sleep apnea    Sleep apnea, unspecified    CPAP   Type 2 diabetes mellitus with hyperglycemia (HCC)    Urticaria    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    Past Surgical History:  Procedure Laterality Date   CHOLECYSTECTOMY     ERCP  [place], CBD stone s/p cholecystectomy   FOOT SURGERY Right 04/2017   Social History   Socioeconomic History   Marital status: Single    Spouse name: Not on file   Number of children: Not on file   Years of education: Not on file   Highest education level: Not on file  Occupational History   Not on file  Tobacco Use   Smoking status: Never   Smokeless tobacco: Never  Vaping Use   Vaping Use: Never used  Substance and Sexual Activity   Alcohol use: No    Alcohol/week: 0.0 standard drinks   Drug use: No   Sexual activity: Yes  Other Topics Concern   Not on file  Social History Narrative   Not on file   Social Determinants of Health   Financial Resource Strain: Not on file  Food Insecurity: Not on file  Transportation Needs: Not on file  Physical Activity: Not on file  Stress: Not on file  Social Connections:  Not on file   Outpatient Encounter Medications as of 01/13/2021  Medication Sig   ACCU-CHEK AVIVA PLUS test strip USE TO CHECK BLOOD SUGAR 4 TIMES DAILY.   Accu-Chek FastClix Lancets MISC USE TO CHECK BLOOD SUGAR FOUR TIMES A DAY.   albuterol (PROAIR HFA) 108 (90 Base) MCG/ACT inhaler Inhale 2 puffs into the lungs every 4 (four) hours as needed for wheezing or shortness of breath. Sometimes only uses one puff   albuterol (PROVENTIL) (2.5 MG/3ML) 0.083% nebulizer solution Take 3 mLs (2.5 mg total) by nebulization every 6 (six) hours as needed for wheezing or shortness of breath.   allopurinol (ZYLOPRIM) 300 MG tablet Take 300 mg by mouth daily.     ALPRAZolam (XANAX) 0.5 MG tablet Take 1 tablet by mouth 2 (two) times daily as needed.   Blood Glucose Monitoring Suppl (ACCU-CHEK GUIDE) w/Device KIT 1 Piece by Does not apply route as directed.   BYDUREON BCISE 2 MG/0.85ML AUIJ INJECT INTO THE SKIN ONCE A WEEK.   diclofenac Sodium (VOLTAREN) 1 % GEL Apply topically 4 (four) times daily.   furosemide (LASIX) 40 MG tablet Take 1 tablet (40 mg total) by mouth daily. (Patient taking differently: Take 40 mg by  mouth 2 (two) times daily.)   glipiZIDE (GLUCOTROL XL) 5 MG 24 hr tablet TAKE 1 TABLET ONCE DAILY WITH BREAKFAST.   GLOBAL EASE INJECT PEN NEEDLES 31G X 8 MM MISC USE 4 TIMES DAILY.   indomethacin (INDOCIN) 50 MG capsule Take 50 mg by mouth daily as needed.   insulin regular human CONCENTRATED (HUMULIN R U-500 KWIKPEN) 500 UNIT/ML KwikPen Inject 100-130 Units into the skin 3 (three) times daily with meals.   metFORMIN (GLUCOPHAGE) 500 MG tablet TAKE 2 TABLETS BY MOUTH TWICE DAILY.   Multiple Vitamin (MULTIVITAMIN WITH MINERALS) TABS tablet Take 1 tablet by mouth daily.   olmesartan (BENICAR) 20 MG tablet Take 1 tablet (20 mg total) by mouth daily.   oxyCODONE-acetaminophen (PERCOCET) 7.5-325 MG tablet Take 1 tablet by mouth every 6 (six) hours as needed for severe pain.   pantoprazole (PROTONIX) 40  MG tablet TAKE 1 TABLET DAILY, 30 MINUTES BEFORE BREAKFAST `   Potassium Chloride ER 20 MEQ TBCR Take 20 mEq by mouth daily.   pravastatin (PRAVACHOL) 40 MG tablet Take 40 mg by mouth daily.   verapamil (CALAN-SR) 240 MG CR tablet Take 240 mg by mouth daily.   Vitamin D, Ergocalciferol, (DRISDOL) 1.25 MG (50000 UNIT) CAPS capsule Take 1 capsule (50,000 Units total) by mouth once a week.   [DISCONTINUED] insulin regular human CONCENTRATED (HUMULIN R U-500 KWIKPEN) 500 UNIT/ML KwikPen Inject 100-120 Units into the skin 3 (three) times daily with meals.   No facility-administered encounter medications on file as of 01/13/2021.   ALLERGIES: Allergies  Allergen Reactions   Biaxin [Clarithromycin] Anaphylaxis and Shortness Of Breath   Ace Inhibitors     D/c ACEi 05/20/2019 > changed to clonidine for pain control component   Although even in retrospect it may not be clear the ACEi contributed to the pt's symptoms, adding them back at this point or in the future would risk confusion in interpretation of non-specific respiratory symptoms to which this patient is prone ie Better not to muddy the waters here.     VACCINATION STATUS: Immunization History  Administered Date(s) Administered   Influenza-Unspecified 10/18/2010, 10/18/2011, 10/18/2012, 12/11/2013, 09/11/2014, 10/13/2015, 09/06/2018   Moderna Sars-Covid-2 Vaccination 04/01/2019, 04/29/2019   Tdap 04/13/2016    Diabetes He presents for his follow-up diabetic visit. He has type 2 diabetes mellitus. Onset time: He was diagnosed at approximate age of 83 years. His disease course has been worsening. There are no hypoglycemic associated symptoms. Pertinent negatives for hypoglycemia include no confusion, headaches, pallor or seizures. Associated symptoms include polydipsia and polyuria. Pertinent negatives for diabetes include no chest pain, no fatigue, no polyphagia and no weakness. There are no hypoglycemic complications. Symptoms are worsening.  Diabetic complications include PVD. Risk factors for coronary artery disease include dyslipidemia, diabetes mellitus, family history, male sex, obesity and sedentary lifestyle. Current diabetic treatment includes intensive insulin program. He is compliant with treatment most of the time. His weight is increasing steadily. He is following a generally unhealthy diet. When asked about meal planning, he reported none. He has had a previous visit with a dietitian. He never participates in exercise. His home blood glucose trend is increasing steadily. His breakfast blood glucose range is generally 140-180 mg/dl. His lunch blood glucose range is generally 180-200 mg/dl. His dinner blood glucose range is generally 180-200 mg/dl. His bedtime blood glucose range is generally 180-200 mg/dl. His overall blood glucose range is 180-200 mg/dl. (He presents with slight improvement in his fasting glycemic profile, however continued to have  postprandial hyperglycemia.  His point-of-care A1c is 8.1%, increasing from 7.7%.  He did not document any hypoglycemia.     ) An ACE inhibitor/angiotensin II receptor blocker is being taken.  Hyperlipidemia This is a chronic problem. The current episode started more than 1 year ago. The problem is uncontrolled. Exacerbating diseases include diabetes and obesity. Associated symptoms include shortness of breath. Pertinent negatives include no chest pain or myalgias. Current antihyperlipidemic treatment includes statins. Risk factors for coronary artery disease include diabetes mellitus, dyslipidemia, hypertension, male sex, obesity and a sedentary lifestyle.  Hypertension This is a chronic problem. The current episode started more than 1 year ago. The problem is uncontrolled. Associated symptoms include shortness of breath. Pertinent negatives include no chest pain, headaches, neck pain or palpitations. Risk factors for coronary artery disease include diabetes mellitus, dyslipidemia,  obesity, male gender, sedentary lifestyle and family history. Past treatments include ACE inhibitors and calcium channel blockers. The current treatment provides no improvement. Compliance problems include diet.  Hypertensive end-organ damage includes PVD.    Review of systems  Limited as above.   Objective:    BP (!) 126/58    Pulse 76    Ht _0  (1.803 m)    Wt (!) 466 lb 3.2 oz (211.5 kg)    BMI 65.02 kg/m   Wt Readings from Last 3 Encounters:  01/13/21 (!) 466 lb 3.2 oz (211.5 kg)  10/06/20 (!) 457 lb 12.8 oz (207.7 kg)  09/14/20 (!) 439 lb (199.1 kg)     Physical Exam- Limited  Edematous lower extremities. Calluses, poor circulation on bilateral lower extremities.  Diminished monofilament test. Results for orders placed or performed in visit on 01/13/21  HgB A1c  Result Value Ref Range   Hemoglobin A1C     HbA1c POC (<> result, manual entry)     HbA1c, POC (prediabetic range)     HbA1c, POC (controlled diabetic range) 8.1 (A) 0.0 - 7.0 %   Diabetic Labs (most recent): Lab Results  Component Value Date   HGBA1C 8.1 (A) 01/13/2021   HGBA1C 7.7 (A) 10/06/2020   HGBA1C 8.0 (A) 06/26/2020   Lipid Panel     Component Value Date/Time   CHOL 137 04/01/2019 0843   TRIG 121 04/01/2019 0843   HDL 36 (L) 04/01/2019 0843   CHOLHDL 3.8 04/01/2019 0843   VLDL 31 (H) 03/12/2015 1200   LDLCALC 79 04/01/2019 0843   CMP Latest Ref Rng & Units 09/08/2020 08/12/2020 06/22/2020  Glucose 65 - 99 mg/dL 237(H) 158(H) 215(H)  BUN 6 - 24 mg/dL _1 Creatinine 0.76 - 1.27 mg/dL 0.83 0.78 0.93  Sodium 134 - 144 mmol/L 141 139 139  Potassium 3.5 - 5.2 mmol/L 5.0 5.0 4.5  Chloride 96 - 106 mmol/L 99 96 95(L)  CO2 20 - 29 mmol/L _2 Calcium 8.7 - 10.2 mg/dL 9.7 10.1 9.3  Total Protein 6.0 - 8.5 g/dL - - 6.9  Total Bilirubin 0.0 - 1.2 mg/dL - - 0.4  Alkaline Phos 44 - 121 IU/L - - 94  AST 0 - 40 IU/L - - 42(H)  ALT 0 - 44 IU/L - - 32     Assessment & Plan:   1.  Uncontrolled type 2 diabetes mellitus with diabetic peripheral angiopathy without gangrene, with long-term current use of insulin ([place]) He presents with slight improvement in his fasting glycemic profile, however continued to have postprandial hyperglycemia.  His point-of-care A1c is 8.1%, increasing from 7.7%.  He did not document any hypoglycemia.    He remains at extremely high risk for more acute and chronic complications of diabetes which include CAD, CVA, CKD, retinopathy, and neuropathy. These are all discussed in detail with the patient.   Glucose logs and insulin administration records pertaining to this visit,  to be scanned into patient's records.  Recent labs reviewed.  - I have re-counseled the patient on diet management and weight loss  by adopting a carbohydrate restricted / protein rich  Diet. - he acknowledges that there is a room for improvement in his food and drink choices. - Suggestion is made for him to avoid simple carbohydrates  from his diet including Cakes, Sweet Desserts, Ice Cream, Soda (diet and regular), Sweet Tea, Candies, Chips, Cookies, Store Bought Juices, Alcohol , Artificial Sweeteners,  Coffee Creamer, and "Sugar-free" Products, Lemonade. This will help patient to have more stable blood glucose profile and potentially avoid unintended weight gain.  The following Lifestyle Medicine recommendations according to [place]  [facility]) were discussed and and offered to patient and he  agrees to start the journey:  A. Whole Foods, Plant-Based Nutrition comprising of fruits and vegetables, plant-based proteins, whole-grain carbohydrates was discussed in detail with the patient.   A list for source of those nutrients were also provided to the patient.  Patient will use only water or unsweetened tea for hydration. B.  The need to stay away from risky substances including alcohol, smoking; obtaining 7 to 9 hours of restorative sleep, at least 150  minutes of moderate intensity exercise weekly, the importance of healthy social connections,  and stress management techniques were discussed.    - Patient is advised to stick to a routine mealtimes to eat 3 meals  a day and avoid unnecessary snacks ( to snack only to correct hypoglycemia).   -Unfortunately, he did not make any significant change in his dietary habits.  He will continue to need multiple daily injections of high-dose insulin in order for him to maintain control of diabetes to target.    -Plant predominant Whole Foods lifestyle nutrition is discussed in detail once again and recommended to him.  He promises that he will do better on his snacking habits.  -For now, he is advised to increase Humulin U500 to  130 units with breakfast, 130 units with lunch, 100 units with supper  for pre-meal blood glucose readings of 90 or above milligrams per deciliter associated with strict monitoring of blood glucose 4 times a day-before meals and at bedtime.    -Patient is encouraged to call clinic for blood glucose levels less than 70 or above 300 mg /dl. -He is advised to continue Metformin 1000 mg p.o. twice daily-with breakfast and supper.    -He is also advised to continue Bydureon 2 mg subcutaneously weekly.  He has benefited from glipizide intervention, advised to continue glipizide 5 mg XL p.o. daily at breakfast.  - He is a perfect candidate for bariatric surgery, he has been hesitant to consider this option. -He is following with Jearld Fenton, CDE for DM education.  - Patient specific target  for A1c; LDL, HDL, Triglycerides,  were discussed in detail.  2) BP/HTN: -His blood pressure is controlled to target.  Patient admits he has not taken his blood pressure medications this morning.    He is advised to proceed with his benazepril to 10 mg p.o. daily, also has verapamil 240 mg p.o. daily for blood pressure treatment.  3) Lipids/HPL: He has uncontrolled LDL, however improving  LDL at 79 from 99 .  He is advised to continue pravastatin 40 mg p.o. nightly.  Side effects and precautions discussed with him.     4)  Weight/Diet: His BMI is 84.66----[id number] complicating his diabetes care.  He is a candidate for major weight loss.   He has had no significant success in weight control.  He still admits to dietary indiscretion.   CDE consult in progress, exercise, and carbohydrates information provided.  He hesitates to consider bariatric surgery. Would benefit from a pair of diabetic shoes.  I have filled out paperwork for him to get a pair from his pharmacy.  5) Chronic Care/Health Maintenance:  -Patient  is  on ACEI/ARB and Statin medications and encouraged to continue to follow up with Ophthalmology, Podiatrist at least yearly or according to recommendations, and advised to  stay away from smoking. I have recommended yearly flu vaccine and pneumonia vaccination at least every 5 years; and  sleep for at least 7 hours a day. -This patient cannot exercise optimally due to his heavy weight and comorbidities.  - I advised patient to maintain close follow up with Leeanne Rio, MD for primary care needs.     I spent 45 minutes in the care of the patient today including review of labs from [facility], Lipids, Thyroid Function, Hematology (current and previous including abstractions from other facilities); face-to-face time discussing  his blood glucose readings/logs, discussing hypoglycemia and hyperglycemia episodes and symptoms, medications doses, his options of short and long term treatment based on the latest standards of care / guidelines;  discussion about incorporating lifestyle medicine;  and documenting the encounter.    Please refer to Patient Instructions for Blood Glucose Monitoring and Insulin/Medications Dosing Guide"  in media tab for additional information. Please  also refer to " Patient Self Inventory" in the Media  tab for reviewed elements of pertinent patient  history.  Aaron Potter participated in the discussions, expressed understanding, and voiced agreement with the above plans.  All questions were answered to his satisfaction. he is encouraged to contact clinic should he have any questions or concerns prior to his return visit.      Follow up plan: -Return in about 4 months (around 05/13/2021) for F/U with Pre-visit Labs, Meter, Logs, A1c here.Glade Lloyd, MD Phone: [phone]  Fax: [phone]  -  This note was partially dictated with voice recognition software. Similar sounding words can be transcribed inadequately or may not  be corrected upon review.  01/13/2021, 2:26 PM

## 2021-01-29 ENCOUNTER — Other Ambulatory Visit: Payer: Self-pay | Admitting: "Endocrinology

## 2021-03-01 ENCOUNTER — Encounter: Payer: Self-pay | Admitting: Adult Health

## 2021-03-01 ENCOUNTER — Other Ambulatory Visit: Payer: Self-pay

## 2021-03-01 ENCOUNTER — Ambulatory Visit (INDEPENDENT_AMBULATORY_CARE_PROVIDER_SITE_OTHER): Payer: Medicaid Other | Admitting: Adult Health

## 2021-03-01 VITALS — BP 128/68 | HR 71 | Temp 97.7°F | Ht 71.0 in | Wt >= 6400 oz

## 2021-03-01 DIAGNOSIS — G4733 Obstructive sleep apnea (adult) (pediatric): Secondary | ICD-10-CM

## 2021-03-01 DIAGNOSIS — R0609 Other forms of dyspnea: Secondary | ICD-10-CM | POA: Diagnosis not present

## 2021-03-01 DIAGNOSIS — E662 Morbid (severe) obesity with alveolar hypoventilation: Secondary | ICD-10-CM

## 2021-03-01 DIAGNOSIS — J9611 Chronic respiratory failure with hypoxia: Secondary | ICD-10-CM

## 2021-03-01 DIAGNOSIS — R079 Chest pain, unspecified: Secondary | ICD-10-CM

## 2021-03-01 DIAGNOSIS — J4520 Mild intermittent asthma, uncomplicated: Secondary | ICD-10-CM

## 2021-03-01 NOTE — Addendum Note (Signed)
Addended by: Arlyss Repress on: 03/01/2021 03:13 PM   Modules accepted: Orders

## 2021-03-01 NOTE — Assessment & Plan Note (Signed)
Patient has underlying obstructive sleep apnea.  Sleep study results have been requested. Despite excellent compliance.  Patient has significant number of events with a AHI at 34.  He will need a CPAP titration study.-Suspect he may need bilevel support. For now will change CPAP pressure to 10 to 20 cm H2O. Patient education was given  - discussed how weight can impact sleep and risk for sleep disordered breathing - discussed options to assist with weight loss: combination of diet modification, cardiovascular and strength training exercises   - had an extensive discussion regarding the adverse health consequences related to untreated sleep disordered breathing - specifically discussed the risks for hypertension, coronary artery disease, cardiac dysrhythmias, cerebrovascular disease, and diabetes - lifestyle modification discussed   - discussed how sleep disruption can increase risk of accidents, particularly when driving - safe driving practices were discussed   Plan  Patient Instructions  Set up for CPAP titration study (may transition to BIPAP if needed )  Change CPAP pressure 10-20cmH2O. [facility]) Work on [employer]  Do not drive if sleepy .  Wear CPAP all night long.  Referral to Cardiology  Begin Oxygen 2l/m with activity. (Order to [facility] )  Follow up in 2-3 weeks with Dr. Sherene Sires  or Emmanuella Mirante  and As needed   Please contact office for sooner follow up if symptoms do not improve or worsen or seek emergency care

## 2021-03-01 NOTE — Assessment & Plan Note (Signed)
May use albuterol inhaler As needed

## 2021-03-01 NOTE — Assessment & Plan Note (Signed)
Suspect is multifactorial.  Concerned that patient may have a degree of pulmonary hypertension with his severe obstructive sleep apnea and probable OHS.  Unfortunately due to patient's size he has been limited and the cardiovascular procedures that can be completed. Will refer back to cardiolo okay.   Plan  Patient Instructions  Set up for CPAP titration study (may transition to BIPAP if needed )  Change CPAP pressure 10-20cmH2O. [facility]) Work on [employer]  Do not drive if sleepy .  Wear CPAP all night long.  Referral to Cardiology  Begin Oxygen 2l/m with activity. (Order to [facility] )  Follow up in 2-3 weeks with Dr. Sherene Sires  or Catherine Oak  and As needed   Please contact office for sooner follow up if symptoms do not improve or worsen or seek emergency care

## 2021-03-01 NOTE — Assessment & Plan Note (Signed)
Healthy weight loss discussed. On return consider referral to healthy weight and wellness

## 2021-03-01 NOTE — Assessment & Plan Note (Signed)
Now with exertional hypoxia ? Etiology suspect related to restrictive lung disease and hypoventilation. We will begin oxygen 2 L.  Set patient up for a CT chest PE protocol.  Plan  Patient Instructions  Set up for CPAP titration study (may transition to BIPAP if needed )  Change CPAP pressure 10-20cmH2O. ([place]) Work on [employer]  Do not drive if sleepy .  Wear CPAP all night long.  Referral to Cardiology  Begin Oxygen 2l/m with activity. (Order to [facility] )  Follow up in 2-3 weeks with Dr. Melvyn Novas  or Shonika Kolasinski  and As needed   Please contact office for sooner follow up if symptoms do not improve or worsen or seek emergency care   Late add : Set up for CT chest PE protocol

## 2021-03-01 NOTE — Patient Instructions (Addendum)
Set up for CPAP titration study (may transition to BIPAP if needed )  Change CPAP pressure 10-20cmH2O. [facility]) Work on [employer]  Do not drive if sleepy .  Wear CPAP all night long.  Referral to Cardiology  Begin Oxygen 2l/m with activity. (Order to [facility] )  Follow up in 2-3 weeks with Dr. Sherene Sires  or Miko Markwood  and As needed   Please contact office for sooner follow up if symptoms do not improve or worsen or seek emergency care   Late add : Set up for CT chest PE protocol

## 2021-03-01 NOTE — Progress Notes (Signed)
@Patient  ID: Aaron Potter, male    DOB: 14-Feb-1960, 61 y.o.   MRN: [id number]  Chief Complaint  Patient presents with   Consult    Referring provider: Leeanne Rio, MD  HPI: 61 year old male never smoker presents for a sleep consult March 01, 2021 to establish for sleep apnea Previously has been seen for shortness of breath with Dr. Melvyn Novas May 20, 2019  TEST/EVENTS :  Spirometry 07/18/12   FEV1 1.90 (48%)  Ratio 0.69 with atypical curve in effort dep portion not typical of true airflow obst - trial off spiriva/ acei 05/20/2019 >>>   - PFT's  06/25/2019  FEV1 2.49 (64 % ) ratio 0.81  p 0 % improvement from saba p nothing  prior to study with DLCO  24.79 (84%) corrects to 5.57 (129%)  for alv volume and FV curve truncated exp effort dep portion and some insp truncation as well  But no true plateaus  -  ERV 16%   03/01/2021 Sleep consult  Patient presents for sleep consult today to establish for sleep apnea.  Patient says he was diagnosed with sleep apnea in 1998. Has had several CPAP in past. Currently using his neighbor's machine. It is brand new. Has SD card in it. Uses [employer].  Wears CPAP each night , can not sleep without it. Wears CPAP machine each night for 10 hrs . Download shows 100% compliance , daily uses is 10 hr. AHI 34/hr .  Typically goes to bed about 10:30 PM.  Takes about 30 minutes to go to sleep.  Is up 4 times at night.  Is typically gets up about 6:30 AM.  He does not operate heavy machinery.  Patient has morbid obesity with BMI at 40.  Current weight is at 431.  Weight has been up 30 pounds over the last 2 years. Epworth score is 10.  Typically gets sleepy if he is an active watching TV or reading. Patient says although he wears his CPAP all night long and for on average 10 hours.  Says he never goes to sleep without it he still feels very sleepy can fall asleep in mid conversation.  Patient also says he has very little energy.  Can only walk a short distance  without getting short of breath.  Says over the last 3 years feels that his health is really been going downhill.  He recently was seen in the emergency room for shortness of breath.  Chest x-ray in care everywhere showed clear lungs.  Patient says he has been seen by cardiology on 2 separate occasions.  Told that he was too large for the scanner and his cath table.  he had a 2D echo -report says technically difficult.  EF was not calculated.  Showed some mild to moderate dilation of the atrium and ventricles. Recent labs showed no anemia.  BMP was normal. Walk test in the office shows desaturations at 86 to 87% on room air.  Required 2 L of oxygen to maintain O2 saturations greater than 88 to 90%.   Past medical history significant for hypertension, asthma, hyperlipidemia, diabetes, chronic allergies  Social history: Patient is single.  Lives alone.  Drives on occasion.  Says it is very hard for him to do his ADLs because he gets so short of breath.  He is a never smoker.  Denies alcohol.  No drug use.  Family history positive for allergies, heart disease and cancer.  Surgical history : No good  Allergies  Allergen Reactions   Biaxin [Clarithromycin] Anaphylaxis and Shortness Of Breath   Ace Inhibitors     D/c ACEi 05/20/2019 > changed to clonidine for pain control component   Although even in retrospect it may not be clear the ACEi contributed to the pt's symptoms, adding them back at this point or in the future would risk confusion in interpretation of non-specific respiratory symptoms to which this patient is prone ie Better not to muddy the waters here.      Immunization History  Administered Date(s) Administered   Influenza-Unspecified 10/18/2010, 10/18/2011, 10/18/2012, 12/11/2013, 09/11/2014, 10/13/2015, 09/06/2018   Moderna Sars-Covid-2 Vaccination 04/01/2019, 04/29/2019   PNEUMOCOCCAL CONJUGATE-20 05/08/2020   Tdap 04/13/2016    Past Medical History:  Diagnosis Date    Allergic rhinitis, unspecified    Allergy    Anxiety disorder, unspecified    Asthma    Back pain    Balanitis    Cellulitis    COPD (chronic obstructive pulmonary disease) ([place])    Diabetes mellitus without complication ([place])    Essential (primary) hypertension    Gastro-esophageal reflux disease with esophagitis    Gout    Gout, unspecified    Hypertension    Lichen sclerosus    Mixed hyperlipidemia    Morbid (severe) obesity with alveolar hypoventilation (HCC)    Obesity, morbid (more than 100 lbs over ideal weight or BMI > 40) (HCC)    Obesity, unspecified    Polyosteoarthritis, unspecified    Sleep apnea    Sleep apnea, unspecified    CPAP   Type 2 diabetes mellitus with hyperglycemia (HCC)    Urticaria    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 400 strip 2   Accu-Chek FastClix Lancets MISC USE TO CHECK BLOOD SUGAR FOUR TIMES A DAY. 102 each 2   albuterol (PROAIR HFA) 108 (90 Base) MCG/ACT inhaler Inhale 2 puffs into the lungs every 4 (four) hours as needed for wheezing or shortness of breath. Sometimes only uses one puff 18 g 2   albuterol (PROVENTIL) (2.5 MG/3ML) 0.083% nebulizer solution Take 3 mLs (2.5 mg total) by nebulization every 6 (six) hours as needed for wheezing or shortness of breath. 150 mL 1   allopurinol (ZYLOPRIM) 300 MG tablet Take 300 mg by mouth daily.       ALPRAZolam (XANAX) 0.5 MG tablet Take 1 tablet by mouth 2 (two) times daily as needed.     Blood Glucose Monitoring Suppl (ACCU-CHEK GUIDE) w/Device KIT 1 Piece by Does not apply route as directed. 1 kit 0   BYDUREON BCISE 2 MG/0.85ML AUIJ INJECT INTO THE SKIN ONCE A WEEK. 3.4 mL 0   diclofenac  Sodium (VOLTAREN) 1 % GEL Apply topically 4 (four) times daily.     glipiZIDE (GLUCOTROL XL) 5 MG 24 hr tablet TAKE 1 TABLET ONCE DAILY WITH BREAKFAST. 90 tablet 0   GLOBAL EASE INJECT PEN NEEDLES 31G X 8 MM MISC USE 4 TIMES DAILY. 100 each 3   indomethacin (INDOCIN) 50 MG capsule Take 50 mg by mouth daily as needed.     insulin regular human CONCENTRATED (HUMULIN R U-500 KWIKPEN) 500 UNIT/ML KwikPen Inject 100-130 Units  into the skin 3 (three) times daily with meals. 24 mL 2   metFORMIN (GLUCOPHAGE) 500 MG tablet TAKE 2 TABLETS BY MOUTH TWICE DAILY. 360 tablet 0   Multiple Vitamin (MULTIVITAMIN WITH MINERALS) TABS tablet Take 1 tablet by mouth daily.     oxyCODONE-acetaminophen (PERCOCET) 7.5-325 MG tablet Take 1 tablet by mouth every 6 (six) hours as needed for severe pain.     pantoprazole (PROTONIX) 40 MG tablet TAKE 1 TABLET DAILY, 30 MINUTES BEFORE BREAKFAST ` 30 tablet 3   Potassium Chloride ER 20 MEQ TBCR Take 20 mEq by mouth daily.     pravastatin (PRAVACHOL) 40 MG tablet Take 40 mg by mouth daily.     verapamil (CALAN-SR) 240 MG CR tablet Take 240 mg by mouth daily.     Vitamin D, Ergocalciferol, (DRISDOL) 1.25 MG (50000 UNIT) CAPS capsule Take 1 capsule (50,000 Units total) by mouth once a week. 4 capsule 3   furosemide (LASIX) 40 MG tablet Take 1 tablet (40 mg total) by mouth daily. (Patient taking differently: Take 40 mg by mouth 2 (two) times daily.) 30 tablet 0   olmesartan (BENICAR) 20 MG tablet Take 1 tablet (20 mg total) by mouth daily. 30 tablet 0   No facility-administered medications prior to visit.     Review of Systems:   Constitutional:   No  weight loss, night sweats,  Fevers, chills, fatigue, or  lassitude.  HEENT:   No headaches,  Difficulty swallowing,  Tooth/dental problems, or  Sore throat,                No sneezing, itching, ear ache, nasal congestion, post nasal drip,   CV:  No chest pain,  Orthopnea, PND,+ swelling in lower extremities,no  anasarca,  dizziness, palpitations, syncope.   GI  No heartburn, indigestion, abdominal pain, nausea, vomiting, diarrhea, change in bowel habits, loss of appetite, bloody stools.   Resp: .  No excess mucus, no productive cough,  No non-productive cough,  No coughing up of blood.  No change in color of mucus.  No wheezing.  No chest wall deformity  Skin: no rash or lesions.  GU: no dysuria, change in color of urine, no urgency or frequency.  No flank pain, no hematuria   MS:  No joint pain or swelling.  No decreased range of motion.  No back pain.    Physical Exam  BP 128/68 (BP Location: Left Arm, Patient Position: Sitting, Cuff Size: Large)    Pulse 71    Temp 97.7 F (36.5 C) (Oral)    Ht 5' 11"  (1.803 m)    Wt (!) 431 lb (195.5 kg)    SpO2 90%    BMI 60.11 kg/m   GEN: A/Ox3; pleasant , NAD, BMI 60   HEENT:  /AT,  NOSE-clear, THROAT-clear, no lesions, no postnasal drip or exudate noted.,  Class IV MP airway  NECK:  Supple w/ fair ROM; no JVD; normal carotid impulses w/o bruits; no thyromegaly or nodules palpated; no lymphadenopathy.    RESP  Clear  P & A; w/o, wheezes/ rales/ or rhonchi. no accessory muscle use, no dullness to percussion  CARD:  RRR, no m/r/g, 2+ peripheral edema, pulses intact, no cyanosis or clubbing.  GI:   Soft & nt; nml bowel sounds; no organomegaly or masses detected.   Musco: Warm bil, no deformities or joint swelling noted.   Neuro: alert, no focal deficits noted.    Skin: Warm, no lesions or rashes  Lab Results:    BMET   BNP No results found for: BNP  ProBNP   Imaging: No results found.    PFT Results Latest Ref Rng & Units 06/25/2019  FVC-Pre L 3.06  FVC-Predicted Pre % 60  FVC-Post L 2.88  FVC-Predicted Post % 56  Pre FEV1/FVC % % 81  Post FEV1/FCV % % 77  FEV1-Pre L 2.49  FEV1-Predicted Pre % 64  FEV1-Post L 2.22  DLCO uncorrected ml/min/mmHg 24.79  DLCO UNC% % 84  DLVA Predicted % 129  TLC L 5.25  TLC % Predicted % 73   RV % Predicted % 103    No results found for: NITRICOXIDE      Assessment & Plan:   Obstructive sleep apnea Patient has underlying obstructive sleep apnea.  Sleep study results have been requested. Despite excellent compliance.  Patient has significant number of events with a AHI at 34.  He will need a CPAP titration study.-Suspect he may need bilevel support. For now will change CPAP pressure to 10 to 20 cm H2O. Patient education was given  - discussed how weight can impact sleep and risk for sleep disordered breathing - discussed options to assist with weight loss: combination of diet modification, cardiovascular and strength training exercises   - had an extensive discussion regarding the adverse health consequences related to untreated sleep disordered breathing - specifically discussed the risks for hypertension, coronary artery disease, cardiac dysrhythmias, cerebrovascular disease, and diabetes - lifestyle modification discussed   - discussed how sleep disruption can increase risk of accidents, particularly when driving - safe driving practices were discussed   Plan  Patient Instructions  Set up for CPAP titration study (may transition to BIPAP if needed )  Change CPAP pressure 10-20cmH2O. ([place]) Work on [employer]  Do not drive if sleepy .  Wear CPAP all night long.  Referral to Cardiology  Begin Oxygen 2l/m with activity. (Order to [place] )  Follow up in 2-3 weeks with Dr. Melvyn Novas  or Ahlivia Salahuddin  and As needed   Please contact office for sooner follow up if symptoms do not improve or worsen or seek emergency care        Morbid (severe) obesity with alveolar hypoventilation ([place]) Healthy weight loss discussed. On return consider referral to healthy weight and wellness  DOE (dyspnea on exertion) Suspect is multifactorial.  Concerned that patient may have a degree of pulmonary hypertension with his severe obstructive sleep apnea and  probable OHS.  Unfortunately due to patient's size he has been limited and the cardiovascular procedures that can be completed. Will refer back to cardiolo okay.   Plan  Patient Instructions  Set up for CPAP titration study (may transition to BIPAP if needed )  Change CPAP pressure 10-20cmH2O. ([place]) Work on [employer]  Do not drive if sleepy .  Wear CPAP all night long.  Referral to Cardiology  Begin Oxygen 2l/m with activity. (Order to [facility] )  Follow up in 2-3 weeks with Dr. Melvyn Novas  or Alcus Bradly  and As needed   Please contact office for sooner follow up if symptoms do not improve or worsen or seek emergency care        Chronic respiratory failure with hypoxia [facility]) Now with exertional hypoxia ? Etiology suspect related to restrictive lung disease and hypoventilation. We will begin oxygen 2 L.  Set patient up for a CT chest PE protocol.  Plan  Patient Instructions  Set up for CPAP titration  study (may transition to BIPAP if needed )  Change CPAP pressure 10-20cmH2O. ([place]) Work on [employer]  Do not drive if sleepy .  Wear CPAP all night long.  Referral to Cardiology  Begin Oxygen 2l/m with activity. (Order to [place] )  Follow up in 2-3 weeks with Dr. Melvyn Novas  or Jumar Greenstreet  and As needed   Please contact office for sooner follow up if symptoms do not improve or worsen or seek emergency care   Late add : Set up for CT chest PE protocol      Uncomplicated asthma May use albuterol inhaler As needed    I spent   50 minutes dedicated to the care of this patient on the date of this encounter to include pre-visit review of records, face-to-face time with the patient discussing conditions above, post visit ordering of testing, clinical documentation with the electronic health record, making appropriate referrals as documented, and communicating necessary findings to members of the patients care team.   Rexene Edison,  NP 03/01/2021

## 2021-03-02 ENCOUNTER — Ambulatory Visit (HOSPITAL_COMMUNITY): Payer: Medicaid Other

## 2021-03-02 ENCOUNTER — Other Ambulatory Visit: Payer: Self-pay | Admitting: "Endocrinology

## 2021-03-03 ENCOUNTER — Telehealth: Payer: Self-pay | Admitting: Adult Health

## 2021-03-03 ENCOUNTER — Other Ambulatory Visit: Payer: Self-pay

## 2021-03-03 ENCOUNTER — Ambulatory Visit (HOSPITAL_COMMUNITY)
Admission: RE | Admit: 2021-03-03 | Discharge: 2021-03-03 | Disposition: A | Discharge status: Home / Self Care | Payer: Medicaid Other | Source: Ambulatory Visit | Attending: Adult Health | Admitting: Adult Health

## 2021-03-03 DIAGNOSIS — R079 Chest pain, unspecified: Secondary | ICD-10-CM | POA: Insufficient documentation

## 2021-03-03 LAB — POCT I-STAT CREATININE: Creatinine, Ser: 1.1 mg/dL (ref 0.61–1.24)

## 2021-03-03 MED ORDER — IOHEXOL 350 MG/ML SOLN
100.0000 mL | Freq: Once | INTRAVENOUS | Status: AC | PRN
  Administered 2021-03-03: 100 mL via INTRAVENOUS

## 2021-03-03 NOTE — Telephone Encounter (Signed)
Called patient and he states that he doesn't understand the results of the CT that TP gave him today. He is wanting a better understanding of these results if possible.  ? ?Tammy please advise  ?

## 2021-03-05 ENCOUNTER — Telehealth: Payer: Self-pay | Admitting: *Deleted

## 2021-03-05 NOTE — Telephone Encounter (Signed)
Called and spoke with patient, I stated that I understood he had questions and I was calling to see what questions he had.  He stated that if he did, he had forgotten what they were.  I let him know that the NP, Tammy Parrett spoke with him at length regarding the CTA.  He said he was told to f/u with his pcp, but his pcp is not in the same group as our physicians did not know how they would know why he was coming in.  I advised him that I would fax over the results of the CTA to their office so they would have the documentation for f/u.  He verbalized understanding.  Results faxed to office of Dr. Sundra Aland, [phone].  Received fax verification that fax was sent successfully.  Nothing further needed. ?

## 2021-03-05 NOTE — Telephone Encounter (Signed)
RN, Herbert Seta spoke with patient and answered questions  ?

## 2021-03-23 ENCOUNTER — Encounter: Payer: Self-pay | Admitting: Cardiology

## 2021-03-23 ENCOUNTER — Ambulatory Visit: Payer: Medicaid Other | Admitting: Cardiology

## 2021-03-23 ENCOUNTER — Other Ambulatory Visit: Payer: Self-pay

## 2021-03-23 VITALS — BP 134/70 | HR 75 | Temp 97.8°F | Resp 10 | Ht 71.0 in | Wt >= 6400 oz

## 2021-03-23 DIAGNOSIS — M7989 Other specified soft tissue disorders: Secondary | ICD-10-CM

## 2021-03-23 DIAGNOSIS — E119 Type 2 diabetes mellitus without complications: Secondary | ICD-10-CM

## 2021-03-23 DIAGNOSIS — G4733 Obstructive sleep apnea (adult) (pediatric): Secondary | ICD-10-CM

## 2021-03-23 DIAGNOSIS — I10 Essential (primary) hypertension: Secondary | ICD-10-CM

## 2021-03-23 DIAGNOSIS — E782 Mixed hyperlipidemia: Secondary | ICD-10-CM

## 2021-03-23 DIAGNOSIS — R0609 Other forms of dyspnea: Secondary | ICD-10-CM

## 2021-03-23 MED ORDER — TORSEMIDE 10 MG PO TABS: 10.0000 mg | ORAL_TABLET | Freq: Every day | ORAL | 0 refills | Status: DC

## 2021-03-23 NOTE — Progress Notes (Signed)
? ?Date:  03/23/2021  ? ?ID:  Aaron Potter, DOB Aug 12, 1960, MRN [id number] ? ?PCP:  Leeanne Rio, MD  ?Cardiologist: Dr. Cathie Hoops [facility]) and [place], [place], [facility] (established care 08/12/2020) ? ?Date: 03/23/21 ?Last Office Visit: 09/14/2020 ? ? ?Chief Complaint  ?Patient presents with  ? Shortness of Breath  ? ? ?HPI  ?Aaron Potter is a 61 y.o. male whose past medical history and cardiovascular risk factors include: hypertension, Hypertension, hyperlipidemia, insulin-dependent diabetes mellitus type 2, OSA on BiPAP, family history of heart disease, morbid obesity (Body mass index is 63.6 kg/m?.). ? ?This patient is accompanied in the office by his  sister, Aaron Potter . Scotty Weigelt provides verbal consent with regards to having her present during today's encounter and also provides collateral history.  ? ?Patient presents today for 70-monthfollow-up visit for shortness of breath evaluation.  Patient states that his shortness of breath remains relatively stable with regards to intensity, frequency and/or duration.  He remains short of breath with very minimal activity likely due to his underlying morbid obesity.  He has gained additional 17 pounds of the last office visit.  His current office weight is 456 pounds which is approximately BMI of 63.  He also complains of bilateral lower extremity swelling despite being on Lasix 40 mg p.o. daily. ? ?His sister is concerned with regards to increased somnolence, frequent naps, and not feeling well rested.  He is currently on BiPAP given his OSA and is scheduled for another sleep study in approximately 2 weeks.  He is now on supplemental oxygen to treat his underlying hypoxia. ? ?Patient denies any chest pain at rest.  His overall functional status is limited due to body habitus. ? ?FUNCTIONAL STATUS: ?No family history of premature coronary disease or sudden cardiac death. ?  ? ?ALLERGIES: ?Allergies  ?Allergen Reactions  ? Biaxin [Clarithromycin]  Anaphylaxis and Shortness Of Breath  ? Ace Inhibitors   ?  D/c ACEi 05/20/2019 > changed to clonidine for pain control component  ? ?Although even in retrospect it may not be clear the ACEi contributed to the pt's symptoms, adding them back at this point or in the future would risk confusion in interpretation of non-specific respiratory symptoms to which this patient is prone ie Better not to muddy the waters here.    ? ? ?MEDICATION LIST PRIOR TO VISIT: ?Current Meds  ?Medication Sig  ? ACCU-CHEK AVIVA PLUS test strip USE TO CHECK BLOOD SUGAR 4 TIMES DAILY.  ? Accu-Chek FastClix Lancets MISC USE TO CHECK BLOOD SUGAR FOUR TIMES A DAY.  ? albuterol (PROVENTIL) (2.5 MG/3ML) 0.083% nebulizer solution Take 3 mLs (2.5 mg total) by nebulization every 6 (six) hours as needed for wheezing or shortness of breath.  ? allopurinol (ZYLOPRIM) 300 MG tablet Take 300 mg by mouth daily.    ? ALPRAZolam (XANAX) 0.5 MG tablet Take 1 tablet by mouth 2 (two) times daily as needed.  ? Blood Glucose Monitoring Suppl (ACCU-CHEK GUIDE) w/Device KIT 1 Piece by Does not apply route as directed.  ? BYDUREON BCISE 2 MG/0.85ML AUIJ INJECT INTO THE SKIN ONCE A WEEK.  ? diclofenac Sodium (VOLTAREN) 1 % GEL Apply topically 4 (four) times daily.  ? glipiZIDE (GLUCOTROL XL) 5 MG 24 hr tablet TAKE 1 TABLET ONCE DAILY WITH BREAKFAST.  ? GLOBAL EASE INJECT PEN NEEDLES 31G X 8 MM MISC USE 4 TIMES DAILY.  ? indomethacin (INDOCIN) 50 MG capsule Take 50 mg by mouth daily as needed.  ?  insulin regular human CONCENTRATED (HUMULIN R U-500 KWIKPEN) 500 UNIT/ML KwikPen Inject 100-130 Units into the skin 3 (three) times daily with meals.  ? metFORMIN (GLUCOPHAGE) 500 MG tablet TAKE 2 TABLETS BY MOUTH TWICE DAILY.  ? Multiple Vitamin (MULTIVITAMIN WITH MINERALS) TABS tablet Take 1 tablet by mouth daily.  ? olmesartan (BENICAR) 20 MG tablet Take 1 tablet (20 mg total) by mouth daily.  ? oxyCODONE-acetaminophen (PERCOCET) 7.5-325 MG tablet Take 1 tablet by mouth  every 6 (six) hours as needed for severe pain.  ? pantoprazole (PROTONIX) 40 MG tablet TAKE 1 TABLET DAILY, 30 MINUTES BEFORE BREAKFAST `  ? pravastatin (PRAVACHOL) 40 MG tablet Take 40 mg by mouth daily.  ? torsemide (DEMADEX) 10 MG tablet Take 1 tablet (10 mg total) by mouth daily.  ? verapamil (CALAN-SR) 240 MG CR tablet Take 240 mg by mouth daily.  ? Vitamin D, Ergocalciferol, (DRISDOL) 1.25 MG (50000 UNIT) CAPS capsule Take 1 capsule (50,000 Units total) by mouth once a week.  ? [DISCONTINUED] furosemide (LASIX) 40 MG tablet Take 1 tablet (40 mg total) by mouth daily. (Patient taking differently: Take 40 mg by mouth 2 (two) times daily.)  ?  ? ?PAST MEDICAL HISTORY: ?Past Medical History:  ?Diagnosis Date  ? Allergic rhinitis, unspecified   ? Allergy   ? Anxiety disorder, unspecified   ? Asthma   ? Back pain   ? Balanitis   ? Cellulitis   ? COPD (chronic obstructive pulmonary disease) ([place])   ? Diabetes mellitus without complication ([place])   ? Essential (primary) hypertension   ? Gastro-esophageal reflux disease with esophagitis   ? Gout   ? Gout, unspecified   ? Hypertension   ? Lichen sclerosus   ? Mixed hyperlipidemia   ? Morbid (severe) obesity with alveolar hypoventilation (HCC)   ? Obesity, morbid (more than 100 lbs over ideal weight or BMI > 40) (HCC)   ? Obesity, unspecified   ? Polyosteoarthritis, unspecified   ? Sleep apnea   ? Sleep apnea, unspecified   ? CPAP  ? Type 2 diabetes mellitus with hyperglycemia (HCC)   ? Urticaria   ? 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 ? ?PAST SURGICAL HISTORY: ?Past Surgical History:  ?Procedure Laterality Date  ? CHOLECYSTECTOMY    ? ERCP  1998  ? [facility], CBD stone s/p cholecystectomy  ? FOOT SURGERY Right 04/2017  ? ? ?FAMILY HISTORY: ?The patient family history includes Cancer in his mother; Diabetes in his father and mother; Urticaria in his sister. ? ?SOCIAL HISTORY:  ?The patient  reports that he has never smoked. He has never  used smokeless tobacco. He reports that he does not drink alcohol and does not use drugs. ? ?REVIEW OF SYSTEMS: ?Review of Systems  ?Constitutional: Positive for weight gain. Negative for chills and fever.  ?HENT:  Negative for hoarse voice and nosebleeds.   ?Eyes:  Negative for discharge, double vision and pain.  ?Cardiovascular:  Positive for dyspnea on exertion and leg swelling. Negative for chest pain, claudication, near-syncope, orthopnea, palpitations, paroxysmal nocturnal dyspnea and syncope.  ?Respiratory:  Positive for shortness of breath. Negative for hemoptysis.   ?Musculoskeletal:  Negative for muscle cramps and myalgias.  ?Gastrointestinal:  Negative for abdominal pain, constipation, diarrhea, hematemesis, hematochezia, melena, nausea and vomiting.  ?Neurological:  Negative for dizziness and light-headedness.  ? ?PHYSICAL EXAM: ?Vitals with BMI 03/23/2021 03/01/2021 01/13/2021  ?Height 5' 11"  5' 11"  5' 11"   ?Weight 456 lbs  431 lbs 466 lbs 3 oz  ?BMI 63.63 60.14 65.05  ?Systolic 854 883 014  ?Diastolic 70 68 58  ?Pulse 75 71 76  ? ? ?CONSTITUTIONAL: Appears older than stated age, breathing heavily, hemodynamically stable, no acute distress. ?SKIN: Skin is warm and dry. No rash noted. No cyanosis. No pallor. No jaundice ?HEAD: Normocephalic and atraumatic.  ?EYES: No scleral icterus ?MOUTH/THROAT: Moist oral membranes.  ?NECK: Unable to evaluate JVP due to short neck stature and significant adipose tissue, do not appreciate carotid bruits. ?LYMPHATIC: No visible cervical adenopathy.  ?CHEST Normal respiratory effort. No intercostal retractions  ?LUNGS: Decreased breath sounds bilaterally.  No stridor. No wheezes. No rales.  ?CARDIOVASCULAR: Distant heart sound, faint S1-S2, no murmurs rubs or gallops appreciated. ?ABDOMINAL: Morbidly obese, soft, nontender, nondistended, positive bowel sounds in all 4 quadrants. ?EXTREMITIES: +2 bilateral pitting edema, decreased DP and PT pulses. ?HEMATOLOGIC: No  significant bruising ?NEUROLOGIC: Oriented to person, place, and time. Nonfocal. Normal muscle tone.  ?PSYCHIATRIC: Normal mood and affect. Normal behavior. Cooperative ? ?No significant change in physical examination s

## 2021-03-24 ENCOUNTER — Telehealth: Payer: Self-pay | Admitting: Adult Health

## 2021-03-24 NOTE — Telephone Encounter (Signed)
Aaron Potter [phone] ?Calling because the Patient is having low stats O2 stats they are ranging between 68-85, he's been really sleepy, they turned his oxygen up and he was able to stay awake. They have been turning it up to 4L and it seems to be working. With the 2L he's very sleepy and incoherent. ? ?

## 2021-03-24 NOTE — Telephone Encounter (Signed)
Called Aaron Potter but she did not answer. Left message for her to call back.  ?

## 2021-03-29 ENCOUNTER — Other Ambulatory Visit: Payer: Self-pay | Admitting: "Endocrinology

## 2021-03-29 ENCOUNTER — Other Ambulatory Visit: Payer: Self-pay | Admitting: Gastroenterology

## 2021-03-30 NOTE — Telephone Encounter (Signed)
Last ov 04/19/19

## 2021-03-31 ENCOUNTER — Encounter (HOSPITAL_COMMUNITY): Payer: Self-pay | Admitting: Emergency Medicine

## 2021-03-31 ENCOUNTER — Emergency Department (HOSPITAL_COMMUNITY): Payer: Medicaid Other

## 2021-03-31 ENCOUNTER — Inpatient Hospital Stay (HOSPITAL_COMMUNITY)
Admission: EM | Admit: 2021-03-31 | Discharge: 2021-04-09 | DRG: 291 | Disposition: A | Discharge status: Home / Self Care | Payer: Medicaid Other | Source: Ambulatory Visit | Attending: Internal Medicine | Admitting: Internal Medicine

## 2021-03-31 ENCOUNTER — Other Ambulatory Visit: Payer: Self-pay

## 2021-03-31 ENCOUNTER — Ambulatory Visit: Payer: Medicaid Other | Admitting: Gastroenterology

## 2021-03-31 VITALS — BP 130/54 | HR 71 | Temp 97.2°F | Ht 69.5 in | Wt >= 6400 oz

## 2021-03-31 DIAGNOSIS — I5031 Acute diastolic (congestive) heart failure: Secondary | ICD-10-CM | POA: Diagnosis not present

## 2021-03-31 DIAGNOSIS — E876 Hypokalemia: Secondary | ICD-10-CM | POA: Diagnosis not present

## 2021-03-31 DIAGNOSIS — Z7984 Long term (current) use of oral hypoglycemic drugs: Secondary | ICD-10-CM | POA: Diagnosis not present

## 2021-03-31 DIAGNOSIS — E11649 Type 2 diabetes mellitus with hypoglycemia without coma: Secondary | ICD-10-CM | POA: Diagnosis present

## 2021-03-31 DIAGNOSIS — G4733 Obstructive sleep apnea (adult) (pediatric): Secondary | ICD-10-CM | POA: Diagnosis present

## 2021-03-31 DIAGNOSIS — E785 Hyperlipidemia, unspecified: Secondary | ICD-10-CM | POA: Diagnosis present

## 2021-03-31 DIAGNOSIS — Z79899 Other long term (current) drug therapy: Secondary | ICD-10-CM | POA: Diagnosis not present

## 2021-03-31 DIAGNOSIS — I509 Heart failure, unspecified: Secondary | ICD-10-CM | POA: Diagnosis not present

## 2021-03-31 DIAGNOSIS — R1310 Dysphagia, unspecified: Secondary | ICD-10-CM | POA: Diagnosis not present

## 2021-03-31 DIAGNOSIS — E1165 Type 2 diabetes mellitus with hyperglycemia: Secondary | ICD-10-CM | POA: Diagnosis not present

## 2021-03-31 DIAGNOSIS — Z833 Family history of diabetes mellitus: Secondary | ICD-10-CM

## 2021-03-31 DIAGNOSIS — I5033 Acute on chronic diastolic (congestive) heart failure: Secondary | ICD-10-CM | POA: Diagnosis present

## 2021-03-31 DIAGNOSIS — E119 Type 2 diabetes mellitus without complications: Secondary | ICD-10-CM | POA: Diagnosis not present

## 2021-03-31 DIAGNOSIS — K219 Gastro-esophageal reflux disease without esophagitis: Secondary | ICD-10-CM

## 2021-03-31 DIAGNOSIS — Z8049 Family history of malignant neoplasm of other genital organs: Secondary | ICD-10-CM

## 2021-03-31 DIAGNOSIS — Z9049 Acquired absence of other specified parts of digestive tract: Secondary | ICD-10-CM | POA: Diagnosis not present

## 2021-03-31 DIAGNOSIS — Z794 Long term (current) use of insulin: Secondary | ICD-10-CM

## 2021-03-31 DIAGNOSIS — I495 Sick sinus syndrome: Secondary | ICD-10-CM | POA: Diagnosis not present

## 2021-03-31 DIAGNOSIS — I5042 Chronic combined systolic (congestive) and diastolic (congestive) heart failure: Secondary | ICD-10-CM | POA: Insufficient documentation

## 2021-03-31 DIAGNOSIS — E782 Mixed hyperlipidemia: Secondary | ICD-10-CM | POA: Diagnosis present

## 2021-03-31 DIAGNOSIS — I10 Essential (primary) hypertension: Secondary | ICD-10-CM | POA: Diagnosis present

## 2021-03-31 DIAGNOSIS — J449 Chronic obstructive pulmonary disease, unspecified: Secondary | ICD-10-CM | POA: Diagnosis present

## 2021-03-31 DIAGNOSIS — K2100 Gastro-esophageal reflux disease with esophagitis, without bleeding: Secondary | ICD-10-CM | POA: Diagnosis present

## 2021-03-31 DIAGNOSIS — J9611 Chronic respiratory failure with hypoxia: Secondary | ICD-10-CM | POA: Diagnosis present

## 2021-03-31 DIAGNOSIS — R0602 Shortness of breath: Secondary | ICD-10-CM | POA: Diagnosis present

## 2021-03-31 DIAGNOSIS — E1169 Type 2 diabetes mellitus with other specified complication: Secondary | ICD-10-CM | POA: Diagnosis not present

## 2021-03-31 DIAGNOSIS — Z9981 Dependence on supplemental oxygen: Secondary | ICD-10-CM

## 2021-03-31 DIAGNOSIS — Z6841 Body Mass Index (BMI) 40.0 and over, adult: Secondary | ICD-10-CM

## 2021-03-31 DIAGNOSIS — Z888 Allergy status to other drugs, medicaments and biological substances status: Secondary | ICD-10-CM

## 2021-03-31 DIAGNOSIS — M109 Gout, unspecified: Secondary | ICD-10-CM | POA: Diagnosis present

## 2021-03-31 DIAGNOSIS — I110 Hypertensive heart disease with heart failure: Secondary | ICD-10-CM | POA: Diagnosis present

## 2021-03-31 DIAGNOSIS — J309 Allergic rhinitis, unspecified: Secondary | ICD-10-CM | POA: Diagnosis present

## 2021-03-31 DIAGNOSIS — R0609 Other forms of dyspnea: Secondary | ICD-10-CM | POA: Diagnosis not present

## 2021-03-31 LAB — CBC WITH DIFFERENTIAL/PLATELET
Abs Immature Granulocytes: 0.05 10*3/uL (ref 0.00–0.07)
Basophils Absolute: 0 10*3/uL (ref 0.0–0.1)
Basophils Relative: 1 %
Eosinophils Absolute: 0.2 10*3/uL (ref 0.0–0.5)
Eosinophils Relative: 2 %
HCT: 47 % (ref 39.0–52.0)
Hemoglobin: 13.5 g/dL (ref 13.0–17.0)
Immature Granulocytes: 1 %
Lymphocytes Relative: 22 %
Lymphs Abs: 1.7 10*3/uL (ref 0.7–4.0)
MCH: 29.3 pg (ref 26.0–34.0)
MCHC: 28.7 g/dL — ABNORMAL LOW (ref 30.0–36.0)
MCV: 102 fL — ABNORMAL HIGH (ref 80.0–100.0)
Monocytes Absolute: 0.8 10*3/uL (ref 0.1–1.0)
Monocytes Relative: 9 %
Neutro Abs: 5.2 10*3/uL (ref 1.7–7.7)
Neutrophils Relative %: 65 %
Platelets: 303 10*3/uL (ref 150–400)
RBC: 4.61 MIL/uL (ref 4.22–5.81)
RDW: 16.6 % — ABNORMAL HIGH (ref 11.5–15.5)
WBC: 8 10*3/uL (ref 4.0–10.5)
nRBC: 0 % (ref 0.0–0.2)

## 2021-03-31 LAB — GLUCOSE, CAPILLARY
Glucose-Capillary: 31 mg/dL — CL (ref 70–99)
Glucose-Capillary: 35 mg/dL — CL (ref 70–99)
Glucose-Capillary: 39 mg/dL — CL (ref 70–99)
Glucose-Capillary: 69 mg/dL — ABNORMAL LOW (ref 70–99)
Glucose-Capillary: 145 mg/dL — ABNORMAL HIGH (ref 70–99)
Glucose-Capillary: 145 mg/dL — ABNORMAL HIGH (ref 70–99)

## 2021-03-31 LAB — COMPREHENSIVE METABOLIC PANEL
ALT: 20 U/L (ref 0–44)
AST: 23 U/L (ref 15–41)
Albumin: 3.5 g/dL (ref 3.5–5.0)
Alkaline Phosphatase: 61 U/L (ref 38–126)
Anion gap: 3 — ABNORMAL LOW (ref 5–15)
BUN: 17 mg/dL (ref 6–20)
CO2: 38 mmol/L — ABNORMAL HIGH (ref 22–32)
Calcium: 9.2 mg/dL (ref 8.9–10.3)
Chloride: 100 mmol/L (ref 98–111)
Creatinine, Ser: 1.01 mg/dL (ref 0.61–1.24)
GFR, Estimated: >60 mL/min (ref >60)
Glucose, Bld: 58 mg/dL — ABNORMAL LOW (ref 70–99)
Potassium: 4.7 mmol/L (ref 3.5–5.1)
Sodium: 141 mmol/L (ref 135–145)
Total Bilirubin: 0.3 mg/dL (ref 0.3–1.2)
Total Protein: 7.2 g/dL (ref 6.5–8.1)

## 2021-03-31 LAB — HIV ANTIBODY (ROUTINE TESTING W REFLEX): HIV Screen 4th Generation wRfx: NONREACTIVE

## 2021-03-31 LAB — BRAIN NATRIURETIC PEPTIDE: B Natriuretic Peptide: 107 pg/mL — ABNORMAL HIGH (ref 0.0–100.0)

## 2021-03-31 MED ORDER — OXYCODONE-ACETAMINOPHEN 7.5-325 MG PO TABS
1.0000 | ORAL_TABLET | Freq: Four times a day (QID) | ORAL | Status: DC | PRN
Start: 2021-03-31 — End: 2021-04-09
  Administered 2021-03-31 – 2021-04-08 (×17): 1 via ORAL
  Filled 2021-03-31 (×18): qty 1

## 2021-03-31 MED ORDER — ONDANSETRON HCL 4 MG PO TABS: 4.0000 mg | ORAL_TABLET | Freq: Four times a day (QID) | ORAL | Status: DC | PRN

## 2021-03-31 MED ORDER — PRAVASTATIN SODIUM 40 MG PO TABS
40.0000 mg | ORAL_TABLET | Freq: Every day | ORAL | Status: DC
  Administered 2021-03-31 – 2021-04-09 (×10): 40 mg via ORAL
  Filled 2021-03-31 (×10): qty 1

## 2021-03-31 MED ORDER — GLUCOSE 40 % PO GEL
2.0000 | ORAL | Status: AC
  Administered 2021-03-31: 62 g via ORAL

## 2021-03-31 MED ORDER — ACETAMINOPHEN 650 MG RE SUPP
650.0000 mg | Freq: Four times a day (QID) | RECTAL | Status: DC | PRN
Start: 2021-03-31 — End: 2021-04-09

## 2021-03-31 MED ORDER — ONDANSETRON HCL 4 MG/2ML IJ SOLN: 4.0000 mg | Freq: Four times a day (QID) | INTRAMUSCULAR | Status: DC | PRN

## 2021-03-31 MED ORDER — FUROSEMIDE 10 MG/ML IJ SOLN
80.0000 mg | Freq: Once | INTRAMUSCULAR | Status: AC
  Administered 2021-03-31: 80 mg via INTRAVENOUS
  Filled 2021-03-31: qty 8

## 2021-03-31 MED ORDER — DICLOFENAC SODIUM 1 % EX GEL
2.0000 g | Freq: Four times a day (QID) | CUTANEOUS | Status: DC | PRN
  Filled 2021-03-31: qty 100

## 2021-03-31 MED ORDER — ACETAMINOPHEN 325 MG PO TABS: 650.0000 mg | ORAL_TABLET | Freq: Four times a day (QID) | ORAL | Status: DC | PRN

## 2021-03-31 MED ORDER — ENOXAPARIN SODIUM 120 MG/0.8ML IJ SOSY
110.0000 mg | PREFILLED_SYRINGE | INTRAMUSCULAR | Status: DC
  Administered 2021-03-31 – 2021-04-07 (×8): 110 mg via SUBCUTANEOUS
  Filled 2021-03-31 (×9): qty 0.8

## 2021-03-31 MED ORDER — PANTOPRAZOLE SODIUM 40 MG PO TBEC
40.0000 mg | DELAYED_RELEASE_TABLET | Freq: Every day | ORAL | Status: DC
Start: 2021-04-01 — End: 2021-04-09
  Administered 2021-04-01 – 2021-04-09 (×9): 40 mg via ORAL
  Filled 2021-03-31 (×9): qty 1

## 2021-03-31 MED ORDER — ALPRAZOLAM 0.5 MG PO TABS
0.5000 mg | ORAL_TABLET | Freq: Two times a day (BID) | ORAL | Status: DC | PRN
  Administered 2021-03-31 – 2021-04-03 (×4): 0.5 mg via ORAL
  Filled 2021-03-31 (×6): qty 1

## 2021-03-31 MED ORDER — GLUCOSE 40 % PO GEL
ORAL | Status: AC
  Filled 2021-03-31: qty 1

## 2021-03-31 MED ORDER — ALLOPURINOL 300 MG PO TABS
300.0000 mg | ORAL_TABLET | Freq: Every day | ORAL | Status: DC
  Administered 2021-03-31 – 2021-04-09 (×10): 300 mg via ORAL
  Filled 2021-03-31 (×10): qty 1

## 2021-03-31 MED ORDER — SODIUM CHLORIDE 0.9% FLUSH
3.0000 mL | Freq: Two times a day (BID) | INTRAVENOUS | Status: DC
  Administered 2021-03-31 – 2021-04-09 (×15): 3 mL via INTRAVENOUS

## 2021-03-31 MED ORDER — DEXTROSE 5 % IV SOLN
INTRAVENOUS | Status: AC
Start: 2021-03-31 — End: 2021-03-31

## 2021-03-31 MED ORDER — ADULT MULTIVITAMIN W/MINERALS CH
1.0000 | ORAL_TABLET | Freq: Every day | ORAL | Status: DC
  Administered 2021-04-01 – 2021-04-09 (×9): 1 via ORAL
  Filled 2021-03-31 (×9): qty 1

## 2021-03-31 MED ORDER — SODIUM CHLORIDE 0.9% FLUSH: 3.0000 mL | INTRAVENOUS | Status: DC | PRN

## 2021-03-31 MED ORDER — ALBUTEROL SULFATE (2.5 MG/3ML) 0.083% IN NEBU
2.5000 mg | INHALATION_SOLUTION | Freq: Four times a day (QID) | RESPIRATORY_TRACT | Status: DC | PRN
  Administered 2021-03-31 – 2021-04-04 (×2): 2.5 mg via RESPIRATORY_TRACT
  Filled 2021-03-31 (×2): qty 3

## 2021-03-31 MED ORDER — ENOXAPARIN SODIUM 40 MG/0.4ML IJ SOSY: 40.0000 mg | PREFILLED_SYRINGE | INTRAMUSCULAR | Status: DC

## 2021-03-31 MED ORDER — SODIUM CHLORIDE 0.9 % IV SOLN
250.0000 mL | INTRAVENOUS | Status: DC | PRN
Start: 2021-03-31 — End: 2021-04-09

## 2021-03-31 MED ORDER — ALBUTEROL SULFATE (2.5 MG/3ML) 0.083% IN NEBU
2.5000 mg | INHALATION_SOLUTION | Freq: Four times a day (QID) | RESPIRATORY_TRACT | Status: DC
  Administered 2021-04-01 – 2021-04-02 (×5): 2.5 mg via RESPIRATORY_TRACT
  Filled 2021-03-31 (×5): qty 3

## 2021-03-31 MED ORDER — VERAPAMIL HCL ER 240 MG PO TBCR
240.0000 mg | EXTENDED_RELEASE_TABLET | Freq: Every day | ORAL | Status: DC
  Administered 2021-04-01: 240 mg via ORAL
  Filled 2021-03-31: qty 1

## 2021-03-31 MED ORDER — FUROSEMIDE 10 MG/ML IJ SOLN
40.0000 mg | Freq: Two times a day (BID) | INTRAMUSCULAR | Status: DC
  Administered 2021-03-31 – 2021-04-01 (×2): 40 mg via INTRAVENOUS
  Filled 2021-03-31 (×2): qty 4

## 2021-03-31 MED ORDER — INSULIN ASPART 100 UNIT/ML IJ SOLN
0.0000 [IU] | Freq: Every day | INTRAMUSCULAR | Status: DC
  Administered 2021-04-01 – 2021-04-03 (×3): 3 [IU] via SUBCUTANEOUS
  Administered 2021-04-04 – 2021-04-06 (×3): 5 [IU] via SUBCUTANEOUS
  Administered 2021-04-07: 3 [IU] via SUBCUTANEOUS
  Administered 2021-04-08: 4 [IU] via SUBCUTANEOUS

## 2021-03-31 MED ORDER — INSULIN ASPART 100 UNIT/ML IJ SOLN
0.0000 [IU] | Freq: Three times a day (TID) | INTRAMUSCULAR | Status: DC
  Administered 2021-04-01: 5 [IU] via SUBCUTANEOUS
  Administered 2021-04-01 (×2): 11 [IU] via SUBCUTANEOUS
  Administered 2021-04-02: 15 [IU] via SUBCUTANEOUS
  Administered 2021-04-02: 7 [IU] via SUBCUTANEOUS
  Administered 2021-04-02: 11 [IU] via SUBCUTANEOUS
  Administered 2021-04-03 (×2): 15 [IU] via SUBCUTANEOUS
  Administered 2021-04-03: 7 [IU] via SUBCUTANEOUS
  Administered 2021-04-04: 11 [IU] via SUBCUTANEOUS
  Administered 2021-04-04: 7 [IU] via SUBCUTANEOUS
  Administered 2021-04-04 – 2021-04-05 (×2): 20 [IU] via SUBCUTANEOUS
  Administered 2021-04-05: 11 [IU] via SUBCUTANEOUS
  Administered 2021-04-05 – 2021-04-06 (×2): 15 [IU] via SUBCUTANEOUS
  Administered 2021-04-06: 20 [IU] via SUBCUTANEOUS
  Administered 2021-04-07: 25 [IU] via SUBCUTANEOUS
  Administered 2021-04-07 (×2): 15 [IU] via SUBCUTANEOUS
  Administered 2021-04-08: 11 [IU] via SUBCUTANEOUS
  Administered 2021-04-08: 20 [IU] via SUBCUTANEOUS
  Administered 2021-04-08 – 2021-04-09 (×2): 15 [IU] via SUBCUTANEOUS
  Administered 2021-04-09: 7 [IU] via SUBCUTANEOUS

## 2021-03-31 NOTE — Assessment & Plan Note (Signed)
Continue home verapamil, hold olmesartan while on aggressive IV diuresis ?

## 2021-03-31 NOTE — Assessment & Plan Note (Signed)
Lifestyle changes outpatient °

## 2021-03-31 NOTE — H&P (Signed)
?History and Physical  ? ? ?Patient: Aaron Potter RSW:[id number] DOB: 1960/11/15 ?DOA: 03/31/2021 ?DOS: the patient was seen and examined on 03/31/2021 ?PCP: Leeanne Rio, MD  ?Patient coming from: Home ? ?Chief Complaint:  ?Chief Complaint  ?Patient presents with  ? Shortness of Breath  ? ?HPI: Aaron Potter is a 61 y.o. male with medical history significant of morbid obesity, hypertension, dyslipidemia, insulin-dependent type 2 diabetes, OSA on BiPAP, and GERD who presented to the ED with worsening shortness of breath and lower extremity edema after gaining what appears to be approximately 60 pounds in the last 1 month.  He has had a recent appointment with his cardiologist Dr. Terri Skains on 3/21 at which time he was noted to have a 17 pound weight gain since his last office visit.  He has remained compliant on his home diuretics and was changed over to torsemide from his usual Lasix on 3/21.  He is usually fairly short of breath with any amount of activity and appears to have worsening symptoms of sleep apnea.  He does not follow low-sodium diet.  Sister is at bedside.  He denies any chest pain, fevers, chills, or cough with sputum production. ? ?In the ED, he was noted to have mild cardiomegaly on chest x-ray and BNP is minimally elevated.  Blood glucose levels are slightly lower.  He has been given 80 mg of IV Lasix.  Cardiology has been consulted by EDP. ? ?Review of Systems: As mentioned in the history of present illness. All other systems reviewed and are negative. ?Past Medical History:  ?Diagnosis Date  ? Allergic rhinitis, unspecified   ? Allergy   ? Anxiety disorder, unspecified   ? Asthma   ? Back pain   ? Balanitis   ? Cellulitis   ? COPD (chronic obstructive pulmonary disease) ([place])   ? Diabetes mellitus without complication ()   ? Essential (primary) hypertension   ? Gastro-esophageal reflux disease with esophagitis   ? Gout   ? Gout, unspecified   ? Hypertension   ? Lichen sclerosus   ? Mixed  hyperlipidemia   ? Morbid (severe) obesity with alveolar hypoventilation (HCC)   ? Obesity, morbid (more than 100 lbs over ideal weight or BMI > 40) (HCC)   ? Obesity, unspecified   ? Polyosteoarthritis, unspecified   ? Sleep apnea   ? Sleep apnea, unspecified   ? CPAP  ? Type 2 diabetes mellitus with hyperglycemia (HCC)   ? Urticaria   ? 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 ?Past Surgical History:  ?Procedure Laterality Date  ? CHOLECYSTECTOMY    ? ERCP  1998  ? [facility], CBD stone s/p cholecystectomy  ? FOOT SURGERY Right 04/2017  ? ?Social History:  reports that he has never smoked. He has never used smokeless tobacco. He reports that he does not drink alcohol and does not use drugs. ? ?Allergies  ?Allergen Reactions  ? Biaxin [Clarithromycin] Anaphylaxis and Shortness Of Breath  ? Ace Inhibitors   ?  D/c ACEi 05/20/2019 > changed to clonidine for pain control component  ? ?Although even in retrospect it may not be clear the ACEi contributed to the pt's symptoms, adding them back at this point or in the future would risk confusion in interpretation of non-specific respiratory symptoms to which this patient is prone ie Better not to muddy the waters here.    ? ? ?Family History  ?Problem Relation Age of Onset  ? Diabetes  Mother   ? Cancer Mother   ?     "male cancer", passed away when patient was 38   ? Diabetes Father   ? Urticaria Sister   ? Colon cancer Neg Hx   ? Colon polyps Neg Hx   ? ? ?Prior to Admission medications   ?Medication Sig Start Date End Date Taking? Authorizing Provider  ?albuterol (PROVENTIL) (2.5 MG/3ML) 0.083% nebulizer solution Take 3 mLs (2.5 mg total) by nebulization every 6 (six) hours as needed for wheezing or shortness of breath. 03/27/19  Yes Corum, Rex Kras, MD  ?allopurinol (ZYLOPRIM) 300 MG tablet Take 300 mg by mouth daily.     Yes [provider]  ?ALPRAZolam Duanne Moron) 0.5 MG tablet Take 1 tablet by mouth 2 (two) times daily as needed for  anxiety.   Yes [provider]  ?BYDUREON BCISE 2 MG/0.85ML AUIJ INJECT INTO THE SKIN ONCE A WEEK. ?Patient taking differently: Inject 2 mg into the skin once a week. 03/30/21  Yes Cassandria Anger, MD  ?diclofenac Sodium (VOLTAREN) 1 % GEL Apply 2 g topically 4 (four) times daily as needed (pain).   Yes [provider]  ?glipiZIDE (GLUCOTROL XL) 5 MG 24 hr tablet TAKE 1 TABLET ONCE DAILY WITH BREAKFAST. ?Patient taking differently: Take 5 mg by mouth daily with breakfast. 03/30/21  Yes Nida, Marella Chimes, MD  ?insulin regular human CONCENTRATED (HUMULIN R U-500 KWIKPEN) 500 UNIT/ML KwikPen Inject 100-130 Units into the skin 3 (three) times daily with meals. 01/13/21  Yes Nida, Marella Chimes, MD  ?metFORMIN (GLUCOPHAGE) 500 MG tablet TAKE 2 TABLETS BY MOUTH TWICE DAILY. ?Patient taking differently: Take 1,000 mg by mouth 2 (two) times daily with a meal. 01/29/21  Yes Nida, Marella Chimes, MD  ?Multiple Vitamin (MULTIVITAMIN WITH MINERALS) TABS tablet Take 1 tablet by mouth daily.   Yes [provider]  ?olmesartan (BENICAR) 20 MG tablet Take 1 tablet (20 mg total) by mouth daily. 09/14/20 03/31/21 Yes Tolia, Sunit, DO  ?oxyCODONE-acetaminophen (PERCOCET) 7.5-325 MG tablet Take 1 tablet by mouth every 6 (six) hours as needed for severe pain.   Yes [provider]  ?pantoprazole (PROTONIX) 40 MG tablet TAKE 1 TABLET DAILY, 30 MINUTES BEFORE BREAKFAST ` ?Patient taking differently: Take 40 mg by mouth daily. 03/30/21  Yes Annitta Needs, NP  ?pravastatin (PRAVACHOL) 40 MG tablet Take 40 mg by mouth daily.   Yes [provider]  ?torsemide (DEMADEX) 10 MG tablet Take 1 tablet (10 mg total) by mouth daily. 03/23/21 06/21/21 Yes Tolia, Sunit, DO  ?verapamil (CALAN-SR) 240 MG CR tablet Take 240 mg by mouth daily.   Yes [provider]  ?Vitamin D, Ergocalciferol, (DRISDOL) 1.25 MG (50000 UNIT) CAPS capsule Take 1 capsule (50,000 Units total) by mouth once a week.  04/01/19  Yes Corum, Rex Kras, MD  ?ACCU-CHEK AVIVA PLUS test strip USE TO CHECK BLOOD SUGAR 4 TIMES DAILY. 01/29/21   Cassandria Anger, MD  ?Accu-Chek FastClix Lancets MISC USE TO CHECK BLOOD SUGAR FOUR TIMES A DAY. 06/29/20   Cassandria Anger, MD  ?Blood Glucose Monitoring Suppl (ACCU-CHEK GUIDE) w/Device KIT 1 Piece by Does not apply route as directed. 01/22/19   Cassandria Anger, MD  ?GLOBAL EASE INJECT PEN NEEDLES 31G X 8 MM MISC USE 4 TIMES DAILY. 03/02/21   Cassandria Anger, MD  ? ? ?Physical Exam: ?Vitals:  ? 03/31/21 1044 03/31/21 1046 03/31/21 1200 03/31/21 1230  ?BP: (!) 156/63  (!) 108/56 Marland Kitchen)  120/51  ?Pulse: 67  64 60  ?Resp: (!) 24  (!) 23 20  ?Temp: 97.7 ?F (36.5 ?C)     ?TempSrc: Oral     ?SpO2: 95% 95% 95% 92%  ?Weight:  (!) 222.7 kg    ?Height:  5' 9.5" (1.765 m)    ? ?Examination: ?Physical Exam: ? ?Constitutional: NAD, morbidly obese ?Neck: Appears normal, supple, no cervical masses, normal ROM, no appreciable thyromegaly ?Respiratory: Clear to auscultation bilaterally, no wheezing, rales, rhonchi or crackles. Normal respiratory effort and patient is not tachypenic. No accessory muscle use.  On nasal cannula oxygen ?Cardiovascular: RRR, no murmurs / rubs / gallops. S1 and S2 auscultated. No extremity edema. 2+ pedal pulses. No carotid bruits.  ?Abdomen: Soft, obese ?GU: Deferred. ?Musculoskeletal: Bilateral lower extremity edema pitting to the knees.  Venous stasis ulcer changes ?Skin: No rashes, lesions, ulcers. No induration; Warm and dry.  ?Neurologic: CN 2-12 grossly intact with no focal deficits.  ?Psychiatric: Normal judgment and insight. Alert and oriented x 3. Normal mood and appropriate affect.  ? ?Data Reviewed: ? ?There are no new results to review at this time. ? ?Assessment and Plan: ?* Acute CHF ([place]) ?Appreciate cardiology evaluation ?Plan to obtain 2D echocardiogram ?Maintain on IV Lasix 40 mg twice daily ?Strict I's and O's ?Daily weights with baseline near 430  pounds ?Plan to hold olmesartan for now ? ?Chronic respiratory failure with hypoxia (HCC) ?Usually wears 3 L nasal cannula at home ?Interested in having a concentrator at discharge ? ?Diabetes mellitus ([place]) ?[place]

## 2021-03-31 NOTE — Assessment & Plan Note (Signed)
Maintain on SSI while inpatient ?Hold home oral agents of metformin and glipizide ?

## 2021-03-31 NOTE — Progress Notes (Signed)
? ? ?Gastroenterology Office Note   ? ?Referring Provider: Suzan Slick, MD ?Primary Care Physician:  Suzan Slick, MD  ?Primary GI: Dr. Jena Gauss  ? ? ?Chief Complaint  ? ?Chief Complaint  ?Patient presents with  ? Follow-up  ? ? ? ?History of Present Illness  ? ?Aaron Potter is a 61 y.o. male presenting today at the request of Rucker, Magdalen Spatz, MD due to positive Cologuard. History significant for morbid obesity with BMI 71.6 today.  ? ?No overt GI bleeding. He notes solid food dysphagia. Takes PPI daily. Shortness of breath at rest. O2 sats 84 initially after ambulating to clinic room. Recheck 94% after resting for several minutes. On 3 liters nasal cannula.  ? ?Weight 491 today. Was 456  a week ago in cardiology office. Prior to this in February 431.  ? ?Anasarca. Periorbital edema. Lower extremity edema and chronic venous stasis changes. Falls asleep at home. Willing to go to the ED. He has difficulty staying awake during visit today. States chest pain with any straining. No active chest pain currently.  ? ?Sister present at visit.  ? ? ? ?Past Medical History:  ?Diagnosis Date  ? Allergic rhinitis, unspecified   ? Allergy   ? Anxiety disorder, unspecified   ? Asthma   ? Back pain   ? Balanitis   ? Cellulitis   ? COPD (chronic obstructive pulmonary disease) (HCC)   ? Diabetes mellitus without complication (HCC)   ? Essential (primary) hypertension   ? Gastro-esophageal reflux disease with esophagitis   ? Gout   ? Gout, unspecified   ? Hypertension   ? Lichen sclerosus   ? Mixed hyperlipidemia   ? Morbid (severe) obesity with alveolar hypoventilation (HCC)   ? Obesity, morbid (more than 100 lbs over ideal weight or BMI > 40) (HCC)   ? Obesity, unspecified   ? Polyosteoarthritis, unspecified   ? Sleep apnea   ? Sleep apnea, unspecified   ? CPAP  ? Type 2 diabetes mellitus with hyperglycemia (HCC)   ? Urticaria   ? 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 ? ?Past Surgical  History:  ?Procedure Laterality Date  ? CHOLECYSTECTOMY    ? ERCP  1998  ? [facility], CBD stone s/p cholecystectomy  ? FOOT SURGERY Right 04/2017  ? ? ?No current facility-administered medications for this visit.  ? ?No current outpatient medications on file.  ? ?Facility-Administered Medications Ordered in Other Visits  ?Medication Dose Route Frequency Provider Last Rate Last Admin  ? 0.9 %  sodium chloride infusion  250 mL Intravenous PRN Sherryll Burger, Pratik D, DO      ? acetaminophen (TYLENOL) tablet 650 mg  650 mg Oral Q6H PRN Sherryll Burger, Pratik D, DO      ? Or  ? acetaminophen (TYLENOL) suppository 650 mg  650 mg Rectal Q6H PRN Sherryll Burger, Pratik D, DO      ? albuterol (PROVENTIL) (2.5 MG/3ML) 0.083% nebulizer solution 2.5 mg  2.5 mg Nebulization Q6H PRN Sherryll Burger, Pratik D, DO   2.5 mg at 04/04/21 1121  ? albuterol (PROVENTIL) (2.5 MG/3ML) 0.083% nebulizer solution 2.5 mg  2.5 mg Nebulization TID Nevin Bloodgood A, MD   2.5 mg at 04/04/21 1549  ? allopurinol (ZYLOPRIM) tablet 300 mg  300 mg Oral Daily Sherryll Burger, Pratik D, DO   300 mg at 04/04/21 0955  ? ALPRAZolam Prudy Feeler) tablet 0.5 mg  0.5 mg Oral BID PRN Sherryll Burger, Pratik D, DO   0.5  mg at 04/03/21 2210  ? diclofenac Sodium (VOLTAREN) 1 % topical gel 2 g  2 g Topical QID PRN Sherryll BurgerShah, Pratik D, DO      ? enoxaparin (LOVENOX) injection 110 mg  110 mg Subcutaneous Q24H Madueme, Elvira C, RPH   110 mg at 04/03/21 2209  ? furosemide (LASIX) 200 mg in dextrose 5 % 100 mL (2 mg/mL) infusion  7 mg/hr Intravenous Continuous Antoine PocheBranch, Jonathan F, MD 3.5 mL/hr at 04/04/21 1500 7 mg/hr at 04/04/21 1500  ? insulin aspart (novoLOG) injection 0-20 Units  0-20 Units Subcutaneous TID WC Shah, Pratik D, DO   11 Units at 04/04/21 1710  ? insulin aspart (novoLOG) injection 0-5 Units  0-5 Units Subcutaneous QHS Maurilio LovelyShah, Pratik D, DO   3 Units at 04/03/21 2209  ? insulin detemir (LEVEMIR) injection 30 Units  30 Units Subcutaneous BID Kendell BaneShahmehdi, Seyed A, MD   30 Units at 04/04/21 [id number]  ? [START ON 04/05/2021] metolazone  (ZAROXOLYN) tablet 2.5 mg  2.5 mg Oral Once Nevin BloodgoodShahmehdi, Seyed A, MD      ? multivitamin with minerals tablet 1 tablet  1 tablet Oral Daily Sherryll BurgerShah, Pratik D, DO   1 tablet at 04/04/21 [id number]  ? ondansetron (ZOFRAN) tablet 4 mg  4 mg Oral Q6H PRN Maurilio LovelyShah, Pratik D, DO      ? Or  ? ondansetron (ZOFRAN) injection 4 mg  4 mg Intravenous Q6H PRN Sherryll BurgerShah, Pratik D, DO      ? oxyCODONE-acetaminophen (PERCOCET) 7.5-325 MG per tablet 1 tablet  1 tablet Oral Q6H PRN Sherryll BurgerShah, Pratik D, DO   1 tablet at 04/04/21 1043  ? pantoprazole (PROTONIX) EC tablet 40 mg  40 mg Oral Daily Sherryll BurgerShah, Pratik D, DO   40 mg at 04/04/21 0955  ? pravastatin (PRAVACHOL) tablet 40 mg  40 mg Oral Daily Sherryll BurgerShah, Pratik D, DO   40 mg at 04/04/21 0955  ? sodium chloride flush (NS) 0.9 % injection 3 mL  3 mL Intravenous Q12H Shah, Pratik D, DO   3 mL at 04/04/21 0956  ? sodium chloride flush (NS) 0.9 % injection 3 mL  3 mL Intravenous PRN Sherryll BurgerShah, Pratik D, DO      ? [START ON 04/05/2021] verapamil (CALAN-SR) CR tablet 120 mg  120 mg Oral Daily Shahmehdi, Gemma PayorSeyed A, MD      ? ? ?Allergies as of 03/31/2021 - Review Complete 03/31/2021  ?Allergen Reaction Noted  ? Biaxin [clarithromycin] Anaphylaxis and Shortness Of Breath 04/06/2013  ? Ace inhibitors  05/05/2020  ? ? ?Family History  ?Problem Relation Age of Onset  ? Diabetes Mother   ? Cancer Mother   ?     "male cancer", passed away when patient was 9   ? Diabetes Father   ? Urticaria Sister   ? Colon cancer Neg Hx   ? Colon polyps Neg Hx   ? ? ?Social History  ? ?Socioeconomic History  ? Marital status: Single  ?  Spouse name: Not on file  ? Number of children: Not on file  ? Years of education: Not on file  ? Highest education level: Not on file  ?Occupational History  ? Not on file  ?Tobacco Use  ? Smoking status: Never  ? Smokeless tobacco: Never  ?Vaping Use  ? Vaping Use: Never used  ?Substance and Sexual Activity  ? Alcohol use: No  ?  Alcohol/week: 0.0 standard drinks  ? Drug use: No  ? Sexual activity: Yes  ?Other  Topics Concern  ?  Not on file  ?Social History Narrative  ? Not on file  ? ?Social Determinants of Health  ? ?Financial Resource Strain: Not on file  ?Food Insecurity: Not on file  ?Transportation Needs: Not on file  ?Physical Activity: Not on file  ?Stress: Not on file  ?Social Connections: Not on file  ?Intimate Partner Violence: Not on file  ? ? ? ?Review of Systems  ? ?See HPI ? ? ?Physical Exam  ? ?BP (!) 130/54   Pulse 71   Temp (!) 97.2 ?F (36.2 ?C)   Ht 5' 9.5" (1.765 m)   Wt (!) 491 lb 14.4 oz (223.1 kg)   BMI 71.60 kg/m?  ?General:   Lethargic. Drowsy. On 3 liters O2. Shortness of breath at rest.  ?Head:  Normocephalic and atraumatic. ?Eyes:  Without icterus ?Ears:  Normal auditory acuity. ?Lungs:  Clear to auscultation bilaterally.  ?Heart:  S1, S2 present ?Abdomen:  +BS, markedly obese, unable to get on table for adequate exam. +anasarca ?Rectal:  Deferred  ?Extremities:  With chronic venous stasis changes. 2+ edema ? ? ?Assessment  ? ?Ubaldo Daywalt is a 61 y.o. male presenting today at the request of Rucker, Magdalen Spatz, MD due to positive Cologuard. History significant for morbid obesity with BMI 71.6 today.  ? ?He appears to be volume overloaded and concerning for acute heart failure. Weight is 491 today, increased from 456 just a week ago at outpatient cardiology visit. He is on 3 liters O2 and markedly short of breath with just talking. He also notes difficulty staying awake. States he has these symptoms for several weeks.  ? ?I discussed need for ED evaluation now. He is willing to proceed to the ED now. Sister is present with him.  ? ?As of note, he is not a candidate for anesthesia here at [facility]. I discussed this with him as well. He will need to be referred to tertiary facility once he is improved from cardiopulmonary standpoint.  ? ? ? ?PLAN  ? ?Directly to ED. I attempted to call report but was unable to reach anyone. ? ?Will need tertiary care evaluation for colonoscopy/EGD due to  BMI and comorbidities.  ? ?Gelene Mink, PhD, ANP-BC ?[facility]  ? ? ?

## 2021-03-31 NOTE — Patient Instructions (Signed)
I am recommending that you go to the emergency room. ? ?You will need a colonoscopy and upper endoscopy, but this will need to be done somewhere such as [facility] or [facility]. We need to get you stable from a cardiopulmonary standpoint first. ? ?Gelene Mink, PhD, ANP-BC ?[facility]  ? ?

## 2021-03-31 NOTE — ED Triage Notes (Signed)
Pt to the ED with complaints of shortness of breath after gaining approximately 38 pounds since March 22nd. Pt just recently started wearing oxygen at home. ? ?Pt has oxygen saturations of 95 percent on 3L [place]. ? ?The patient's PCP sent the pt here for evaluation after his appointment today. ?

## 2021-03-31 NOTE — Assessment & Plan Note (Signed)
Plan to keep on BiPAP overnight ?

## 2021-03-31 NOTE — Assessment & Plan Note (Signed)
Usually wears 3 L nasal cannula at home ?Interested in having a concentrator at discharge ?

## 2021-03-31 NOTE — Assessment & Plan Note (Signed)
PPI ?

## 2021-03-31 NOTE — Progress Notes (Signed)
Patient had wife bring home CPAP unit. Order is for BIPAP at night. Went to place patient on hospital machine but patient was already on his home machine (CPAP) with nasal mask. Took vitals and O2 sat was only 85% with O2 running. Discussed with patient and wife benefits of using BIPAP instead of CPAP and he was agreeable to wearing hospital unit in BIPAP mode. Patient placed on auto BIPAP mode with dreamstation BIPAP and full face mask. O2 sat came up into the 90s and patient states he can breath better. Unplugged patient's machine and returned to wife to pack back up with belongings. ?

## 2021-03-31 NOTE — Assessment & Plan Note (Signed)
Appreciate cardiology evaluation ?Plan to obtain 2D echocardiogram ?Maintain on IV Lasix 40 mg twice daily ?Strict I's and O's ?Daily weights with baseline near 430 pounds ?Plan to hold olmesartan for now ?

## 2021-04-01 ENCOUNTER — Inpatient Hospital Stay (HOSPITAL_COMMUNITY): Payer: Medicaid Other

## 2021-04-01 DIAGNOSIS — I5031 Acute diastolic (congestive) heart failure: Secondary | ICD-10-CM

## 2021-04-01 DIAGNOSIS — I5033 Acute on chronic diastolic (congestive) heart failure: Secondary | ICD-10-CM | POA: Diagnosis not present

## 2021-04-01 DIAGNOSIS — I509 Heart failure, unspecified: Secondary | ICD-10-CM | POA: Diagnosis not present

## 2021-04-01 LAB — GLUCOSE, CAPILLARY
Glucose-Capillary: 185 mg/dL — ABNORMAL HIGH (ref 70–99)
Glucose-Capillary: 266 mg/dL — ABNORMAL HIGH (ref 70–99)
Glucose-Capillary: 257 mg/dL — ABNORMAL HIGH (ref 70–99)
Glucose-Capillary: 263 mg/dL — ABNORMAL HIGH (ref 70–99)

## 2021-04-01 LAB — CBC
HCT: 44.9 % (ref 39.0–52.0)
Hemoglobin: 13.2 g/dL (ref 13.0–17.0)
MCH: 29.6 pg (ref 26.0–34.0)
MCHC: 29.4 g/dL — ABNORMAL LOW (ref 30.0–36.0)
MCV: 100.7 fL — ABNORMAL HIGH (ref 80.0–100.0)
Platelets: 316 10*3/uL (ref 150–400)
RBC: 4.46 MIL/uL (ref 4.22–5.81)
RDW: 16.6 % — ABNORMAL HIGH (ref 11.5–15.5)
WBC: 9.1 10*3/uL (ref 4.0–10.5)
nRBC: 0 % (ref 0.0–0.2)

## 2021-04-01 LAB — ECHOCARDIOGRAM COMPLETE
Height: 69.5 in
S' Lateral: 2.6 cm
Weight: 7814.4 oz

## 2021-04-01 LAB — BASIC METABOLIC PANEL
Anion gap: 7 (ref 5–15)
BUN: 17 mg/dL (ref 6–20)
CO2: 38 mmol/L — ABNORMAL HIGH (ref 22–32)
Calcium: 9.1 mg/dL (ref 8.9–10.3)
Chloride: 94 mmol/L — ABNORMAL LOW (ref 98–111)
Creatinine, Ser: 1.01 mg/dL (ref 0.61–1.24)
GFR, Estimated: >60 mL/min (ref >60)
Glucose, Bld: 196 mg/dL — ABNORMAL HIGH (ref 70–99)
Potassium: 5.1 mmol/L (ref 3.5–5.1)
Sodium: 139 mmol/L (ref 135–145)

## 2021-04-01 LAB — MAGNESIUM: Magnesium: 2 mg/dL (ref 1.7–2.4)

## 2021-04-01 MED ORDER — VERAPAMIL HCL ER 180 MG PO TBCR
180.0000 mg | EXTENDED_RELEASE_TABLET | Freq: Every day | ORAL | Status: DC
  Administered 2021-04-02 – 2021-04-04 (×3): 180 mg via ORAL
  Filled 2021-04-01 (×3): qty 1

## 2021-04-01 MED ORDER — FUROSEMIDE 10 MG/ML IJ SOLN
10.0000 mg/h | INTRAVENOUS | Status: DC
  Administered 2021-04-01 – 2021-04-05 (×3): 8 mg/h via INTRAVENOUS
  Administered 2021-04-06: 9 mg/h via INTRAVENOUS
  Administered 2021-04-07 – 2021-04-09 (×3): 10 mg/h via INTRAVENOUS
  Filled 2021-04-01 (×10): qty 20

## 2021-04-01 NOTE — Progress Notes (Signed)
Tele called patients HR 30 and had 2.3 sec pause , Dr. Sherryll Burger is aware .  ?

## 2021-04-01 NOTE — Consult Note (Addendum)
?Cardiology Consultation:  ? ?Patient ID: Aaron Potter ?MRN: [id number]; DOB: 05/29/1960 ? ?Admit date: 03/31/2021 ?Date of Consult: 04/01/2021 ? ?PCP:  Leeanne Rio, MD ?  ?[facility] ?Cardiologist: Dr. Terri Skains [facility] ? ?Patient Profile:  ? ?Aaron Potter is a 61 y.o. male with a hx of HFpEF, HTN, HLD, IDDM, COPD, OSA and morbid obesity who is being seen 04/01/2021 for the evaluation of CHF at the request of Dr. Manuella Ghazi. ? ?History of Present Illness:  ? ?Aaron Potter was last examined by Dr. Terri Skains on 03/23/2021 and reported dyspnea with minimal activity and he had gained 17 lbs since his last visit with weight at 456 lbs. He was switched from Lasix 72m BID to Torsemide 175mdaily.  ? ?He went to a GI visit on 03/31/2021 and his weight was elevated to 491 lbs and given his associated dyspnea, it was recommended he go to the Emergency Department for further evaluation. In talking with the patient today, he reports a gradual weight gain since the pandemic but this has acutely worsened over the past few weeks. Prior baseline of 410 lbs but at 450 lbs within the past several months but elevated to 490 lbs yesterday. He reports worsening dyspnea on exertion, orthopnea and PND. Uses supplemental oxygen during the day and CPAP at night or when taking naps. Reports worsening lower extremity edema and abdominal distension. No recent chest pain or palpitations. Says he does consume salt and eats fast food on occasion.  ? ?Initial labs showing WBC 8.0, Hgb 13.5, platelets 303, Na+ 141, K+ 4.7 and creatinine 1.01. BNP 107. CXR showing mild cardiomegaly with no acute pulmonary abnormalities. EKG shows NSR, HR 66 with RAD.  ? ?He has been started on IV Lasix 4022mID. Repeat labs this AM show his K+ has increased to  5.1 but creatinine remains stable at 1.01. Net output of -2.8 L thus far.  ? ? ?Past Medical History:  ?Diagnosis Date  ? Allergic rhinitis, unspecified   ? Allergy   ? Anxiety  disorder, unspecified   ? Asthma   ? Back pain   ? Balanitis   ? Cellulitis   ? COPD (chronic obstructive pulmonary disease) ([place] ? Diabetes mellitus without complication ([facility] ? Essential (primary) hypertension   ? Gastro-esophageal reflux disease with esophagitis   ? Gout   ? Gout, unspecified   ? Hypertension   ? Lichen sclerosus   ? Mixed hyperlipidemia   ? Morbid (severe) obesity with alveolar hypoventilation (HCC)   ? Obesity, morbid (more than 100 lbs over ideal weight or BMI > 40) (HCC)   ? Obesity, unspecified   ? Polyosteoarthritis, unspecified   ? Sleep apnea   ? Sleep apnea, unspecified   ? CPAP  ? Type 2 diabetes mellitus with hyperglycemia (HCC)   ? Urticaria   ? 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 ? ?Past Surgical History:  ?Procedure Laterality Date  ? CHOLECYSTECTOMY    ? [facility]  1998  [facility]  ? FOOT SURGERY Right 04/2017  ?  ? ?Home Medications:  ?Prior to Admission medications   ?Medication Sig Start Date End Date Taking? Authorizing Provider  ?albuterol (PROVENTIL) (2.5 MG/3ML) 0.083% nebulizer solution Take 3 mLs (2.5 mg total) by nebulization every 6 (six) hours as needed for wheezing or shortness of breath. 03/27/19  Yes Corum, LisRex KrasD  ?allopurinol (ZYLOPRIM) 300 MG tablet Take  300 mg by mouth daily.     Yes [provider]  ?ALPRAZolam Duanne Moron) 0.5 MG tablet Take 1 tablet by mouth 2 (two) times daily as needed for anxiety.   Yes [provider]  ?BYDUREON BCISE 2 MG/0.85ML AUIJ INJECT INTO THE SKIN ONCE A WEEK. ?Patient taking differently: Inject 2 mg into the skin once a week. 03/30/21  Yes Cassandria Anger, MD  ?diclofenac Sodium (VOLTAREN) 1 % GEL Apply 2 g topically 4 (four) times daily as needed (pain).   Yes [provider]  ?glipiZIDE (GLUCOTROL XL) 5 MG 24 hr tablet TAKE 1 TABLET ONCE DAILY WITH BREAKFAST. ?Patient taking differently: Take 5 mg by mouth daily with breakfast.  03/30/21  Yes Nida, Marella Chimes, MD  ?insulin regular human CONCENTRATED (HUMULIN R U-500 KWIKPEN) 500 UNIT/ML KwikPen Inject 100-130 Units into the skin 3 (three) times daily with meals. 01/13/21  Yes Nida, Marella Chimes, MD  ?metFORMIN (GLUCOPHAGE) 500 MG tablet TAKE 2 TABLETS BY MOUTH TWICE DAILY. ?Patient taking differently: Take 1,000 mg by mouth 2 (two) times daily with a meal. 01/29/21  Yes Nida, Marella Chimes, MD  ?Multiple Vitamin (MULTIVITAMIN WITH MINERALS) TABS tablet Take 1 tablet by mouth daily.   Yes [provider]  ?olmesartan (BENICAR) 20 MG tablet Take 1 tablet (20 mg total) by mouth daily. 09/14/20 03/31/21 Yes Tolia, Sunit, DO  ?oxyCODONE-acetaminophen (PERCOCET) 7.5-325 MG tablet Take 1 tablet by mouth every 6 (six) hours as needed for severe pain.   Yes [provider]  ?pantoprazole (PROTONIX) 40 MG tablet TAKE 1 TABLET DAILY, 30 MINUTES BEFORE BREAKFAST ` ?Patient taking differently: Take 40 mg by mouth daily. 03/30/21  Yes Annitta Needs, NP  ?pravastatin (PRAVACHOL) 40 MG tablet Take 40 mg by mouth daily.   Yes [provider]  ?torsemide (DEMADEX) 10 MG tablet Take 1 tablet (10 mg total) by mouth daily. 03/23/21 06/21/21 Yes Tolia, Sunit, DO  ?verapamil (CALAN-SR) 240 MG CR tablet Take 240 mg by mouth daily.   Yes [provider]  ?Vitamin D, Ergocalciferol, (DRISDOL) 1.25 MG (50000 UNIT) CAPS capsule Take 1 capsule (50,000 Units total) by mouth once a week. 04/01/19  Yes Corum, Rex Kras, MD  ?ACCU-CHEK AVIVA PLUS test strip USE TO CHECK BLOOD SUGAR 4 TIMES DAILY. 01/29/21   Cassandria Anger, MD  ?Accu-Chek FastClix Lancets MISC USE TO CHECK BLOOD SUGAR FOUR TIMES A DAY. 06/29/20   Cassandria Anger, MD  ?Blood Glucose Monitoring Suppl (ACCU-CHEK GUIDE) w/Device KIT 1 Piece by Does not apply route as directed. 01/22/19   Cassandria Anger, MD  ?GLOBAL EASE INJECT PEN NEEDLES 31G X 8 MM MISC USE 4 TIMES DAILY. 03/02/21   Cassandria Anger, MD   ? ? ?Inpatient Medications: ?Scheduled Meds: ? albuterol  2.5 mg Nebulization QID  ? allopurinol  300 mg Oral Daily  ? enoxaparin (LOVENOX) injection  110 mg Subcutaneous Q24H  ? furosemide  40 mg Intravenous Q12H  ? insulin aspart  0-20 Units Subcutaneous TID WC  ? insulin aspart  0-5 Units Subcutaneous QHS  ? multivitamin with minerals  1 tablet Oral Daily  ? pantoprazole  40 mg Oral Daily  ? pravastatin  40 mg Oral Daily  ? sodium chloride flush  3 mL Intravenous Q12H  ? verapamil  240 mg Oral Daily  ? ?Continuous Infusions: ? sodium chloride    ? ?PRN Meds: ?sodium chloride, acetaminophen **OR** acetaminophen, albuterol, ALPRAZolam, diclofenac Sodium, ondansetron **OR** ondansetron (ZOFRAN) IV,  oxyCODONE-acetaminophen, sodium chloride flush ? ?Allergies:    ?Allergies  ?Allergen Reactions  ? Biaxin [Clarithromycin] Anaphylaxis and Shortness Of Breath  ? Ace Inhibitors   ?  D/c ACEi 05/20/2019 > changed to clonidine for pain control component  ? ?Although even in retrospect it may not be clear the ACEi contributed to the pt's symptoms, adding them back at this point or in the future would risk confusion in interpretation of non-specific respiratory symptoms to which this patient is prone ie Better not to muddy the waters here.    ? ? ?Social History:   ?Social History  ? ?Socioeconomic History  ? Marital status: Single  ?  Spouse name: Not on file  ? Number of children: Not on file  ? Years of education: Not on file  ? Highest education level: Not on file  ?Occupational History  ? Not on file  ?Tobacco Use  ? Smoking status: Never  ? Smokeless tobacco: Never  ?Vaping Use  ? Vaping Use: Never used  ?Substance and Sexual Activity  ? Alcohol use: No  ?  Alcohol/week: 0.0 standard drinks  ? Drug use: No  ? Sexual activity: Yes  ?Other Topics Concern  ? Not on file  ?Social History Narrative  ? Not on file  ? ?Social Determinants of Health  ? ?Financial Resource Strain: Not on file  ?Food Insecurity: Not on file   ?Transportation Needs: Not on file  ?Physical Activity: Not on file  ?Stress: Not on file  ?Social Connections: Not on file  ?Intimate Partner Violence: Not on file  ?  ?Family History:   ? ?Family History  ?Problem R

## 2021-04-01 NOTE — Progress Notes (Signed)
?PROGRESS NOTE ? ? ? ?Aaron Potter  [id number] DOB: 1960-07-18 DOA: 03/31/2021 ?PCP: Leeanne Rio, MD ? ? ?Brief Narrative:  ?Per HPI: ?Aaron Potter is a 61 y.o. male with medical history significant of morbid obesity, hypertension, dyslipidemia, insulin-dependent type 2 diabetes, OSA on BiPAP, and GERD who presented to the ED with worsening shortness of breath and lower extremity edema after gaining what appears to be approximately 60 pounds in the last 1 month.  He has had a recent appointment with his cardiologist Dr. Terri Skains on 3/21 at which time he was noted to have a 17 pound weight gain since his last office visit.  He has remained compliant on his home diuretics and was changed over to torsemide from his usual Lasix on 3/21.  He is usually fairly short of breath with any amount of activity and appears to have worsening symptoms of sleep apnea.  He does not follow low-sodium diet.  Sister is at bedside.  He denies any chest pain, fevers, chills, or cough with sputum production. ? ?3/30: Patient was admitted with acute on chronic HFpEF and is massively volume overloaded.  He was started on IV Lasix with approximately 2 L of negative fluid balance overnight.  He has been seen by cardiology today and started on Lasix drip to assist with diuresis.  Repeat echocardiogram pending.  ? ? ?Assessment & Plan: ?  ?Principal Problem: ?  Acute CHF ([place]) ?Active Problems: ?  Morbid obesity ([place]) ?  Obstructive sleep apnea ?  Essential (primary) hypertension ?  Gastro-esophageal reflux disease with esophagitis ?  Diabetes mellitus ([place]) ?  Chronic respiratory failure with hypoxia (HCC) ? ?Assessment and Plan: ? ? ?Acute on chronic HFpEF (HCC) ?Appreciate ongoing cardiology evaluation ?Plan to obtain 2D echocardiogram ?Maintain on IV Lasix drip as ordered per cardiology ?Strict I's and O's ?Daily weights  ?Plan to hold olmesartan for now ?  ?Chronic respiratory failure with hypoxia (HCC) ?Usually wears 3 L nasal  cannula at home ?Interested in having a concentrator at discharge ?  ?Diabetes mellitus ([place]) ?Maintain on SSI while inpatient ?Hold home oral agents of metformin and glipizide ?  ?Gastro-esophageal reflux disease with esophagitis ?PPI ?  ?Essential (primary) hypertension ?Continue home verapamil, hold olmesartan while on aggressive IV diuresis ?Verapamil dose decreased due to sinus pauses ?  ?Obstructive sleep apnea ?Plan to keep on BiPAP overnight ?  ?Morbid obesity ([place]) ?Lifestyle changes outpatient ?  ? ?DVT prophylaxis: Lovenox ?Code Status: Full ?Family Communication: Sister at bedside 3/29 ?Disposition Plan:  ?Status is: Inpatient ?Remains inpatient appropriate because: Need for ongoing IV diuresis. ? ?Consultants:  ?Cardiology ? ?Procedures:  ?See below ? ?Antimicrobials:  ?None ? ? ?Subjective: ?Patient seen and evaluated today with no new acute complaints or concerns. No acute concerns or events noted overnight.  He is complaining of the bed being uncomfortable at night and therefore does not sleep well. ? ?Objective: ?Vitals:  ? 04/01/21 0533 04/01/21 0730 04/01/21 1121 04/01/21 1213  ?BP: (!) 125/53   (!) 156/62  ?Pulse: 67   62  ?Resp: 17   20  ?Temp: 97.9 ?F (36.6 ?C)   98.3 ?F (36.8 ?C)  ?TempSrc: Oral   Oral  ?SpO2: 94% 94% 95% 96%  ?Weight: (!) 221.5 kg     ?Height:      ? ? ?Intake/Output Summary (Last 24 hours) at 04/01/2021 1232 ?Last data filed at 04/01/2021 1100 ?Gross per 24 hour  ?Intake 720 ml  ?Output 3300 ml  ?Net -2580  ml  ? ?Filed Weights  ? 03/31/21 1046 04/01/21 0533  ?Weight: (!) 222.7 kg (!) 221.5 kg  ? ? ?Examination: ? ?General exam: Appears calm and comfortable, morbidly obese ?Respiratory system: Clear to auscultation. Respiratory effort normal.  4 L nasal cannula ?Cardiovascular system: S1 & S2 heard, RRR.  ?Gastrointestinal system: Abdomen is soft ?Central nervous system: Alert and awake ?Extremities: Significant bilateral lower extremity edema, pitting with venous stasis ulcer  changes ?Skin: No significant lesions noted ?Psychiatry: Flat affect. ? ? ? ?Data Reviewed: I have personally reviewed following labs and imaging studies ? ?CBC: ?Recent Labs  ?Lab 03/31/21 ?1120 04/01/21 ?[id number]  ?WBC 8.0 9.1  ?NEUTROABS 5.2  --   ?HGB 13.5 13.2  ?HCT 47.0 44.9  ?MCV 102.0* 100.7*  ?PLT 303 316  ? ?Basic Metabolic Panel: ?Recent Labs  ?Lab 03/31/21 ?1120 04/01/21 ?[id number]  ?NA 141 139  ?K 4.7 5.1  ?CL 100 94*  ?CO2 38* 38*  ?GLUCOSE 58* 196*  ?BUN 17 17  ?CREATININE 1.01 1.01  ?CALCIUM 9.2 9.1  ?MG  --  2.0  ? ?GFR: ?Estimated Creatinine Clearance: 148.5 mL/min (by C-G formula based on SCr of 1.01 mg/dL). ?Liver Function Tests: ?Recent Labs  ?Lab 03/31/21 ?1120  ?AST 23  ?ALT 20  ?ALKPHOS 61  ?BILITOT 0.3  ?PROT 7.2  ?ALBUMIN 3.5  ? ?No results for input(s): LIPASE, AMYLASE in the last 168 hours. ?No results for input(s): AMMONIA in the last 168 hours. ?Coagulation Profile: ?No results for input(s): INR, PROTIME in the last 168 hours. ?Cardiac Enzymes: ?No results for input(s): CKTOTAL, CKMB, CKMBINDEX, TROPONINI in the last 168 hours. ?BNP (last 3 results) ?Recent Labs  ?  08/12/20 ?1036  ?PROBNP 122  ? ?HbA1C: ?No results for input(s): HGBA1C in the last 72 hours. ?CBG: ?Recent Labs  ?Lab 03/31/21 ?1608 03/31/21 ?1751 03/31/21 ?2130 04/01/21 ?0730 04/01/21 ?1113  ?GLUCAP 69* 145* 145* 185* 266*  ? ?Lipid Profile: ?No results for input(s): CHOL, HDL, LDLCALC, TRIG, CHOLHDL, LDLDIRECT in the last 72 hours. ?Thyroid Function Tests: ?No results for input(s): TSH, T4TOTAL, FREET4, T3FREE, THYROIDAB in the last 72 hours. ?Anemia Panel: ?No results for input(s): VITAMINB12, FOLATE, FERRITIN, TIBC, IRON, RETICCTPCT in the last 72 hours. ?Sepsis Labs: ?No results for input(s): PROCALCITON, LATICACIDVEN in the last 168 hours. ? ?No results found for this or any previous visit (from the past 240 hour(s)).  ? ? ? ? ? ?Radiology Studies: ?DG Chest Port 1 View ? ?Result Date: 03/31/2021 ?CLINICAL DATA:  Shortness of  breath. EXAM: PORTABLE CHEST 1 VIEW COMPARISON:  February 18, 2021. FINDINGS: Mild cardiomegaly is noted. Both lungs are clear. The visualized skeletal structures are unremarkable. IMPRESSION: Mild cardiomegaly.  No acute pulmonary abnormality seen. Electronically Signed   By: Marijo Conception M.D.   On: 03/31/2021 11:52   ? ? ? ? ? ?Scheduled Meds: ? albuterol  2.5 mg Nebulization QID  ? allopurinol  300 mg Oral Daily  ? enoxaparin (LOVENOX) injection  110 mg Subcutaneous Q24H  ? insulin aspart  0-20 Units Subcutaneous TID WC  ? insulin aspart  0-5 Units Subcutaneous QHS  ? multivitamin with minerals  1 tablet Oral Daily  ? pantoprazole  40 mg Oral Daily  ? pravastatin  40 mg Oral Daily  ? sodium chloride flush  3 mL Intravenous Q12H  ? [START ON 04/02/2021] verapamil  180 mg Oral Daily  ? ?Continuous Infusions: ? sodium chloride    ? furosemide (LASIX) 200 mg in  dextrose 5% 100 mL (2mg /mL) infusion 8 mg/hr (04/01/21 1137)  ? ? ? LOS: 1 day  ? ? ?Time spent: 35 minutes ? ? ? ?[place], DO ?[facility] ? ?If 7PM-7AM, please contact night-coverage ?[email or web address] ?04/01/2021, 12:32 PM  ? ?

## 2021-04-01 NOTE — Progress Notes (Signed)
Helped patient with set up of home CPAP machine.  Provided water bottle for humidification and machine plugged into red outlet.  Hooked up O2 at 4L to mask for patient.  Patient stated he can put his own machine on tonight.  Machine is at bedside for patient. ?

## 2021-04-01 NOTE — Hospital Course (Addendum)
Per HPI: ?Aaron Potter is a 61 y.o. male with medical history significant of morbid obesity, hypertension, dyslipidemia, insulin-dependent type 2 diabetes, OSA on BiPAP, and GERD who presented to the ED with worsening shortness of breath and lower extremity edema after gaining what appears to be approximately 60 pounds in the last 1 month.  He has had a recent appointment with his cardiologist Dr. Odis Hollingshead on 3/21 at which time he was noted to have a 17 pound weight gain since his last office visit.  He has remained compliant on his home diuretics and was changed over to torsemide from his usual Lasix on 3/21.  He is usually fairly short of breath with any amount of activity and appears to have worsening symptoms of sleep apnea.  He does not follow low-sodium diet.  Sister is at bedside.  He denies any chest pain, fevers, chills, or cough with sputum production. ? ?Patient continues to have significant diuresis with ongoing Lasix drip. ?

## 2021-04-01 NOTE — Progress Notes (Signed)
*  PRELIMINARY RESULTS* ?Echocardiogram ?2D Echocardiogram has been performed. ? ?Aaron Potter ?04/01/2021, 3:42 PM ?

## 2021-04-01 NOTE — Plan of Care (Signed)
?  Problem: Education: ?Goal: Knowledge of General Education information will improve ?Description: Including pain rating scale, medication(s)/side effects and non-pharmacologic comfort measures ?Outcome: Progressing ?  ?Problem: Education: ?Goal: Ability to demonstrate management of disease process will improve ?04/01/2021 0451 by Gerrit Heck, RN ?Outcome: Progressing ?04/01/2021 0450 by Gerrit Heck, RN ?Outcome: Not Progressing ?  ?Problem: Clinical Measurements: ?Goal: Will remain free from infection ?Outcome: Progressing ?  ?Problem: Nutrition: ?Goal: Adequate nutrition will be maintained ?Outcome: Progressing ?  ?Problem: Elimination: ?Goal: Will not experience complications related to urinary retention ?Outcome: Progressing ?  ?Problem: Pain Managment: ?Goal: General experience of comfort will improve ?Outcome: Progressing ?  ?Problem: Safety: ?Goal: Ability to remain free from injury will improve ?Outcome: Progressing ?  ?Problem: Skin Integrity: ?Goal: Risk for impaired skin integrity will decrease ?Outcome: Progressing ?  ?Problem: Education: ?Goal: Ability to verbalize understanding of medication therapies will improve ?04/01/2021 0451 by Gerrit Heck, RN ?Outcome: Not Progressing ?04/01/2021 0450 by Gerrit Heck, RN ?Outcome: Progressing ?  ?Problem: Activity: ?Goal: Capacity to carry out activities will improve ?04/01/2021 0451 by Gerrit Heck, RN ?Outcome: Not Progressing ?04/01/2021 0450 by Gerrit Heck, RN ?Outcome: Not Progressing ?  ?Problem: Cardiac: ?Goal: Ability to achieve and maintain adequate cardiopulmonary perfusion will improve ?Outcome: Not Progressing ?  ?Problem: Health Behavior/Discharge Planning: ?Goal: Ability to manage health-related needs will improve ?Outcome: Not Progressing ?  ?Problem: Clinical Measurements: ?Goal: Ability to maintain clinical measurements within normal limits will improve ?Outcome: Not Progressing ?Goal: Diagnostic test results will improve ?Outcome: Not  Progressing ?Goal: Respiratory complications will improve ?Outcome: Not Progressing ?Goal: Cardiovascular complication will be avoided ?Outcome: Not Progressing ?  ?Problem: Activity: ?Goal: Risk for activity intolerance will decrease ?Outcome: Not Progressing ?  ?Problem: Coping: ?Goal: Level of anxiety will decrease ?Outcome: Not Progressing ?  ?Problem: Elimination: ?Goal: Will not experience complications related to bowel motility ?Outcome: Not Progressing ?  ?

## 2021-04-01 NOTE — TOC Progression Note (Signed)
?  Transition of Care (TOC) Screening Note ? ? ?Patient Details  ?Name: Aaron Potter ?Date of Birth: [birth year] ? ? ?Transition of Care (TOC) CM/SW Contact:    ?Elliot Gault, LCSW ?Phone Number: ?04/01/2021, 10:48 AM ? ? ? ?Transition of Care Department [facility]) has reviewed patient and no TOC needs have been identified at this time. We will continue to monitor patient advancement through interdisciplinary progression rounds. If new patient transition needs arise, please place a TOC consult. ? ? ?

## 2021-04-01 NOTE — Progress Notes (Signed)
Checked patient's CPAP machine for him.  O2 running through fine.  Patient sat is at 98% with 4L running through machine.  Will continue to monitor. ?

## 2021-04-01 NOTE — Progress Notes (Signed)
Inpatient Diabetes Program Recommendations ? ?AACE/ADA: New Consensus Statement on Inpatient Glycemic Control (2015) ? ?Target Ranges:  Prepandial:   less than 140 mg/dL ?     Peak postprandial:   less than 180 mg/dL (1-2 hours) ?     Critically ill patients:  140 - 180 mg/dL  ? ?Lab Results  ?Component Value Date  ? GLUCAP 266 (H) 04/01/2021  ? HGBA1C 8.1 (A) 01/13/2021  ? ? ?Review of Glycemic Control ? Latest Reference Range & Units 04/01/21 07:30 04/01/21 11:13  ?Glucose-Capillary 70 - 99 mg/dL 383 (H) 338 (H)  ?(H): Data is abnormally high ? ?Diabetes history: DM2 ?Outpatient Diabetes medications: Bydureon 2 mg weekly, Glipizide 5 mg QAM, U-500 130 units with BF, 130 units with lunch, 100 units with supper, Metformin 1000 mg BID,  ?Current orders for Inpatient glycemic control: Novolog 0-20 units TID and 0-5 QHS ? ?Inpatient Diabetes Program Recommendations:   ? ?Novolog 4 units TID with meals IF consumes 50% ? ?Will continue to follow while inpatient. ? ?Thank you, ?Dulce Sellar, MSN, RN ?Diabetes Coordinator ?Inpatient Diabetes Program ?[phone] (team pager from 8a-5p) ? ? ? ?

## 2021-04-01 NOTE — Progress Notes (Incomplete)
Attending note ? ?Patient seen and discussed with PA Iran Ouch, I agree with her documentation. 61 yo male followed by Dr Odis Hollingshead [facility] cardiology. History of HTN, HL, DM2, OSA, morbid obesity, chronic HFpEF.  ? ? ?Office weight 03/23/21 456 lbs, was 17 lbs up at that time.  ? ? ? ?WBC 8 Hgb 13.5 Plt 303 K 4.7 Cr 1.01 BUN 17 BNP 107  ?CXR mild caridomegaly ?EKG NSR ? ?04/2020 Echo: limited visualization, probably normal LVE. Possibly moderate AS ? ?Patient presents massively fluid overloaded, acute on chronic HFpEF. BNP underwhelming in setting of preserved EF and severe obesity. Clinic weight 456 lbs 03/23/21, was up 17 lbs at that time. Admit weight if accurate listed at 490 lbs. Negative 2.4 Ls yesterday, received IV 80mg  and 40mg  yesterday. Renal function remains stable. Will require several days of IV diuresis, I think given degree of volume overload best option would be lasix drip. Negative 2.4L with total of 120mg  IV lasix yesterday, start lasix drip at 7mg /hr. REpeat echo, limited visualization on prior study. Of note there was mention of possible degree of AS from last echo, will need to reassess with repeat echo.  ? ? ? MD ?

## 2021-04-02 DIAGNOSIS — J9611 Chronic respiratory failure with hypoxia: Secondary | ICD-10-CM | POA: Diagnosis not present

## 2021-04-02 DIAGNOSIS — E1169 Type 2 diabetes mellitus with other specified complication: Secondary | ICD-10-CM

## 2021-04-02 DIAGNOSIS — I509 Heart failure, unspecified: Secondary | ICD-10-CM | POA: Diagnosis not present

## 2021-04-02 DIAGNOSIS — I5033 Acute on chronic diastolic (congestive) heart failure: Secondary | ICD-10-CM | POA: Diagnosis not present

## 2021-04-02 DIAGNOSIS — I10 Essential (primary) hypertension: Secondary | ICD-10-CM

## 2021-04-02 DIAGNOSIS — K2100 Gastro-esophageal reflux disease with esophagitis, without bleeding: Secondary | ICD-10-CM

## 2021-04-02 DIAGNOSIS — Z794 Long term (current) use of insulin: Secondary | ICD-10-CM

## 2021-04-02 DIAGNOSIS — G4733 Obstructive sleep apnea (adult) (pediatric): Secondary | ICD-10-CM

## 2021-04-02 LAB — BASIC METABOLIC PANEL
Anion gap: 10 (ref 5–15)
BUN: 17 mg/dL (ref 6–20)
CO2: 39 mmol/L — ABNORMAL HIGH (ref 22–32)
Calcium: 9.3 mg/dL (ref 8.9–10.3)
Chloride: 89 mmol/L — ABNORMAL LOW (ref 98–111)
Creatinine, Ser: 1.02 mg/dL (ref 0.61–1.24)
GFR, Estimated: >60 mL/min (ref >60)
Glucose, Bld: 263 mg/dL — ABNORMAL HIGH (ref 70–99)
Potassium: 4.6 mmol/L (ref 3.5–5.1)
Sodium: 138 mmol/L (ref 135–145)

## 2021-04-02 LAB — GLUCOSE, CAPILLARY
Glucose-Capillary: 233 mg/dL — ABNORMAL HIGH (ref 70–99)
Glucose-Capillary: 297 mg/dL — ABNORMAL HIGH (ref 70–99)
Glucose-Capillary: 311 mg/dL — ABNORMAL HIGH (ref 70–99)
Glucose-Capillary: 292 mg/dL — ABNORMAL HIGH (ref 70–99)

## 2021-04-02 LAB — MAGNESIUM: Magnesium: 1.7 mg/dL (ref 1.7–2.4)

## 2021-04-02 MED ORDER — INSULIN DETEMIR 100 UNIT/ML ~~LOC~~ SOLN
20.0000 [IU] | Freq: Two times a day (BID) | SUBCUTANEOUS | Status: DC
  Administered 2021-04-02 – 2021-04-03 (×4): 20 [IU] via SUBCUTANEOUS
  Filled 2021-04-02 (×5): qty 0.2

## 2021-04-02 MED ORDER — ALBUTEROL SULFATE (2.5 MG/3ML) 0.083% IN NEBU
2.5000 mg | INHALATION_SOLUTION | Freq: Three times a day (TID) | RESPIRATORY_TRACT | Status: DC
  Administered 2021-04-02 – 2021-04-09 (×22): 2.5 mg via RESPIRATORY_TRACT
  Filled 2021-04-02 (×21): qty 3

## 2021-04-02 NOTE — Plan of Care (Signed)
?  Problem: Acute Rehab PT Goals(only PT should resolve) ?Goal: Pt Will Ambulate ?Outcome: Progressing ?Flowsheets (Taken 04/02/2021 1220) ?Pt will Ambulate: ? 15 feet ? with least restrictive assistive device ? with min guard assist ?Goal: Pt/caregiver will Perform Home Exercise Program ?Outcome: Progressing ?Flowsheets (Taken 04/02/2021 1220) ?Pt/caregiver will Perform Home Exercise Program: ? For increased strengthening ? For improved balance ? Independently ? 12:20 PM, 04/02/21 ?Wyman Songster PT, DPT ?Physical Therapist at [facility] ?[facility] ? ?

## 2021-04-02 NOTE — Evaluation (Signed)
Physical Therapy Evaluation ?Patient Details ?Name: Aaron Potter ?MRN: [id number] ?DOB: 1960-06-06 ?Today's Date: 04/02/2021 ? ?History of Present Illness ? Aaron Potter is a 61 y.o. male with medical history significant of morbid obesity, hypertension, dyslipidemia, insulin-dependent type 2 diabetes, OSA on BiPAP, and GERD who presented to the ED with worsening shortness of breath and lower extremity edema after gaining what appears to be approximately 60 pounds in the last 1 month.  He has had a recent appointment with his cardiologist Dr. Odis Hollingshead on 3/21 at which time he was noted to have a 17 pound weight gain since his last office visit.  He has remained compliant on his home diuretics and was changed over to torsemide from his usual Lasix on 3/21.  He is usually fairly short of breath with any amount of activity and appears to have worsening symptoms of sleep apnea.  He does not follow low-sodium diet.  Sister is at bedside.  He denies any chest pain, fevers, chills, or cough with sputum production. ?  ?Clinical Impression ? Patient limited for functional mobility as stated below secondary to BLE weakness, fatigue and impaired standing balance. O2 sat monitored throughout session on 6L: 89-92% at rest, 87-90% with mobility. Patient transfers to standing using momentum and uses bed rails for support upon standing. He ambulates several steps holding onto bed rail and is limited by fatigue. Patient will benefit from continued physical therapy in hospital and recommended venue below to increase strength, balance, endurance for safe ADLs and gait. ?   ?   ? ?Recommendations for follow up therapy are one component of a multi-disciplinary discharge planning process, led by the attending physician.  Recommendations may be updated based on patient status, additional functional criteria and insurance authorization. ? ?Follow Up Recommendations Home health PT ? ?  ?Assistance Recommended at Discharge Intermittent  Supervision/Assistance  ?Patient can return home with the following ? A little help with bathing/dressing/bathroom;A little help with walking and/or transfers;Assistance with cooking/housework;Assist for transportation;Help with stairs or ramp for entrance ? ?  ?Equipment Recommendations None recommended by PT  ?Recommendations for Other Services ?    ?  ?Functional Status Assessment Patient has had a recent decline in their functional status and demonstrates the ability to make significant improvements in function in a reasonable and predictable amount of time.  ? ?  ?Precautions / Restrictions Precautions ?Precautions: Fall ?Restrictions ?Weight Bearing Restrictions: No  ? ?  ? ?Mobility ? Bed Mobility ?  ?  ?  ?  ?  ?  ?  ?General bed mobility comments: seated in chair at beginning of session ?  ? ?Transfers ?Overall transfer level: Modified independent ?Equipment used: None ?  ?  ?  ?  ?  ?  ?  ?General transfer comment: relies on momentum to transfer to standing from chair ?  ? ?Ambulation/Gait ?Ambulation/Gait assistance: Min guard, Supervision ?Gait Distance (Feet): 6 Feet ?Assistive device: None (bed rail) ?Gait Pattern/deviations: Wide base of support ?Gait velocity: decreased ?  ?  ?General Gait Details: slow, labored, unsteady, use of bed rail for support, near baseline ? ?Stairs ?  ?  ?  ?  ?  ? ?Wheelchair Mobility ?  ? ?Modified Rankin (Stroke Patients Only) ?  ? ?  ? ?Balance Overall balance assessment: Needs assistance ?Sitting-balance support: No upper extremity supported, Feet supported ?Sitting balance-Leahy Scale: Fair ?Sitting balance - Comments: seated edge of chair ?  ?Standing balance support: Single extremity supported ?Standing balance-Leahy Scale: Fair ?  ?  ?  ?  ?  ?  ?  ?  ?  ?  ?  ?  ?   ? ? ? ?  Pertinent Vitals/Pain Pain Assessment ?Pain Assessment: Faces ?Faces Pain Scale: Hurts little more ?Pain Location: generalized ?Pain Descriptors / Indicators: Sore ?Pain Intervention(s): Limited  activity within patient's tolerance, Monitored during session, Repositioned  ? ? ?Home Living Family/patient expects to be discharged to:: Private residence ?Living Arrangements: Alone ?Available Help at Discharge: Family ?Type of Home: House ?Home Access: Stairs to enter ?Entrance Stairs-Rails: Right;Left ?Entrance Stairs-Number of Steps: 2 back steps with grab bar, 5 front steps with bilateral rail - cant reach both sides ?  ?Home Layout: One level ?Home Equipment: Other (comment);Rolling Walker (2 wheels);Shower seat;Crutches;Grab bars - tub/shower (walking stick) ?   ?  ?Prior Function Prior Level of Function : Needs assist ?  ?  ?  ?  ?  ?  ?Mobility Comments: household ambulation ?ADLs Comments: mostly independent with basic ADL, family assists PRN ?  ? ? ?Hand Dominance  ?   ? ?  ?Extremity/Trunk Assessment  ? Upper Extremity Assessment ?Upper Extremity Assessment: Generalized weakness ?  ? ?Lower Extremity Assessment ?Lower Extremity Assessment: Generalized weakness ?  ? ?Cervical / Trunk Assessment ?Cervical / Trunk Assessment: Normal  ?Communication  ? Communication: No difficulties  ?Cognition Arousal/Alertness: Awake/alert ?Behavior During Therapy: [facility] for tasks assessed/performed ?Overall Cognitive Status: Within Functional Limits for tasks assessed ?  ?  ?  ?  ?  ?  ?  ?  ?  ?  ?  ?  ?  ?  ?  ?  ?  ?  ?  ? ?  ?General Comments   ? ?  ?Exercises    ? ?Assessment/Plan  ?  ?PT Assessment Patient needs continued PT services  ?PT Problem List Decreased strength;Decreased mobility;Decreased activity tolerance;Decreased balance;Cardiopulmonary status limiting activity;Obesity ? ?   ?  ?PT Treatment Interventions DME instruction;Therapeutic exercise;Gait training;Balance training;Stair training;Neuromuscular re-education;Functional mobility training;Therapeutic activities;Patient/family education   ? ?PT Goals (Current goals can be found in the Care Plan section)  ?Acute Rehab PT Goals ?Patient Stated Goal:  return home and improve mobility ?PT Goal Formulation: With patient ?Time For Goal Achievement: 04/16/21 ?Potential to Achieve Goals: Fair ? ?  ?Frequency Min 3X/week ?  ? ? ?Co-evaluation   ?  ?  ?  ?  ? ? ?  ?AM-PAC PT "6 Clicks" Mobility  ?Outcome Measure Help needed turning from your back to your side while in a flat bed without using bedrails?: None ?Help needed moving from lying on your back to sitting on the side of a flat bed without using bedrails?: A Little ?Help needed moving to and from a bed to a chair (including a wheelchair)?: A Little ?Help needed standing up from a chair using your arms (e.g., wheelchair or bedside chair)?: A Little ?Help needed to walk in hospital room?: A Lot ?Help needed climbing 3-5 steps with a railing? : A Lot ?6 Click Score: 17 ? ?  ?End of Session Equipment Utilized During Treatment: Oxygen ?Activity Tolerance: Patient limited by fatigue ?Patient left: in chair;with family/visitor present;with call bell/phone within reach ?Nurse Communication: Mobility status ?PT Visit Diagnosis: Unsteadiness on feet (R26.81);Other abnormalities of gait and mobility (R26.89);Muscle weakness (generalized) (M62.81) ?  ? ?Time: 7494-4967 ?PT Time Calculation (min) (ACUTE ONLY): 17 min ? ? ?Charges:   PT Evaluation ?$PT Eval Moderate Complexity: 1 Mod ?PT Treatments ?$Therapeutic Activity: 8-22 mins ?  ?   ? ?12:18 PM, 04/02/21 ?Wyman Songster PT, DPT ?Physical Therapist at [facility] ?[facility] ? ? ?

## 2021-04-02 NOTE — TOC Initial Note (Signed)
Transition of Care (TOC) - Initial/Assessment Note  ? ? ?Patient Details  ?Name: Aaron Potter ?MRN: [id number] ?Date of Birth: [birth year] ? ?Transition of Care (TOC) CM/SW Contact:    ?Elliot Gault, LCSW ?Phone Number: ?04/02/2021, 1:51 PM ? ?Clinical Narrative:                 ? ?PT recommending HH for dc. Attempted to find [facility] though there are no HH agencies that can take pt's insurance. Spoke with pt who is agreeable to outpt PT referral. Provider options reviewed with pt. Referral made to [facility] outpatient rehab at pt request. Referral faxed to [facility] at [phone]. Information added to AVS. ? ?Anticipating dc over the weekend.  ? ?Expected Discharge Plan: OP Rehab ?Barriers to Discharge: Continued Medical Work up ? ? ?Patient Goals and CMS Choice ?Patient states their goals for this hospitalization and ongoing recovery are:: go home ?CMS Medicare.gov Compare Post Acute Care list provided to:: Patient ?Choice offered to / list presented to : Patient ? ?Expected Discharge Plan and Services ?Expected Discharge Plan: OP Rehab ?In-house Referral: Clinical Social Work ?  ?  ?Living arrangements for the past 2 months: Single Family Home ?                ?  ?  ?  ?  ?  ?  ?  ?  ?  ?  ? ?Prior Living Arrangements/Services ?Living arrangements for the past 2 months: Single Family Home ?Lives with:: Self ?Patient language and need for interpreter reviewed:: Yes ?Do you feel safe going back to the place where you live?: Yes      ?Need for Family Participation in Patient Care: No (Comment) ?Care giver support system in place?: Yes (comment) ?  ?Criminal Activity/Legal Involvement Pertinent to Current Situation/Hospitalization: No - Comment as needed ? ?Activities of Daily Living ?Home Assistive Devices/Equipment: Oxygen, CPAP, Nebulizer, CBG Meter ?ADL Screening (condition at time of admission) ?Patient's cognitive ability adequate to safely complete daily activities?: No ?Is the patient deaf or have difficulty  hearing?: No ?Does the patient have difficulty seeing, even when wearing glasses/contacts?: No ?Does the patient have difficulty concentrating, remembering, or making decisions?: Yes ?Patient able to express need for assistance with ADLs?: Yes ?Does the patient have difficulty dressing or bathing?: No ?Independently performs ADLs?: Yes (appropriate for developmental age) ?Does the patient have difficulty walking or climbing stairs?: Yes ?Weakness of Legs: Both ?Weakness of Arms/Hands: Left ? ?Permission Sought/Granted ?Permission sought to share information with : Magazine features editor ?Permission granted to share information with : Yes, Verbal Permission Granted ?   ? Permission granted to share info w AGENCY: outpt rehab ?   ?   ? ?Emotional Assessment ?  ?Attitude/Demeanor/Rapport: Engaged ?Affect (typically observed): Pleasant ?Orientation: : Oriented to Self, Oriented to Place, Oriented to  Time, Oriented to Situation ?Alcohol / Substance Use: Not Applicable ?Psych Involvement: No (comment) ? ?Admission diagnosis:  Acute CHF (HCC) [I50.9] ?Patient Active Problem List  ? Diagnosis Date Noted  ? Acute CHF (HCC) 03/31/2021  ? Chronic respiratory failure with hypoxia (HCC) 03/01/2021  ? Diabetes mellitus (HCC) 10/06/2020  ? DOE (dyspnea on exertion) 05/20/2019  ? GERD (gastroesophageal reflux disease) 04/19/2019  ? Dysphagia 04/19/2019  ? Uncomplicated asthma 03/27/2019  ? Essential (primary) hypertension   ? Type 2 diabetes mellitus with hyperglycemia (HCC)   ? Allergic rhinitis, unspecified   ? Anxiety disorder, unspecified   ? Gastro-esophageal reflux disease with esophagitis   ? Gout,  unspecified   ? Morbid (severe) obesity with alveolar hypoventilation (HCC)   ? Obesity, unspecified   ? Polyosteoarthritis, unspecified   ? Sleep apnea, unspecified   ? 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 Weight gain 11/10/2017  ? Vitamin D deficiency 11/10/2017  ? Mixed hyperlipidemia  12/16/2014  ? Lichen sclerosus of penis 04/12/2013  ? COPD (chronic obstructive pulmonary disease) (HCC) 04/10/2013  ? Obstructive sleep apnea 04/10/2013  ? Essential hypertension, benign 04/07/2013  ? Uncontrolled type 2 diabetes mellitus with diabetic peripheral angiopathy without gangrene, with long-term current use of insulin 04/07/2013  ? Morbid obesity (HCC) 04/07/2013  ? Cellulitis 04/06/2013  ? ?PCP:  Suzan Slick, MD ?Pharmacy:   ?[facility] - [place], [place] - [address] ?[address] ?[place] [address] ?Phone: [phone] Fax: [phone] ? ? ? ? ?Social Determinants of Health (SDOH) Interventions ?  ? ?Readmission Risk Interventions ?   ? View : No data to display.  ?  ?  ?  ? ? ? ?

## 2021-04-02 NOTE — Progress Notes (Signed)
?PROGRESS NOTE ? ? ? ?Aaron Potter  ZOX:[id number] DOB: 09/07/1960 DOA: 03/31/2021 ?PCP: Aaron Potter ? ? ?Brief Narrative:  ?Per HPI: ?Aaron Potter is a 61 y.o. male with medical history significant of morbid obesity, hypertension, dyslipidemia, insulin-dependent type 2 diabetes, OSA on BiPAP, and GERD who presented to the ED with worsening shortness of breath and lower extremity edema after gaining what appears to be approximately 60 pounds in the last 1 month.  He has had a recent appointment with his cardiologist Dr. Odis Potter on 3/21 at which time he was noted to have a 17 pound weight gain since his last office visit.  He has remained compliant on his home diuretics and was changed over to torsemide from his usual Lasix on 3/21.  He is usually fairly short of breath with any amount of activity and appears to have worsening symptoms of sleep apnea.  He does not follow low-sodium diet.  Sister is at bedside.  He denies any chest pain, fevers, chills, or cough with sputum production. ? ?3/30: Patient was admitted with acute on chronic HFpEF and is massively volume overloaded.  He was started on IV Lasix with approximately 2 L of negative fluid balance overnight.  He has been seen by cardiology today and started on Lasix drip to assist with diuresis.  Repeat echocardiogram pending.  ? ? ?Assessment & Plan: ?  ?Principal Problem: ?  Acute CHF (HCC) ?Active Problems: ?  Morbid obesity (HCC) ?  Obstructive sleep apnea ?  Essential (primary) hypertension ?  Gastro-esophageal reflux disease with esophagitis ?  Diabetes mellitus (HCC) ?  Chronic respiratory failure with hypoxia (HCC) ? ?Assessment and Plan: ? ? ?Acute on chronic HFpEF (HCC) ?Appreciate ongoing cardiology evaluation ?2D echocardiogram: Limited study possible moderate AAS, ?-Remain on Lasix drip 8 mg/h, -4.3 L, creatinine remained stable ?Strict I's and O's ?Daily weights  ?Plan to hold olmesartan for now ?  ?Chronic respiratory failure with  hypoxia (HCC) ?-Reporting some improvement in shortness of breath currently on 4 L of oxygen, satting 98% ?Usually wears 3 L nasal cannula at home ?Interested in having a concentrator at discharge ?Continue as needed DuoNeb bronchodilator treatment ? ?Diabetes mellitus (HCC) ?Maintain on SSI while inpatient ?-Holding home medication metformin and glipizide ? ?  ?Gastro-esophageal reflux disease with esophagitis ?PPI ?  ?Essential (primary) hypertension ?Continue home verapamil, hold olmesartan while on aggressive IV diuresis ?Verapamil dose decreased due to sinus pauses ?  ?Obstructive sleep apnea ?Plan to keep on BiPAP overnight ?  ?Morbid obesity (HCC) ?Lifestyle changes outpatient ?-Discussed with the patient and his sisters at bedside regarding follow-up with PCP with aggressive weight loss program, dietary modification ?We will consult nutrition ?  ? ?DVT prophylaxis: Lovenox ?Code Status: Full ?Family Communication: Sisters at bedside 3/29 ?Disposition Plan:  ?Status is: Inpatient ?Remains inpatient appropriate because: Need for ongoing IV diuresis. ? ?Consultants:  ?Cardiology ? ?Procedures:  ?See below ? ?Antimicrobials:  ?None ? ? ?Objective: ?Vitals:  ? 04/01/21 2142 04/02/21 0500 04/02/21 0521 04/02/21 0859  ?BP: (!) 133/52  (!) 116/43   ?Pulse: 64  72   ?Resp: 19  20   ?Temp: 98 ?F (36.7 ?C)  97.7 ?F (36.5 ?C)   ?TempSrc:      ?SpO2: 94%  92% 98%  ?Weight:  (!) 220.6 kg    ?Height:      ? ? ?Intake/Output Summary (Last 24 hours) at 04/02/2021 1100 ?Last data filed at 04/02/2021 0500 ?Gross per 24 hour  ?Intake  720 ml  ?Output 4200 ml  ?Net -3480 ml  ? ?Filed Weights  ? 03/31/21 1046 04/01/21 0533 04/02/21 0500  ?Weight: (!) 222.7 kg (!) 221.5 kg (!) 220.6 kg  ? ? ? ? ?Physical Exam: ?  ?General:  Morbidly obese male  ?AAO x 3,  cooperative, no distress;   ?HEENT:  Normocephalic, PERRL, otherwise with in Normal limits   ?Neuro:  CNII-XII intact. , normal motor and sensation, reflexes intact   ?Lungs:    Clear to auscultation BL, Respirations unlabored,  ?No wheezes / crackles  ?Cardio:    S1/S2, RRR, No murmure, No Rubs or Gallops   ?Abdomen:  Soft, non-tender, bowel sounds active all four quadrants, ?no guarding or peritoneal signs.  ?Muscular  ?skeletal:  Limited exam -global generalized weaknesses ?- in bed, able to move all 4 extremities,   ?2+ pulses,  symmetric, ++2 pitting edema  ?Skin:  Dry, warm to touch, negative for any Rashes,  ?Wounds: Please see nursing documentation ?   ? ? ? ?Data Reviewed: I have personally reviewed following labs and imaging studies ? ?CBC: ?Recent Labs  ?Lab 03/31/21 ?1120 04/01/21 ?3664  ?WBC 8.0 9.1  ?NEUTROABS 5.2  --   ?HGB 13.5 13.2  ?HCT 47.0 44.9  ?MCV 102.0* 100.7*  ?PLT 303 316  ? ?Basic Metabolic Panel: ?Recent Labs  ?Lab 03/31/21 ?1120 04/01/21 ?4034 04/02/21 ?0450  ?NA 141 139 138  ?K 4.7 5.1 4.6  ?CL 100 94* 89*  ?CO2 38* 38* 39*  ?GLUCOSE 58* 196* 263*  ?BUN 17 17 17   ?CREATININE 1.01 1.01 1.02  ?CALCIUM 9.2 9.1 9.3  ?MG  --  2.0 1.7  ? ?GFR: ?Estimated Creatinine Clearance: 146.7 mL/min (by C-G formula based on SCr of 1.02 mg/dL). ?Liver Function Tests: ?Recent Labs  ?Lab 03/31/21 ?1120  ?AST 23  ?ALT 20  ?ALKPHOS 61  ?BILITOT 0.3  ?PROT 7.2  ?ALBUMIN 3.5  ? ? ?BNP (last 3 results) ?Recent Labs  ?  08/12/20 ?1036  ?PROBNP 122  ? ?HbA1C: ?No results for input(s): HGBA1C in the last 72 hours. ?CBG: ?Recent Labs  ?Lab 04/01/21 ?0730 04/01/21 ?1113 04/01/21 ?1606 04/01/21 ?2138 04/02/21 ?0731  ?GLUCAP 185* 266* 257* 263* 233*  ? ? ? ? ?Radiology Studies: ?DG Chest Port 1 View ? ?Result Date: 03/31/2021 ?CLINICAL DATA:  Shortness of breath. EXAM: PORTABLE CHEST 1 VIEW COMPARISON:  February 18, 2021. FINDINGS: Mild cardiomegaly is noted. Both lungs are clear. The visualized skeletal structures are unremarkable. IMPRESSION: Mild cardiomegaly.  No acute pulmonary abnormality seen. Electronically Signed   By: February 20, 2021 M.D.   On: 03/31/2021 11:52  ? ?ECHOCARDIOGRAM  COMPLETE ? ?Result Date: 04/01/2021 ?   ECHOCARDIOGRAM REPORT   Patient Name:   Aaron GANIM Date of Exam: 04/01/2021 Medical Rec #:  04/03/2021      Height:       69.5 in Accession #:    [id number]     Weight:       488.4 lb Date of Birth:  [birth year]      BSA:          3.034 m? Patient Age:    60 years       BP:           156/62 mmHg Patient Gender: M              HR:           70 bpm. Exam Location:  13/02/1962 Procedure:  2D Echo Indications:     CHF  History:         Patient has no prior history of Echocardiogram examinations.                  COPD, Signs/Symptoms:Dyspnea; Risk Factors:Hypertension and                  Diabetes.  Sonographer:     Mikki Harbor Referring Phys:  [id number] PRATIK [facility] Diagnosing Phys: Armanda Magic Potter  Sonographer Comments: Technically challenging study due to limited acoustic windows and patient is morbidly obese. IMPRESSIONS  1. Left ventricular ejection fraction cannot be estimated. Left ventricular endocardial border not optimally defined to evaluate regional wall motion. There is mild left ventricular hypertrophy. Left ventricular diastolic function could not be evaluated.  2. Right ventricular systolic function was not well visualized.  3. The mitral valve was not well visualized. No evidence of mitral valve regurgitation.  4. The aortic valve was not well visualized. Aortic valve regurgitation is not visualized.  5. Very poor and limited acoustical windows. Cannot assess for valvulat heart disease or LVF.  6. Consider definity contrast to better define LVF or TEE if clinically indicated. FINDINGS  Left Ventricle: Left ventricular ejection fraction cannot be estimated. Left ventricular endocardial border not optimally defined to evaluate regional wall motion. The left ventricular internal cavity size was normal in size. There is mild left ventricular hypertrophy. Left ventricular diastolic function could not be evaluated. Right Ventricle: Right ventricular systolic function  was not well visualized. Left Atrium: Left atrial size was not well visualized. Right Atrium: Right atrial size was not well visualized. Pericardium: There is no evidence of pericardial effusion. Mitral Valve: The

## 2021-04-02 NOTE — Progress Notes (Signed)
Inpatient Diabetes Program Recommendations ? ?AACE/ADA: New Consensus Statement on Inpatient Glycemic Control (2015) ? ?Target Ranges:  Prepandial:   less than 140 mg/dL ?     Peak postprandial:   less than 180 mg/dL (1-2 hours) ?     Critically ill patients:  140 - 180 mg/dL  ? ?Lab Results  ?Component Value Date  ? GLUCAP 233 (H) 04/02/2021  ? HGBA1C 8.1 (A) 01/13/2021  ? ? ?Review of Glycemic Control ? Latest Reference Range & Units 04/01/21 07:30 04/01/21 11:13 04/01/21 16:06 04/01/21 21:38 04/02/21 07:31  ?Glucose-Capillary 70 - 99 mg/dL 287 (H) 867 (H) 672 (H) 263 (H) 233 (H)  ?(H): Data is abnormally high ? ?Diabetes history: DM2 ?Outpatient Diabetes medications: U-500 130 units with Breakfast and lunch & 100 units with supper, Glipizide XL 5 mg QAM, Metformin 1000 BID, Bydureon 2 mg Qweek ?Current orders for Inpatient glycemic control: Novolog 0-20 units TID and 0-5 QHS ? ?Inpatient Diabetes Program Recommendations:   ? ?Levemir 20 units BID ? ?Will continue to follow while inpatient. ? ?Thank you, ?Dulce Sellar, MSN, RN ?Diabetes Coordinator ?Inpatient Diabetes Program ?[phone] (team pager from 8a-5p) ? ? ? ?

## 2021-04-02 NOTE — Progress Notes (Signed)
? ?Progress Note ? ?Patient Name: Marcos Edwardson ?Date of Encounter: 04/02/2021 ? ?[facility] Cardiologist: [facility] Dr Terri Skains ? ?Subjective  ? ?Some ongoing SOB ? ?Inpatient Medications  ?  ?Scheduled Meds: ? albuterol  2.5 mg Nebulization QID  ? allopurinol  300 mg Oral Daily  ? enoxaparin (LOVENOX) injection  110 mg Subcutaneous Q24H  ? insulin aspart  0-20 Units Subcutaneous TID WC  ? insulin aspart  0-5 Units Subcutaneous QHS  ? multivitamin with minerals  1 tablet Oral Daily  ? pantoprazole  40 mg Oral Daily  ? pravastatin  40 mg Oral Daily  ? sodium chloride flush  3 mL Intravenous Q12H  ? verapamil  180 mg Oral Daily  ? ?Continuous Infusions: ? sodium chloride    ? furosemide (LASIX) 200 mg in dextrose 5% 100 mL (2mg /mL) infusion 8 mg/hr (04/02/21 0300)  ? ?PRN Meds: ?sodium chloride, acetaminophen **OR** acetaminophen, albuterol, ALPRAZolam, diclofenac Sodium, ondansetron **OR** ondansetron (ZOFRAN) IV, oxyCODONE-acetaminophen, sodium chloride flush  ? ?Vital Signs  ?  ?Vitals:  ? 04/01/21 1955 04/01/21 2142 04/02/21 0500 04/02/21 0521  ?BP:  (!) 133/52  (!) 116/43  ?Pulse:  64  72  ?Resp:  19  20  ?Temp:  98 ?F (36.7 ?C)  97.7 ?F (36.5 ?C)  ?TempSrc:      ?SpO2: 92% 94%  92%  ?Weight:   (!) 220.6 kg   ?Height:      ? ? ?Intake/Output Summary (Last 24 hours) at 04/02/2021 [id number] ?Last data filed at 04/02/2021 0500 ?Gross per 24 hour  ?Intake 960 ml  ?Output 5300 ml  ?Net -4340 ml  ? ? ?  04/02/2021  ?  5:00 AM 04/01/2021  ?  5:33 AM 03/31/2021  ? 10:46 AM  ?Last 3 Weights  ?Weight (lbs) 486 lb 5.4 oz 488 lb 6.4 oz 491 lb  ?Weight (kg) 220.6 kg 221.537 kg 222.716 kg  ?   ? ?Telemetry  ?  ?SR, short episode sinus brady - Personally Reviewed ? ?ECG  ?  ?N/a - Personally Reviewed ? ?Physical Exam  ? ?GEN: No acute distress.   ?Neck: No JVD ?Cardiac: RRR, no murmurs, rubs, or gallops.  ?Respiratory: Clear to auscultation bilaterally. ?GI: Soft, nontender, non-distended  ?MS: 2+ bilateral LE edema ?Neuro:   Nonfocal  ?Psych: Normal affect  ? ?Labs  ?  ?High Sensitivity Troponin:  No results for input(s): TROPONINIHS in the last 720 hours.   ?Chemistry ?Recent Labs  ?Lab 03/31/21 ?1120 04/01/21 ?[id number] 04/02/21 ?0450  ?NA 141 139 138  ?K 4.7 5.1 4.6  ?CL 100 94* 89*  ?CO2 38* 38* 39*  ?GLUCOSE 58* 196* 263*  ?BUN 17 17 17   ?CREATININE 1.01 1.01 1.02  ?CALCIUM 9.2 9.1 9.3  ?MG  --  2.0 1.7  ?PROT 7.2  --   --   ?ALBUMIN 3.5  --   --   ?AST 23  --   --   ?ALT 20  --   --   ?ALKPHOS 61  --   --   ?BILITOT 0.3  --   --   ?GFRNONAA >60 >60 >60  ?ANIONGAP 3* 7 10  ?  ?Lipids No results for input(s): CHOL, TRIG, HDL, LABVLDL, LDLCALC, CHOLHDL in the last 168 hours.  ?Hematology ?Recent Labs  ?Lab 03/31/21 ?1120 04/01/21 ?[id number]  ?WBC 8.0 9.1  ?RBC 4.61 4.46  ?HGB 13.5 13.2  ?HCT 47.0 44.9  ?MCV 102.0* 100.7*  ?MCH 29.3 29.6  ?MCHC 28.7* 29.4*  ?  RDW 16.6* 16.6*  ?PLT 303 316  ? ?Thyroid No results for input(s): TSH, FREET4 in the last 168 hours.  ?BNP ?Recent Labs  ?Lab 03/31/21 ?1120  ?BNP 107.0*  ?  ?DDimer No results for input(s): DDIMER in the last 168 hours.  ? ?Radiology  ?  ?DG Chest Port 1 View ? ?Result Date: 03/31/2021 ?CLINICAL DATA:  Shortness of breath. EXAM: PORTABLE CHEST 1 VIEW COMPARISON:  February 18, 2021. FINDINGS: Mild cardiomegaly is noted. Both lungs are clear. The visualized skeletal structures are unremarkable. IMPRESSION: Mild cardiomegaly.  No acute pulmonary abnormality seen. Electronically Signed   By: Marijo Conception M.D.   On: 03/31/2021 11:52  ? ?ECHOCARDIOGRAM COMPLETE ? ?Result Date: 04/01/2021 ?   ECHOCARDIOGRAM REPORT   Patient Name:   PRINCETIN BIHL Date of Exam: 04/01/2021 Medical Rec #:  [id number]      Height:       69.5 in Accession #:    [id number]     Weight:       488.4 lb Date of Birth:  11/27/1960      BSA:          3.034 m? Patient Age:    61 years       BP:           156/62 mmHg Patient Gender: M              HR:           70 bpm. Exam Location:  [facility] Procedure: 2D Echo Indications:      CHF  History:         Patient has no prior history of Echocardiogram examinations.                  COPD, Signs/Symptoms:Dyspnea; Risk Factors:Hypertension and                  Diabetes.  Sonographer:     Wenda Low Referring Phys:  [id number] [facility] Diagnosing Phys: Fransico Him MD  Sonographer Comments: Technically challenging study due to limited acoustic windows and patient is morbidly obese. IMPRESSIONS  1. Left ventricular ejection fraction cannot be estimated. Left ventricular endocardial border not optimally defined to evaluate regional wall motion. There is mild left ventricular hypertrophy. Left ventricular diastolic function could not be evaluated.  2. Right ventricular systolic function was not well visualized.  3. The mitral valve was not well visualized. No evidence of mitral valve regurgitation.  4. The aortic valve was not well visualized. Aortic valve regurgitation is not visualized.  5. Very poor and limited acoustical windows. Cannot assess for valvulat heart disease or LVF.  6. Consider definity contrast to better define LVF or TEE if clinically indicated. FINDINGS  Left Ventricle: Left ventricular ejection fraction cannot be estimated. Left ventricular endocardial border not optimally defined to evaluate regional wall motion. The left ventricular internal cavity size was normal in size. There is mild left ventricular hypertrophy. Left ventricular diastolic function could not be evaluated. Right Ventricle: Right ventricular systolic function was not well visualized. Left Atrium: Left atrial size was not well visualized. Right Atrium: Right atrial size was not well visualized. Pericardium: There is no evidence of pericardial effusion. Mitral Valve: The mitral valve was not well visualized. No evidence of mitral valve regurgitation. Tricuspid Valve: The tricuspid valve is not well visualized. Tricuspid valve regurgitation is not demonstrated. Aortic Valve: The aortic valve was not well  visualized. Aortic valve regurgitation is not visualized.  Pulmonic Valve: The pulmonic valve was not assessed. Pulmonic valve regurgitation is not visualized. Aorta: Aortic root could not be assessed. Venous: The inferior vena cava was not well visualized. IAS/Shunts: No atrial level shunt detected by color flow Doppler.  LEFT VENTRICLE PLAX 2D LVIDd:         5.40 cm LVIDs:         2.60 cm LV PW:         1.30 cm LV IVS:        1.40 cm  LEFT ATRIUM         Index LA diam:    5.60 cm 1.85 cm/m? Fransico Him MD Electronically signed by Fransico Him MD Signature Date/Time: 04/01/2021/9:11:00 PM    Final (Updated)    ? ?Cardiac Studies  ? ? ? ?Patient Profile  ?Landy Bulger is a 61 y.o. male with a hx of HFpEF, HTN, HLD, IDDM, COPD, OSA and morbid obesity who is being seen 04/01/2021 for the evaluation of CHF at the request of Dr. Manuella Ghazi. ? ?Assessment & Plan  ?  ?1.Acute on chronic HFpEF ?- 04/2020 Echo: limited visualization, probably normal LVE. Possibly moderate AS ?- difficult study yesterday, only 24 images. In talking with echo tech study was uncomfortable for patient, tenderness to probe being on chest, study was discontinued.  ?- consider limited with contrast perhaps later in admission.  ?- on lasix drip at 8mg /hr, negative 4.3. Cr is stable. Fairly rapid diuresis, will lower drip to 7 mg/hr today, I think net negative 2-3.5 L per day would be goal.  ?- continue IV diuresis, will require several days.  ? ?For questions or updates, please contact [facility] ?Please consult [email or web address] for contact info under  ? ?  ?   ?Signed, ?Carlyle Dolly, MD  ?04/02/2021, 8:22 AM   ? ?

## 2021-04-02 NOTE — Progress Notes (Signed)
Upon entering patients room to administer medication this nurse noticed continuous IV Furosemide bag was empty. Notified MD through secure chat and was instructed to hang another bag.  ?

## 2021-04-02 NOTE — Progress Notes (Signed)
[place] with patient and at [place] patient eating. Nebulizer to be administered at a later time. ?

## 2021-04-02 NOTE — Progress Notes (Signed)
Initial Nutrition Assessment ? ?DOCUMENTATION CODES:  ? ?Morbid obesity ? ?INTERVENTION:  ?Education:  ?-Low sodium Nutrition Therapy ?-General Nutrition Guidelines for Diabetes ? ?Recommend patient follow up with Outpatient RD (if he is agreeable) ? ?NUTRITION DIAGNOSIS:  ?Food and nutrition related knowledge deficit related to limited prior education as evidenced by per patient/family report. ? ? ?GOAL:  ? (Patient will follow a CHO Modified/ Heart Healthy diet here and as he transitions home) ? ? ?MONITOR:  ?Weight trends, I & O's, Labs, PO intake ? ?REASON FOR ASSESSMENT:  ? ?Consult ?Diet education ? ?ASSESSMENT: Patient is an obese 61 yo male with history of DM2, COPD, GERD, Gout, Anxiety. Presents short of breath with severe wt gain (~60 lb) the past month per chart. Acute CHF.  ? ?Meals: Patient sister and nephew are bedside. She prepares meals for him and says, I raised him". Reviewed both low sodium and diabetes basics and answered questions. Provided RD contact number if needed. Weight loss desirable for patient is he is ready. Encouraged him to start with 1-2 habits (such as eliminating soft drinks, choosing sugar free beverages).  ? ?Weights  ?Filed Weights  ? 03/31/21 1046 04/01/21 0533 04/02/21 0500  ?Weight: (!) 222.7 kg (!) 221.5 kg (!) 220.6 kg  ?  ? ?Labs reviewed:  ? ?  Latest Ref Rng & Units 04/02/2021  ?  4:50 AM 04/01/2021  ?  5:24 AM 03/31/2021  ? 11:20 AM  ?BMP  ?Glucose 70 - 99 mg/dL 903   009   58    ?BUN 6 - 20 mg/dL 17   17   17     ?Creatinine 0.61 - 1.24 mg/dL   2.33   0.07    ?Sodium 135 - 145 mmol/L 138   139   141    ?Potassium 3.5 - 5.1 mmol/L 4.6   5.1   4.7    ?Chloride 98 - 111 mmol/L 89   94   100    ?CO2 22 - 32 mmol/L 39   38   38    ?Calcium 8.9 - 10.3 mg/dL 9.3   9.1   9.2    ?  ?CBG (last 3)  ?Recent Labs  ?  04/01/21 ?2138 04/02/21 ?0731 04/02/21 ?1156  ?GLUCAP 263* 233* 297*  ?  ?Medications: lasix 200 mg , Insulin, MVI, Protonix  ? ?Intake/Output Summary (Last 24  hours) at 04/02/2021 1534 ?Last data filed at 04/02/2021 1300 ?Gross per 24 hour  ?Intake 1356.54 ml  ?Output 4200 ml  ?Net -2843.46 ml  ?  ? ?NUTRITION - FOCUSED PHYSICAL EXAM: ?Partial Nutrition-Focused physical exam due to body habitus. Patient able to ambulate with PT -6' today. ? ? ?Diet Order:   ?Diet Order   ? ?       ?  Diet heart healthy/carb modified Room service appropriate? Yes; Fluid consistency: Thin  Diet effective now       ?  ? ?  ?  ? ?  ? ? ?EDUCATION NEEDS:  ?Education needs have been addressed ? ?Skin:  Skin Assessment: Reviewed RN Assessment ? ?Last BM:  3/29 ? ?Height:  ? ?Ht Readings from Last 1 Encounters:  ?03/31/21 5' 9.5" (1.765 m)  ? ? ?Weight:  ? ?Wt Readings from Last 1 Encounters:  ?04/02/21 (!) 220.6 kg  ? ? ?Ideal Body Weight:   75 kg ? ?BMI:  Body mass index is 70.79 kg/m?. ? ?Estimated Nutritional Needs:  ? ?Kcal:  2700-2900 (reduce by  500-800 kcal daily for wt loss) ? ?Protein:  140-160 gr ? ?Fluid:  per MD goal -given his volume overload on admission ? ?Royann Shivers MS,RD,CSG,LDN ?Contact: AMION  ?

## 2021-04-03 DIAGNOSIS — I509 Heart failure, unspecified: Secondary | ICD-10-CM | POA: Diagnosis not present

## 2021-04-03 DIAGNOSIS — J9611 Chronic respiratory failure with hypoxia: Secondary | ICD-10-CM | POA: Diagnosis not present

## 2021-04-03 DIAGNOSIS — K2100 Gastro-esophageal reflux disease with esophagitis, without bleeding: Secondary | ICD-10-CM | POA: Diagnosis not present

## 2021-04-03 DIAGNOSIS — E1169 Type 2 diabetes mellitus with other specified complication: Secondary | ICD-10-CM | POA: Diagnosis not present

## 2021-04-03 LAB — GLUCOSE, CAPILLARY
Glucose-Capillary: 244 mg/dL — ABNORMAL HIGH (ref 70–99)
Glucose-Capillary: 302 mg/dL — ABNORMAL HIGH (ref 70–99)
Glucose-Capillary: 314 mg/dL — ABNORMAL HIGH (ref 70–99)
Glucose-Capillary: 273 mg/dL — ABNORMAL HIGH (ref 70–99)

## 2021-04-03 LAB — BASIC METABOLIC PANEL
Anion gap: 12 (ref 5–15)
BUN: 15 mg/dL (ref 6–20)
CO2: 38 mmol/L — ABNORMAL HIGH (ref 22–32)
Calcium: 9.5 mg/dL (ref 8.9–10.3)
Chloride: 88 mmol/L — ABNORMAL LOW (ref 98–111)
Creatinine, Ser: 0.93 mg/dL (ref 0.61–1.24)
GFR, Estimated: >60 mL/min (ref >60)
Glucose, Bld: 256 mg/dL — ABNORMAL HIGH (ref 70–99)
Potassium: 4.6 mmol/L (ref 3.5–5.1)
Sodium: 138 mmol/L (ref 135–145)

## 2021-04-03 MED ORDER — FUROSEMIDE 10 MG/ML IJ SOLN
INTRAMUSCULAR | Status: AC
  Filled 2021-04-03: qty 20

## 2021-04-03 MED ORDER — METOLAZONE 5 MG PO TABS
2.5000 mg | ORAL_TABLET | Freq: Once | ORAL | Status: AC
  Administered 2021-04-03: 2.5 mg via ORAL
  Filled 2021-04-03: qty 1

## 2021-04-03 NOTE — Progress Notes (Signed)
PT was able to ambulate in the hallway with standby assist. PT made it from his room around the nurses station with some dyspnea. Pts o2 remained in the 90s.  ?

## 2021-04-03 NOTE — Progress Notes (Signed)
This nurse obtained standing weight on pt 214.5kg ?

## 2021-04-03 NOTE — ED Provider Notes (Signed)
?[name] ?Provider Note ? ? ?CSN: [id number] ?Arrival date & time: 03/31/21  1039 ? ?  ? ?History ? ?Chief Complaint  ?Patient presents with  ? Shortness of Breath  ? ? ?Aaron Potter is a 61 y.o. male. ? ?Patient has a history of diabetes and hypertension and congestive heart failure.  He stated he has gained about 60 pounds in the last couple week ? ?The history is provided by the patient and medical records. No language interpreter was used.  ?Shortness of Breath ?Severity:  Moderate ?Onset quality:  Sudden ?Timing:  Constant ?Progression:  Worsening ?Chronicity:  Recurrent ?Context: activity   ?Relieved by:  Nothing ?Worsened by:  Nothing ?Ineffective treatments:  None tried ?Associated symptoms: no abdominal pain, no chest pain, no cough, no headaches and no rash   ? ?  ? ?Home Medications ?Prior to Admission medications   ?Medication Sig Start Date End Date Taking? Authorizing Provider  ?albuterol (PROVENTIL) (2.5 MG/3ML) 0.083% nebulizer solution Take 3 mLs (2.5 mg total) by nebulization every 6 (six) hours as needed for wheezing or shortness of breath. 03/27/19  Yes Corum, Rex Kras, MD  ?allopurinol (ZYLOPRIM) 300 MG tablet Take 300 mg by mouth daily.     Yes [provider]  ?ALPRAZolam Duanne Moron) 0.5 MG tablet Take 1 tablet by mouth 2 (two) times daily as needed for anxiety.   Yes [provider]  ?BYDUREON BCISE 2 MG/0.85ML AUIJ INJECT INTO THE SKIN ONCE A WEEK. ?Patient taking differently: Inject 2 mg into the skin once a week. 03/30/21  Yes Cassandria Anger, MD  ?diclofenac Sodium (VOLTAREN) 1 % GEL Apply 2 g topically 4 (four) times daily as needed (pain).   Yes [provider]  ?glipiZIDE (GLUCOTROL XL) 5 MG 24 hr tablet TAKE 1 TABLET ONCE DAILY WITH BREAKFAST. ?Patient taking differently: Take 5 mg by mouth daily with breakfast. 03/30/21  Yes Nida, Marella Chimes, MD  ?insulin regular human CONCENTRATED (HUMULIN R U-500 KWIKPEN) 500 UNIT/ML KwikPen  Inject 100-130 Units into the skin 3 (three) times daily with meals. 01/13/21  Yes Nida, Marella Chimes, MD  ?metFORMIN (GLUCOPHAGE) 500 MG tablet TAKE 2 TABLETS BY MOUTH TWICE DAILY. ?Patient taking differently: Take 1,000 mg by mouth 2 (two) times daily with a meal. 01/29/21  Yes Nida, Marella Chimes, MD  ?Multiple Vitamin (MULTIVITAMIN WITH MINERALS) TABS tablet Take 1 tablet by mouth daily.   Yes [provider]  ?olmesartan (BENICAR) 20 MG tablet Take 1 tablet (20 mg total) by mouth daily. 09/14/20 03/31/21 Yes Tolia, Sunit, DO  ?oxyCODONE-acetaminophen (PERCOCET) 7.5-325 MG tablet Take 1 tablet by mouth every 6 (six) hours as needed for severe pain.   Yes [provider]  ?pantoprazole (PROTONIX) 40 MG tablet TAKE 1 TABLET DAILY, 30 MINUTES BEFORE BREAKFAST ` ?Patient taking differently: Take 40 mg by mouth daily. 03/30/21  Yes Annitta Needs, NP  ?pravastatin (PRAVACHOL) 40 MG tablet Take 40 mg by mouth daily.   Yes [provider]  ?torsemide (DEMADEX) 10 MG tablet Take 1 tablet (10 mg total) by mouth daily. 03/23/21 06/21/21 Yes Tolia, Sunit, DO  ?verapamil (CALAN-SR) 240 MG CR tablet Take 240 mg by mouth daily.   Yes [provider]  ?Vitamin D, Ergocalciferol, (DRISDOL) 1.25 MG (50000 UNIT) CAPS capsule Take 1 capsule (50,000 Units total) by mouth once a week. 04/01/19  Yes Corum, Rex Kras, MD  ?ACCU-CHEK AVIVA PLUS test strip USE TO CHECK BLOOD SUGAR 4 TIMES DAILY. 01/29/21  Cassandria Anger, MD  ?Accu-Chek FastClix Lancets MISC USE TO CHECK BLOOD SUGAR FOUR TIMES A DAY. 06/29/20   Cassandria Anger, MD  ?Blood Glucose Monitoring Suppl (ACCU-CHEK GUIDE) w/Device KIT 1 Piece by Does not apply route as directed. 01/22/19   Cassandria Anger, MD  ?GLOBAL EASE INJECT PEN NEEDLES 31G X 8 MM MISC USE 4 TIMES DAILY. 03/02/21   Cassandria Anger, MD  ?   ? ?Allergies    ?Biaxin [clarithromycin] and Ace inhibitors   ? ?Review of Systems   ?Review of Systems   ?Constitutional:  Negative for appetite change and fatigue.  ?HENT:  Negative for congestion, ear discharge and sinus pressure.   ?Eyes:  Negative for discharge.  ?Respiratory:  Positive for shortness of breath. Negative for cough.   ?Cardiovascular:  Negative for chest pain.  ?Gastrointestinal:  Negative for abdominal pain and diarrhea.  ?Genitourinary:  Negative for frequency and hematuria.  ?Musculoskeletal:  Negative for back pain.  ?Skin:  Negative for rash.  ?Neurological:  Negative for seizures and headaches.  ?Psychiatric/Behavioral:  Negative for hallucinations.   ? ?Physical Exam ?Updated Vital Signs ?BP 116/89 (BP Location: Left Arm)   Pulse (!) 56   Temp 98 ?F (36.7 ?C)   Resp 18   Ht 5' 9.5" (1.765 m)   Wt (!) 214.5 kg   SpO2 (!) 87%   BMI 68.83 kg/m?  ?Physical Exam ?Vitals and nursing note reviewed.  ?Constitutional:   ?   Appearance: He is well-developed.  ?HENT:  ?   Head: Normocephalic.  ?   Nose: Nose normal.  ?Eyes:  ?   General: No scleral icterus. ?   Conjunctiva/sclera: Conjunctivae normal.  ?Neck:  ?   Thyroid: No thyromegaly.  ?Cardiovascular:  ?   Rate and Rhythm: Normal rate and regular rhythm.  ?   Heart sounds: No murmur heard. ?  No friction rub. No gallop.  ?Pulmonary:  ?   Breath sounds: No stridor. No wheezing or rales.  ?Chest:  ?   Chest wall: No tenderness.  ?Abdominal:  ?   General: There is no distension.  ?   Tenderness: There is no abdominal tenderness. There is no rebound.  ?Musculoskeletal:     ?   General: Normal range of motion.  ?   Cervical back: Neck supple.  ?   Comments: Patient has 3+ edema all the way up to his groin  ?Lymphadenopathy:  ?   Cervical: No cervical adenopathy.  ?Skin: ?   Findings: No erythema or rash.  ?Neurological:  ?   Mental Status: He is alert and oriented to person, place, and time.  ?   Motor: No abnormal muscle tone.  ?   Coordination: Coordination normal.  ?Psychiatric:     ?   Behavior: Behavior normal.  ? ? ?ED Results / Procedures  / Treatments   ?Labs ?(all labs ordered are listed, but only abnormal results are displayed) ?Labs Reviewed  ?CBC WITH DIFFERENTIAL/PLATELET - Abnormal; Notable for the following components:  ?    Result Value  ? MCV 102.0 (*)   ? MCHC 28.7 (*)   ? RDW 16.6 (*)   ? All other components within normal limits  ?COMPREHENSIVE METABOLIC PANEL - Abnormal; Notable for the following components:  ? CO2 38 (*)   ? Glucose, Bld 58 (*)   ? Anion gap 3 (*)   ? All other components within normal limits  ?BRAIN NATRIURETIC PEPTIDE - Abnormal; Notable for  the following components:  ? B Natriuretic Peptide 107.0 (*)   ? All other components within normal limits  ?GLUCOSE, CAPILLARY - Abnormal; Notable for the following components:  ? Glucose-Capillary 31 (*)   ? All other components within normal limits  ?GLUCOSE, CAPILLARY - Abnormal; Notable for the following components:  ? Glucose-Capillary 35 (*)   ? All other components within normal limits  ?GLUCOSE, CAPILLARY - Abnormal; Notable for the following components:  ? Glucose-Capillary 39 (*)   ? All other components within normal limits  ?GLUCOSE, CAPILLARY - Abnormal; Notable for the following components:  ? Glucose-Capillary 69 (*)   ? All other components within normal limits  ?BASIC METABOLIC PANEL - Abnormal; Notable for the following components:  ? Chloride 94 (*)   ? CO2 38 (*)   ? Glucose, Bld 196 (*)   ? All other components within normal limits  ?CBC - Abnormal; Notable for the following components:  ? MCV 100.7 (*)   ? MCHC 29.4 (*)   ? RDW 16.6 (*)   ? All other components within normal limits  ?GLUCOSE, CAPILLARY - Abnormal; Notable for the following components:  ? Glucose-Capillary 145 (*)   ? All other components within normal limits  ?GLUCOSE, CAPILLARY - Abnormal; Notable for the following components:  ? Glucose-Capillary 145 (*)   ? All other components within normal limits  ?GLUCOSE, CAPILLARY - Abnormal; Notable for the following components:  ? Glucose-Capillary  185 (*)   ? All other components within normal limits  ?GLUCOSE, CAPILLARY - Abnormal; Notable for the following components:  ? Glucose-Capillary 266 (*)   ? All other components within normal limits  ?GLUCOSE, CAPILLARY -

## 2021-04-03 NOTE — Progress Notes (Signed)
?PROGRESS NOTE ? ? ? ?Aaron Potter  [id number] DOB: [birth year] DOA: 03/31/2021 ?PCP: Leeanne Rio, MD ? ? ?Subjective : ? ?The patient was seen and examined this morning, still complaining of excessive lower extremity edema, but improved shortness of breath at rest, ?Excessive shortness of breath with minimal exertion ? ?Brief Narrative:  ?Per HPI: ?Aaron Potter is a 61 y.o. male with medical history significant of morbid obesity, hypertension, dyslipidemia, insulin-dependent type 2 diabetes, OSA on BiPAP, and GERD who presented to the ED with worsening shortness of breath and lower extremity edema after gaining what appears to be approximately 60 pounds in the last 1 month.  He has had a recent appointment with his cardiologist Dr. Terri Skains on 3/21 at which time he was noted to have a 17 pound weight gain since his last office visit.  He has remained compliant on his home diuretics and was changed over to torsemide from his usual Lasix on 3/21.  He is usually fairly short of breath with any amount of activity and appears to have worsening symptoms of sleep apnea.  He does not follow low-sodium diet.  Sister is at bedside.  He denies any chest pain, fevers, chills, or cough with sputum production. ? ?3/30: Patient was admitted with acute on chronic HFpEF and is massively volume overloaded.  He was started on IV Lasix with approximately 2 L of negative fluid balance overnight.  He has been seen by cardiology today and started on Lasix drip to assist with diuresis.  Repeat echocardiogram pending.  ? ? ?Assessment & Plan: ?  ?Principal Problem: ?  Acute CHF ([place]) ?Active Problems: ?  Morbid obesity ([place]) ?  Obstructive sleep apnea ?  Essential (primary) hypertension ?  Gastro-esophageal reflux disease with esophagitis ?  Diabetes mellitus ([place]) ?  Chronic respiratory failure with hypoxia (HCC) ? ?Assessment and Plan: ? ? ?Acute on chronic HFpEF (HCC) ?Appreciate ongoing cardiology evaluation ?Documented  continue IV Lasix drip continue diuresing ? ?2D echocardiogram: Limited study possible moderate AAS, ?-Remain on Lasix drip 8 mg/h, -4.3 L, creatinine remained stable ?Strict I's and O's ?Daily weights  ?Weight change: -0.5 kg ?Daily weight not consistent ?Plan to hold olmesartan for now ?  ?Chronic respiratory failure with hypoxia (HCC) ?-Currently requiring 4 L of oxygen, satting 87% ?Goal is to maintain the patient at 92% ? ?Baseline- 3 L nasal cannula at home ?Interested in having a concentrator at discharge ?Continue as needed DuoNeb bronchodilator treatment ? ?Diabetes mellitus ([place]) ?Maintain on SSI while inpatient ?-Holding home medication metformin and glipizide ? ?  ?Gastro-esophageal reflux disease with esophagitis ?PPI ?  ?Essential (primary) hypertension ?Continue home verapamil,  ?-hold olmesartan while on aggressive IV diuresis ?-Verapamil dose decreased due to sinus pauses ?-Currently stable, monitoring ? ? ?Obstructive sleep apnea ?Plan to keep on BiPAP overnight ?  ?Morbid obesity ([place]) ?Lifestyle changes outpatient ?-Discussed with the patient and his sisters at bedside regarding follow-up with PCP with aggressive weight loss program, dietary modification ?We will consult nutrition ?  ? ?DVT prophylaxis: Lovenox ?Code Status: Full ?Family Communication: Sisters at bedside 3/31 ?Disposition Plan:  ?Status is: Inpatient ?Remains inpatient appropriate because: Need for ongoing IV diuresis. ? ?Consultants:  ?Cardiology ? ?Procedures:  ?See below ? ?Antimicrobials:  ?None ? ? ?Objective: ?Vitals:  ? 04/03/21 0436 04/03/21 0500 04/03/21 [id number] 04/03/21 0810  ?BP: 116/89     ?Pulse: (!) 56     ?Resp: 18     ?Temp: 98 ?F (36.7 ?C)     ?  TempSrc:      ?SpO2: 91%   (!) 87%  ?Weight:  (!) 220.1 kg (!) 214.5 kg   ?Height:      ? ? ?Intake/Output Summary (Last 24 hours) at 04/03/2021 1313 ?Last data filed at 04/03/2021 0907 ?Gross per 24 hour  ?Intake 720 ml  ?Output 1400 ml  ?Net -680 ml  ? ?Filed Weights  ?  04/02/21 0500 04/03/21 0500 04/03/21 0712  ?Weight: (!) 220.6 kg (!) 220.1 kg (!) 214.5 kg  ? ? ? ? ? ?Physical Exam: ?  ?General:  AAO x 3,  cooperative, no distress; morbidly obese male  ?HEENT:  Normocephalic, PERRL, otherwise with in Normal limits   ?Neuro:  CNII-XII intact. , normal motor and sensation, reflexes intact   ?Lungs:   Clear to auscultation BL, Respirations unlabored,  ?No wheezes / crackles  ?Cardio:    S1/S2, RRR, No murmure, No Rubs or Gallops   ?Abdomen:  Soft, non-tender, bowel sounds active all four quadrants, ?no guarding or peritoneal signs.  ?Muscular  ?skeletal:  Limited exam -global generalized weaknesses ?- in bed, able to move all 4 extremities,   ?2+ pulses,  symmetric, ++4 pitting edema  ?Skin:  Dry, warm to touch, negative for any Rashes,  ?Wounds: Please see nursing documentation ?   ? ? ?  ? ? ? ?Data Reviewed: I have personally reviewed following labs and imaging studies ? ?CBC: ?Recent Labs  ?Lab 03/31/21 ?1120 04/01/21 ?[id number]  ?WBC 8.0 9.1  ?NEUTROABS 5.2  --   ?HGB 13.5 13.2  ?HCT 47.0 44.9  ?MCV 102.0* 100.7*  ?PLT 303 316  ? ?Basic Metabolic Panel: ?Recent Labs  ?Lab 03/31/21 ?1120 04/01/21 ?[id number] 04/02/21 ?0450 04/03/21 ?0559  ?NA 141 139 138 138  ?K 4.7 5.1 4.6 4.6  ?CL 100 94* 89* 88*  ?CO2 38* 38* 39* 38*  ?GLUCOSE 58* 196* 263* 256*  ?BUN 17 17 17 15   ?CREATININE 1.01 1.01 1.02 0.93  ?CALCIUM 9.2 9.1 9.3 9.5  ?MG  --  2.0 1.7  --   ? ?GFR: ?Estimated Creatinine Clearance: 157.9 mL/min (by C-G formula based on SCr of 0.93 mg/dL). ?Liver Function Tests: ?Recent Labs  ?Lab 03/31/21 ?1120  ?AST 23  ?ALT 20  ?ALKPHOS 61  ?BILITOT 0.3  ?PROT 7.2  ?ALBUMIN 3.5  ? ? ?BNP (last 3 results) ?Recent Labs  ?  08/12/20 ?1036  ?PROBNP 122  ? ?HbA1C: ?No results for input(s): HGBA1C in the last 72 hours. ?CBG: ?Recent Labs  ?Lab 04/02/21 ?1156 04/02/21 ?1607 04/02/21 ?2257 04/03/21 ?[id number] 04/03/21 ?1121  ?GLUCAP 297* 311* 292* 244* 302*  ? ? ? ? ?Radiology Studies: ?ECHOCARDIOGRAM  COMPLETE ? ?Result Date: 04/01/2021 ?   ECHOCARDIOGRAM REPORT   Patient Name:   Aaron Potter ELKIND Date of Exam: 04/01/2021 Medical Rec #:  [id number]      Height:       69.5 in Accession #:    [id number]     Weight:       488.4 lb Date of Birth:  06-10-1960      BSA:          3.034 m? Patient Age:    53 years       BP:           156/62 mmHg Patient Gender: M              HR:           70 bpm. Exam Location:  [facility]  Procedure: 2D Echo Indications:     CHF  History:         Patient has no prior history of Echocardiogram examinations.                  COPD, Signs/Symptoms:Dyspnea; Risk Factors:Hypertension and                  Diabetes.  Sonographer:     Wenda Low Referring Phys:  [id number] [facility] Diagnosing Phys: Fransico Him MD  Sonographer Comments: Technically challenging study due to limited acoustic windows and patient is morbidly obese. IMPRESSIONS  1. Left ventricular ejection fraction cannot be estimated. Left ventricular endocardial border not optimally defined to evaluate regional wall motion. There is mild left ventricular hypertrophy. Left ventricular diastolic function could not be evaluated.  2. Right ventricular systolic function was not well visualized.  3. The mitral valve was not well visualized. No evidence of mitral valve regurgitation.  4. The aortic valve was not well visualized. Aortic valve regurgitation is not visualized.  5. Very poor and limited acoustical windows. Cannot assess for valvulat heart disease or LVF.  6. Consider definity contrast to better define LVF or TEE if clinically indicated. FINDINGS  Left Ventricle: Left ventricular ejection fraction cannot be estimated. Left ventricular endocardial border not optimally defined to evaluate regional wall motion. The left ventricular internal cavity size was normal in size. There is mild left ventricular hypertrophy. Left ventricular diastolic function could not be evaluated. Right Ventricle: Right ventricular systolic function  was not well visualized. Left Atrium: Left atrial size was not well visualized. Right Atrium: Right atrial size was not well visualized. Pericardium: There is no evidence of pericardial effusion. Mitral Valve: The mitral va

## 2021-04-04 ENCOUNTER — Telehealth: Payer: Self-pay | Admitting: Gastroenterology

## 2021-04-04 DIAGNOSIS — I509 Heart failure, unspecified: Secondary | ICD-10-CM | POA: Diagnosis not present

## 2021-04-04 DIAGNOSIS — J9611 Chronic respiratory failure with hypoxia: Secondary | ICD-10-CM | POA: Diagnosis not present

## 2021-04-04 DIAGNOSIS — R195 Other fecal abnormalities: Secondary | ICD-10-CM

## 2021-04-04 DIAGNOSIS — R1310 Dysphagia, unspecified: Secondary | ICD-10-CM

## 2021-04-04 DIAGNOSIS — E1169 Type 2 diabetes mellitus with other specified complication: Secondary | ICD-10-CM | POA: Diagnosis not present

## 2021-04-04 DIAGNOSIS — I10 Essential (primary) hypertension: Secondary | ICD-10-CM | POA: Diagnosis not present

## 2021-04-04 LAB — BASIC METABOLIC PANEL
Anion gap: 14 (ref 5–15)
BUN: 16 mg/dL (ref 6–20)
CO2: 38 mmol/L — ABNORMAL HIGH (ref 22–32)
Calcium: 9.3 mg/dL (ref 8.9–10.3)
Chloride: 84 mmol/L — ABNORMAL LOW (ref 98–111)
Creatinine, Ser: 0.95 mg/dL (ref 0.61–1.24)
GFR, Estimated: >60 mL/min (ref >60)
Glucose, Bld: 257 mg/dL — ABNORMAL HIGH (ref 70–99)
Potassium: 3.7 mmol/L (ref 3.5–5.1)
Sodium: 136 mmol/L (ref 135–145)

## 2021-04-04 LAB — GLUCOSE, CAPILLARY
Glucose-Capillary: 244 mg/dL — ABNORMAL HIGH (ref 70–99)
Glucose-Capillary: 374 mg/dL — ABNORMAL HIGH (ref 70–99)
Glucose-Capillary: 285 mg/dL — ABNORMAL HIGH (ref 70–99)
Glucose-Capillary: 405 mg/dL — ABNORMAL HIGH (ref 70–99)

## 2021-04-04 MED ORDER — VERAPAMIL HCL ER 240 MG PO TBCR
120.0000 mg | EXTENDED_RELEASE_TABLET | Freq: Every day | ORAL | Status: DC
  Administered 2021-04-05 – 2021-04-09 (×5): 120 mg via ORAL
  Filled 2021-04-04 (×5): qty 1

## 2021-04-04 MED ORDER — INSULIN DETEMIR 100 UNIT/ML ~~LOC~~ SOLN
30.0000 [IU] | Freq: Two times a day (BID) | SUBCUTANEOUS | Status: DC
  Administered 2021-04-04 – 2021-04-05 (×3): 30 [IU] via SUBCUTANEOUS
  Filled 2021-04-04 (×5): qty 0.3

## 2021-04-04 MED ORDER — METOLAZONE 5 MG PO TABS
2.5000 mg | ORAL_TABLET | Freq: Once | ORAL | Status: AC
  Administered 2021-04-05: 2.5 mg via ORAL
  Filled 2021-04-04: qty 1

## 2021-04-04 NOTE — Progress Notes (Signed)
1455 Patient, was unavailable, having his hair washed by family member. Nebulizer treatment will be administered at a later time. ?

## 2021-04-04 NOTE — Progress Notes (Signed)
?PROGRESS NOTE ? ? ? ?Aaron Potter  [id number] DOB: 03/12/1960 DOA: 03/31/2021 ?PCP: Suzan Slick, MD ? ? ?Subjective : ? ?Patient was seen and examined this morning, sitting in the side of bed, extensive shortness of air with minimal exertion ? ?Per patient he has been diuresing well ? ?Still on 5 L of oxygen, satting 90% ?He was given a dose of acetazolamide last night he tolerated ? ?Brief Narrative:  ?Per HPI: ?Aaron Potter is a 61 y.o. male with medical history significant of morbid obesity, hypertension, dyslipidemia, insulin-dependent type 2 diabetes, OSA on BiPAP, and GERD who presented to the ED with worsening shortness of breath and lower extremity edema after gaining what appears to be approximately 60 pounds in the last 1 month.  He has had a recent appointment with his cardiologist Dr. Odis Hollingshead on 3/21 at which time he was noted to have a 17 pound weight gain since his last office visit.  He has remained compliant on his home diuretics and was changed over to torsemide from his usual Lasix on 3/21.  He is usually fairly short of breath with any amount of activity and appears to have worsening symptoms of sleep apnea.  He does not follow low-sodium diet.  Sister is at bedside.  He denies any chest pain, fevers, chills, or cough with sputum production. ? ?3/30: Patient was admitted with acute on chronic HFpEF and is massively volume overloaded.  He was started on IV Lasix with approximately 2 L of negative fluid balance overnight.  He has been seen by cardiology today and started on Lasix drip to assist with diuresis.  Repeat echocardiogram pending.  ? ? ?Assessment & Plan: ?  ?Principal Problem: ?  Acute CHF (HCC) ?Active Problems: ?  Morbid obesity (HCC) ?  Obstructive sleep apnea ?  Essential (primary) hypertension ?  Gastro-esophageal reflux disease with esophagitis ?  Diabetes mellitus (HCC) ?  Chronic respiratory failure with hypoxia (HCC) ? ?Assessment and Plan: ? ? ?Acute on chronic HFpEF  (HCC) ?Severely volume overload ?-Stents of anasarca, shortness of breath, requiring 5 L of oxygen satting 90% ? ?-Still on Lasix drip, 1 dose of acetazolamide was given on 04/03/2021 ?Well approximately 4 L out ? ? ?Documented continue IV Lasix drip continue diuresing ? ?2D echocardiogram: Limited study possible moderate AAS, ?-Remain on Lasix drip 8 mg/h, 3.696 L, creatinine remained stable ?Strict I's and O's ?Daily weights: 220 >> 214.5>> 209.8 kg today ? ? ?Daily weight not consistent ?Plan to hold olmesartan for now ?  ?Chronic respiratory failure with hypoxia (HCC) ?-Still requiring 5 L of oxygen, satting 90% ?Goal is to maintain the patient at 92% ? ?Baseline- 3 L nasal cannula at home ?Interested in having a concentrator at discharge ?Continue as needed DuoNeb bronchodilator treatment ? ?Diabetes mellitus (HCC) ?Maintain on SSI while inpatient ?-Holding home medication metformin and glipizide ?Adding long-acting insulin for better glycemic control ? ?  ?Gastro-esophageal reflux disease with esophagitis ?PPI ?  ?Essential (primary) hypertension ?Continue home verapamil,  ?-hold olmesartan while on aggressive IV diuresis ?-Verapamil dose decreased due to sinus pauses ?-Currently stable, monitoring ? ? ?Obstructive sleep apnea ?Plan to keep on BiPAP overnight ?-Compliant ?  ?Morbid obesity (HCC) ?Lifestyle changes outpatient ?-Discussed with the patient and his sisters at bedside regarding follow-up with PCP with aggressive weight loss program, dietary modification ?We will consult nutrition ?  ? ?DVT prophylaxis: Lovenox ?Code Status: Full ?Family Communication: Sisters at bedside 3/31 ?Disposition Plan:  ?Status is: Inpatient ?Remains  inpatient appropriate because: Need for ongoing IV diuresis. ? ?Consultants:  ?Cardiology ? ?Procedures:  ?See below ? ?Antimicrobials:  ?None ? ? ?Objective: ?Vitals:  ? 04/03/21 1942 04/03/21 2106 04/04/21 0610 04/04/21 0730  ?BP:  105/60 (!) 101/45   ?Pulse:  69 78   ?Resp:   19 16   ?Temp:  97.7 ?F (36.5 ?C) 98.4 ?F (36.9 ?C)   ?TempSrc:      ?SpO2: 91% 95% 92% 90%  ?Weight:   (!) 209.8 kg   ?Height:      ? ? ?Intake/Output Summary (Last 24 hours) at 04/04/2021 1102 ?Last data filed at 04/04/2021 0900 ?Gross per 24 hour  ?Intake 1003.6 ml  ?Output 4000 ml  ?Net -2996.4 ml  ? ?Filed Weights  ? 04/03/21 0500 04/03/21 0712 04/04/21 0610  ?Weight: (!) 220.1 kg (!) 214.5 kg (!) 209.8 kg  ? ? ? ? ?Physical Exam: ?  ?General:  AAO x 3,  cooperative, no distress; morbidly obese male  ?HEENT:  Normocephalic, PERRL, otherwise with in Normal limits   ?Neuro:  CNII-XII intact. , normal motor and sensation, reflexes intact   ?Lungs:   Clear to auscultation BL, Respirations unlabored,  ?No wheezes / crackles  ?Cardio:    S1/S2, RRR, No murmure, No Rubs or Gallops   ?Abdomen:  Soft, non-tender, bowel sounds active all four quadrants, ?no guarding or peritoneal signs.  ?Muscular  ?skeletal:  Limited exam -global generalized weaknesses ?- in bed, able to move all 4 extremities,   ?2+ pulses,  symmetric, +4  pitting edema  ?Skin:  Dry, warm to touch, negative for any Rashes,  ?Wounds: Please see nursing documentation ?   ? ? ?  ? ? ?Data Reviewed: I have personally reviewed following labs and imaging studies ? ?CBC: ?Recent Labs  ?Lab 03/31/21 ?1120 04/01/21 ?6160  ?WBC 8.0 9.1  ?NEUTROABS 5.2  --   ?HGB 13.5 13.2  ?HCT 47.0 44.9  ?MCV 102.0* 100.7*  ?PLT 303 316  ? ?Basic Metabolic Panel: ?Recent Labs  ?Lab 03/31/21 ?1120 04/01/21 ?7371 04/02/21 ?0450 04/03/21 ?0559 04/04/21 ?0533  ?NA 141 139 138 138 136  ?K 4.7 5.1 4.6 4.6 3.7  ?CL 100 94* 89* 88* 84*  ?CO2 38* 38* 39* 38* 38*  ?GLUCOSE 58* 196* 263* 256* 257*  ?BUN 17 17 17 15 16   ?CREATININE 1.01 1.01 1.02 0.93 0.95  ?CALCIUM 9.2 9.1 9.3 9.5 9.3  ?MG  --  2.0 1.7  --   --   ? ?GFR: ?Estimated Creatinine Clearance: 152.4 mL/min (by C-G formula based on SCr of 0.95 mg/dL). ?Liver Function Tests: ?Recent Labs  ?Lab 03/31/21 ?1120  ?AST 23  ?ALT 20   ?ALKPHOS 61  ?BILITOT 0.3  ?PROT 7.2  ?ALBUMIN 3.5  ? ? ?BNP (last 3 results) ?Recent Labs  ?  08/12/20 ?1036  ?PROBNP 122  ? ?HbA1C: ?No results for input(s): HGBA1C in the last 72 hours. ?CBG: ?Recent Labs  ?Lab 04/03/21 ?0723 04/03/21 ?1121 04/03/21 ?1615 04/03/21 ?2132 04/04/21 ?06/04/21  ?GLUCAP 244* 302* 314* 273* 244*  ? ? ? ? ?Radiology Studies: ?No results found. ? ? ? ? ? ?Scheduled Meds: ? albuterol  2.5 mg Nebulization TID  ? allopurinol  300 mg Oral Daily  ? enoxaparin (LOVENOX) injection  110 mg Subcutaneous Q24H  ? insulin aspart  0-20 Units Subcutaneous TID WC  ? insulin aspart  0-5 Units Subcutaneous QHS  ? insulin detemir  30 Units Subcutaneous BID  ? [START ON  04/05/2021] metolazone  2.5 mg Oral Once  ? multivitamin with minerals  1 tablet Oral Daily  ? pantoprazole  40 mg Oral Daily  ? pravastatin  40 mg Oral Daily  ? sodium chloride flush  3 mL Intravenous Q12H  ? [START ON 04/05/2021] verapamil  120 mg Oral Daily  ? ?Continuous Infusions: ? sodium chloride    ? furosemide (LASIX) 200 mg in dextrose 5% 100 mL (2mg /mL) infusion 7 mg/hr (04/02/21 1253)  ? ? ? LOS: 4 days  ? ? ?Time spent: 35 minutes ? ? ? ?Kendell BaneSeyed A Camrin Gearheart, MD ?[facility] ? ?If 7PM-7AM, please contact night-coverage ?[email or web address] ?04/04/2021, 11:02 AM  ? ?

## 2021-04-04 NOTE — Telephone Encounter (Signed)
[facility] clinical pool: ? ?Patient is currently admitted at [facility] due to fluid overload. He was referred to [place] by his PCP due to positive Cologuard. He also notes dysphagia. He is not a candidate for anesthesia here at [facility] due to BMI and comorbidities.  ? ?He will need to be referred to tertiary care for colonoscopy/EGD. BMI was 71 at office visit.  ?

## 2021-04-05 ENCOUNTER — Other Ambulatory Visit: Payer: Self-pay

## 2021-04-05 ENCOUNTER — Other Ambulatory Visit (HOSPITAL_COMMUNITY): Payer: Self-pay | Admitting: *Deleted

## 2021-04-05 ENCOUNTER — Inpatient Hospital Stay (HOSPITAL_COMMUNITY): Payer: Medicaid Other

## 2021-04-05 DIAGNOSIS — E1169 Type 2 diabetes mellitus with other specified complication: Secondary | ICD-10-CM | POA: Diagnosis not present

## 2021-04-05 DIAGNOSIS — R0609 Other forms of dyspnea: Secondary | ICD-10-CM

## 2021-04-05 DIAGNOSIS — I509 Heart failure, unspecified: Secondary | ICD-10-CM | POA: Diagnosis not present

## 2021-04-05 DIAGNOSIS — I5033 Acute on chronic diastolic (congestive) heart failure: Secondary | ICD-10-CM | POA: Diagnosis not present

## 2021-04-05 DIAGNOSIS — J9611 Chronic respiratory failure with hypoxia: Secondary | ICD-10-CM | POA: Diagnosis not present

## 2021-04-05 LAB — GLUCOSE, CAPILLARY
Glucose-Capillary: 293 mg/dL — ABNORMAL HIGH (ref 70–99)
Glucose-Capillary: 344 mg/dL — ABNORMAL HIGH (ref 70–99)
Glucose-Capillary: 346 mg/dL — ABNORMAL HIGH (ref 70–99)
Glucose-Capillary: 366 mg/dL — ABNORMAL HIGH (ref 70–99)
Glucose-Capillary: 372 mg/dL — ABNORMAL HIGH (ref 70–99)

## 2021-04-05 LAB — BASIC METABOLIC PANEL
Anion gap: 10 (ref 5–15)
BUN: 21 mg/dL — ABNORMAL HIGH (ref 6–20)
CO2: 43 mmol/L — ABNORMAL HIGH (ref 22–32)
Calcium: 9.3 mg/dL (ref 8.9–10.3)
Chloride: 82 mmol/L — ABNORMAL LOW (ref 98–111)
Creatinine, Ser: 0.99 mg/dL (ref 0.61–1.24)
GFR, Estimated: >60 mL/min (ref >60)
Glucose, Bld: 321 mg/dL — ABNORMAL HIGH (ref 70–99)
Potassium: 3.7 mmol/L (ref 3.5–5.1)
Sodium: 135 mmol/L (ref 135–145)

## 2021-04-05 MED ORDER — INSULIN DETEMIR 100 UNIT/ML ~~LOC~~ SOLN
40.0000 [IU] | Freq: Two times a day (BID) | SUBCUTANEOUS | Status: DC
  Administered 2021-04-05 – 2021-04-06 (×2): 40 [IU] via SUBCUTANEOUS
  Filled 2021-04-05 (×4): qty 0.4

## 2021-04-05 MED ORDER — PERFLUTREN LIPID MICROSPHERE
1.0000 mL | INTRAVENOUS | Status: AC | PRN
  Administered 2021-04-05: 10 mL via INTRAVENOUS
  Filled 2021-04-05: qty 10

## 2021-04-05 MED ORDER — INSULIN ASPART 100 UNIT/ML IJ SOLN
5.0000 [IU] | Freq: Once | INTRAMUSCULAR | Status: AC
  Administered 2021-04-05: 5 [IU] via SUBCUTANEOUS

## 2021-04-05 NOTE — Progress Notes (Signed)
? ?Progress Note ? ?Patient Name: Aaron Potter ?Date of Encounter: 04/05/2021 ? ?[facility] Cardiologist: Tessa Lerner, DO  ? ?Subjective  ? ?SOB is improving.  ? ?Inpatient Medications  ?  ?Scheduled Meds: ? albuterol  2.5 mg Nebulization TID  ? allopurinol  300 mg Oral Daily  ? enoxaparin (LOVENOX) injection  110 mg Subcutaneous Q24H  ? insulin aspart  0-20 Units Subcutaneous TID WC  ? insulin aspart  0-5 Units Subcutaneous QHS  ? insulin detemir  30 Units Subcutaneous BID  ? metolazone  2.5 mg Oral Once  ? multivitamin with minerals  1 tablet Oral Daily  ? pantoprazole  40 mg Oral Daily  ? pravastatin  40 mg Oral Daily  ? sodium chloride flush  3 mL Intravenous Q12H  ? verapamil  120 mg Oral Daily  ? ?Continuous Infusions: ? sodium chloride    ? furosemide (LASIX) 200 mg in dextrose 5% 100 mL (2mg /mL) infusion 7 mg/hr (04/04/21 1500)  ? ?PRN Meds: ?sodium chloride, acetaminophen **OR** acetaminophen, albuterol, ALPRAZolam, diclofenac Sodium, ondansetron **OR** ondansetron (ZOFRAN) IV, oxyCODONE-acetaminophen, sodium chloride flush  ? ?Vital Signs  ?  ?Vitals:  ? 04/04/21 2016 04/04/21 2101 04/05/21 0547 04/05/21 0708  ?BP:  (!) 137/52 (!) 105/42   ?Pulse:  73 66   ?Resp:  18 18   ?Temp:   98 ?F (36.7 ?C)   ?TempSrc:   Oral   ?SpO2: 91% 92% 92% 92%  ?Weight:    (!) 207.2 kg  ?Height:      ? ? ?Intake/Output Summary (Last 24 hours) at 04/05/2021 0803 ?Last data filed at 04/04/2021 2111 ?Gross per 24 hour  ?Intake 870.28 ml  ?Output 1250 ml  ?Net -379.72 ml  ? ? ?  04/05/2021  ?  7:08 AM 04/04/2021  ?  6:10 AM 04/03/2021  ?  7:12 AM  ?Last 3 Weights  ?Weight (lbs) 456 lb 14.4 oz 462 lb 8 oz 472 lb 14.2 oz  ?Weight (kg) 207.248 kg 209.789 kg 214.5 kg  ?   ? ?Telemetry  ?  ?SR - Personally Reviewed ? ?ECG  ?  ?N/a - Personally Reviewed ? ?Physical Exam  ? ?GEN: No acute distress.   ?Neck: No JVD ?Cardiac: RRR, no murmurs, rubs, or gallops.  ?Respiratory: Clear to auscultation bilaterally. ?GI: Soft, nontender, non-distended   ?MS: 1-2+bilateral LE eddema ?Neuro:  Nonfocal  ?Psych: Normal affect  ? ?Labs  ?  ?High Sensitivity Troponin:  No results for input(s): TROPONINIHS in the last 720 hours.   ?Chemistry ?Recent Labs  ?Lab 03/31/21 ?1120 04/01/21 ?04/03/21 04/02/21 ?0450 04/03/21 ?0559 04/04/21 ?06/04/21 04/05/21 ?06/05/21  ?NA 141 139 138 138 136 135  ?K 4.7 5.1 4.6 4.6 3.7 3.7  ?CL 100 94* 89* 88* 84* 82*  ?CO2 38* 38* 39* 38* 38* 43*  ?GLUCOSE 58* 196* 263* 256* 257* 321*  ?BUN 17 17 17 15 16  21*  ?CREATININE 1.01 1.01 1.02 0.93 0.95 0.99  ?CALCIUM 9.2 9.1 9.3 9.5 9.3 9.3  ?MG  --  2.0 1.7  --   --   --   ?PROT 7.2  --   --   --   --   --   ?ALBUMIN 3.5  --   --   --   --   --   ?AST 23  --   --   --   --   --   ?ALT 20  --   --   --   --   --   ?  ALKPHOS 61  --   --   --   --   --   ?BILITOT 0.3  --   --   --   --   --   ?GFRNONAA >60 >60 >60 >60 >60 >60  ?ANIONGAP 3* 7 10 12 14 10   ?  ?Lipids No results for input(s): CHOL, TRIG, HDL, LABVLDL, LDLCALC, CHOLHDL in the last 168 hours.  ?Hematology ?Recent Labs  ?Lab 03/31/21 ?1120 04/01/21 ?04/03/21  ?WBC 8.0 9.1  ?RBC 4.61 4.46  ?HGB 13.5 13.2  ?HCT 47.0 44.9  ?MCV 102.0* 100.7*  ?MCH 29.3 29.6  ?MCHC 28.7* 29.4*  ?RDW 16.6* 16.6*  ?PLT 303 316  ? ?Thyroid No results for input(s): TSH, FREET4 in the last 168 hours.  ?BNP ?Recent Labs  ?Lab 03/31/21 ?1120  ?BNP 107.0*  ?  ?DDimer No results for input(s): DDIMER in the last 168 hours.  ? ?Radiology  ?  ?No results found. ? ?Cardiac Studies  ? ? ? ?Patient Profile  ?   ?Taiki Buckwalter is a 61 y.o. male with a hx of HFpEF, HTN, HLD, IDDM, COPD, OSA and morbid obesity who is being seen 04/01/2021 for the evaluation of CHF at the request of Dr. 04/03/2021. ? ?Assessment & Plan  ?  ?1.Acute on chronic HFpEF ?- 04/2020 Echo: limited visualization, probably normal LVEF. Possibly moderate AS ?- difficult echo this admit only 24 images. In talking with echo tech study was uncomfortable for patient, tenderness to probe being on chest, study was discontinued.  ?-  retry echo today with contrast ? ?- on lasix drip at 7mg /hr, negative 05/2020 yesterday per charting, neg 11.9 L since admission. Stable Cr, mild uptrend in BUN, uptrend in bicarb. If weights accurate down from admit 491 to 456 lbs. Baseline unlceaer as was 458 in Aug but 439 listed in September. Primary team had dosed metoalzone 2.5mg  on 4/1, due for repeat dose today which is reasonable. Would also increase lasix drip to 8mg /hr today.  ? ? ?Ongoing fluid overload, suspect few more days of IV diuresis.   ? ?For questions or updates, please contact [facility] ?Please consult [email or web address] for contact info under  ? ?  ?   ?Signed, ?October, MD  ?04/05/2021, 8:03 AM   ? ?

## 2021-04-05 NOTE — Telephone Encounter (Signed)
Referral sent to [facility]   

## 2021-04-05 NOTE — Progress Notes (Signed)
Assisted patient with changing out straps on his mask and put water in his machine and checked O2 hooked to mask.  Machine is set for patient to put himself on tonight. ?

## 2021-04-05 NOTE — Progress Notes (Signed)
Inpatient Diabetes Program Recommendations ? ?AACE/ADA: New Consensus Statement on Inpatient Glycemic Control  ? ?Target Ranges:  Prepandial:   less than 140 mg/dL ?     Peak postprandial:   less than 180 mg/dL (1-2 hours) ?     Critically ill patients:  140 - 180 mg/dL  ? ? Latest Reference Range & Units 04/05/21 00:20 04/05/21 07:19  ?Glucose-Capillary 70 - 99 mg/dL 073 (H) 710 (H)  ? ? Latest Reference Range & Units 04/04/21 07:11 04/04/21 11:18 04/04/21 16:26 04/04/21 21:50  ?Glucose-Capillary 70 - 99 mg/dL 626 (H) 948 (H) 546 (H) 405 (H)  ? ?Review of Glycemic Control ? ?Diabetes history: DM2 ?Outpatient Diabetes medications: Humulin R U500 130 units with breakfast, 100 units with lunch and supper, Metformin 1000 mg BID, Glipizide XL 5 mg daily, Bydureon 2 mg Qweek  ?Current orders for Inpatient glycemic control: Levemir 30 units BID, Novolog 0-20 units TID with meals, Novolog 0-5 units QHS ? ?Inpatient Diabetes Program Recommendations:   ? ?Insulin: Please consider increasing Levemir to 40 units BID and ordering Novolog 5 units TID with meals for meal coverage if patient eats at least 50% of meals. ? ?Thanks, ?Orlando Penner, RN, MSN, CDE ?Diabetes Coordinator ?Inpatient Diabetes Program ?[phone] (Team Pager from 8am to 5pm) ? ? ? ?

## 2021-04-05 NOTE — Progress Notes (Signed)
?PROGRESS NOTE ? ? ? ?Aaron Potter  [id number] DOB: September 19, 1960 DOA: 03/31/2021 ?PCP: Leeanne Rio, MD ? ? ?Subjective : ? ?The patient was seen and examined this morning..  Sitting on side of bed, still complain of shortness of breath, on 5 L of oxygen, satting 94% ? ?CBG elevated this morning to 93, 372 ? ?Brief Narrative:  ?Per HPI: ?Aaron Potter is a 61 y.o. male with medical history significant of morbid obesity, hypertension, dyslipidemia, insulin-dependent type 2 diabetes, OSA on BiPAP, and GERD who presented to the ED with worsening shortness of breath and lower extremity edema after gaining what appears to be approximately 60 pounds in the last 1 month.  He has had a recent appointment with his cardiologist Dr. Terri Skains on 3/21 at which time he was noted to have a 17 pound weight gain since his last office visit.  He has remained compliant on his home diuretics and was changed over to torsemide from his usual Lasix on 3/21.  He is usually fairly short of breath with any amount of activity and appears to have worsening symptoms of sleep apnea.  He does not follow low-sodium diet.  Sister is at bedside.  He denies any chest pain, fevers, chills, or cough with sputum production. ? ?3/30: Patient was admitted with acute on chronic HFpEF and is massively volume overloaded.  He was started on IV Lasix with approximately 2 L of negative fluid balance overnight.  He has been seen by cardiology today and started on Lasix drip to assist with diuresis.  Repeat echocardiogram pending.  ? ? ?Assessment & Plan: ?  ?Principal Problem: ?  Acute CHF ([place]) ?Active Problems: ?  Morbid obesity ([place]) ?  Obstructive sleep apnea ?  Essential (primary) hypertension ?  Gastro-esophageal reflux disease with esophagitis ?  Diabetes mellitus ([place]) ?  Chronic respiratory failure with hypoxia (HCC) ? ?Assessment and Plan: ? ? ?Acute on chronic HFpEF (HCC) ?Anasarca-severely volume overload ?-Continue show signs of anasarca,  shortness of breath ?Requiring 5 L of oxygen satting 94% ? ?-Per his wife baseline O2 demand 4 L ? ?-Still on Lasix drip, 1 dose of acetazolamide was given on 04/03/2021 ?Second dose of acetazolamide scheduled for today for 323 ? ?-Cardiology following, increased the rate of IV Lasix drip ? ? ?Filed Weights  ? 04/03/21 [id number] 04/04/21 0610 04/05/21 0708  ?Weight: (!) 214.5 kg (!) 209.8 kg (!) 207.2 kg  ? ?Intake/Output Summary (Last 24 hours) at 04/05/2021 1244 ?Last data filed at 04/05/2021 1100 ?Gross per 24 hour  ?Intake 1110.28 ml  ?Output 2750 ml  ?Net -1639.72 ml  ? ? ? ? ?2D echocardiogram: Limited study possible moderate AAS, ?Suboptimal ?-Per cardiology requesting repeating 2D echo with contrast today for 323 ? ?-Remain on Lasix drip 7 mg/h,  ? ?Strict I's and O's ?-It appears the patient has lost close to 12 L since admission ?Daily weights: 220 >> 214.5>> 209.8 >>207.2 kg today ? ? ?Holding Olmesartan for now ?  ? ?Chronic respiratory failure with hypoxia (HCC) ?-Still requiring 5 L of oxygen, satting 94% ?Goal is to maintain the patient at 92% ? ?Baseline- 4 L nasal cannula at home ?Interested in having a concentrator at discharge ?Continue as needed DuoNeb bronchodilator treatment ? ?Diabetes mellitus ([place]) ?Hyperglycemic ?-Holding home medication metformin and glipizide ?-Elevated blood sugars..  ?-Increasing basal insulin -Levemir from 30 to 40 units twice daily ?Along with SSI coverage ? ? ?  ?Gastro-esophageal reflux disease with esophagitis ?PPI ?  ?Essential (  primary) hypertension ?-Blood pressure soft, monitoring ?Continue home verapamil,  ?-hold olmesartan while on aggressive IV diuresis ?-Verapamil dose decreased due to sinus pauses ?-Currently stable, monitoring ? ? ?Obstructive sleep apnea ?Plan to keep on BiPAP overnight ?-Compliant ?  ?Morbid obesity ([place]) ?Lifestyle changes outpatient ?-Discussed with the patient and his sisters at bedside regarding follow-up with PCP with aggressive weight loss  program, dietary modification ?We will consult nutrition ?  ?Filed Weights  ? 04/03/21 [id number] 04/04/21 0610 04/05/21 0708  ?Weight: (!) 214.5 kg (!) 209.8 kg (!) 207.2 kg  ? ? ?DVT prophylaxis: Lovenox ?Code Status: Full ?Family Communication: Sisters at bedside 3/31 ?Disposition Plan:  ?Status is: Inpatient ?Remains inpatient appropriate because: Need for ongoing IV diuresis. ? ?Consultants:  ?Cardiology ? ?Procedures:  ?See below ? ?Antimicrobials:  ?None ? ? ?Objective: ?Vitals:  ? 04/05/21 0547 04/05/21 0708 04/05/21 0805 04/05/21 1233  ?BP: (!) 105/42  129/61 (!) 134/57  ?Pulse: 66  71 73  ?Resp: 18  20 20   ?Temp: 98 ?F (36.7 ?C)   98.4 ?F (36.9 ?C)  ?TempSrc: Oral   Oral  ?SpO2: 92% 92% 99% 94%  ?Weight:  (!) 207.2 kg    ?Height:      ? ? ?Intake/Output Summary (Last 24 hours) at 04/05/2021 1244 ?Last data filed at 04/05/2021 1100 ?Gross per 24 hour  ?Intake 1110.28 ml  ?Output 2750 ml  ?Net -1639.72 ml  ? ?Filed Weights  ? 04/03/21 [id number] 04/04/21 0610 04/05/21 0708  ?Weight: (!) 214.5 kg (!) 209.8 kg (!) 207.2 kg  ? ? ? ? ?Physical Exam: ?  ?General:  AAO x 3,  cooperative, no distress; morbidly obese male  ?HEENT:  Normocephalic, PERRL, otherwise with in Normal limits   ?Neuro:  CNII-XII intact. , normal motor and sensation, reflexes intact   ?Lungs:   Clear to auscultation BL, Respirations unlabored,  ?No wheezes / crackles  ?Cardio:    S1/S2, RRR, No murmure, No Rubs or Gallops   ?Abdomen:  Soft, non-tender, bowel sounds active all four quadrants, ?no guarding or peritoneal signs.  ?Muscular  ?skeletal:  Limited exam -global generalized weaknesses ?- in bed, able to move all 4 extremities,   ?2+ pulses,  symmetric, ++4  pitting edema  ?Skin:  Dry, warm to touch, negative for any Rashes,  ?Wounds: Please see nursing documentation ?   ? ? ?  ? ? ?Data Reviewed: I have personally reviewed following labs and imaging studies ? ?CBC: ?Recent Labs  ?Lab 03/31/21 ?1120 04/01/21 ?[id number]  ?WBC 8.0 9.1  ?NEUTROABS 5.2  --    ?HGB 13.5 13.2  ?HCT 47.0 44.9  ?MCV 102.0* 100.7*  ?PLT 303 316  ? ?Basic Metabolic Panel: ?Recent Labs  ?Lab 04/01/21 ?[id number] 04/02/21 ?0450 04/03/21 ?0559 04/04/21 ?[id number] 04/05/21 ?[id number]  ?NA 139 138 138 136 135  ?K 5.1 4.6 4.6 3.7 3.7  ?CL 94* 89* 88* 84* 82*  ?CO2 38* 39* 38* 38* 43*  ?GLUCOSE 196* 263* 256* 257* 321*  ?BUN 17 17 15 16  21*  ?CREATININE 1.01 1.02 0.93 0.95 0.99  ?CALCIUM 9.1 9.3 9.5 9.3 9.3  ?MG 2.0 1.7  --   --   --   ? ?GFR: ?Estimated Creatinine Clearance: 144.9 mL/min (by C-G formula based on SCr of 0.99 mg/dL). ?Liver Function Tests: ?Recent Labs  ?Lab 03/31/21 ?1120  ?AST 23  ?ALT 20  ?ALKPHOS 61  ?BILITOT 0.3  ?PROT 7.2  ?ALBUMIN 3.5  ? ? ?BNP (last 3 results) ?Recent Labs  ?  08/12/20 ?1036  ?PROBNP 122  ? ?HbA1C: ?No results for input(s): HGBA1C in the last 72 hours. ?CBG: ?Recent Labs  ?Lab 04/04/21 ?1626 04/04/21 ?2150 04/05/21 ?0020 04/05/21 ?0719 04/05/21 ?1103  ?GLUCAP 285* 405* 346* 293* 372*  ? ? ? ? ?Radiology Studies: ?No results found. ? ? ? ? ? ?Scheduled Meds: ? albuterol  2.5 mg Nebulization TID  ? allopurinol  300 mg Oral Daily  ? enoxaparin (LOVENOX) injection  110 mg Subcutaneous Q24H  ? insulin aspart  0-20 Units Subcutaneous TID WC  ? insulin aspart  0-5 Units Subcutaneous QHS  ? insulin detemir  40 Units Subcutaneous BID  ? multivitamin with minerals  1 tablet Oral Daily  ? pantoprazole  40 mg Oral Daily  ? pravastatin  40 mg Oral Daily  ? sodium chloride flush  3 mL Intravenous Q12H  ? verapamil  120 mg Oral Daily  ? ?Continuous Infusions: ? sodium chloride    ? furosemide (LASIX) 200 mg in dextrose 5% 100 mL (2mg /mL) infusion 8 mg/hr (04/05/21 0848)  ? ? ? LOS: 5 days  ? ? ?Time spent: 35 minutes ? ? ? ?Deatra James, MD ?[facility] ? ?If 7PM-7AM, please contact night-coverage ?[email or web address] ?04/05/2021, 12:44 PM  ? ?

## 2021-04-05 NOTE — Progress Notes (Incomplete)
*  PRELIMINARY RESULTS* ?Echocardiogram ?Limited 2-D Echocardiogram  has been performed with [facility]. ? ?Samuel Germany ?04/05/2021, 4:42 PM ?

## 2021-04-05 NOTE — Progress Notes (Signed)
Patient has own CPAP unit from home.  Patient self manages machine. ?

## 2021-04-05 NOTE — Addendum Note (Signed)
Addended by: Armstead Peaks on: 04/05/2021 08:15 AM ? ? Modules accepted: Orders ? ?

## 2021-04-06 ENCOUNTER — Encounter: Payer: Medicaid Other | Admitting: Pulmonary Disease

## 2021-04-06 DIAGNOSIS — J9611 Chronic respiratory failure with hypoxia: Secondary | ICD-10-CM | POA: Diagnosis not present

## 2021-04-06 DIAGNOSIS — E1169 Type 2 diabetes mellitus with other specified complication: Secondary | ICD-10-CM | POA: Diagnosis not present

## 2021-04-06 DIAGNOSIS — I5033 Acute on chronic diastolic (congestive) heart failure: Secondary | ICD-10-CM | POA: Diagnosis not present

## 2021-04-06 DIAGNOSIS — I509 Heart failure, unspecified: Secondary | ICD-10-CM | POA: Diagnosis not present

## 2021-04-06 DIAGNOSIS — E119 Type 2 diabetes mellitus without complications: Secondary | ICD-10-CM | POA: Diagnosis not present

## 2021-04-06 LAB — BASIC METABOLIC PANEL
Anion gap: 14 (ref 5–15)
BUN: 24 mg/dL — ABNORMAL HIGH (ref 6–20)
CO2: 40 mmol/L — ABNORMAL HIGH (ref 22–32)
Calcium: 9.5 mg/dL (ref 8.9–10.3)
Chloride: 82 mmol/L — ABNORMAL LOW (ref 98–111)
Creatinine, Ser: 1.01 mg/dL (ref 0.61–1.24)
GFR, Estimated: >60 mL/min (ref >60)
Glucose, Bld: 334 mg/dL — ABNORMAL HIGH (ref 70–99)
Potassium: 3.4 mmol/L — ABNORMAL LOW (ref 3.5–5.1)
Sodium: 136 mmol/L (ref 135–145)

## 2021-04-06 LAB — GLUCOSE, CAPILLARY
Glucose-Capillary: 328 mg/dL — ABNORMAL HIGH (ref 70–99)
Glucose-Capillary: 381 mg/dL — ABNORMAL HIGH (ref 70–99)
Glucose-Capillary: 440 mg/dL — ABNORMAL HIGH (ref 70–99)
Glucose-Capillary: 361 mg/dL — ABNORMAL HIGH (ref 70–99)

## 2021-04-06 LAB — GLUCOSE, RANDOM: Glucose, Bld: 443 mg/dL — ABNORMAL HIGH (ref 70–99)

## 2021-04-06 LAB — MAGNESIUM: Magnesium: 1.7 mg/dL (ref 1.7–2.4)

## 2021-04-06 LAB — ECHOCARDIOGRAM COMPLETE
Area-P 1/2: 2.56 cm2
Height: 69.5 in
Weight: 7310.4 oz

## 2021-04-06 LAB — ALBUMIN: Albumin: 3.8 g/dL (ref 3.5–5.0)

## 2021-04-06 MED ORDER — METOLAZONE 5 MG PO TABS
2.5000 mg | ORAL_TABLET | Freq: Once | ORAL | Status: DC
  Filled 2021-04-06: qty 1

## 2021-04-06 MED ORDER — LIDOCAINE HCL (PF) 1 % IJ SOLN
INTRAMUSCULAR | Status: AC
  Filled 2021-04-06: qty 2

## 2021-04-06 MED ORDER — INSULIN ASPART 100 UNIT/ML IJ SOLN
20.0000 [IU] | Freq: Once | INTRAMUSCULAR | Status: AC
  Administered 2021-04-06: 20 [IU] via SUBCUTANEOUS

## 2021-04-06 MED ORDER — LIDOCAINE HCL 3.5 % OP GEL
OPHTHALMIC | Status: AC
  Filled 2021-04-06: qty 1

## 2021-04-06 MED ORDER — NEOMYCIN-POLYMYXIN-DEXAMETH 3.5-10000-0.1 OP SUSP
OPHTHALMIC | Status: AC
  Filled 2021-04-06: qty 5

## 2021-04-06 MED ORDER — POTASSIUM CHLORIDE CRYS ER 20 MEQ PO TBCR
40.0000 meq | EXTENDED_RELEASE_TABLET | Freq: Once | ORAL | Status: AC
  Administered 2021-04-06: 40 meq via ORAL
  Filled 2021-04-06: qty 2

## 2021-04-06 MED ORDER — INSULIN DETEMIR 100 UNIT/ML ~~LOC~~ SOLN
45.0000 [IU] | Freq: Two times a day (BID) | SUBCUTANEOUS | Status: DC
  Administered 2021-04-06: 45 [IU] via SUBCUTANEOUS
  Filled 2021-04-06 (×2): qty 0.45

## 2021-04-06 MED ORDER — TETRACAINE HCL 0.5 % OP SOLN
OPHTHALMIC | Status: AC
  Filled 2021-04-06: qty 4

## 2021-04-06 MED ORDER — PHENYLEPHRINE HCL 2.5 % OP SOLN
OPHTHALMIC | Status: AC
  Filled 2021-04-06: qty 15

## 2021-04-06 MED ORDER — MAGNESIUM SULFATE 2 GM/50ML IV SOLN
2.0000 g | Freq: Once | INTRAVENOUS | Status: AC
  Administered 2021-04-06: 2 g via INTRAVENOUS
  Filled 2021-04-06: qty 50

## 2021-04-06 MED ORDER — TROPICAMIDE 1 % OP SOLN
OPHTHALMIC | Status: AC
  Filled 2021-04-06: qty 3

## 2021-04-06 NOTE — Progress Notes (Signed)
Pt has his home CPAP machine setup at bedside and ready for use, pt prefer to put on himself when ready. Instruct patient to call if my assistance is needed ?

## 2021-04-06 NOTE — Progress Notes (Signed)
Inpatient Diabetes Program Recommendations ? ?AACE/ADA: New Consensus Statement on Inpatient Glycemic Control ? ?Target Ranges:  Prepandial:   less than 140 mg/dL ?     Peak postprandial:   less than 180 mg/dL (1-2 hours) ?     Critically ill patients:  140 - 180 mg/dL  ? ? Latest Reference Range & Units 04/06/21 05:49  ?Glucose 70 - 99 mg/dL 334 (H)  ? ? Latest Reference Range & Units 04/05/21 07:19 04/05/21 11:03 04/05/21 16:34 04/05/21 21:05  ?Glucose-Capillary 70 - 99 mg/dL 293 (H) 372 (H) 344 (H) 366 (H)  ? ?Review of Glycemic Control ? ?Diabetes history: DM2 ?Outpatient Diabetes medications: Humulin R U500 130 units with breakfast, 100 units with lunch and supper, Metformin 1000 mg BID, Glipizide XL 5 mg daily, Bydureon 2 mg Qweek  ?Current orders for Inpatient glycemic control: Levemir 40 units BID, Novolog 0-20 units TID with meals, Novolog 0-5 units QHS ?  ?Inpatient Diabetes Program Recommendations:   ?  ?Insulin: Please consider increasing Levemir to 50 units BID and ordering Novolog 7 units TID with meals for meal coverage if patient eats at least 50% of meals. ?  ?Thanks, ?Barnie Alderman, RN, MSN, CDE ?Diabetes Coordinator ?Inpatient Diabetes Program ?[phone] (Team Pager from 8am to 5pm) ? ?

## 2021-04-06 NOTE — Progress Notes (Signed)
?PROGRESS NOTE ? ? ? ?Aaron BihariGeorge Potter  ZOX:[id number] DOB: 03/18/1960 DOA: 03/31/2021 ?PCP: Suzan Slickucker, Alethea Y, MD ? ? ?Subjective : ? ?The patient was seen and examined this morning, despite diuresing, losing weight and fluid about 2.7 L since yesterday still seems to be volume overloaded ?His O2 demand has increased to 6.5 L by nasal cannula satting 96% ? ?Otherwise sitting in side of bed, awake alert, following commands ? ?Brief Narrative:  ?Per HPI: ?Aaron BihariGeorge Wisor is a 61 y.o. male with medical history significant of morbid obesity, hypertension, dyslipidemia, insulin-dependent type 2 diabetes, OSA on BiPAP, and GERD who presented to the ED with worsening shortness of breath and lower extremity edema after gaining what appears to be approximately 60 pounds in the last 1 month.  He has had a recent appointment with his cardiologist Dr. Odis Hollingsheadolia on 3/21 at which time he was noted to have a 17 pound weight gain since his last office visit.  He has remained compliant on his home diuretics and was changed over to torsemide from his usual Lasix on 3/21.  He is usually fairly short of breath with any amount of activity and appears to have worsening symptoms of sleep apnea.  He does not follow low-sodium diet.  Sister is at bedside.  He denies any chest pain, fevers, chills, or cough with sputum production. ? ?3/30: Patient was admitted with acute on chronic HFpEF and is massively volume overloaded.  He was started on IV Lasix with approximately 2 L of negative fluid balance overnight.  He has been seen by cardiology today and started on Lasix drip to assist with diuresis.  Repeat echocardiogram pending.  ? ? ?Assessment & Plan: ?  ?Principal Problem: ?  Acute CHF (HCC) ?Active Problems: ?  Morbid obesity (HCC) ?  Obstructive sleep apnea ?  Essential (primary) hypertension ?  Gastro-esophageal reflux disease with esophagitis ?  Diabetes mellitus (HCC) ?  Chronic respiratory failure with hypoxia (HCC) ? ?Assessment and  Plan: ? ? ?Acute on chronic HFpEF (HCC) ?Anasarca-severely volume overload ?Apparently patient was weighing 491 today 454 pounds ?(Unclear what the baseline weight is.Marland Kitchen.) ?Still in respiratory stress, O2 demand has increased from 4 to 6.5 L, satting 96% ? ?-Continue on Lasix drip, dose increased by Dr. Wyline MoodBranch today ? ?-Still on Lasix drip, 1 dose of acetazolamide was given on 04/03/2021 ?Second dose of acetazolamide scheduled for today 04/05/2021 ? ?Intake/Output Summary (Last 24 hours) at 04/06/2021 0806 ?Last data filed at 04/06/2021 0520 ?   ?Gross per 24 hour  ?Intake 1560 ml  ?Output 4300 ml  ?Net -2740 ml  ?  ?  ?  04/06/2021  ?  5:00 AM 04/05/2021  ?  7:08 AM 04/04/2021  ?  6:10 AM  ?Last 3 Weights  ?Weight (lbs) 454 lb 12.8 oz 456 lb 14.4 oz 462 lb 8 oz  ?Weight (kg) 206.296 kg 207.248 kg 209.789 kg  ?   ? ?2D echocardiogram: Limited study possible moderate AAS, ?Suboptimal, even with contrast which was repeated on 04/05/2021 ? ? ?-On Lasix drip 7 >> increasing at 2 8 mg/h,  ? ?Strict I's and O's ?-It appears the patient has lost close to 12 L since admission ? ?Holding Olmesartan for now ?  ? ?Chronic respiratory failure with hypoxia (HCC) ?-O2 demand has increased from 4 to 6.5 L via nasal cannula, satting 96% ? ?Baseline- 4 L nasal cannula at home ?Interested in having a concentrator at discharge ?Continue as needed DuoNeb bronchodilator treatment ? ?Diabetes  mellitus (HCC) ?Hyperglycemic ?-Holding home medication metformin and glipizide ?-Elevated blood sugars..  ?-Increasing basal insulin -Levemir from 30 to 40 units twice daily ?Titrating insulin up ?Along with SSI coverage ? ? ?  ?Gastro-esophageal reflux disease with esophagitis ?PPI ?  ?Essential (primary) hypertension ?-Blood pressure soft, monitoring ?Continue home verapamil,  ?-hold olmesartan while on aggressive IV diuresis ?-Verapamil dose decreased due to sinus pauses ?-Currently stable, monitoring ? ? ?Obstructive sleep apnea ?Plan to keep on BiPAP  overnight ?-Compliant ?  ?Morbid obesity (HCC) ?Lifestyle changes outpatient ?-Discussed with the patient and his sisters at bedside regarding follow-up with PCP with aggressive weight loss program, dietary modification ?We will consult nutrition ?  ?  ?  04/06/2021  ?  5:00 AM 04/05/2021  ?  7:08 AM 04/04/2021  ?  6:10 AM  ?Last 3 Weights  ?Weight (lbs) 454 lb 12.8 oz 456 lb 14.4 oz 462 lb 8 oz  ?Weight (kg) 206.296 kg 207.248 kg 209.789 kg  ?   ? ? ?DVT prophylaxis: Lovenox ?Code Status: Full ?Family Communication: Sisters at bedside 3/31 ?Disposition Plan:  ?Status is: Inpatient ?Remains inpatient appropriate because: Need for ongoing IV diuresis. ? ?Consultants:  ?Cardiology ? ?Procedures:  ?See below ? ?Antimicrobials:  ?None ? ? ?Objective: ?Vitals:  ? 04/06/21 0500 04/06/21 0521 04/06/21 0757 04/06/21 1233  ?BP:  (!) 150/46  (!) 107/53  ?Pulse:  78  75  ?Resp:  18  16  ?Temp:  97.8 ?F (36.6 ?C)  98.2 ?F (36.8 ?C)  ?TempSrc:  Oral  Oral  ?SpO2:  96% 96% 94%  ?Weight: (!) 206.3 kg     ?Height:      ? ? ? ?Filed Weights  ? 04/04/21 0610 04/05/21 0708 04/06/21 0500  ?Weight: (!) 209.8 kg (!) 207.2 kg (!) 206.3 kg  ? ? ? ? ? ?Physical Exam: ?  ?General:  AAO x 3,  cooperative, no distress; morbidly obese male  ?HEENT:  Normocephalic, PERRL, otherwise with in Normal limits   ?Neuro:  CNII-XII intact. , normal motor and sensation, reflexes intact   ?Lungs:   Clear to auscultation BL, Respirations unlabored,  ?No wheezes / crackles  ?Cardio:    S1/S2, RRR, No murmure, No Rubs or Gallops   ?Abdomen:  Soft, non-tender, bowel sounds active all four quadrants, ?no guarding or peritoneal signs.  ?Muscular  ?skeletal:  Limited exam -global generalized weaknesses ?- in bed, able to move all 4 extremities,   ?2+ pulses,  symmetric, ++4  pitting edema  ?Skin:  Dry, warm to touch, negative for any Rashes,  ?Wounds: Please see nursing documentation ?   ? ? ?  ? ?  ? ? ?Data Reviewed: I have personally reviewed following labs and  imaging studies ? ?CBC: ?Recent Labs  ?Lab 03/31/21 ?1120 04/01/21 ?5597  ?WBC 8.0 9.1  ?NEUTROABS 5.2  --   ?HGB 13.5 13.2  ?HCT 47.0 44.9  ?MCV 102.0* 100.7*  ?PLT 303 316  ? ?Basic Metabolic Panel: ?Recent Labs  ?Lab 04/01/21 ?4163 04/02/21 ?0450 04/03/21 ?0559 04/04/21 ?8453 04/05/21 ?6468 04/06/21 ?0321  ?NA 139 138 138 136 135 136  ?K 5.1 4.6 4.6 3.7 3.7 3.4*  ?CL 94* 89* 88* 84* 82* 82*  ?CO2 38* 39* 38* 38* 43* 40*  ?GLUCOSE 196* 263* 256* 257* 321* 334*  ?BUN 17 17 15 16  21* 24*  ?CREATININE 1.01 1.02 0.93 0.95 0.99 1.01  ?CALCIUM 9.1 9.3 9.5 9.3 9.3 9.5  ?MG 2.0 1.7  --   --   --  1.7  ? ?GFR: ?Estimated Creatinine Clearance: 141.7 mL/min (by C-G formula based on SCr of 1.01 mg/dL). ?Liver Function Tests: ?Recent Labs  ?Lab 03/31/21 ?1120 04/06/21 ?5277  ?AST 23  --   ?ALT 20  --   ?ALKPHOS 61  --   ?BILITOT 0.3  --   ?PROT 7.2  --   ?ALBUMIN 3.5 3.8  ? ? ?BNP (last 3 results) ?Recent Labs  ?  08/12/20 ?1036  ?PROBNP 122  ? ?HbA1C: ?No results for input(s): HGBA1C in the last 72 hours. ?CBG: ?Recent Labs  ?Lab 04/05/21 ?1103 04/05/21 ?1634 04/05/21 ?2105 04/06/21 ?8242 04/06/21 ?1104  ?GLUCAP 372* 344* 366* 328* 381*  ? ? ? ? ?Radiology Studies: ?ECHOCARDIOGRAM COMPLETE ? ?Result Date: 04/06/2021 ?   ECHOCARDIOGRAM REPORT   Patient Name:   JAYDYN BOZZO Date of Exam: 04/05/2021 Medical Rec #:  [id number]      Height:       69.5 in Accession #:    [id number]     Weight:       456.9 lb Date of Birth:  Jun 10, 1960      BSA:          2.950 m? Patient Age:    60 years       BP:           134/57 mmHg Patient Gender: M              HR:           70 bpm. [facility] Procedure: 2D Echo, Cardiac Doppler and Color Doppler Indications:    Dyspnea R06.00  History:        Patient has prior history of Echocardiogram examinations. CHF,                 COPD; Risk Factors:Hypertension, Dyslipidemia and Diabetes.                 Morbid obesity, Obstructive sleep apnea.  Sonographer:    Celesta Gentile RCS Referring  Phys: [id number] Dorothe Pea BRANCH  Sonographer Comments: Image acquisition challenging due to patient body habitus. Extremely difficult echo due to extremely poor acustic windows. IMPRESSIONS  1. Very limited visualization ev

## 2021-04-06 NOTE — Progress Notes (Signed)
? ?Progress Note ? ?Patient Name: Aaron Potter ?Date of Encounter: 04/06/2021 ? ?[facility] Cardiologist: Tessa Lerner, DO  ? ?Subjective  ? ?SOB is improving.  ? ?Inpatient Medications  ?  ?Scheduled Meds: ? albuterol  2.5 mg Nebulization TID  ? allopurinol  300 mg Oral Daily  ? enoxaparin (LOVENOX) injection  110 mg Subcutaneous Q24H  ? insulin aspart  0-20 Units Subcutaneous TID WC  ? insulin aspart  0-5 Units Subcutaneous QHS  ? insulin detemir  40 Units Subcutaneous BID  ? metolazone  2.5 mg Oral Once  ? multivitamin with minerals  1 tablet Oral Daily  ? pantoprazole  40 mg Oral Daily  ? potassium chloride  40 mEq Oral Once  ? pravastatin  40 mg Oral Daily  ? sodium chloride flush  3 mL Intravenous Q12H  ? verapamil  120 mg Oral Daily  ? ?Continuous Infusions: ? sodium chloride    ? furosemide (LASIX) 200 mg in dextrose 5% 100 mL (2mg /mL) infusion 8 mg/hr (04/05/21 1511)  ? ?PRN Meds: ?sodium chloride, acetaminophen **OR** acetaminophen, albuterol, ALPRAZolam, diclofenac Sodium, ondansetron **OR** ondansetron (ZOFRAN) IV, oxyCODONE-acetaminophen, sodium chloride flush  ? ?Vital Signs  ?  ?Vitals:  ? 04/05/21 2103 04/06/21 0500 04/06/21 0521 04/06/21 0757  ?BP: (!) 113/52  (!) 150/46   ?Pulse: 73  78   ?Resp: 18  18   ?Temp: 98 ?F (36.7 ?C)  97.8 ?F (36.6 ?C)   ?TempSrc: Oral  Oral   ?SpO2: 94%  96% 96%  ?Weight:  (!) 206.3 kg    ?Height:      ? ? ?Intake/Output Summary (Last 24 hours) at 04/06/2021 0806 ?Last data filed at 04/06/2021 0520 ?Gross per 24 hour  ?Intake 1560 ml  ?Output 4300 ml  ?Net -2740 ml  ? ? ?  04/06/2021  ?  5:00 AM 04/05/2021  ?  7:08 AM 04/04/2021  ?  6:10 AM  ?Last 3 Weights  ?Weight (lbs) 454 lb 12.8 oz 456 lb 14.4 oz 462 lb 8 oz  ?Weight (kg) 206.296 kg 207.248 kg 209.789 kg  ?   ? ?Telemetry  ?  ?SR - Personally Reviewed ? ?ECG  ?  ?N/a - Personally Reviewed ? ?Physical Exam  ? ?GEN: No acute distress.   ?Neck: No JVD ?Cardiac: RRR, no murmurs, rubs, or gallops.  ?Respiratory: Clear to  auscultation bilaterally. ?GI: Soft, nontender, non-distended  ?MSK: 2+ bilateral LE edema ?Psych: Normal affect  ? ?Labs  ?  ?High Sensitivity Troponin:  No results for input(s): TROPONINIHS in the last 720 hours.   ?Chemistry ?Recent Labs  ?Lab 03/31/21 ?1120 04/01/21 ?04/03/21 04/02/21 ?0450 04/03/21 ?0559 04/04/21 ?06/04/21 04/05/21 ?06/05/21 04/06/21 ?06/06/21  ?NA 141 139 138   < > 136 135 136  ?K 4.7 5.1 4.6   < > 3.7 3.7 3.4*  ?CL 100 94* 89*   < > 84* 82* 82*  ?CO2 38* 38* 39*   < > 38* 43* 40*  ?GLUCOSE 58* 196* 263*   < > 257* 321* 334*  ?BUN 17 17 17    < > 16 21* 24*  ?CREATININE 1.01 1.01 1.02   < > 0.95 0.99 1.01  ?CALCIUM 9.2 9.1 9.3   < > 9.3 9.3 9.5  ?MG  --  2.0 1.7  --   --   --  1.7  ?PROT 7.2  --   --   --   --   --   --   ?ALBUMIN 3.5  --   --   --   --   --   --   ?  AST 23  --   --   --   --   --   --   ?ALT 20  --   --   --   --   --   --   ?ALKPHOS 61  --   --   --   --   --   --   ?BILITOT 0.3  --   --   --   --   --   --   ?GFRNONAA >60 >60 >60   < > >60 >60 >60  ?ANIONGAP 3* 7 10   < > 14 10 14   ? < > = values in this interval not displayed.  ?  ?Lipids No results for input(s): CHOL, TRIG, HDL, LABVLDL, LDLCALC, CHOLHDL in the last 168 hours.  ?Hematology ?Recent Labs  ?Lab 03/31/21 ?1120 04/01/21 ?04/03/21  ?WBC 8.0 9.1  ?RBC 4.61 4.46  ?HGB 13.5 13.2  ?HCT 47.0 44.9  ?MCV 102.0* 100.7*  ?MCH 29.3 29.6  ?MCHC 28.7* 29.4*  ?RDW 16.6* 16.6*  ?PLT 303 316  ? ?Thyroid No results for input(s): TSH, FREET4 in the last 168 hours.  ?BNP ?Recent Labs  ?Lab 03/31/21 ?1120  ?BNP 107.0*  ?  ?DDimer No results for input(s): DDIMER in the last 168 hours.  ? ?Radiology  ?  ?No results found. ? ?Cardiac Studies  ? ? ? ?Patient Profile  ?   ?Takota Cahalan is a 61 y.o. male with a hx of HFpEF, HTN, HLD, IDDM, COPD, OSA and morbid obesity who is being seen 04/01/2021 for the evaluation of CHF at the request of Dr. 04/03/2021. ?  ? ?Assessment & Plan  ?  ?1.Acute on chronic HFpEF ?- 04/2020 [facility] Echo: limited visualization, probably  normal LVEF. Possibly moderate AS ?- very limited visualation with echos this admit including repeat attempt yesterday with contrast. Best estimate is that LVEF is probably normal 55-60% ?  ?- on lasix drip at 8 mg/hr and received 2.5mg  metolazone yesterday. Negative 2.7 L yesterday, negat 14.7 since admission. . If weights accurate down from admit 491 to 454 lbs. Baseline unclear as was 458 in Aug but 439 listed in September. Stable Cr, slight uptrend in BUN. Would favor titrating lasix drip as opposed to additional oral doses of metolazone, less likely for electrolyte abnormalities and AKI. Increase lasix drip to 9mg /hr today, hold on additional metolazone.  ? ? ?  ?  ? ? ?For questions or updates, please contact [facility] ?Please consult [email or web address] for contact info under  ? ?  ?   ?Signed, ?October, MD  ?04/06/2021, 8:06 AM   ? ?

## 2021-04-07 DIAGNOSIS — I509 Heart failure, unspecified: Secondary | ICD-10-CM | POA: Diagnosis not present

## 2021-04-07 LAB — COMPREHENSIVE METABOLIC PANEL
ALT: 22 U/L (ref 0–44)
AST: 30 U/L (ref 15–41)
Albumin: 3.6 g/dL (ref 3.5–5.0)
Alkaline Phosphatase: 57 U/L (ref 38–126)
Anion gap: 13 (ref 5–15)
BUN: 29 mg/dL — ABNORMAL HIGH (ref 6–20)
CO2: 39 mmol/L — ABNORMAL HIGH (ref 22–32)
Calcium: 9.1 mg/dL (ref 8.9–10.3)
Chloride: 82 mmol/L — ABNORMAL LOW (ref 98–111)
Creatinine, Ser: 1.07 mg/dL (ref 0.61–1.24)
GFR, Estimated: >60 mL/min (ref >60)
Glucose, Bld: 326 mg/dL — ABNORMAL HIGH (ref 70–99)
Potassium: 3.2 mmol/L — ABNORMAL LOW (ref 3.5–5.1)
Sodium: 134 mmol/L — ABNORMAL LOW (ref 135–145)
Total Bilirubin: 1.3 mg/dL — ABNORMAL HIGH (ref 0.3–1.2)
Total Protein: 7.1 g/dL (ref 6.5–8.1)

## 2021-04-07 LAB — GLUCOSE, CAPILLARY
Glucose-Capillary: 321 mg/dL — ABNORMAL HIGH (ref 70–99)
Glucose-Capillary: 425 mg/dL — ABNORMAL HIGH (ref 70–99)
Glucose-Capillary: 344 mg/dL — ABNORMAL HIGH (ref 70–99)
Glucose-Capillary: 318 mg/dL — ABNORMAL HIGH (ref 70–99)
Glucose-Capillary: 284 mg/dL — ABNORMAL HIGH (ref 70–99)

## 2021-04-07 LAB — GLUCOSE, RANDOM: Glucose, Bld: 432 mg/dL — ABNORMAL HIGH (ref 70–99)

## 2021-04-07 MED ORDER — INSULIN DETEMIR 100 UNIT/ML ~~LOC~~ SOLN
50.0000 [IU] | Freq: Two times a day (BID) | SUBCUTANEOUS | Status: DC
  Administered 2021-04-07 (×2): 50 [IU] via SUBCUTANEOUS
  Filled 2021-04-07 (×5): qty 0.5

## 2021-04-07 MED ORDER — POTASSIUM CHLORIDE CRYS ER 20 MEQ PO TBCR
40.0000 meq | EXTENDED_RELEASE_TABLET | Freq: Two times a day (BID) | ORAL | Status: DC
  Administered 2021-04-07 – 2021-04-09 (×5): 40 meq via ORAL
  Filled 2021-04-07 (×5): qty 2

## 2021-04-07 MED ORDER — INSULIN ASPART 100 UNIT/ML IJ SOLN
7.0000 [IU] | Freq: Three times a day (TID) | INTRAMUSCULAR | Status: DC
  Administered 2021-04-07 – 2021-04-08 (×4): 7 [IU] via SUBCUTANEOUS

## 2021-04-07 NOTE — TOC Progression Note (Signed)
Transition of Care (TOC) - Progression Note  ? ? ?Patient Details  ?Name: Aaron Potter ?MRN: [id number] ?Date of Birth: 1960/10/24 ? ?Transition of Care (TOC) CM/SW Contact  ?Karn Cassis, LCSW ?Phone Number: ?04/07/2021, 11:20 AM ? ?Clinical Narrative:  TOC received consult for assistance getting pt a scale for home use. Per unit assistant director, hospital does not have any scales to give to patients. LCSW discussed with pt who states he would need a scale to go up to 450-500 pounds. Discussed looking at [facility] or [place] and pt states he will have a family member assist in ordering so he can have it at home when he discharges from hospital.  ? ? ? ?Expected Discharge Plan: OP Rehab ?Barriers to Discharge: Continued Medical Work up ? ?Expected Discharge Plan and Services ?Expected Discharge Plan: OP Rehab ?In-house Referral: Clinical Social Work ?  ?  ?Living arrangements for the past 2 months: Single Family Home ?                ?  ?  ?  ?  ?  ?  ?  ?  ?  ?  ? ? ?Social Determinants of Health (SDOH) Interventions ?  ? ?Readmission Risk Interventions ?   ? View : No data to display.  ?  ?  ?  ? ? ?

## 2021-04-07 NOTE — Plan of Care (Signed)

## 2021-04-07 NOTE — Progress Notes (Signed)
?PROGRESS NOTE ? ? ? ?Aaron Potter  JOA:[id number] DOB: 07/04/1960 DOA: 03/31/2021 ?PCP: Suzan Slick, MD ? ? ?Brief Narrative:  ?Per HPI: ?Aaron Potter is a 61 y.o. male with medical history significant of morbid obesity, hypertension, dyslipidemia, insulin-dependent type 2 diabetes, OSA on BiPAP, and GERD who presented to the ED with worsening shortness of breath and lower extremity edema after gaining what appears to be approximately 60 pounds in the last 1 month.  He has had a recent appointment with his cardiologist Dr. Odis Hollingshead on 3/21 at which time he was noted to have a 17 pound weight gain since his last office visit.  He has remained compliant on his home diuretics and was changed over to torsemide from his usual Lasix on 3/21.  He is usually fairly short of breath with any amount of activity and appears to have worsening symptoms of sleep apnea.  He does not follow low-sodium diet.  Sister is at bedside.  He denies any chest pain, fevers, chills, or cough with sputum production. ? ?Patient continues to have significant diuresis with ongoing Lasix drip.  ? ? ?Assessment & Plan: ?  ?Principal Problem: ?  Acute CHF (HCC) ?Active Problems: ?  Morbid obesity (HCC) ?  Obstructive sleep apnea ?  Essential (primary) hypertension ?  Gastro-esophageal reflux disease with esophagitis ?  Diabetes mellitus (HCC) ?  Chronic respiratory failure with hypoxia (HCC) ? ?Assessment and Plan: ? ? ?Acute on chronic HFpEF (HCC) ?Anasarca-severely volume overload ?Apparently patient was weighing 491 today 453 pounds ?(Unclear what the baseline weight is.Marland Kitchen) ?Still in respiratory stress, O2 demand has increased from 4 to 6.5 L, satting 96% ?  ?-Continue on Lasix drip, dose increased by Dr. Wyline Mood today ?  ?-Still on Lasix drip, 1 dose of acetazolamide was given on 04/03/2021 ?Second dose of acetazolamide scheduled for today 04/05/2021 ?   ?  ?2D echocardiogram: Limited study possible moderate AAS, ?Suboptimal, even with contrast  which was repeated on 04/05/2021 ?  ?  ?-On Lasix drip which is being increased to 10 mg/hour ?  ?Strict I's and O's ?-It appears the patient has lost close to 12 L since admission ?  ?Holding Olmesartan for now ?  ?  ?Chronic respiratory failure with hypoxia (HCC) ?-O2 demand has increased from 4 to 6.5 L via nasal cannula, satting 96% ?  ?Baseline- 4 L nasal cannula at home ?Interested in having a concentrator at discharge ?Continue as needed DuoNeb bronchodilator treatment ?  ?Diabetes mellitus (HCC) ?Hyperglycemic ?-Holding home medication metformin and glipizide ?-Elevated blood sugars..  ?-Increasing basal insulin -Levemir to 45 units twice daily ?Added NovoLog 7 units 3 times daily with meals ?Titrating insulin up ?Along with SSI coverage ?  ?  ?  ?Gastro-esophageal reflux disease with esophagitis ?PPI ?  ?Essential (primary) hypertension ?-Blood pressure soft, monitoring ?Continue home verapamil,  ?-hold olmesartan while on aggressive IV diuresis ?-Verapamil dose decreased due to sinus pauses ?-Currently stable, monitoring ?  ?  ?Obstructive sleep apnea ?Plan to keep on BiPAP overnight ?-Compliant ?  ?Morbid obesity (HCC) ?Lifestyle changes outpatient ?-Discussed with the patient and his sisters at bedside regarding follow-up with PCP with aggressive weight loss program, dietary modification ?We will consult nutrition ? ?Hypokalemia ?Continue to replete and monitor ?  ?  ?DVT prophylaxis: Lovenox ?Code Status: Full ?Family Communication: Family at bedside 4/5 ?Disposition Plan:  ?Status is: Inpatient ?Remains inpatient appropriate because: Need for ongoing IV diuresis. ?  ?Consultants:  ?Cardiology ?  ?Procedures:  ?  See below ?  ?Antimicrobials:  ?None ? ? Subjective: ?Patient seen and evaluated today with improving shortness of breath and edema noted.  He still remains on 6 L nasal cannula. ? ?Objective: ?Vitals:  ? 04/06/21 2249 04/07/21 0434 04/07/21 0500 04/07/21 0739  ?BP: (!) 128/53 (!) 111/52    ?Pulse:  69 66    ?Resp: 19 19    ?Temp: 97.9 ?F (36.6 ?C) 98 ?F (36.7 ?C)    ?TempSrc:  Oral    ?SpO2: 94% 93%  94%  ?Weight:   (!) 205.9 kg   ?Height:      ? ? ?Intake/Output Summary (Last 24 hours) at 04/07/2021 0921 ?Last data filed at 04/07/2021 0500 ?Gross per 24 hour  ?Intake 920 ml  ?Output 2600 ml  ?Net -1680 ml  ? ?Filed Weights  ? 04/05/21 0708 04/06/21 0500 04/07/21 0500  ?Weight: (!) 207.2 kg (!) 206.3 kg (!) 205.9 kg  ? ? ?Examination: ? ?General exam: Appears calm and comfortable, morbidly obese ?Respiratory system: Clear to auscultation. Respiratory effort normal.  6 L nasal cannula ?Cardiovascular system: S1 & S2 heard, RRR.  ?Gastrointestinal system: Abdomen is soft ?Central nervous system: Alert and awake ?Extremities: Persistent lower extremity pitting edema with chronic venous stasis changes ?Skin: No significant lesions noted ?Psychiatry: Flat affect. ? ? ? ?Data Reviewed: I have personally reviewed following labs and imaging studies ? ?CBC: ?Recent Labs  ?Lab 03/31/21 ?1120 04/01/21 ?[id number]  ?WBC 8.0 9.1  ?NEUTROABS 5.2  --   ?HGB 13.5 13.2  ?HCT 47.0 44.9  ?MCV 102.0* 100.7*  ?PLT 303 316  ? ?Basic Metabolic Panel: ?Recent Labs  ?Lab 04/01/21 ?[id number] 04/02/21 ?0450 04/03/21 ?0559 04/04/21 ?[id number] 04/05/21 ?[id number] 04/06/21 ?[id number] 04/06/21 ?1756 04/07/21 ?0530  ?NA 139 138 138 136 135 136  --  134*  ?K 5.1 4.6 4.6 3.7 3.7 3.4*  --  3.2*  ?CL 94* 89* 88* 84* 82* 82*  --  82*  ?CO2 38* 39* 38* 38* 43* 40*  --  39*  ?GLUCOSE 196* 263* 256* 257* 321* 334* 443* 326*  ?BUN 17 17 15 16  21* 24*  --  29*  ?CREATININE 1.01 1.02 0.93 0.95 0.99 1.01  --  1.07  ?CALCIUM 9.1 9.3 9.5 9.3 9.3 9.5  --  9.1  ?MG 2.0 1.7  --   --   --  1.7  --   --   ? ?GFR: ?Estimated Creatinine Clearance: 133.6 mL/min (by C-G formula based on SCr of 1.07 mg/dL). ?Liver Function Tests: ?Recent Labs  ?Lab 03/31/21 ?1120 04/06/21 ?[id number] 04/07/21 ?0530  ?AST 23  --  30  ?ALT 20  --  22  ?ALKPHOS 61  --  57  ?BILITOT 0.3  --  1.3*  ?PROT 7.2  --  7.1   ?ALBUMIN 3.5 3.8 3.6  ? ?No results for input(s): LIPASE, AMYLASE in the last 168 hours. ?No results for input(s): AMMONIA in the last 168 hours. ?Coagulation Profile: ?No results for input(s): INR, PROTIME in the last 168 hours. ?Cardiac Enzymes: ?No results for input(s): CKTOTAL, CKMB, CKMBINDEX, TROPONINI in the last 168 hours. ?BNP (last 3 results) ?Recent Labs  ?  08/12/20 ?1036  ?PROBNP 122  ? ?HbA1C: ?No results for input(s): HGBA1C in the last 72 hours. ?CBG: ?Recent Labs  ?Lab 04/06/21 ?0721 04/06/21 ?1104 04/06/21 ?1605 04/06/21 ?2250 04/07/21 ?0759  ?GLUCAP 328* 381* 440* 361* 321*  ? ?Lipid Profile: ?No results for input(s): CHOL, HDL, LDLCALC, TRIG, CHOLHDL, LDLDIRECT in the  last 72 hours. ?Thyroid Function Tests: ?No results for input(s): TSH, T4TOTAL, FREET4, T3FREE, THYROIDAB in the last 72 hours. ?Anemia Panel: ?No results for input(s): VITAMINB12, FOLATE, FERRITIN, TIBC, IRON, RETICCTPCT in the last 72 hours. ?Sepsis Labs: ?No results for input(s): PROCALCITON, LATICACIDVEN in the last 168 hours. ? ?No results found for this or any previous visit (from the past 240 hour(s)).  ? ? ? ? ? ?Radiology Studies: ?ECHOCARDIOGRAM COMPLETE ? ?Result Date: 04/06/2021 ?   ECHOCARDIOGRAM REPORT   Patient Name:   AMADOR BRADDY Date of Exam: 04/05/2021 Medical Rec #:  [id number]      Height:       69.5 in Accession #:    [id number]     Weight:       456.9 lb Date of Birth:  10-13-1960      BSA:          2.950 m? Patient Age:    60 years       BP:           134/57 mmHg Patient Gender: M              HR:           70 bpm. [facility] Procedure: 2D Echo, Cardiac Doppler and Color Doppler Indications:    Dyspnea R06.00  History:        Patient has prior history of Echocardiogram examinations. CHF,                 COPD; Risk Factors:Hypertension, Dyslipidemia and Diabetes.                 Morbid obesity, Obstructive sleep apnea.  Sonographer:    Celesta Gentile RCS Referring Phys: [id number] Dorothe Pea BRANCH   Sonographer Comments: Image acquisition challenging due to patient body habitus. Extremely difficult echo due to extremely poor acustic windows. IMPRESSIONS  1. Very limited visualization even with contras

## 2021-04-07 NOTE — Progress Notes (Signed)
? ?Progress Note ? ?Patient Name: Aaron Potter ?Date of Encounter: 04/07/2021 ? ?[facility] Cardiologist: Tessa LernerSunit Tolia, DO  ? ?Subjective  ? ?SOB continues to improve.  ? ?Inpatient Medications  ?  ?Scheduled Meds: ? albuterol  2.5 mg Nebulization TID  ? allopurinol  300 mg Oral Daily  ? enoxaparin (LOVENOX) injection  110 mg Subcutaneous Q24H  ? insulin aspart  0-20 Units Subcutaneous TID WC  ? insulin aspart  0-5 Units Subcutaneous QHS  ? insulin aspart  7 Units Subcutaneous TID WC  ? insulin detemir  50 Units Subcutaneous BID  ? multivitamin with minerals  1 tablet Oral Daily  ? pantoprazole  40 mg Oral Daily  ? potassium chloride  40 mEq Oral BID  ? pravastatin  40 mg Oral Daily  ? sodium chloride flush  3 mL Intravenous Q12H  ? verapamil  120 mg Oral Daily  ? ?Continuous Infusions: ? sodium chloride    ? furosemide (LASIX) 200 mg in dextrose 5% 100 mL (2mg /mL) infusion 9 mg/hr (04/06/21 1826)  ? ?PRN Meds: ?sodium chloride, acetaminophen **OR** acetaminophen, albuterol, ALPRAZolam, diclofenac Sodium, ondansetron **OR** ondansetron (ZOFRAN) IV, oxyCODONE-acetaminophen, sodium chloride flush  ? ?Vital Signs  ?  ?Vitals:  ? 04/06/21 2249 04/07/21 0434 04/07/21 0500 04/07/21 0739  ?BP: (!) 128/53 (!) 111/52    ?Pulse: 69 66    ?Resp: 19 19    ?Temp: 97.9 ?F (36.6 ?C) 98 ?F (36.7 ?C)    ?TempSrc:  Oral    ?SpO2: 94% 93%  94%  ?Weight:   (!) 205.9 kg   ?Height:      ? ? ?Intake/Output Summary (Last 24 hours) at 04/07/2021 0840 ?Last data filed at 04/07/2021 0500 ?Gross per 24 hour  ?Intake 920 ml  ?Output 3200 ml  ?Net -2280 ml  ? ? ?  04/07/2021  ?  5:00 AM 04/06/2021  ?  5:00 AM 04/05/2021  ?  7:08 AM  ?Last 3 Weights  ?Weight (lbs) 453 lb 14.8 oz 454 lb 12.8 oz 456 lb 14.4 oz  ?Weight (kg) 205.9 kg 206.296 kg 207.248 kg  ?   ? ?Telemetry  ?  ?SR - Personally Reviewed ? ?ECG  ?  ?N/a - Personally Reviewed ? ?Physical Exam  ? ?GEN: No acute distress.   ?Neck: No JVD ?Cardiac: RRR, no murmurs, rubs, or gallops.   ?Respiratory: Clear to auscultation bilaterally. ?GI: Soft, nontender, non-distended  ?MS: 1+ bilateral LE edema; No deformity. ?Neuro:  Nonfocal  ?Psych: Normal affect  ? ?Labs  ?  ?High Sensitivity Troponin:  No results for input(s): TROPONINIHS in the last 720 hours.   ?Chemistry ?Recent Labs  ?Lab 03/31/21 ?1120 04/01/21 ?[id number] 04/02/21 ?0450 04/03/21 ?0559 04/05/21 ?[id number] 04/06/21 ?[id number] 04/06/21 ?1756 04/07/21 ?0530  ?NA 141 139 138   < > 135 136  --  134*  ?K 4.7 5.1 4.6   < > 3.7 3.4*  --  3.2*  ?CL 100 94* 89*   < > 82* 82*  --  82*  ?CO2 38* 38* 39*   < > 43* 40*  --  39*  ?GLUCOSE 58* 196* 263*   < > 321* 334* 443* 326*  ?BUN 17 17 17    < > 21* 24*  --  29*  ?CREATININE 1.01 1.01 1.02   < > 0.99 1.01  --  1.07  ?CALCIUM 9.2 9.1 9.3   < > 9.3 9.5  --  9.1  ?MG  --  2.0 1.7  --   --  1.7  --   --   ?PROT 7.2  --   --   --   --   --   --  7.1  ?ALBUMIN 3.5  --   --   --   --  3.8  --  3.6  ?AST 23  --   --   --   --   --   --  30  ?ALT 20  --   --   --   --   --   --  22  ?ALKPHOS 61  --   --   --   --   --   --  57  ?BILITOT 0.3  --   --   --   --   --   --  1.3*  ?GFRNONAA >60 >60 >60   < > >60 >60  --  >60  ?ANIONGAP 3* 7 10   < > 10 14  --  13  ? < > = values in this interval not displayed.  ?  ?Lipids No results for input(s): CHOL, TRIG, HDL, LABVLDL, LDLCALC, CHOLHDL in the last 168 hours.  ?Hematology ?Recent Labs  ?Lab 03/31/21 ?1120 04/01/21 ?8115  ?WBC 8.0 9.1  ?RBC 4.61 4.46  ?HGB 13.5 13.2  ?HCT 47.0 44.9  ?MCV 102.0* 100.7*  ?MCH 29.3 29.6  ?MCHC 28.7* 29.4*  ?RDW 16.6* 16.6*  ?PLT 303 316  ? ?Thyroid No results for input(s): TSH, FREET4 in the last 168 hours.  ?BNP ?Recent Labs  ?Lab 03/31/21 ?1120  ?BNP 107.0*  ?  ?DDimer No results for input(s): DDIMER in the last 168 hours.  ? ?Radiology  ?  ?ECHOCARDIOGRAM COMPLETE ? ?Result Date: 04/06/2021 ?   ECHOCARDIOGRAM REPORT   Patient Name:   DREW LIPS Date of Exam: 04/05/2021 Medical Rec #:  [id number]      Height:       69.5 in Accession #:     [id number]     Weight:       456.9 lb Date of Birth:  [birth year]      BSA:          2.950 m? Patient Age:    61 years       BP:           134/57 mmHg Patient Gender: M              HR:           70 bpm. [facility] Procedure: 2D Echo, Cardiac Doppler and Color Doppler Indications:    Dyspnea R06.00  History:        Patient has prior history of Echocardiogram examinations. CHF,                 COPD; Risk Factors:Hypertension, Dyslipidemia and Diabetes.                 Morbid obesity, Obstructive sleep apnea.  Sonographer:    Celesta Gentile RCS Referring Phys: [id number] Dorothe Pea Gertha Lichtenberg  Sonographer Comments: Image acquisition challenging due to patient body habitus. Extremely difficult echo due to extremely poor acustic windows. IMPRESSIONS  1. Very limited visualization even with contrast. Probably normal LVEF 55-60%. . Left ventricular ejection fraction, by estimation, is 55 to 60%. The left ventricle has normal function. Left ventricular endocardial border not optimally defined to evaluate regional wall motion. not well visualized left ventricular hypertrophy. Left ventricular diastolic parameters are indeterminate.  2. Right ventricular systolic function  was not well visualized. The right ventricular size is not well visualized.  3. The mitral valve was not well visualized. not well visualized mitral valve regurgitation. note well visualized mitral stenosis.  4. Tricuspid valve regurgitation not well visualized. note well visualized tricuspid stenosis.  5. The aortic valve was not well visualized. Aortic valve regurgitation is not visualized.  6. Limited echo with contrast to assess LV function. Very limited visualization even with contrast given body habitus. FINDINGS  Left Ventricle: Very limited visualization even with contrast. Probably normal LVEF 55-60%. Left ventricular ejection fraction, by estimation, is 55 to 60%. The left ventricle has normal function. Left ventricular endocardial border  not optimally defined to evaluate regional wall motion. Definity contrast agent was given IV to delineate the left ventricular endocardial borders. The left ventricular internal cavity size was normal in size. Not well visualized left ventricular hypertrophy. Left ventricular diastolic parameters are indeterminate. Right Ventricle: The right ventricular size is not well visualized. Right vetricular wall thickness was not well visualized. Right ventricular systolic function was not well visualized. Left Atrium: Left atrial size was not well visualized. Right Atrium: Right atrial size was not well visualized. Pericardium: The pericardium was not well visualized. Mitral Valve: The mitral valve was not well visualized. There is mild thickening of the mitral valve leaflet(s). There is mild calcification of the mitral valve leaflet(s). Mild mitral annular calcification. Not well visualized mitral valve regurgitation. Note well visualized mitral valve stenosis. Tricuspid Valve: The tricuspid valve is not well visualized. Tricuspid valve regurgitation not well visualized. note well visualized tricuspid stenosis. Aortic Valve: The aortic valve was not well visualized. There is mild aortic valve annular calcification. Aortic valve regurgitation is not visualized. Pulmonic Valve: The pulmonic valve was not well visualized. Pulmonic valve regurgitation is not visualized. No evidence of pulmonic stenosis. Aorta: The aortic root is normal in size and structure. IAS/Shunts: The interatrial septum was not well visualized.  LEFT VENTRICLE PLAX 2D LVOT diam:     2.10 cm LVOT Area:     3.46 cm?   AORTA Ao Root diam: 3.20 cm MITRAL VALVE MV Area (PHT): 2.56 cm?     SHUNTS MV Decel Time: 296 msec     Systemic Diam: 2.10 cm MV E velocity: 108.00 cm/s MV A velocity: 104.00 cm/s MV E/A ratio:  1.04 Dina Rich MD Electronically signed by Dina Rich MD Signature Date/Time: 04/06/2021/8:18:22 AM    Final    ? ?Cardiac Studies   ? ? ?Patient Profile  ?   ?Thierno Hun is a 61 y.o. male with a hx of HFpEF, HTN, HLD, IDDM, COPD, OSA and morbid obesity who is being seen 04/01/2021 for the evaluation of CHF at the request of Dr. Sherryll Burger. ? ?Assessment & P

## 2021-04-07 NOTE — Progress Notes (Signed)
Pt on 2lpm cann when RT entered room spo2 88-89% o2 increased to 3lpm cann after breathing treatment, Pt home CPAP/BIPAP unit setup for use and pt with apply when ready to go to bed, instructed to call if my assistance is need ?

## 2021-04-07 NOTE — Progress Notes (Signed)
Physical Therapy Treatment ?Patient Details ?Name: Aaron Potter ?MRN: [id number] ?DOB: April 03, 1960 ?Today's Date: 04/07/2021 ? ? ?History of Present Illness Aaron Potter is a 61 y.o. male with medical history significant of morbid obesity, hypertension, dyslipidemia, insulin-dependent type 2 diabetes, OSA on BiPAP, and GERD who presented to the ED with worsening shortness of breath and lower extremity edema after gaining what appears to be approximately 60 pounds in the last 1 month.  He has had a recent appointment with his cardiologist Dr. Odis Hollingshead on 3/21 at which time he was noted to have a 17 pound weight gain since his last office visit.  He has remained compliant on his home diuretics and was changed over to torsemide from his usual Lasix on 3/21.  He is usually fairly short of breath with any amount of activity and appears to have worsening symptoms of sleep apnea.  He does not follow low-sodium diet.  Sister is at bedside.  He denies any chest pain, fevers, chills, or cough with sputum production. ? ?  ?PT Comments  ? ? Patient demonstrates increased endurance/distance for gait training with slightly labored cadence without loss of balance, on room air with SpO2 dropping from 95% to 87%.  Patient tolerated staying up seated at bedside an put on 2 LPM O2 with SpO2 at 93% - nursing staff notified.  Patient will benefit from continued skilled physical therapy in hospital and recommended venue below to increase strength, balance, endurance for safe ADLs and gait.  ?  ?Recommendations for follow up therapy are one component of a multi-disciplinary discharge planning process, led by the attending physician.  Recommendations may be updated based on patient status, additional functional criteria and insurance authorization. ? ?Follow Up Recommendations ? Home health PT ?  ?  ?Assistance Recommended at Discharge Set up Supervision/Assistance  ?Patient can return home with the following A little help with  bathing/dressing/bathroom;A little help with walking and/or transfers;Assistance with cooking/housework;Assist for transportation;Help with stairs or ramp for entrance ?  ?Equipment Recommendations ? None recommended by PT  ?  ?Recommendations for Other Services   ? ? ?  ?Precautions / Restrictions Precautions ?Precautions: Fall ?Restrictions ?Weight Bearing Restrictions: No  ?  ? ?Mobility ? Bed Mobility ?  ?  ?  ?  ?  ?  ?  ?General bed mobility comments: presents seated at EOB ?  ? ?Transfers ?Overall transfer level: Modified independent ?  ?  ?  ?  ?  ?  ?  ?  ?General transfer comment: good return for completing sit to stands from bed side ?  ? ?Ambulation/Gait ?Ambulation/Gait assistance: Supervision ?Gait Distance (Feet): 100 Feet ?Assistive device: IV Pole ?Gait Pattern/deviations: Decreased step length - right, Decreased step length - left, Decreased stride length ?Gait velocity: decreased ?  ?  ?General Gait Details: increased endurance/distance for gait training with slightly labored cadence, no loss of balance, on room air with SpO2 dropping from 95% to 87%, limited due to fatigue ? ? ?Stairs ?  ?  ?  ?  ?  ? ? ?Wheelchair Mobility ?  ? ?Modified Rankin (Stroke Patients Only) ?  ? ? ?  ?Balance Overall balance assessment: Needs assistance ?Sitting-balance support: Feet supported, No upper extremity supported ?Sitting balance-Leahy Scale: Good ?Sitting balance - Comments: seated at EOB ?  ?Standing balance support: During functional activity, Single extremity supported ?Standing balance-Leahy Scale: Fair ?Standing balance comment: fair/good pushing IV pole ?  ?  ?  ?  ?  ?  ?  ?  ?  ?  ?  ?  ? ?  ?  Cognition Arousal/Alertness: Awake/alert ?Behavior During Therapy: [facility] for tasks assessed/performed ?Overall Cognitive Status: Within Functional Limits for tasks assessed ?  ?  ?  ?  ?  ?  ?  ?  ?  ?  ?  ?  ?  ?  ?  ?  ?  ?  ?  ? ?  ?Exercises General Exercises - Lower Extremity ?Long Arc Quad: Seated, AROM,  Strengthening, Both, 10 reps ?Hip Flexion/Marching: Seated, AROM, Strengthening, Both, 10 reps ?Toe Raises: Seated, AROM, Strengthening, Both, 15 reps ?Heel Raises: Seated, AROM, Strengthening, Both, 15 reps ? ?  ?General Comments   ?  ?  ? ?Pertinent Vitals/Pain Pain Assessment ?Pain Assessment: No/denies pain  ? ? ?Home Living   ?  ?  ?  ?  ?  ?  ?  ?  ?  ?   ?  ?Prior Function    ?  ?  ?   ? ?PT Goals (current goals can now be found in the care plan section) Acute Rehab PT Goals ?Patient Stated Goal: return home and improve mobility ?PT Goal Formulation: With patient ?Time For Goal Achievement: 04/16/21 ?Potential to Achieve Goals: Good ?Progress towards PT goals: Progressing toward goals ? ?  ?Frequency ? ? ? Min 3X/week ? ? ? ?  ?PT Plan Current plan remains appropriate  ? ? ?Co-evaluation   ?  ?  ?  ?  ? ?  ?AM-PAC PT "6 Clicks" Mobility   ?Outcome Measure ? Help needed turning from your back to your side while in a flat bed without using bedrails?: None ?Help needed moving from lying on your back to sitting on the side of a flat bed without using bedrails?: A Little ?Help needed moving to and from a bed to a chair (including a wheelchair)?: A Little ?Help needed standing up from a chair using your arms (e.g., wheelchair or bedside chair)?: None ?Help needed to walk in hospital room?: A Little ?Help needed climbing 3-5 steps with a railing? : A Little ?6 Click Score: 20 ? ?  ?End of Session Equipment Utilized During Treatment: Oxygen ?Activity Tolerance: Patient tolerated treatment well;Patient limited by fatigue ?Patient left: in bed;with call bell/phone within reach ?Nurse Communication: Mobility status ?PT Visit Diagnosis: Unsteadiness on feet (R26.81);Other abnormalities of gait and mobility (R26.89);Muscle weakness (generalized) (M62.81) ?  ? ? ?Time: 0086-7619 ?PT Time Calculation (min) (ACUTE ONLY): 22 min ? ?Charges:  $Gait Training: 8-22 mins ?$Therapeutic Exercise: 8-22 mins          ?           ? ?3:23 PM, 04/07/21 ?Ocie Bob, MPT ?Physical Therapist with [facility] ?[facility] ?[phone] office ?[phone] mobile phone ? ? ?

## 2021-04-08 DIAGNOSIS — I509 Heart failure, unspecified: Secondary | ICD-10-CM | POA: Diagnosis not present

## 2021-04-08 LAB — MAGNESIUM: Magnesium: 2.1 mg/dL (ref 1.7–2.4)

## 2021-04-08 LAB — COMPREHENSIVE METABOLIC PANEL
ALT: 23 U/L (ref 0–44)
AST: 34 U/L (ref 15–41)
Albumin: 3.6 g/dL (ref 3.5–5.0)
Alkaline Phosphatase: 62 U/L (ref 38–126)
Anion gap: 14 (ref 5–15)
BUN: 28 mg/dL — ABNORMAL HIGH (ref 6–20)
CO2: 37 mmol/L — ABNORMAL HIGH (ref 22–32)
Calcium: 9.3 mg/dL (ref 8.9–10.3)
Chloride: 86 mmol/L — ABNORMAL LOW (ref 98–111)
Creatinine, Ser: 1.16 mg/dL (ref 0.61–1.24)
GFR, Estimated: >60 mL/min (ref >60)
Glucose, Bld: 262 mg/dL — ABNORMAL HIGH (ref 70–99)
Potassium: 3.6 mmol/L (ref 3.5–5.1)
Sodium: 137 mmol/L (ref 135–145)
Total Bilirubin: 1.2 mg/dL (ref 0.3–1.2)
Total Protein: 7.1 g/dL (ref 6.5–8.1)

## 2021-04-08 LAB — GLUCOSE, CAPILLARY
Glucose-Capillary: 278 mg/dL — ABNORMAL HIGH (ref 70–99)
Glucose-Capillary: 353 mg/dL — ABNORMAL HIGH (ref 70–99)
Glucose-Capillary: 337 mg/dL — ABNORMAL HIGH (ref 70–99)
Glucose-Capillary: 316 mg/dL — ABNORMAL HIGH (ref 70–99)

## 2021-04-08 MED ORDER — SPIRONOLACTONE 25 MG PO TABS
25.0000 mg | ORAL_TABLET | Freq: Every day | ORAL | Status: DC
  Administered 2021-04-08 – 2021-04-09 (×2): 25 mg via ORAL
  Filled 2021-04-08 (×2): qty 1

## 2021-04-08 MED ORDER — ENOXAPARIN SODIUM 100 MG/ML IJ SOSY
100.0000 mg | PREFILLED_SYRINGE | INTRAMUSCULAR | Status: DC
  Administered 2021-04-08: 100 mg via SUBCUTANEOUS
  Filled 2021-04-08: qty 1

## 2021-04-08 MED ORDER — INSULIN ASPART 100 UNIT/ML IJ SOLN
10.0000 [IU] | Freq: Three times a day (TID) | INTRAMUSCULAR | Status: DC
  Administered 2021-04-08 – 2021-04-09 (×4): 10 [IU] via SUBCUTANEOUS

## 2021-04-08 MED ORDER — INSULIN DETEMIR 100 UNIT/ML ~~LOC~~ SOLN
60.0000 [IU] | Freq: Two times a day (BID) | SUBCUTANEOUS | Status: DC
  Administered 2021-04-08 – 2021-04-09 (×3): 60 [IU] via SUBCUTANEOUS
  Filled 2021-04-08 (×5): qty 0.6

## 2021-04-08 NOTE — Progress Notes (Signed)
?PROGRESS NOTE ? ? ? ?Aaron Potter  [id number] DOB: [birth year] DOA: 03/31/2021 ?PCP: Leeanne Rio, MD ? ? ?Brief Narrative:  ?Per HPI: ?Aaron Potter is a 61 y.o. male with medical history significant of morbid obesity, hypertension, dyslipidemia, insulin-dependent type 2 diabetes, OSA on BiPAP, and GERD who presented to the ED with worsening shortness of breath and lower extremity edema after gaining what appears to be approximately 60 pounds in the last 1 month.  He has had a recent appointment with his cardiologist Dr. Terri Skains on 3/21 at which time he was noted to have a 17 pound weight gain since his last office visit.  He has remained compliant on his home diuretics and was changed over to torsemide from his usual Lasix on 3/21.  He is usually fairly short of breath with any amount of activity and appears to have worsening symptoms of sleep apnea.  He does not follow low-sodium diet.  Sister is at bedside.  He denies any chest pain, fevers, chills, or cough with sputum production. ? ?Patient continues to have significant diuresis with ongoing Lasix drip.  ? ? ?Assessment & Plan: ?  ?Principal Problem: ?  Acute CHF ([place]) ?Active Problems: ?  Morbid obesity ([place]) ?  Obstructive sleep apnea ?  Essential (primary) hypertension ?  Gastro-esophageal reflux disease with esophagitis ?  Diabetes mellitus ([place]) ?  Chronic respiratory failure with hypoxia (HCC) ? ?Assessment and Plan: ? ? ?Acute on chronic HFpEF (HCC) ?Anasarca-severely volume overload ?Apparently patient was weighing 491 today 453 pounds ?(Unclear what the baseline weight is.Marland Kitchen) ?Still in respiratory stress, O2 demand has increased from 4 to 6.5 L, satting 96% ?  ?-Continue on Lasix drip, dose increased by Dr. Harl Bowie today ?  ?-Still on Lasix drip, 1 dose of acetazolamide was given on 04/03/2021 ?Second dose of acetazolamide scheduled for today 04/05/2021 ?   ?  ?2D echocardiogram: Limited study possible moderate AAS, ?Suboptimal, even with contrast  which was repeated on 04/05/2021 ?  ?  ?-On Lasix drip which is 10 mg/hour ?  ?Strict I's and O's ?-It appears the patient has lost close to 12 L since admission ?  ?Holding Olmesartan for now ?-Spironolactone started 4/6, may require Bumex versus torsemide on discharge per cardiology ?  ?  ?Chronic respiratory failure with hypoxia (HCC) ?-O2 demand has increased from 4 to 6.5 L via nasal cannula, satting 96% ?  ?Baseline- 4 L nasal cannula at home ?Interested in having a concentrator at discharge ?Continue as needed DuoNeb bronchodilator treatment ?  ?Diabetes mellitus ([place]) ?Hyperglycemic ?-Holding home medication metformin and glipizide ?-Elevated blood sugars..  ?-Increasing basal insulin -Levemir to 60 units twice daily ?Added NovoLog 10 units 3 times daily with meals ?Titrating insulin up ?Along with SSI coverage ?  ?  ?Gastro-esophageal reflux disease with esophagitis ?PPI ?  ?Essential (primary) hypertension ?-Blood pressure soft, monitoring ?Continue home verapamil,  ?-hold olmesartan while on aggressive IV diuresis ?-Verapamil dose decreased due to sinus pauses ?-Currently stable, monitoring ?  ?  ?Obstructive sleep apnea ?Plan to keep on BiPAP overnight ?-Compliant ?  ?Morbid obesity ([place]) ?Lifestyle changes outpatient ?-Discussed with the patient and his sisters at bedside regarding follow-up with PCP with aggressive weight loss program, dietary modification ?We will consult nutrition ?  ?Hypokalemia ?Continue to replete and monitor ?  ?  ?DVT prophylaxis: Lovenox ?Code Status: Full ?Family Communication: Family at bedside 4/5 ?Disposition Plan:  ?Status is: Inpatient ?Remains inpatient appropriate because: Need for ongoing IV diuresis. ?  ?  Consultants:  ?Cardiology ?  ?Procedures:  ?See below ?  ?Antimicrobials:  ?None ?  ?Subjective: ?Patient seen and evaluated today with improved shortness of breath and feels as though he is back to baseline.  He would like to go home without oxygen if  possible. ? ?Objective: ?Vitals:  ? 04/07/21 2119 04/08/21 0457 04/08/21 0500 04/08/21 0746  ?BP: (!) 123/55 123/61    ?Pulse: 66 72    ?Resp: 16 18    ?Temp: 98.3 ?F (36.8 ?C) 98.5 ?F (36.9 ?C)    ?TempSrc: Oral Oral    ?SpO2: 94% 94%  94%  ?Weight:   (!) 205.2 kg   ?Height:      ? ? ?Intake/Output Summary (Last 24 hours) at 04/08/2021 1045 ?Last data filed at 04/08/2021 1018 ?Gross per 24 hour  ?Intake 723 ml  ?Output 2200 ml  ?Net -1477 ml  ? ?Filed Weights  ? 04/06/21 0500 04/07/21 0500 04/08/21 0500  ?Weight: (!) 206.3 kg (!) 205.9 kg (!) 205.2 kg  ? ? ?Examination: ? ?General exam: Appears calm and comfortable, morbidly obese ?Respiratory system: Clear to auscultation. Respiratory effort normal.  Currently on 3 L nasal cannula ?Cardiovascular system: S1 & S2 heard, RRR.  ?Gastrointestinal system: Abdomen is soft ?Central nervous system: Alert and awake ?Extremities: Persistent lower extremity edema with chronic venous stasis ulcer changes ?Skin: No significant lesions noted ?Psychiatry: Flat affect. ? ? ? ?Data Reviewed: I have personally reviewed following labs and imaging studies ? ?CBC: ?No results for input(s): WBC, NEUTROABS, HGB, HCT, MCV, PLT in the last 168 hours. ?Basic Metabolic Panel: ?Recent Labs  ?Lab 04/02/21 ?0450 04/03/21 ?0559 04/04/21 ?[id number] 04/05/21 ?[id number] 04/06/21 ?[id number] 04/06/21 ?1756 04/07/21 ?0530 04/07/21 ?1217 04/08/21 ?0618  ?NA 138   < > 136 135 136  --  134*  --  137  ?K 4.6   < > 3.7 3.7 3.4*  --  3.2*  --  3.6  ?CL 89*   < > 84* 82* 82*  --  82*  --  86*  ?CO2 39*   < > 38* 43* 40*  --  39*  --  37*  ?GLUCOSE 263*   < > 257* 321* 334* 443* 326* 432* 262*  ?BUN 17   < > 16 21* 24*  --  29*  --  28*  ?CREATININE 1.02   < > 0.95 0.99 1.01  --  1.07  --  1.16  ?CALCIUM 9.3   < > 9.3 9.3 9.5  --  9.1  --  9.3  ?MG 1.7  --   --   --  1.7  --   --   --  2.1  ? < > = values in this interval not displayed.  ? ?GFR: ?Estimated Creatinine Clearance: 122.9 mL/min (by C-G formula based on SCr of 1.16  mg/dL). ?Liver Function Tests: ?Recent Labs  ?Lab 04/06/21 ?[id number] 04/07/21 ?0530 04/08/21 ?0618  ?AST  --  30 34  ?ALT  --  22 23  ?ALKPHOS  --  [id number]  ?BILITOT  --  1.3* 1.2  ?PROT  --  7.1 7.1  ?ALBUMIN 3.8 3.6 3.6  ? ?No results for input(s): LIPASE, AMYLASE in the last 168 hours. ?No results for input(s): AMMONIA in the last 168 hours. ?Coagulation Profile: ?No results for input(s): INR, PROTIME in the last 168 hours. ?Cardiac Enzymes: ?No results for input(s): CKTOTAL, CKMB, CKMBINDEX, TROPONINI in the last 168 hours. ?BNP (last 3 results) ?Recent Labs  ?  08/12/20 ?1036  ?PROBNP 122  ? ?HbA1C: ?No results for input(s): HGBA1C in the last 72 hours. ?CBG: ?Recent Labs  ?Lab 04/07/21 ?1152 04/07/21 ?1513 04/07/21 ?1713 04/07/21 ?2143 04/08/21 ?[id number]  ?GLUCAP 425* 344* 318* 284* 278*  ? ?Lipid Profile: ?No results for input(s): CHOL, HDL, LDLCALC, TRIG, CHOLHDL, LDLDIRECT in the last 72 hours. ?Thyroid Function Tests: ?No results for input(s): TSH, T4TOTAL, FREET4, T3FREE, THYROIDAB in the last 72 hours. ?Anemia Panel: ?No results for input(s): VITAMINB12, FOLATE, FERRITIN, TIBC, IRON, RETICCTPCT in the last 72 hours. ?Sepsis Labs: ?No results for input(s): PROCALCITON, LATICACIDVEN in the last 168 hours. ? ?No results found for this or any previous visit (from the past 240 hour(s)).  ? ? ? ? ? ?Radiology Studies: ?No results found. ? ? ? ? ? ?Scheduled Meds: ? albuterol  2.5 mg Nebulization TID  ? allopurinol  300 mg Oral Daily  ? enoxaparin (LOVENOX) injection  110 mg Subcutaneous Q24H  ? insulin aspart  0-20 Units Subcutaneous TID WC  ? insulin aspart  0-5 Units Subcutaneous QHS  ? insulin aspart  10 Units Subcutaneous TID WC  ? insulin detemir  60 Units Subcutaneous BID  ? multivitamin with minerals  1 tablet Oral Daily  ? pantoprazole  40 mg Oral Daily  ? potassium chloride  40 mEq Oral BID  ? pravastatin  40 mg Oral Daily  ? sodium chloride flush  3 mL Intravenous Q12H  ? spironolactone  25 mg Oral Daily  ?  verapamil  120 mg Oral Daily  ? ?Continuous Infusions: ? sodium chloride    ? furosemide (LASIX) 200 mg in dextrose 5% 100 mL (2mg /mL) infusion 10 mg/hr (04/07/21 1718)  ? ? ? LOS: 8 days  ? ? ?Time spent: 35 minutes ?

## 2021-04-08 NOTE — Progress Notes (Signed)
Inpatient Diabetes Program Recommendations ? ?AACE/ADA: New Consensus Statement on Inpatient Glycemic Control  ? ?Target Ranges:  Prepandial:   less than 140 mg/dL ?     Peak postprandial:   less than 180 mg/dL (1-2 hours) ?     Critically ill patients:  140 - 180 mg/dL  ? ? Latest Reference Range & Units 04/08/21 07:17  ?Glucose-Capillary 70 - 99 mg/dL 400 (H)  ? ? Latest Reference Range & Units 04/07/21 07:59 04/07/21 11:52 04/07/21 15:13 04/07/21 17:13 04/07/21 21:43  ?Glucose-Capillary 70 - 99 mg/dL 867 (H) 619 (H) 509 (H) 318 (H) 284 (H)  ? ?Review of Glycemic Control ? ?Diabetes history: DM2 ?Outpatient Diabetes medications: Humulin R U500 130 units with breakfast, 100 units with lunch and supper, Metformin 1000 mg BID, Glipizide XL 5 mg daily, Bydureon 2 mg Qweek  ?Current orders for Inpatient glycemic control: Levemir 50 units BID, Novolog 0-20 units TID with meals, Novolog 0-5 units QHS, Novolog 7 units TID with meals ? ?Inpatient Diabetes Program Recommendations:   ? ?Insulin: Please consider increasing Semglee to 60 units BID and meal coverage to Novolog 10 units TID with meals. ? ?Thanks, ?Orlando Penner, RN, MSN, CDE ?Diabetes Coordinator ?Inpatient Diabetes Program ?[phone] (Team Pager from 8am to 5pm) ? ? ? ? ?

## 2021-04-08 NOTE — Progress Notes (Signed)
Pt had CPAP on when RT entered room, CPAP wasn't connected to O2, spo2 89-90% o2 connected with 1lpm cann for QHS, pt will place himself on and I'm available for assistance if needed ?

## 2021-04-08 NOTE — Progress Notes (Signed)
? ?Progress Note ? ?Patient Name: Aaron Potter ?Date of Encounter: 04/08/2021 ? ?[facility] Cardiologist: Tessa Lerner, DO  ? ?Subjective  ? ?Feeling much improved today. States he feels like he is back to his baseline and is hoping to go home soon.  ? ?Inpatient Medications  ?  ?Scheduled Meds: ? albuterol  2.5 mg Nebulization TID  ? allopurinol  300 mg Oral Daily  ? enoxaparin (LOVENOX) injection  110 mg Subcutaneous Q24H  ? insulin aspart  0-20 Units Subcutaneous TID WC  ? insulin aspart  0-5 Units Subcutaneous QHS  ? insulin aspart  10 Units Subcutaneous TID WC  ? insulin detemir  60 Units Subcutaneous BID  ? multivitamin with minerals  1 tablet Oral Daily  ? pantoprazole  40 mg Oral Daily  ? potassium chloride  40 mEq Oral BID  ? pravastatin  40 mg Oral Daily  ? sodium chloride flush  3 mL Intravenous Q12H  ? verapamil  120 mg Oral Daily  ? ?Continuous Infusions: ? sodium chloride    ? furosemide (LASIX) 200 mg in dextrose 5% 100 mL (2mg /mL) infusion 10 mg/hr (04/07/21 1718)  ? ?PRN Meds: ?sodium chloride, acetaminophen **OR** acetaminophen, albuterol, ALPRAZolam, diclofenac Sodium, ondansetron **OR** ondansetron (ZOFRAN) IV, oxyCODONE-acetaminophen, sodium chloride flush  ? ?Vital Signs  ?  ?Vitals:  ? 04/07/21 2119 04/08/21 0457 04/08/21 0500 04/08/21 0746  ?BP: (!) 123/55 123/61    ?Pulse: 66 72    ?Resp: 16 18    ?Temp: 98.3 ?F (36.8 ?C) 98.5 ?F (36.9 ?C)    ?TempSrc: Oral Oral    ?SpO2: 94% 94%  94%  ?Weight:   (!) 205.2 kg   ?Height:      ? ? ?Intake/Output Summary (Last 24 hours) at 04/08/2021 0917 ?Last data filed at 04/08/2021 06/08/2021 ?Gross per 24 hour  ?Intake 483 ml  ?Output 1500 ml  ?Net -1017 ml  ? ? ?  04/08/2021  ?  5:00 AM 04/07/2021  ?  5:00 AM 04/06/2021  ?  5:00 AM  ?Last 3 Weights  ?Weight (lbs) 452 lb 4.8 oz 453 lb 14.8 oz 454 lb 12.8 oz  ?Weight (kg) 205.162 kg 205.9 kg 206.296 kg  ?   ? ?Telemetry  ?  ?NSR - Personally Reviewed ? ?ECG  ?  ?No new tracing - Personally Reviewed ? ?Physical Exam   ? ?GEN: Morbidly obese, NAD ?Neck: JVD difficult to assess due to body habitus ?Cardiac: Distant, regular, no murmurs apprecaited ?Respiratory: Clear to auscultation bilaterally. ?GI: Obese, soft, NTTP ?MS: 1-2+ pitting edema to mid-shin; chronic venous stasis changes ?Neuro:  Nonfocal  ?Psych: Normal affect  ? ?Labs  ?  ?High Sensitivity Troponin:  No results for input(s): TROPONINIHS in the last 720 hours.   ?Chemistry ?Recent Labs  ?Lab 04/02/21 ?0450 04/03/21 ?0559 04/06/21 ?06/06/21 04/06/21 ?1756 04/07/21 ?0530 04/07/21 ?1217 04/08/21 ?0618  ?NA 138   < > 136  --  134*  --  137  ?K 4.6   < > 3.4*  --  3.2*  --  3.6  ?CL 89*   < > 82*  --  82*  --  86*  ?CO2 39*   < > 40*  --  39*  --  37*  ?GLUCOSE 263*   < > 334*   < > 326* 432* 262*  ?BUN 17   < > 24*  --  29*  --  28*  ?CREATININE 1.02   < > 1.01  --  1.07  --  1.16  ?CALCIUM 9.3   < > 9.5  --  9.1  --  9.3  ?MG 1.7  --  1.7  --   --   --  2.1  ?PROT  --   --   --   --  7.1  --  7.1  ?ALBUMIN  --   --  3.8  --  3.6  --  3.6  ?AST  --   --   --   --  30  --  34  ?ALT  --   --   --   --  22  --  23  ?ALKPHOS  --   --   --   --  57  --  62  ?BILITOT  --   --   --   --  1.3*  --  1.2  ?GFRNONAA >60   < > >60  --  >60  --  >60  ?ANIONGAP 10   < > 14  --  13  --  14  ? < > = values in this interval not displayed.  ?  ?Lipids No results for input(s): CHOL, TRIG, HDL, LABVLDL, LDLCALC, CHOLHDL in the last 168 hours.  ?HematologyNo results for input(s): WBC, RBC, HGB, HCT, MCV, MCH, MCHC, RDW, PLT in the last 168 hours. ?Thyroid No results for input(s): TSH, FREET4 in the last 168 hours.  ?BNPNo results for input(s): BNP, PROBNP in the last 168 hours.  ?DDimer No results for input(s): DDIMER in the last 168 hours.  ? ?Radiology  ?  ?No results found. ? ?Cardiac Studies  ? ?TTE 04/05/21: ?. Very limited visualization even with contrast. Probably normal LVEF  ?55-60%. . Left ventricular ejection fraction, by estimation, is 55 to 60%.  ?The left ventricle has normal  function. Left ventricular endocardial  ?border not optimally defined to  ?evaluate regional wall motion. not well visualized left ventricular  ?hypertrophy. Left ventricular diastolic parameters are indeterminate.  ? 2. Right ventricular systolic function was not well visualized. The right  ?ventricular size is not well visualized.  ? 3. The mitral valve was not well visualized. not well visualized mitral  ?valve regurgitation. note well visualized mitral stenosis.  ? 4. Tricuspid valve regurgitation not well visualized. note well  ?visualized tricuspid stenosis.  ? 5. The aortic valve was not well visualized. Aortic valve regurgitation  ?is not visualized.  ? 6. Limited echo with contrast to assess LV function. Very limited  ?visualization even with contrast given body habitus.  ? ?Patient Profile  ?   ?61 y.o. male with a history of HFpEF, HTN, HLD, IDDM, COPD, OSA and morbid obesity who presented with worsening SOB, LE edema and weight gain found to have acute on chronic diastolic HF exacerbation for which Cardiology has been consulted.  ? ?Assessment & Plan  ?  ?#Acute on Chronic HF Exacerbation: ?TTE very limited due to body habitus and poor visualization but appears to be 50-60%. Presented with worsening SOB, LE edema and weight gain consistent with acute HF exacerbation. Has been diuresing with lasix gtt. If wt accurate, he has gone from 491lbs>452.3lbs. Unclear baseline wt which was 458lb in August and 439lbs in September. Suspect dietary indiscretions are primary driver of fluid overload.  Suspect we are getting close to his dry weight as Cr and BUN rising slightly.  ?-Continue lasix gtt @10  for today; possibly transition to PO tomorrow ?-Favor starting bumex vs torsemide for maintenance diuretic ?-Start spironolactone 25mg  daily ?-Volume status  very difficult to gauge; may benefit from RHC at some point ? ?#HTN: ?Controlled ?-Continue verapamil 120mg  daily ? ?#HLD: ?-On prava 40mg  daily ? ?#Morbid Obesity  with BMI 65: ?-Needs continued lifestyle modifications  ? ?   ? ?For questions or updates, please contact [facility] ?Please consult [email or web address] for contact info under  ? ?  ?   ?Signed, ?Meriam SpragueHeather E Honore Wipperfurth, MD  ?04/08/2021, 9:17 AM    ?

## 2021-04-09 LAB — BASIC METABOLIC PANEL
Anion gap: 13 (ref 5–15)
BUN: 27 mg/dL — ABNORMAL HIGH (ref 6–20)
CO2: 36 mmol/L — ABNORMAL HIGH (ref 22–32)
Calcium: 9.4 mg/dL (ref 8.9–10.3)
Chloride: 88 mmol/L — ABNORMAL LOW (ref 98–111)
Creatinine, Ser: 1.04 mg/dL (ref 0.61–1.24)
GFR, Estimated: >60 mL/min (ref >60)
Glucose, Bld: 273 mg/dL — ABNORMAL HIGH (ref 70–99)
Potassium: 3.4 mmol/L — ABNORMAL LOW (ref 3.5–5.1)
Sodium: 137 mmol/L (ref 135–145)

## 2021-04-09 LAB — GLUCOSE, CAPILLARY
Glucose-Capillary: 237 mg/dL — ABNORMAL HIGH (ref 70–99)
Glucose-Capillary: 336 mg/dL — ABNORMAL HIGH (ref 70–99)

## 2021-04-09 LAB — MAGNESIUM: Magnesium: 2.2 mg/dL (ref 1.7–2.4)

## 2021-04-09 MED ORDER — SPIRONOLACTONE 25 MG PO TABS: 25.0000 mg | ORAL_TABLET | Freq: Every day | ORAL | 0 refills | Status: DC

## 2021-04-09 MED ORDER — BUMETANIDE 1 MG PO TABS
1.0000 mg | ORAL_TABLET | Freq: Every day | ORAL | 11 refills | Status: AC
Start: 2021-04-09 — End: 2023-09-25

## 2021-04-09 MED ORDER — VERAPAMIL HCL ER 120 MG PO TBCR: 120.0000 mg | EXTENDED_RELEASE_TABLET | Freq: Every day | ORAL | 0 refills | Status: AC

## 2021-04-09 MED ORDER — POTASSIUM CHLORIDE CRYS ER 20 MEQ PO TBCR: 20.0000 meq | EXTENDED_RELEASE_TABLET | Freq: Every day | ORAL | 0 refills | Status: DC

## 2021-04-09 NOTE — Discharge Summary (Signed)
Physician Discharge Summary  ?Derrich Gaby QPY:[id number] DOB: [birth year] DOA: 03/31/2021 ? ?PCP: Leeanne Rio, MD ? ?Admit date: 03/31/2021 ? ?Discharge date: 04/09/2021 ? ?Admitted From:Home ? ?Disposition:  Home ? ?Recommendations for Outpatient Follow-up:  ?Follow up with PCP in 1-2 weeks ?Remain on Bumex 1 mg daily along with spironolactone as prescribed ?Counseled on monitoring daily weights and to call cardiology office for weight gain of 3 pounds or greater in 24 hours and 5 pounds or greater in 1 week ?Continue potassium supplementation ?Recommended following up with cardiologist Dr. Terri Skains early next week for repeat labs and monitoring of volume status ? ?Home Health: None ? ?Equipment/Devices: None ? ?Discharge Condition:Stable ? ?CODE STATUS: Full ? ?Diet recommendation: Heart Healthy/carb modified with 2 L fluid restriction ? ?Brief/Interim Summary: ?Per HPI: ?Aaron Potter is a 61 y.o. male with medical history significant of morbid obesity, hypertension, dyslipidemia, insulin-dependent type 2 diabetes, OSA on BiPAP, and GERD who presented to the ED with worsening shortness of breath and lower extremity edema after gaining what appears to be approximately 60 pounds in the last 1 month.  He has had a recent appointment with his cardiologist Dr. Terri Skains on 3/21 at which time he was noted to have a 17 pound weight gain since his last office visit.  He has remained compliant on his home diuretics and was changed over to torsemide from his usual Lasix on 3/21.  He is usually fairly short of breath with any amount of activity and appears to have worsening symptoms of sleep apnea.  He does not follow low-sodium diet.  Sister is at bedside.  He denies any chest pain, fevers, chills, or cough with sputum production. ? ?-Patient was admitted with significant volume overload and acute on chronic diastolic heart failure exacerbation.  LVEF 50-60% noted.  He has been diuresing with Lasix drip for over 1 week and has  gone from 491 pounds to 452 pounds.  He continues to have some mild volume overload, but is eager to discharge home and has been seen by cardiology with recommendations to transition to Bumex 1 mg daily as well as continue spironolactone 25 mg daily.  He is at high risk for readmission given his dietary indiscretion and overall volume status.  He has been recommended to monitor daily weights closely and follow fluid restriction as well as sodium restriction.  He has been encouraged to follow-up with his cardiologist as noted above early next week for close follow-up. ? ?Discharge Diagnoses:  ?Principal Problem: ?  Acute CHF ([place]) ?Active Problems: ?  Morbid obesity ([place]) ?  Obstructive sleep apnea ?  Essential (primary) hypertension ?  Gastro-esophageal reflux disease with esophagitis ?  Diabetes mellitus ([place]) ?  Chronic respiratory failure with hypoxia (HCC) ? ?Principal discharge diagnosis: Acute on chronic diastolic heart failure exacerbation. ? ?Discharge Instructions ? ?Discharge Instructions   ? ? Ambulatory referral to Cardiology   Complete by: As directed ?  ? Ambulatory referral to Physical Therapy   Complete by: As directed ?  ? Diet - low sodium heart healthy   Complete by: As directed ?  ? Increase activity slowly   Complete by: As directed ?  ? ?  ? ?Allergies as of 04/09/2021   ? ?   Reactions  ? Biaxin [clarithromycin] Anaphylaxis, Shortness Of Breath  ? Ace Inhibitors   ? D/c ACEi 05/20/2019 > changed to clonidine for pain control component  ?Although even in retrospect it may not be clear the ACEi contributed to  the pt's symptoms, adding them back at this point or in the future would risk confusion in interpretation of non-specific respiratory symptoms to which this patient is prone ie Better not to muddy the waters here.    ? ?  ? ?  ?Medication List  ?  ? ?STOP taking these medications   ? ?torsemide 10 MG tablet ?Commonly known as: DEMADEX ?  ? ?  ? ?TAKE these medications   ? ?Accu-Chek Aviva Plus  test strip ?Generic drug: glucose blood ?USE TO CHECK BLOOD SUGAR 4 TIMES DAILY. ?  ?Accu-Chek FastClix Lancets Misc ?USE TO CHECK BLOOD SUGAR FOUR TIMES A DAY. ?  ?Accu-Chek Guide w/Device Kit ?1 Piece by Does not apply route as directed. ?  ?albuterol (2.5 MG/3ML) 0.083% nebulizer solution ?Commonly known as: PROVENTIL ?Take 3 mLs (2.5 mg total) by nebulization every 6 (six) hours as needed for wheezing or shortness of breath. ?  ?allopurinol 300 MG tablet ?Commonly known as: ZYLOPRIM ?Take 300 mg by mouth daily. ?  ?ALPRAZolam 0.5 MG tablet ?Commonly known as: Duanne Moron ?Take 1 tablet by mouth 2 (two) times daily as needed for anxiety. ?  ?bumetanide 1 MG tablet ?Commonly known as: BUMEX ?Take 1 tablet (1 mg total) by mouth daily. ?  ?Bydureon BCise 2 MG/0.85ML Auij ?Generic drug: Exenatide ER ?INJECT INTO THE SKIN ONCE A WEEK. ?What changed: See the new instructions. ?  ?diclofenac Sodium 1 % Gel ?Commonly known as: VOLTAREN ?Apply 2 g topically 4 (four) times daily as needed (pain). ?  ?glipiZIDE 5 MG 24 hr tablet ?Commonly known as: GLUCOTROL XL ?TAKE 1 TABLET ONCE DAILY WITH BREAKFAST. ?What changed: See the new instructions. ?  ?Global Ease Inject Pen Needles 31G X 8 MM Misc ?Generic drug: Insulin Pen Needle ?USE 4 TIMES DAILY. ?  ?HumuLIN R U-500 KwikPen 500 UNIT/ML KwikPen ?Generic drug: insulin regular human CONCENTRATED ?Inject 100-130 Units into the skin 3 (three) times daily with meals. ?  ?metFORMIN 500 MG tablet ?Commonly known as: GLUCOPHAGE ?TAKE 2 TABLETS BY MOUTH TWICE DAILY. ?What changed: when to take this ?  ?multivitamin with minerals Tabs tablet ?Take 1 tablet by mouth daily. ?  ?olmesartan 20 MG tablet ?Commonly known as: BENICAR ?Take 1 tablet (20 mg total) by mouth daily. ?  ?oxyCODONE-acetaminophen 7.5-325 MG tablet ?Commonly known as: PERCOCET ?Take 1 tablet by mouth every 6 (six) hours as needed for severe pain. ?  ?pantoprazole 40 MG tablet ?Commonly known as: PROTONIX ?TAKE 1 TABLET  DAILY, 30 MINUTES BEFORE BREAKFAST ` ?What changed: See the new instructions. ?  ?potassium chloride SA 20 MEQ tablet ?Commonly known as: KLOR-CON M ?Take 1 tablet (20 mEq total) by mouth daily. ?  ?pravastatin 40 MG tablet ?Commonly known as: PRAVACHOL ?Take 40 mg by mouth daily. ?  ?spironolactone 25 MG tablet ?Commonly known as: ALDACTONE ?Take 1 tablet (25 mg total) by mouth daily. ?Start taking on: April 10, 2021 ?  ?verapamil 120 MG CR tablet ?Commonly known as: CALAN-SR ?Take 1 tablet (120 mg total) by mouth daily. ?Start taking on: April 10, 2021 ?What changed:  ?medication strength ?how much to take ?  ?Vitamin D (Ergocalciferol) 1.25 MG (50000 UNIT) Caps capsule ?Commonly known as: DRISDOL ?Take 1 capsule (50,000 Units total) by mouth once a week. ?  ? ?  ? ? ? ?Allergies  ?Allergen Reactions  ? Biaxin [Clarithromycin] Anaphylaxis and Shortness Of Breath  ? Ace Inhibitors   ?  D/c ACEi 05/20/2019 > changed to  clonidine for pain control component  ? ?Although even in retrospect it may not be clear the ACEi contributed to the pt's symptoms, adding them back at this point or in the future would risk confusion in interpretation of non-specific respiratory symptoms to which this patient is prone ie Better not to muddy the waters here.    ? ? ?Consultations: ?Cardiology ? ? ?Procedures/Studies: ?DG Chest Port 1 View ? ?Result Date: 03/31/2021 ?CLINICAL DATA:  Shortness of breath. EXAM: PORTABLE CHEST 1 VIEW COMPARISON:  February 18, 2021. FINDINGS: Mild cardiomegaly is noted. Both lungs are clear. The visualized skeletal structures are unremarkable. IMPRESSION: Mild cardiomegaly.  No acute pulmonary abnormality seen. Electronically Signed   By: Marijo Conception M.D.   On: 03/31/2021 11:52  ? ?ECHOCARDIOGRAM COMPLETE ? ?Result Date: 04/06/2021 ?   ECHOCARDIOGRAM REPORT   Patient Name:   Aaron Potter Date of Exam: 04/05/2021 Medical Rec #:  [id number]      Height:       69.5 in Accession #:    [id number]     Weight:        456.9 lb Date of Birth:  Oct 15, 1960      BSA:          2.950 m? Patient Age:    61 years       BP:           134/57 mmHg Patient Gender: M              HR:           70 bpm. Exam Location:  [facility]

## 2021-04-09 NOTE — Progress Notes (Signed)
Inpatient Diabetes Program Recommendations ? ?AACE/ADA: New Consensus Statement on Inpatient Glycemic Control  ? ?Target Ranges:  Prepandial:   less than 140 mg/dL ?     Peak postprandial:   less than 180 mg/dL (1-2 hours) ?     Critically ill patients:  140 - 180 mg/dL  ? ? ? ?Review of Glycemic Control ? Latest Reference Range & Units 04/08/21 07:17 04/08/21 11:16 04/08/21 16:06 04/08/21 21:28 04/09/21 07:19  ?Glucose-Capillary 70 - 99 mg/dL 278 (H) 353 (H) 337 (H) 316 (H) 237 (H)  ? ?Diabetes history: DM2 ?Outpatient Diabetes medications: Humulin R U500 130 units with breakfast, 100 units with lunch and supper, Metformin 1000 mg BID, Glipizide XL 5 mg daily, Bydureon 2 mg Qweek  ?Current orders for Inpatient glycemic control: Levemir 60 units BID, Novolog 0-20 units TID with meals, Novolog 0-5 units QHS, Novolog 10 units TID with meals ? ?Inpatient Diabetes Program Recommendations:   ? ?-   Consider increasing Semglee to 70 units BID and meal coverage to Novolog 12 units TID with meals. ? ?Thanks, ?Tama Headings RN, MSN, BC-ADM ?Inpatient Diabetes Coordinator ?Team Pager [phone] (8a-5p) ?

## 2021-04-09 NOTE — TOC Transition Note (Signed)
Transition of Care (TOC) - CM/SW Discharge Note ? ? ?Patient Details  ?Name: Aaron Potter ?MRN: [id number] ?Date of Birth: [birth year] ? ?Transition of Care (TOC) CM/SW Contact:  ?Leitha Bleak, RN ?Phone Number: ?04/09/2021, 11:49 AM ? ? ?Clinical Narrative:   Patient discharging home. Not home health agencies available to take referral. Outpatient PT ordered. TOC spoke with patient he did gget bathroom scales and thinks he can do exercises that PT gave him here at [facility]. TOC encouraged patient to follow up with PCP and make a decision on PT.  ? ? ?Final next level of care: Home/Self Care ?Barriers to Discharge: No Home Care Agency will accept this patient ? ? ?Patient Goals and CMS Choice ?Patient states their goals for this hospitalization and ongoing recovery are:: go home ?CMS Medicare.gov Compare Post Acute Care list provided to:: Patient ?Choice offered to / list presented to : Patient ? ?Discharge Placement ?  ?          ?Patient and family notified of of transfer: 04/09/21 ? ?Discharge Plan and Services ?In-house Referral: Clinical Social Work ?  ?            ?  ? Readmission Risk Interventions ? ?  04/09/2021  ? 11:48 AM  ?Readmission Risk Prevention Plan  ?Transportation Screening Complete  ?Home Care Screening Complete  ?Medication Review (RN CM) Complete  ? ? ? ? ? ?

## 2021-04-09 NOTE — Progress Notes (Signed)
? ?Progress Note ? ?Patient Name: Aaron Potter ?Date of Encounter: 04/09/2021 ? ?[facility] Cardiologist: Tessa Lerner, DO  ? ?Subjective  ? ?Pt says breathing is at baseline   Eager to go home  ? ?Inpatient Medications  ?  ?Scheduled Meds: ? albuterol  2.5 mg Nebulization TID  ? allopurinol  300 mg Oral Daily  ? enoxaparin (LOVENOX) injection  100 mg Subcutaneous Q24H  ? insulin aspart  0-20 Units Subcutaneous TID WC  ? insulin aspart  0-5 Units Subcutaneous QHS  ? insulin aspart  10 Units Subcutaneous TID WC  ? insulin detemir  60 Units Subcutaneous BID  ? multivitamin with minerals  1 tablet Oral Daily  ? pantoprazole  40 mg Oral Daily  ? potassium chloride  40 mEq Oral BID  ? pravastatin  40 mg Oral Daily  ? sodium chloride flush  3 mL Intravenous Q12H  ? spironolactone  25 mg Oral Daily  ? verapamil  120 mg Oral Daily  ? ?Continuous Infusions: ? sodium chloride    ? furosemide (LASIX) 200 mg in dextrose 5% 100 mL (2mg /mL) infusion 10 mg/hr (04/08/21 1700)  ? ?PRN Meds: ?sodium chloride, acetaminophen **OR** acetaminophen, albuterol, ALPRAZolam, diclofenac Sodium, ondansetron **OR** ondansetron (ZOFRAN) IV, oxyCODONE-acetaminophen, sodium chloride flush  ? ?Vital Signs  ?  ?Vitals:  ? 04/08/21 2113 04/09/21 0500 04/09/21 0521 04/09/21 0800  ?BP: (!) 147/58  (!) 131/53   ?Pulse: 75  73   ?Resp: 20  14   ?Temp: 98.3 ?F (36.8 ?C)  98.5 ?F (36.9 ?C)   ?TempSrc: Oral  Oral   ?SpO2: 95%  95% 91%  ?Weight:  (!) 208.7 kg    ?Height:      ? ? ?Intake/Output Summary (Last 24 hours) at 04/09/2021 1036 ?Last data filed at 04/09/2021 0518 ?Gross per 24 hour  ?Intake 240 ml  ?Output 1200 ml  ?Net -960 ml  ? ?I/O   ? Complete   19.9 L negative     ? ?  04/09/2021  ?  5:00 AM 04/08/2021  ?  5:00 AM 04/07/2021  ?  5:00 AM  ?Last 3 Weights  ?Weight (lbs) 460 lb 452 lb 4.8 oz 453 lb 14.8 oz  ?Weight (kg) 208.655 kg 205.162 kg 205.9 kg  ?   ? ?Telemetry  ?  ?NSR - Personally Reviewed ? ?ECG  ?  ?No new tracing - Personally  Reviewed ? ?Physical Exam  ? ?GEN: Morbidly obese, NAD ?Neck: JVD difficult to assess   Neck is full ?Cardiac: Distant, regular, no murmurs apprecaited ?Respiratory: Clear to auscultation bilaterally. ?GI: Obese, soft, NTTP ?MS: 1-2+ pitting edema to mid-shin; chronic venous stasis changes ?Neuro:  Nonfocal  ?Psych: Normal affect  ? ?Labs  ?  ?High Sensitivity Troponin:  No results for input(s): TROPONINIHS in the last 720 hours.   ?Chemistry ?Recent Labs  ?Lab 04/06/21 ?06/06/21 04/06/21 ?1756 04/07/21 ?0530 04/07/21 ?1217 04/08/21 ?0618 04/09/21 ?06/09/21  ?NA 136  --  134*  --  137 137  ?K 3.4*  --  3.2*  --  3.6 3.4*  ?CL 82*  --  82*  --  86* 88*  ?CO2 40*  --  39*  --  37* 36*  ?GLUCOSE 334*   < > 326* 432* 262* 273*  ?BUN 24*  --  29*  --  28* 27*  ?CREATININE 1.01  --  1.07  --  1.16 1.04  ?CALCIUM 9.5  --  9.1  --  9.3 9.4  ?  MG 1.7  --   --   --  2.1 2.2  ?PROT  --   --  7.1  --  7.1  --   ?ALBUMIN 3.8  --  3.6  --  3.6  --   ?AST  --   --  30  --  34  --   ?ALT  --   --  22  --  23  --   ?ALKPHOS  --   --  57  --  62  --   ?BILITOT  --   --  1.3*  --  1.2  --   ?GFRNONAA >60  --  >60  --  >60 >60  ?ANIONGAP 14  --  13  --  14 13  ? < > = values in this interval not displayed.  ?  ?Lipids No results for input(s): CHOL, TRIG, HDL, LABVLDL, LDLCALC, CHOLHDL in the last 168 hours.  ?HematologyNo results for input(s): WBC, RBC, HGB, HCT, MCV, MCH, MCHC, RDW, PLT in the last 168 hours. ?Thyroid No results for input(s): TSH, FREET4 in the last 168 hours.  ?BNPNo results for input(s): BNP, PROBNP in the last 168 hours.  ?DDimer No results for input(s): DDIMER in the last 168 hours.  ? ?Radiology  ?  ?No results found. ? ?Cardiac Studies  ? ?TTE 04/05/21: ?. Very limited visualization even with contrast. Probably normal LVEF  ?55-60%. . Left ventricular ejection fraction, by estimation, is 55 to 60%.  ?The left ventricle has normal function. Left ventricular endocardial  ?border not optimally defined to  ?evaluate regional  wall motion. not well visualized left ventricular  ?hypertrophy. Left ventricular diastolic parameters are indeterminate.  ? 2. Right ventricular systolic function was not well visualized. The right  ?ventricular size is not well visualized.  ? 3. The mitral valve was not well visualized. not well visualized mitral  ?valve regurgitation. note well visualized mitral stenosis.  ? 4. Tricuspid valve regurgitation not well visualized. note well  ?visualized tricuspid stenosis.  ? 5. The aortic valve was not well visualized. Aortic valve regurgitation  ?is not visualized.  ? 6. Limited echo with contrast to assess LV function. Very limited  ?visualization even with contrast given body habitus.  ? ?Patient Profile  ?   ?61 y.o. male with a history of HFpEF, HTN, HLD, IDDM, COPD, OSA and morbid obesity who presented with worsening SOB, LE edema and weight gain found to have acute on chronic diastolic HF exacerbation for which Cardiology has been consulted.  ? ?Assessment & Plan  ?  ?#Acute on Chronic HF Exacerbation: ?TTE very limited due to body habitus and poor visualization but appears to be 50-60%. Presented with worsening SOB, LE edema and weight gain consistent with acute HF exacerbation. Has been diuresing with lasix gtt. If wt accurate, he has gone from 491lbs>452.3lbs.  Now back to 160   ?Urine in hat is darker   BUN CR relatively unchanged  Unclear baseline wt which was 458lb in August and 439lbs in September. Suspect dietary indiscretions are primary driver of fluid overload.   ? ?REcomm:    can transition to oral   ?Can start Bumex 1 mg   Follow as outpt  Could double ?Continue  spironolactone 25mg  daily ? ?I think he will most likely come back   Needs to observe low Na diet        ?#HTN: ?Controlled ?-Continue verapamil 120mg  daily ? ?#HLD: ?-Continue  prava 40mg  daily ? ?#Morbid  Obesity with BMI 65: ?-Needs continued lifestyle modifications   Discussed carbs, sugars  ? ?   ? ?For questions or updates, please  contact [facility] ?Please consult [email or web address] for contact info under  ? ?  ?   ?Signed, ?Dietrich PatesPaula Taliya Mcclard, MD  ?04/09/2021, 10:36 AM    ?

## 2021-04-21 ENCOUNTER — Encounter: Payer: Self-pay | Admitting: Cardiology

## 2021-04-21 ENCOUNTER — Encounter: Payer: Self-pay | Admitting: *Deleted

## 2021-04-21 ENCOUNTER — Ambulatory Visit: Payer: Medicaid Other | Admitting: Cardiology

## 2021-04-21 VITALS — BP 105/44 | HR 81 | Temp 97.7°F | Resp 16 | Ht 71.0 in | Wt >= 6400 oz

## 2021-04-21 DIAGNOSIS — E782 Mixed hyperlipidemia: Secondary | ICD-10-CM

## 2021-04-21 DIAGNOSIS — I5031 Acute diastolic (congestive) heart failure: Secondary | ICD-10-CM

## 2021-04-21 DIAGNOSIS — G4733 Obstructive sleep apnea (adult) (pediatric): Secondary | ICD-10-CM

## 2021-04-21 DIAGNOSIS — E119 Type 2 diabetes mellitus without complications: Secondary | ICD-10-CM

## 2021-04-21 DIAGNOSIS — Z8249 Family history of ischemic heart disease and other diseases of the circulatory system: Secondary | ICD-10-CM

## 2021-04-21 MED ORDER — DAPAGLIFLOZIN PROPANEDIOL 10 MG PO TABS: 10.0000 mg | ORAL_TABLET | Freq: Every day | ORAL | 0 refills | Status: DC

## 2021-04-21 NOTE — Progress Notes (Signed)
? ?Date:  04/21/2021  ? ?ID:  Aaron Potter, DOB 06-Sep-1960, MRN [id number] ? ?PCP:  Suzan Slick, MD  ?Cardiologist: Dr. Hubert Azure [facility]) and [place], [place], [facility] (established care 08/12/2020) ? ?Date: 04/21/21 ?Last Office Visit: 03/23/2021 ? ?Chief Complaint  ?Patient presents with  ? Congestive Heart Failure  ? Hospitalization Follow-up  ? ? ?HPI  ?Aaron Potter is a 61 y.o. male whose past medical history and cardiovascular risk factors include: HFpEF, hypertension, Hypertension, hyperlipidemia, insulin-dependent diabetes mellitus type 2, OSA on BiPAP, family history of heart disease, morbid obesity (Body mass index is 60.53 kg/m?.). ? ?This patient is accompanied in the office by his  sister, [name] . Jones Viviani provides verbal consent with regards to having her present during today's encounter and also provides collateral history.  ? ?During his last office visit in March 2023 patient had gained approximately 17 pounds due to dietary indiscretion.  At that time he was on 40 mg Lasix p.o. daily.  I suspected that the bioavailability of Lasix may also be contributing to his ineffective diuresis and therefore he was transitioned to torsemide.  Shortly thereafter he was hospitalized for heart failure exacerbation and was seen by [facility] and now referred back to the practice postdischarge. ? ?Patient was discharged from the hospital on April 09, 2021 after being diuresed 22 pounds.  Clinically he looks much better, his legs have significantly improved with regards to lower extremity swelling and patient appears to be more motivated to take care of himself. ? ?Patient's been keeping a log of his blood pressures and his weight.  At the time of discharge his home weighing scale noted to weight of 446 pounds and this morning was 441 pounds.  He is walking at least 1-2 blocks per day.  He has an upcoming appointment with sleep medicine in the next week for repeat sleep study and possible up titration  of his device. ? ?He denies anginal discomfort.  Shortness of breath and lower extremity swelling has improved but still present.  He denies orthopnea or paroxysmal nocturnal dyspnea. ? ?FUNCTIONAL STATUS: ?No family history of premature coronary disease or sudden cardiac death. ?  ? ?ALLERGIES: ?Allergies  ?Allergen Reactions  ? Biaxin [Clarithromycin] Anaphylaxis and Shortness Of Breath  ? Ace Inhibitors   ?  D/c ACEi 05/20/2019 > changed to clonidine for pain control component  ? ?Although even in retrospect it may not be clear the ACEi contributed to the pt's symptoms, adding them back at this point or in the future would risk confusion in interpretation of non-specific respiratory symptoms to which this patient is prone ie Better not to muddy the waters here.    ? ? ?MEDICATION LIST PRIOR TO VISIT: ?Current Meds  ?Medication Sig  ? albuterol (PROVENTIL) (2.5 MG/3ML) 0.083% nebulizer solution Take 3 mLs (2.5 mg total) by nebulization every 6 (six) hours as needed for wheezing or shortness of breath.  ? allopurinol (ZYLOPRIM) 300 MG tablet Take 300 mg by mouth daily.    ? bumetanide (BUMEX) 1 MG tablet Take 1 tablet (1 mg total) by mouth daily.  ? BYDUREON BCISE 2 MG/0.85ML AUIJ INJECT INTO THE SKIN ONCE A WEEK. (Patient taking differently: Inject 2 mg into the skin once a week.)  ? dapagliflozin propanediol (FARXIGA) 10 MG TABS tablet Take 1 tablet (10 mg total) by mouth daily before breakfast.  ? diclofenac Sodium (VOLTAREN) 1 % GEL Apply 2 g topically 4 (four) times daily as needed (pain).  ?  glipiZIDE (GLUCOTROL XL) 5 MG 24 hr tablet TAKE 1 TABLET ONCE DAILY WITH BREAKFAST. (Patient taking differently: Take 5 mg by mouth daily with breakfast.)  ? insulin regular human CONCENTRATED (HUMULIN R U-500 KWIKPEN) 500 UNIT/ML KwikPen Inject 100-130 Units into the skin 3 (three) times daily with meals.  ? metFORMIN (GLUCOPHAGE) 500 MG tablet TAKE 2 TABLETS BY MOUTH TWICE DAILY. (Patient taking differently: Take 1,000  mg by mouth 2 (two) times daily with a meal.)  ? Multiple Vitamin (MULTIVITAMIN WITH MINERALS) TABS tablet Take 1 tablet by mouth daily.  ? oxyCODONE-acetaminophen (PERCOCET) 7.5-325 MG tablet Take 1 tablet by mouth every 6 (six) hours as needed for severe pain.  ? pantoprazole (PROTONIX) 40 MG tablet TAKE 1 TABLET DAILY, 30 MINUTES BEFORE BREAKFAST ` (Patient taking differently: Take 40 mg by mouth daily.)  ? potassium chloride SA (KLOR-CON M) 20 MEQ tablet Take 1 tablet (20 mEq total) by mouth daily.  ? pravastatin (PRAVACHOL) 40 MG tablet Take 40 mg by mouth daily.  ? spironolactone (ALDACTONE) 25 MG tablet Take 1 tablet (25 mg total) by mouth daily.  ? verapamil (CALAN-SR) 120 MG CR tablet Take 1 tablet (120 mg total) by mouth daily.  ? Vitamin D, Ergocalciferol, (DRISDOL) 1.25 MG (50000 UNIT) CAPS capsule Take 1 capsule (50,000 Units total) by mouth once a week.  ?  ? ?PAST MEDICAL HISTORY: ?Past Medical History:  ?Diagnosis Date  ? Allergic rhinitis, unspecified   ? Allergy   ? Anxiety disorder, unspecified   ? Asthma   ? Back pain   ? Balanitis   ? Cellulitis   ? COPD (chronic obstructive pulmonary disease) (HCC)   ? Diabetes mellitus without complication (HCC)   ? Essential (primary) hypertension   ? Gastro-esophageal reflux disease with esophagitis   ? Gout   ? Gout, unspecified   ? Hypertension   ? Lichen sclerosus   ? Mixed hyperlipidemia   ? Morbid (severe) obesity with alveolar hypoventilation (HCC)   ? Obesity, morbid (more than 100 lbs over ideal weight or BMI > 40) (HCC)   ? Obesity, unspecified   ? Polyosteoarthritis, unspecified   ? Sleep apnea   ? Sleep apnea, unspecified   ? CPAP  ? Type 2 diabetes mellitus with hyperglycemia (HCC)   ? Urticaria   ? 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 ? ?PAST SURGICAL HISTORY: ?Past Surgical History:  ?Procedure Laterality Date  ? CHOLECYSTECTOMY    ? ERCP  1998  ? [facility], CBD stone s/p cholecystectomy  ? FOOT SURGERY Right  04/2017  ? ? ?FAMILY HISTORY: ?The patient family history includes Cancer in his mother; Diabetes in his father and mother; Heart attack in his father; Urticaria in his sister. ? ?SOCIAL HISTORY:  ?The patient  reports that he has never smoked. He has never used smokeless tobacco. He reports that he does not drink alcohol and does not use drugs. ? ?REVIEW OF SYSTEMS: ?Review of Systems  ?Constitutional: Positive for weight loss. Negative for chills and fever.  ?HENT:  Negative for hoarse voice and nosebleeds.   ?Eyes:  Negative for discharge, double vision and pain.  ?Cardiovascular:  Positive for dyspnea on exertion (better) and leg swelling (much improved.). Negative for chest pain, claudication, near-syncope, orthopnea, palpitations, paroxysmal nocturnal dyspnea and syncope.  ?Respiratory:  Positive for shortness of breath. Negative for hemoptysis.   ?Musculoskeletal:  Negative for muscle cramps and myalgias.  ?Gastrointestinal:  Negative for abdominal pain, constipation,  diarrhea, hematemesis, hematochezia, melena, nausea and vomiting.  ?Neurological:  Negative for dizziness and light-headedness.  ? ?PHYSICAL EXAM: ? ?  04/21/2021  ?  8:57 AM 04/09/2021  ?  5:21 AM 04/09/2021  ?  5:00 AM  ?Vitals with BMI  ?Height 5\' 11"     ?Weight 434 lbs  460 lbs  ?BMI 60.56  66.98  ?Systolic 105 131   ?Diastolic 44 53   ?Pulse 81 73   ? ? ?CONSTITUTIONAL: Appears older than stated age, hemodynamically stable, no acute distress. ?SKIN: Skin is warm and dry. No rash noted. No cyanosis. No pallor. No jaundice ?HEAD: Normocephalic and atraumatic.  ?EYES: No scleral icterus ?MOUTH/THROAT: Moist oral membranes.  ?NECK: Unable to evaluate JVP due to short neck stature and significant adipose tissue, do not appreciate carotid bruits. ?LYMPHATIC: No visible cervical adenopathy.  ?CHEST Normal respiratory effort. No intercostal retractions  ?LUNGS: Decreased breath sounds bilaterally.  No stridor. No wheezes. No rales.  ?CARDIOVASCULAR:  Distant heart sound, faint S1-S2, no murmurs rubs or gallops appreciated. ?ABDOMINAL: Morbidly obese, soft, nontender, nondistended, positive bowel sounds in all 4 quadrants. ?EXTREMITIES: trace bilateral p

## 2021-04-25 ENCOUNTER — Encounter: Payer: Medicaid Other | Admitting: Pulmonary Disease

## 2021-04-26 ENCOUNTER — Other Ambulatory Visit: Payer: Self-pay

## 2021-04-26 ENCOUNTER — Telehealth: Payer: Self-pay | Admitting: Gastroenterology

## 2021-04-26 DIAGNOSIS — I5031 Acute diastolic (congestive) heart failure: Secondary | ICD-10-CM

## 2021-04-26 NOTE — Telephone Encounter (Signed)
Patient called where he was referred to have his procedure done so he could cancel and he said they told him to call here  ?

## 2021-04-26 NOTE — Telephone Encounter (Signed)
Routing to [employer] who last saw this patient.  ?

## 2021-04-26 NOTE — Telephone Encounter (Signed)
Called pt. He states he has so much going on he doesn't want to proceed with referral to [facility] for procedures at this time. ?

## 2021-04-28 NOTE — Telephone Encounter (Signed)
Noted  

## 2021-04-29 ENCOUNTER — Other Ambulatory Visit: Payer: Self-pay

## 2021-04-29 DIAGNOSIS — I5031 Acute diastolic (congestive) heart failure: Secondary | ICD-10-CM

## 2021-04-29 MED ORDER — DAPAGLIFLOZIN PROPANEDIOL 10 MG PO TABS: 10.0000 mg | ORAL_TABLET | Freq: Every day | ORAL | 0 refills | Status: DC

## 2021-04-30 ENCOUNTER — Other Ambulatory Visit: Payer: Self-pay | Admitting: "Endocrinology

## 2021-04-30 DIAGNOSIS — E1165 Type 2 diabetes mellitus with hyperglycemia: Secondary | ICD-10-CM

## 2021-05-10 ENCOUNTER — Other Ambulatory Visit: Payer: Self-pay | Admitting: Cardiology

## 2021-05-11 ENCOUNTER — Telehealth: Payer: Self-pay

## 2021-05-11 LAB — T4, FREE: Free T4: 0.91 ng/dL (ref 0.82–1.77)

## 2021-05-11 LAB — COMPREHENSIVE METABOLIC PANEL
ALT: 21 IU/L (ref 0–44)
AST: 28 IU/L (ref 0–40)
Albumin/Globulin Ratio: 1.4 (ref 1.2–2.2)
Albumin: 4 g/dL (ref 3.8–4.9)
Alkaline Phosphatase: 71 IU/L (ref 44–121)
BUN/Creatinine Ratio: 17 (ref 9–20)
BUN: 17 mg/dL (ref 6–24)
Bilirubin Total: 0.4 mg/dL (ref 0.0–1.2)
CO2: 27 mmol/L (ref 20–29)
Calcium: 9.9 mg/dL (ref 8.7–10.2)
Chloride: 102 mmol/L (ref 96–106)
Creatinine, Ser: 1 mg/dL (ref 0.76–1.27)
Globulin, Total: 2.8 g/dL (ref 1.5–4.5)
Glucose: 93 mg/dL (ref 70–99)
Potassium: 6 mmol/L — ABNORMAL HIGH (ref 3.5–5.2)
Sodium: 142 mmol/L (ref 134–144)
Total Protein: 6.8 g/dL (ref 6.0–8.5)
eGFR: 87 mL/min/{1.73_m2} (ref >59)

## 2021-05-11 LAB — LIPID PANEL
Chol/HDL Ratio: 3.4 ratio (ref 0.0–5.0)
Cholesterol, Total: 133 mg/dL (ref 100–199)
HDL: 39 mg/dL — ABNORMAL LOW (ref >39)
LDL Chol Calc (NIH): 75 mg/dL (ref 0–99)
Triglycerides: 100 mg/dL (ref 0–149)
VLDL Cholesterol Cal: 19 mg/dL (ref 5–40)

## 2021-05-11 LAB — TSH: TSH: 7 u[IU]/mL — ABNORMAL HIGH (ref 0.450–4.500)

## 2021-05-11 LAB — MAGNESIUM: Magnesium: 1.9 mg/dL (ref 1.6–2.3)

## 2021-05-11 NOTE — Telephone Encounter (Signed)
-----   Message from Roma Kayser, MD sent at 05/11/2021  2:02 PM EDT ----- ?Aaron Potter, ?Advise him to stop his Potassium until next visit. Thanks. ? ?

## 2021-05-11 NOTE — Telephone Encounter (Signed)
Called pt, advised him to stop taking potassium until his next visit per Dr.Nida's orders. Pt voiced understanding. ?

## 2021-05-13 ENCOUNTER — Ambulatory Visit: Payer: Medicaid Other | Admitting: "Endocrinology

## 2021-05-18 ENCOUNTER — Telehealth: Payer: Self-pay

## 2021-05-18 ENCOUNTER — Encounter: Payer: Self-pay | Admitting: "Endocrinology

## 2021-05-18 ENCOUNTER — Ambulatory Visit (INDEPENDENT_AMBULATORY_CARE_PROVIDER_SITE_OTHER): Payer: Medicaid Other | Admitting: "Endocrinology

## 2021-05-18 VITALS — HR 84 | Ht 71.0 in | Wt >= 6400 oz

## 2021-05-18 DIAGNOSIS — Z794 Long term (current) use of insulin: Secondary | ICD-10-CM

## 2021-05-18 DIAGNOSIS — E782 Mixed hyperlipidemia: Secondary | ICD-10-CM

## 2021-05-18 DIAGNOSIS — I10 Essential (primary) hypertension: Secondary | ICD-10-CM | POA: Diagnosis not present

## 2021-05-18 DIAGNOSIS — E1169 Type 2 diabetes mellitus with other specified complication: Secondary | ICD-10-CM | POA: Diagnosis not present

## 2021-05-18 DIAGNOSIS — E1165 Type 2 diabetes mellitus with hyperglycemia: Secondary | ICD-10-CM

## 2021-05-18 MED ORDER — HUMULIN R U-500 KWIKPEN 500 UNIT/ML ~~LOC~~ SOPN: 80.0000 [IU] | PEN_INJECTOR | Freq: Three times a day (TID) | SUBCUTANEOUS | 2 refills | Status: DC

## 2021-05-18 NOTE — Telephone Encounter (Signed)
Discontinue Farxiga. ?Have him stop olmesartan and transition to Entresto 49/51 mg p.o. twice daily.  ?Please order labs in 1 week to evaluate kidney function electrolytes and BNP. ? ?Dr. Terri Skains

## 2021-05-18 NOTE — Patient Instructions (Signed)

## 2021-05-18 NOTE — Telephone Encounter (Signed)
Patient called and stated that you recently prescribed [facility]), stating that it is causing constipation, nausea, back pain, and a yeast infection in his "male parts". He wants you to find something else because either way he is "going to STOP taking it".  ?

## 2021-05-18 NOTE — Progress Notes (Signed)
05/18/2021 ? ?Endocrinology follow-up note ? ?Subjective:  ? ? Patient ID: Aaron Potter, male    DOB: [birth year], PCP Leeanne Rio, MD ? ? ?Past Medical History:  ?Diagnosis Date  ? Allergic rhinitis, unspecified   ? Allergy   ? Anxiety disorder, unspecified   ? Asthma   ? Back pain   ? Balanitis   ? Cellulitis   ? COPD (chronic obstructive pulmonary disease) ([place])   ? Diabetes mellitus without complication ([place])   ? Essential (primary) hypertension   ? Gastro-esophageal reflux disease with esophagitis   ? Gout   ? Gout, unspecified   ? Hypertension   ? Lichen sclerosus   ? Mixed hyperlipidemia   ? Morbid (severe) obesity with alveolar hypoventilation ([facility])   ? Obesity, morbid (more than 100 lbs over ideal weight or BMI > 40) (H[facility])   ? Obesity, unspecified   ? Polyosteoarthritis, unspecified   ? Sleep apnea   ? Sleep apnea, unspecified   ? CPAP  ? Type 2 diabetes mellitus with hyperglycemia (H[facility])   ? Urticaria   ? 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 ?Past Surgical History:  ?Procedure Laterality Date  ? CHOLECYSTECTOMY    ? ERCP  1998  ? [facility] [place] s/p cholecystectomy  ? FOOT SURGERY Right 04/2017  ? ?Social History  ? ?Socioeconomic History  ? Marital status: Single  ?  Spouse name: Not on file  ? Number of children: 0  ? Years of education: Not on file  ? Highest education level: Not on file  ?Occupational History  ? Not on file  ?Tobacco Use  ? Smoking status: Never  ? Smokeless tobacco: Never  ?Vaping Use  ? Vaping Use: Never used  ?Substance and Sexual Activity  ? Alcohol use: No  ?  Alcohol/week: 0.0 standard drinks  ? Drug use: No  ? Sexual activity: Yes  ?Other Topics Concern  ? Not on file  ?Social History Narrative  ? Not on file  ? ?Social Determinants of Health  ? ?Financial Resource Strain: Not on file  ?Food Insecurity: Not on file  ?Transportation Needs: Not on file  ?Physical Activity: Not on file  ?Stress: Not on file  ?Social Connections: Not on  file  ? ?Outpatient Encounter Medications as of 05/18/2021  ?Medication Sig  ? ACCU-CHEK AVIVA PLUS test strip USE TO CHECK BLOOD SUGAR 4 TIMES DAILY.  ? Accu-Chek FastClix Lancets MISC USE TO CHECK BLOOD SUGAR FOUR TIMES A DAY.  ? albuterol (PROVENTIL) (2.5 MG/3ML) 0.083% nebulizer solution Take 3 mLs (2.5 mg total) by nebulization every 6 (six) hours as needed for wheezing or shortness of breath.  ? allopurinol (ZYLOPRIM) 300 MG tablet Take 300 mg by mouth daily.    ? ALPRAZolam (XANAX) 0.5 MG tablet Take 1 tablet by mouth 2 (two) times daily as needed for anxiety.  ? ALPRAZolam (XANAX) 0.5 MG tablet Take by mouth.  ? Blood Glucose Monitoring Suppl (ACCU-CHEK GUIDE) w/Device KIT 1 Piece by Does not apply route as directed.  ? bumetanide (BUMEX) 1 MG tablet Take 1 tablet (1 mg total) by mouth daily.  ? BYDUREON BCISE 2 MG/0.85ML AUIJ INJECT INTO THE SKIN ONCE A WEEK.  ? cetirizine (ZYRTEC) 10 MG tablet Take 10 mg by mouth daily.  ? dapagliflozin propanediol (FARXIGA) 10 MG TABS tablet Take 1 tablet (10 mg total) by mouth daily before breakfast.  ? diclofenac Sodium (VOLTAREN) 1 % GEL Apply 2 g  topically 4 (four) times daily as needed (pain).  ? GLOBAL EASE INJECT PEN NEEDLES 31G X 8 MM MISC USE 4 TIMES DAILY.  ? insulin regular human CONCENTRATED (HUMULIN R U-500 KWIKPEN) 500 UNIT/ML KwikPen Inject 80-110 Units into the skin 3 (three) times daily with meals.  ? metFORMIN (GLUCOPHAGE) 500 MG tablet TAKE 2 TABLETS BY MOUTH TWICE DAILY.  ? Multiple Vitamin (MULTIVITAMIN WITH MINERALS) TABS tablet Take 1 tablet by mouth daily.  ? olmesartan (BENICAR) 20 MG tablet Take 1 tablet (20 mg total) by mouth daily.  ? oxyCODONE-acetaminophen (PERCOCET) 7.5-325 MG tablet Take 1 tablet by mouth every 6 (six) hours as needed for severe pain.  ? pantoprazole (PROTONIX) 40 MG tablet TAKE 1 TABLET DAILY, 30 MINUTES BEFORE BREAKFAST ` (Patient taking differently: Take 40 mg by mouth daily.)  ? pravastatin (PRAVACHOL) 40 MG tablet Take  40 mg by mouth daily.  ? spironolactone (ALDACTONE) 25 MG tablet Take 1 tablet (25 mg total) by mouth daily.  ? verapamil (CALAN-SR) 120 MG CR tablet Take 1 tablet (120 mg total) by mouth daily.  ? Vitamin D, Ergocalciferol, (DRISDOL) 1.25 MG (50000 UNIT) CAPS capsule Take 1 capsule (50,000 Units total) by mouth once a week.  ? [DISCONTINUED] glipiZIDE (GLUCOTROL XL) 5 MG 24 hr tablet TAKE 1 TABLET ONCE DAILY WITH BREAKFAST. (Patient taking differently: Take 5 mg by mouth daily with breakfast.)  ? [DISCONTINUED] HUMULIN R U-500 KWIKPEN 500 UNIT/ML KwikPen INJECT 100-130 UNITS SUB-Q 3 TIMES A DAY WITH MEALS.  ? [DISCONTINUED] potassium chloride SA (KLOR-CON M) 20 MEQ tablet Take 1 tablet (20 mEq total) by mouth daily.  ? ?No facility-administered encounter medications on file as of 05/18/2021.  ? ?ALLERGIES: ?Allergies  ?Allergen Reactions  ? Biaxin [Clarithromycin] Anaphylaxis and Shortness Of Breath  ? Ace Inhibitors   ?  D/c ACEi 05/20/2019 > changed to clonidine for pain control component  ? ?Although even in retrospect it may not be clear the ACEi contributed to the pt's symptoms, adding them back at this point or in the future would risk confusion in interpretation of non-specific respiratory symptoms to which this patient is prone ie Better not to muddy the waters here.    ? ?VACCINATION STATUS: ?Immunization History  ?Administered Date(s) Administered  ? Influenza-Unspecified 10/18/2010, 10/18/2011, 10/18/2012, 12/11/2013, 09/11/2014, 10/13/2015, 09/06/2018  ? Moderna Sars-Covid-2 Vaccination 04/01/2019, 04/29/2019  ? PNEUMOCOCCAL CONJUGATE-20 05/08/2020  ? Tdap 04/13/2016  ? ? ?Diabetes ?He presents for his follow-up diabetic visit. He has type 2 diabetes mellitus. Onset time: He was diagnosed at approximate age of 40 years. His disease course has been improving. There are no hypoglycemic associated symptoms. Pertinent negatives for hypoglycemia include no confusion, headaches, pallor or seizures. Associated  symptoms include polydipsia and polyuria. Pertinent negatives for diabetes include no chest pain, no fatigue, no polyphagia and no weakness. There are no hypoglycemic complications. Symptoms are improving. Diabetic complications include PVD. Risk factors for coronary artery disease include dyslipidemia, diabetes mellitus, family history, male sex, obesity and sedentary lifestyle. Current diabetic treatment includes intensive insulin program. He is compliant with treatment most of the time. His weight is fluctuating minimally. He is following a generally unhealthy diet. When asked about meal planning, he reported none. He has had a previous visit with a dietitian. He never participates in exercise. His home blood glucose trend is decreasing steadily. His breakfast blood glucose range is generally 130-140 mg/dl. His lunch blood glucose range is generally 140-180 mg/dl. His dinner blood glucose range is generally  140-180 mg/dl. His bedtime blood glucose range is generally 140-180 mg/dl. His overall blood glucose range is 140-180 mg/dl. (He presents with continued improvement in his glycemic profile, point-of-care A1c of 7.4% improving from 8.1% during his last visit.   ? ? ?) An ACE inhibitor/angiotensin II receptor blocker is being taken.  ?Hyperlipidemia ?This is a chronic problem. The current episode started more than 1 year ago. The problem is uncontrolled. Exacerbating diseases include diabetes and obesity. Associated symptoms include shortness of breath. Pertinent negatives include no chest pain or myalgias. Current antihyperlipidemic treatment includes statins. Risk factors for coronary artery disease include diabetes mellitus, dyslipidemia, hypertension, male sex, obesity and a sedentary lifestyle.  ?Hypertension ?This is a chronic problem. The current episode started more than 1 year ago. The problem is uncontrolled. Associated symptoms include shortness of breath. Pertinent negatives include no chest pain,  headaches, neck pain or palpitations. Risk factors for coronary artery disease include diabetes mellitus, dyslipidemia, obesity, male gender, sedentary lifestyle and family history. Past treatments include

## 2021-05-19 NOTE — Telephone Encounter (Signed)
I called patient, he wants you to call him. He is very confused.

## 2021-05-21 ENCOUNTER — Ambulatory Visit: Payer: Medicaid Other | Attending: Adult Health | Admitting: Pulmonary Disease

## 2021-05-21 DIAGNOSIS — G4733 Obstructive sleep apnea (adult) (pediatric): Secondary | ICD-10-CM | POA: Insufficient documentation

## 2021-05-26 ENCOUNTER — Other Ambulatory Visit: Payer: Self-pay

## 2021-05-26 DIAGNOSIS — G4733 Obstructive sleep apnea (adult) (pediatric): Secondary | ICD-10-CM | POA: Diagnosis not present

## 2021-05-26 MED ORDER — SACUBITRIL-VALSARTAN 49-51 MG PO TABS: 1.0000 | ORAL_TABLET | Freq: Two times a day (BID) | ORAL | 0 refills | Status: DC

## 2021-05-26 NOTE — Telephone Encounter (Signed)
Can you follow up?   ST

## 2021-05-26 NOTE — Procedures (Signed)
Patient Name: Aaron Potter, Aaron Potter Date: 05/21/2021 Gender: Male D.O.B: 02/10/1960 Age (years): 40 Referring Provider: Babette Relic Parrett Height (inches): 71 Interpreting Physician: Cyril Mourning MD, ABSM Weight (lbs): 441 RPSGT: Peak, Robert BMI: 62 MRN: [id number] Neck Size: 26.50 <br> <br> CLINICAL INFORMATION The patient is referred for a PAP titration to treat sleep apnea.  Baseline study was not available  SLEEP STUDY TECHNIQUE As per the AASM Manual for the Scoring of Sleep and Associated Events v2.3 (April 2016) with a hypopnea requiring 4% desaturations.  The channels recorded and monitored were frontal, central and occipital EEG, electrooculogram (EOG), submentalis EMG (chin), nasal and oral airflow, thoracic and abdominal wall motion, anterior tibialis EMG, snore microphone, electrocardiogram, and pulse oximetry. Bilevel positive airway pressure (BPAP) was initiated at the beginning of the study and titrated to treat sleep-disordered breathing.  MEDICATIONS Medications self-administered by patient taken the night of the study : N/A  RESPIRATORY PARAMETERS Optimal IPAP Pressure (cm): 17 AHI at Optimal Pressure (/hr) 10.9 Optimal EPAP Pressure (cm): 13   Overall Minimal O2 (%): 0.00 Minimal O2 at Optimal Pressure (%): 84.0 SLEEP ARCHITECTURE Start Time: 9:35:26 PM Stop Time: 4:59:04 AM Total Time (min): 443.6 Total Sleep Time (min): 332.5 Sleep Latency (min): 19.6 Sleep Efficiency (%): 74.9 REM Latency (min): 150.5 WASO (min): 91.6 Stage N1 (%): 10.08 Stage N2 (%): 68.57 Stage N3 (%): 0.15 Stage R (%): 21.2 Supine (%): 1.05 Arousal Index (/hr): 17.0     CARDIAC DATA The 2 lead EKG demonstrated sinus rhythm. The mean heart rate was 48.54 beats per minute. Other EKG findings include: None.   LEG MOVEMENT DATA The total Periodic Limb Movements of Sleep (PLMS) were 0. The PLMS index was 0.00. A PLMS index of <15 is considered normal in adults.  IMPRESSIONS - An optimal  BiPAP pressure was selected for this patient ( 17 /13 cm of water). He could not tolerate CPAP due to pressure & difficulty exhaling. - Central sleep apnea was not noted during this titration (CAI = 2.2/h). - Severe oxygen desaturations were observed during this titration (min O2 = 0.00%). 1 L O2 was added at 3:46 am& titrated to 2 L - The patient snored with moderate snoring volume. - No cardiac abnormalities were observed during this study. - Clinically significant periodic limb movements were not noted during this study. Arousals associated with PLMs were rare.   DIAGNOSIS - Obstructive Sleep Apnea (G47.33)   RECOMMENDATIONS - Trial of BiPAP therapy on 17/13 cm H2O with a Medium size Fisher&Paykel Nasal Eson2 mask and heated humidification. Corelate with download report & may need further uptitration if residual events present. Alternatively can use autoBiPAP settings with EPAP 10, PS +4, IPAP max 20 - 2L O2 should be blended into BiPAP - Avoid alcohol, sedatives and other CNS depressants that may worsen sleep apnea and disrupt normal sleep architecture. - Sleep hygiene should be reviewed to assess factors that may improve sleep quality. - Weight management and regular exercise should be initiated or continued. - Return to [facility] Center for re-evaluation after 4 weeks of therapy    Cyril Mourning MD Board Certified in Sleep medicine

## 2021-05-26 NOTE — Telephone Encounter (Signed)
Called and spoke to patient he voiced understanding and MAR has been updated

## 2021-06-01 ENCOUNTER — Other Ambulatory Visit: Payer: Self-pay | Admitting: "Endocrinology

## 2021-06-01 DIAGNOSIS — E1165 Type 2 diabetes mellitus with hyperglycemia: Secondary | ICD-10-CM

## 2021-06-01 NOTE — Progress Notes (Signed)
Called and spoke with patient, advised of results/recommendations per Rexene Edison NP.  He verbalized understanding.  He currently uses [facility] for his oxygen and CPAP, he would like to change to [facility] going forward.  Scheduled for Thursday, 06/03/2021 at 3:30 pm, advised to arrive at 3:15 pm for check in.  Nothing further needed.

## 2021-06-02 ENCOUNTER — Telehealth: Payer: Self-pay | Admitting: "Endocrinology

## 2021-06-02 ENCOUNTER — Other Ambulatory Visit: Payer: Self-pay | Admitting: "Endocrinology

## 2021-06-02 DIAGNOSIS — E1165 Type 2 diabetes mellitus with hyperglycemia: Secondary | ICD-10-CM

## 2021-06-03 ENCOUNTER — Ambulatory Visit: Payer: Medicaid Other | Admitting: Adult Health

## 2021-06-03 ENCOUNTER — Encounter: Payer: Self-pay | Admitting: Adult Health

## 2021-06-03 DIAGNOSIS — G4730 Sleep apnea, unspecified: Secondary | ICD-10-CM

## 2021-06-03 DIAGNOSIS — G4733 Obstructive sleep apnea (adult) (pediatric): Secondary | ICD-10-CM | POA: Diagnosis not present

## 2021-06-03 DIAGNOSIS — E662 Morbid (severe) obesity with alveolar hypoventilation: Secondary | ICD-10-CM | POA: Diagnosis not present

## 2021-06-03 DIAGNOSIS — J4520 Mild intermittent asthma, uncomplicated: Secondary | ICD-10-CM | POA: Diagnosis not present

## 2021-06-03 DIAGNOSIS — I5041 Acute combined systolic (congestive) and diastolic (congestive) heart failure: Secondary | ICD-10-CM

## 2021-06-03 DIAGNOSIS — J9611 Chronic respiratory failure with hypoxia: Secondary | ICD-10-CM

## 2021-06-03 MED ORDER — ALBUTEROL SULFATE HFA 108 (90 BASE) MCG/ACT IN AERS: 2.0000 | INHALATION_SPRAY | Freq: Four times a day (QID) | RESPIRATORY_TRACT | 2 refills | Status: DC | PRN

## 2021-06-03 MED ORDER — HUMULIN R U-500 KWIKPEN 500 UNIT/ML ~~LOC~~ SOPN: 80.0000 [IU] | PEN_INJECTOR | Freq: Three times a day (TID) | SUBCUTANEOUS | 0 refills | Status: DC

## 2021-06-03 NOTE — Telephone Encounter (Signed)
Pt is calling about getting this refilled. It shows refused due to refill not appropriate. The date for 5/16 shows fill later, patient never received this refill. Please advise.   [facility] - [place], [place] - [address] Phone:  [phone]  Fax:  [phone]

## 2021-06-03 NOTE — Patient Instructions (Addendum)
Continue on Oxygen 2l/m with activity as needed to keep O2 sats >88-90%.  We will change from your CPAP over to BiPAP.  This order will be sent to your homecare company.  You will also need oxygen 2 L with your BiPAP. Wear your BiPAP all night long and with naps Work on healthy weight loss Do not drive if sleepy Healthy sleep regimen Albuterol inhaler As needed   Follow-up in 3 months with Dr. Melvyn Novas  or Tyrica Afzal NP and as needed

## 2021-06-03 NOTE — Addendum Note (Signed)
Addended by: Vanessa Barbara on: 06/03/2021 05:49 PM   Modules accepted: Orders

## 2021-06-03 NOTE — Assessment & Plan Note (Signed)
Sleep study showed persistent nocturnal desaturations despite BiPAP.  We will begin oxygen 2 L with BiPAP. Since hospitalization for CHF -improvement with diuresis and weight loss.  Patient has not been needing daytime oxygen.  Advised to use oxygen as needed to keep O2 saturations greater than 88 to 90%. CT chest was negative for PE.  Plan  Patient Instructions  Continue on Oxygen 2l/m with activity as needed to keep O2 sats >88-90%.  We will change from your CPAP over to BiPAP.  This order will be sent to your homecare company.  You will also need oxygen 2 L with your BiPAP. Wear your BiPAP all night long and with naps Work on healthy weight loss Do not drive if sleepy Healthy sleep regimen Albuterol inhaler As needed   Follow-up in 3 months with Dr. Sherene Sires  or Donnielle Addison NP and as needed

## 2021-06-03 NOTE — Progress Notes (Signed)
_0  ID: [name], male    DOB: 01-27-1960, 61 y.o.   MRN: [id number]  Chief Complaint  Patient presents with   Follow-up    Referring provider: Leeanne Rio, MD  HPI: 61 year old male never smoker seen for sleep consult March 01, 2021 to establish for sleep apnea. Previously seen by Dr. Melvyn Novas May 20, 2019 for shortness of breath Medical history significant for diabetes, hypertension, congestive heart failure, mild asthma   TEST/EVENTS :  Spirometry 07/18/12   FEV1 1.90 (48%)  Ratio 0.69 with atypical curve in effort dep portion not typical of true airflow obst - trial off spiriva/ acei 05/20/2019 >>>   - PFT's  06/25/2019  FEV1 2.49 (64 % ) ratio 0.81  p 0 % improvement from saba p nothing  prior to study with DLCO  24.79 (84%) corrects to 5.57 (129%)  for alv volume and FV curve truncated exp effort dep portion and some insp truncation as well  But no true plateaus  -  ERV 16%   06/03/2021 Follow up : OSA  Patient returns for 62-monthfollow-up.  Patient was seen last visit for sleep consult to establish for sleep apnea.  Patient says that he was diagnosed with sleep apnea in 1998.  He has had multiple CPAP in the past.  Currently has been using a neighbors machine that was new.  He says he wears his CPAP machine every single night cannot sleep without it.  CPAP download last visit showed 100% compliance with daily average usage at 10 hours.  But AHI at 34/hour.  Patient was set up for a CPAP titration study. This was completed on May 21, 2021.  This showed suboptimal control on CPAP.  Needed BiPAP 17/13 cm H2O.  Mild central sleep apnea noted at 2.2/hour.  Severe oxygen desaturations noted during titration.  Required 2 L of oxygen with BiPAP to maintain O2 saturations.  We discussed sleep study results and need to change from CPAP to BiPAP.  Patient is in agreement.  Was having progressive dyspnea last office visit . Set up for CT chest , that was negative for PE.  Started on  Oxygen 2l/m with activity .  Was referred to cardiology. Admitted end of March for acute CHF. Improved with diuresis . Echo showed EF 55-60%, Weight is down 50lbs. [place] has not started due to insurance.  Has not needed oxygen during daytime since discharge .  Says he is feeling better with his breathing not quite as short of breath.  Continues to have some leg swelling but this is also much better.  Lives alone at home, drives.    Allergies  Allergen Reactions   Biaxin [Clarithromycin] Anaphylaxis and Shortness Of Breath   Ace Inhibitors     D/c ACEi 05/20/2019 > changed to clonidine for pain control component   Although even in retrospect it may not be clear the ACEi contributed to the pt's symptoms, adding them back at this point or in the future would risk confusion in interpretation of non-specific respiratory symptoms to which this patient is prone ie Better not to muddy the waters here.      Immunization History  Administered Date(s) Administered   Influenza Inj Mdck Quad Pf 10/31/2019, 11/12/2020   Influenza-Unspecified 10/18/2010, 10/18/2011, 10/18/2012, 12/11/2013, 09/11/2014, 10/13/2015, 09/06/2018   Moderna Sars-Covid-2 Vaccination 04/01/2019, 04/29/2019   PNEUMOCOCCAL CONJUGATE-20 05/08/2020   Tdap 04/13/2016    Past Medical History:  Diagnosis Date   Allergic rhinitis, unspecified  Allergy    Anxiety disorder, unspecified    Asthma    Back pain    Balanitis    Cellulitis    COPD (chronic obstructive pulmonary disease) (HCC)    Diabetes mellitus without complication (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 400 strip 2   Accu-Chek FastClix Lancets MISC USE TO CHECK BLOOD SUGAR FOUR TIMES A DAY. 102 each 2   albuterol (PROVENTIL) (2.5 MG/3ML) 0.083% nebulizer solution Take 3 mLs (2.5 mg total) by nebulization every 6 (six) hours as needed for wheezing or shortness of breath. 150 mL 1   allopurinol (ZYLOPRIM) 300 MG tablet Take 300 mg by mouth daily.       ALPRAZolam (XANAX) 0.5 MG tablet Take 1 tablet by mouth 2 (two) times daily as needed for anxiety.     ALPRAZolam (XANAX) 0.5 MG tablet Take by mouth.     Blood Glucose Monitoring Suppl (ACCU-CHEK GUIDE) w/Device KIT 1 Piece by Does not apply route as directed. 1 kit 0   bumetanide (BUMEX) 1 MG tablet Take 1 tablet (1 mg total) by mouth daily. 30 tablet 11   BYDUREON BCISE 2 MG/0.85ML AUIJ INJECT INTO THE SKIN ONCE A WEEK. 3.4 mL 1   cetirizine (ZYRTEC) 10 MG tablet Take 10 mg by mouth daily.     diclofenac Sodium (VOLTAREN) 1 % GEL Apply 2 g topically 4 (four) times daily as needed (pain).     glipiZIDE (GLUCOTROL XL) 5 MG 24 hr tablet TAKE 1 TABLET ONCE DAILY WITH BREAKFAST. 30 tablet 2   GLOBAL EASE INJECT PEN NEEDLES 31G X 8 MM MISC USE 4 TIMES DAILY. 100 each 0   insulin regular human CONCENTRATED (HUMULIN R U-500 KWIKPEN) 500 UNIT/ML KwikPen Inject 80-110 Units into the skin 3 (three) times daily with meals. 60 mL 0   metFORMIN (GLUCOPHAGE) 500 MG tablet TAKE 2 TABLETS BY MOUTH TWICE DAILY. 360 tablet 0   Multiple Vitamin (MULTIVITAMIN WITH MINERALS) TABS tablet Take 1 tablet  by mouth daily.     oxyCODONE-acetaminophen (PERCOCET) 7.5-325 MG tablet Take 1 tablet by mouth every 6 (six) hours as needed for severe pain.     pantoprazole (PROTONIX) 40 MG tablet TAKE 1 TABLET DAILY, 30 MINUTES BEFORE BREAKFAST ` (Patient taking differently: Take 40 mg by mouth daily.) 90 tablet 3   pravastatin (PRAVACHOL) 40 MG tablet Take 40 mg by mouth daily.     sacubitril-valsartan (ENTRESTO) 49-51 MG Take 1 tablet by mouth 2 (two) times daily. 180 tablet 0   Vitamin D, Ergocalciferol, (DRISDOL) 1.25  MG (50000 UNIT) CAPS capsule Take 1 capsule (50,000 Units total) by mouth once a week. 4 capsule 3   spironolactone (ALDACTONE) 25 MG tablet Take 1 tablet (25 mg total) by mouth daily. 30 tablet 0   verapamil (CALAN-SR) 120 MG CR tablet Take 1 tablet (120 mg total) by mouth daily. 30 tablet 0   No facility-administered medications prior to visit.     Review of Systems:   Constitutional:   No  weight loss, night sweats,  Fevers, chills, fatigue, or  lassitude.  HEENT:   No headaches,  Difficulty swallowing,  Tooth/dental problems, or  Sore throat,                No sneezing, itching, ear ache, nasal congestion, post nasal drip,   CV:  No chest pain,  Orthopnea, PND, swelling in lower extremities, anasarca, dizziness, palpitations, syncope.   GI  No heartburn, indigestion, abdominal pain, nausea, vomiting, diarrhea, change in bowel habits, loss of appetite, bloody stools.   Resp: No shortness of breath with exertion or at rest.  No excess mucus, no productive cough,  No non-productive cough,  No coughing up of blood.  No change in color of mucus.  No wheezing.  No chest wall deformity  Skin: no rash or lesions.  GU: no dysuria, change in color of urine, no urgency or frequency.  No flank pain, no hematuria   MS:  No joint pain or swelling.  No decreased range of motion.  No back pain.    Physical Exam  BP 130/66 (BP Location: Left Arm, Patient Position: Sitting, Cuff Size:  Large)   Pulse 83   Temp 98 F (36.7 C) (Oral)   Ht _0  (1.753 m)   Wt (!) 444 lb (201.4 kg)   SpO2 92%   BMI 65.57 kg/m   GEN: A/Ox3; pleasant , NAD, morbid obesity   HEENT:  [place]/AT,  EACs-clear, TMs-wnl, NOSE-clear, THROAT-clear, no lesions, no postnasal drip or exudate noted.  Class III-IV MP airway  NECK:  Supple w/ fair ROM; no JVD; normal carotid impulses w/o bruits; no thyromegaly or nodules palpated; no lymphadenopathy.    RESP  Clear  P & A; w/o, wheezes/ rales/ or rhonchi. no accessory muscle use, no dullness to percussion  CARD:  RRR, no m/r/g, 1+ peripheral edema, pulses intact, no cyanosis or clubbing.  GI:   Soft & nt; nml bowel sounds; no organomegaly or masses detected.   Musco: Warm bil, no deformities or joint swelling noted.   Neuro: alert, no focal deficits noted.    Skin: Warm, no lesions or rashes    Lab Results:   BNP   Imaging: Cpap titration  Result Date: 05/21/2021 Rigoberto Noel, MD     05/26/2021  4:06 PM Patient Name: Claudell Kyle Date: 05/21/2021 Gender: Male D.O.B: Jun 14, 1960 Age (years): 7 Referring Provider: Lynelle Smoke Cristen Bredeson Height (inches): 71 Interpreting Physician: Kara Mead MD, ABSM Weight (lbs): 441 RPSGT: Peak, Robert BMI: 62 MRN: [id number] Neck Size: 26.50 <br> <br> CLINICAL INFORMATION The patient is referred for a PAP titration to treat sleep apnea. Baseline study was not available SLEEP STUDY TECHNIQUE As per the AASM Manual for the Scoring of Sleep and Associated Events v2.3 (April 2016) with a hypopnea requiring 4% desaturations. The channels recorded and monitored were frontal, central and occipital EEG, electrooculogram (EOG), submentalis EMG (chin), nasal and oral airflow, thoracic and abdominal wall motion, anterior tibialis EMG, snore microphone, electrocardiogram, and pulse oximetry. Bilevel positive airway  pressure (BPAP) was initiated at the beginning of the study and titrated to treat sleep-disordered breathing.  MEDICATIONS Medications self-administered by patient taken the night of the study : N/A RESPIRATORY PARAMETERS Optimal IPAP Pressure (cm): 17 AHI at Optimal Pressure (/hr) 10.9 Optimal EPAP Pressure (cm): 13 Overall Minimal O2 (%): 0.00 Minimal O2 at Optimal Pressure (%): 84.0 SLEEP ARCHITECTURE Start Time: 9:35:26 PM Stop Time: 4:59:04 AM Total Time (min): 443.6 Total Sleep Time (min): 332.5 Sleep Latency (min): 19.6 Sleep Efficiency (%): 74.9 REM Latency (min): 150.5 WASO (min): 91.6 Stage N1 (%): 10.08 Stage N2 (%): 68.57 Stage N3 (%): 0.15 Stage R (%): 21.2 Supine (%): 1.05 Arousal Index (/hr): 17.0 CARDIAC DATA The 2 lead EKG demonstrated sinus rhythm. The mean heart rate was 48.54 beats per minute. Other EKG findings include: None. LEG MOVEMENT DATA The total Periodic Limb Movements of Sleep (PLMS) were 0. The PLMS index was 0.00. A PLMS index of <15 is considered normal in adults. IMPRESSIONS - An optimal BiPAP pressure was selected for this patient ( 17 /13 cm of water). He could not tolerate CPAP due to pressure & difficulty exhaling. - Central sleep apnea was not noted during this titration (CAI = 2.2/h). - Severe oxygen desaturations were observed during this titration (min O2 = 0.00%). 1 L O2 was added at 3:46 am& titrated to 2 L - The patient snored with moderate snoring volume. - No cardiac abnormalities were observed during this study. - Clinically significant periodic limb movements were not noted during this study. Arousals associated with PLMs were rare. DIAGNOSIS - Obstructive Sleep Apnea (G47.33) RECOMMENDATIONS - Trial of BiPAP therapy on 17/13 cm H2O with a Medium size Fisher&Paykel Nasal Eson2 mask and heated humidification. Corelate with download report & may need further uptitration if residual events present. Alternatively can use autoBiPAP settings with EPAP 10, PS +4, IPAP max 20 - 2L O2 should be blended into BiPAP - Avoid alcohol, sedatives and other CNS depressants that may worsen  sleep apnea and disrupt normal sleep architecture. - Sleep hygiene should be reviewed to assess factors that may improve sleep quality. - Weight management and regular exercise should be initiated or continued. - Return to [facility] for re-evaluation after 4 weeks of therapy Kara Mead MD Board Certified in [place]  Result Date: 05/25/2021 Ordered by an unspecified provider.        Latest Ref Rng & Units 06/25/2019    9:39 AM  PFT Results  FVC-Pre L 3.06    FVC-Predicted Pre % 60    FVC-Post L 2.88    FVC-Predicted Post % 56    Pre FEV1/FVC % % 81    Post FEV1/FCV % % 77    FEV1-Pre L 2.49    FEV1-Predicted Pre % 64    FEV1-Post L 2.22    DLCO uncorrected ml/min/mmHg 24.79    DLCO UNC% % 84    DLVA Predicted % 129    TLC L 5.25    TLC % Predicted % 73    RV % Predicted % 103      No results found for: NITRICOXIDE      Assessment & Plan:   Obstructive sleep apnea Severe obstructive sleep apnea with suboptimal control on CPAP.  Patient was set up for CPAP titration study done on May 21, 2021 that showed he required BiPAP support for optimal control. We will change from CPAP to BiPAP.  We will begin auto BiPAP with  IPAP max at 20, EPAP minimum 10 and pressure support of 4 will require 2 L of oxygen with BiPAP.  Trial of BiPAP therapy on 17/13 cm H2O with a Medium size Fisher&Paykel Nasal Eson2 mask and heated humidification. Corelate with download report & may need further uptitration if residual events present. Alternatively can use autoBiPAP settings with EPAP 10, PS +4, IPAP max 20 - 2L O2 should be blended into BiPAP  Patient education given on sleep study and changed to BiPAP.  Plan  There are no Patient Instructions on file for this visit.    Sleep apnea, unspecified Severe obstructive sleep apnea despite excellent compliance with CPAP patient has significant residual events.  He was set up for a CPAP titration study.  This  showed that he requires BiPAP with oxygen.  Suspect he has underlying OHS as well.  Has morbid obesity with a BMI at 44. Patient will change over to BiPAP.  We will begin auto BiPAP with IPAP max at 20 and EPAP minimum at 10, pressure for 4 cm H2O. We will send order to [facility] per patient request  Plan  Patient Instructions  Continue on Oxygen 2l/m with activity as needed to keep O2 sats >88-90%.  We will change from your CPAP over to BiPAP.  This order will be sent to your homecare company.  You will also need oxygen 2 L with your BiPAP. Wear your BiPAP all night long and with naps Work on healthy weight loss Do not drive if sleepy Healthy sleep regimen Albuterol inhaler As needed   Follow-up in 3 months with Dr. Melvyn Novas  or Nena Hampe NP and as needed     Morbid (severe) obesity with alveolar hypoventilation ([place]) Encouraged on healthy weight loss  Uncomplicated asthma Appears to be controlled.  Continue with albuterol as needed.  Acute CHF ([place]) Recent hospitalization for decompensated congestive heart failure.  Patient has significant diuresis and weight loss.  Patient is continue on current regimen and follow-up with cardiology.  Chronic respiratory failure with hypoxia (HCC) Sleep study showed persistent nocturnal desaturations despite BiPAP.  We will begin oxygen 2 L with BiPAP. Since hospitalization for CHF -improvement with diuresis and weight loss.  Patient has not been needing daytime oxygen.  Advised to use oxygen as needed to keep O2 saturations greater than 88 to 90%. CT chest was negative for PE.  Plan  Patient Instructions  Continue on Oxygen 2l/m with activity as needed to keep O2 sats >88-90%.  We will change from your CPAP over to BiPAP.  This order will be sent to your homecare company.  You will also need oxygen 2 L with your BiPAP. Wear your BiPAP all night long and with naps Work on healthy weight loss Do not drive if sleepy Healthy sleep  regimen Albuterol inhaler As needed   Follow-up in 3 months with Dr. Melvyn Novas  or Frona Yost NP and as needed       Rexene Edison, NP 06/03/2021

## 2021-06-03 NOTE — Addendum Note (Signed)
Addended by: Derrell Lolling on: 06/03/2021 10:47 AM   Modules accepted: Orders

## 2021-06-03 NOTE — Telephone Encounter (Signed)
Rx resent to [facility]

## 2021-06-03 NOTE — Assessment & Plan Note (Signed)
Encouraged on healthy weight loss 

## 2021-06-03 NOTE — Assessment & Plan Note (Signed)
Appears to be controlled.  Continue with albuterol as needed.

## 2021-06-03 NOTE — Assessment & Plan Note (Signed)
Severe obstructive sleep apnea despite excellent compliance with CPAP patient has significant residual events.  He was set up for a CPAP titration study.  This showed that he requires BiPAP with oxygen.  Suspect he has underlying OHS as well.  Has morbid obesity with a BMI at 86. Patient will change over to BiPAP.  We will begin auto BiPAP with IPAP max at 20 and EPAP minimum at 10, pressure for 4 cm H2O. We will send order to [facility] per patient request  Plan  Patient Instructions  Continue on Oxygen 2l/m with activity as needed to keep O2 sats >88-90%.  We will change from your CPAP over to BiPAP.  This order will be sent to your homecare company.  You will also need oxygen 2 L with your BiPAP. Wear your BiPAP all night long and with naps Work on healthy weight loss Do not drive if sleepy Healthy sleep regimen Albuterol inhaler As needed   Follow-up in 3 months with Dr. Melvyn Novas  or Shantera Monts NP and as needed

## 2021-06-03 NOTE — Assessment & Plan Note (Signed)
Recent hospitalization for decompensated congestive heart failure.  Patient has significant diuresis and weight loss.  Patient is continue on current regimen and follow-up with cardiology.

## 2021-06-03 NOTE — Assessment & Plan Note (Signed)
Severe obstructive sleep apnea with suboptimal control on CPAP.  Patient was set up for CPAP titration study done on May 21, 2021 that showed he required BiPAP support for optimal control. We will change from CPAP to BiPAP.  We will begin auto BiPAP with IPAP max at 20, EPAP minimum 10 and pressure support of 4 will require 2 L of oxygen with BiPAP.  Trial of BiPAP therapy on 17/13 cm H2O with a Medium size Fisher&Paykel Nasal Eson2 mask and heated humidification. Corelate with download report & may need further uptitration if residual events present. Alternatively can use autoBiPAP settings with EPAP 10, PS +4, IPAP max 20 - 2L O2 should be blended into BiPAP  Patient education given on sleep study and changed to BiPAP.  Plan  There are no Patient Instructions on file for this visit.

## 2021-06-07 ENCOUNTER — Telehealth: Payer: Self-pay | Admitting: Adult Health

## 2021-06-08 ENCOUNTER — Other Ambulatory Visit: Payer: Self-pay

## 2021-06-08 DIAGNOSIS — M7989 Other specified soft tissue disorders: Secondary | ICD-10-CM

## 2021-06-08 DIAGNOSIS — I10 Essential (primary) hypertension: Secondary | ICD-10-CM

## 2021-06-08 DIAGNOSIS — I5031 Acute diastolic (congestive) heart failure: Secondary | ICD-10-CM

## 2021-06-08 NOTE — Progress Notes (Signed)
Called pt to inform him about his labs pt understood to go some time this week to recheck his potassium.

## 2021-06-08 NOTE — Progress Notes (Signed)
External Labs: Collected: 05/10/2021 BUN 17, creatinine 1.04. Sodium 141, potassium 5.7, chloride 101, bicarb 27 NT proBNP 162  Please inform the patient to have labs done this week to reevaluate potassium levels which were above normal limits.  Please order and release labs.  Please remind the patient to bring the caridac medication bottles at the next office visit to help facilitate a more accurate medication reconciliation.  Aaron Fehrenbach [place], DO, [facility]

## 2021-06-08 NOTE — Telephone Encounter (Signed)
Original sleep study printed and will be faxed to [facility].

## 2021-06-10 LAB — BMP8+EGFR
BUN/Creatinine Ratio: 10 (ref 9–20)
BUN: 8 mg/dL (ref 6–24)
CO2: 26 mmol/L (ref 20–29)
Calcium: 10 mg/dL (ref 8.7–10.2)
Chloride: 100 mmol/L (ref 96–106)
Creatinine, Ser: 0.83 mg/dL (ref 0.76–1.27)
Glucose: 112 mg/dL — ABNORMAL HIGH (ref 70–99)
Potassium: 4.3 mmol/L (ref 3.5–5.2)
Sodium: 141 mmol/L (ref 134–144)
eGFR: 101 mL/min/{1.73_m2} (ref >59)

## 2021-06-10 NOTE — Progress Notes (Signed)
Called and spoke with patient regarding his recent lab results.

## 2021-06-14 ENCOUNTER — Encounter: Payer: Self-pay | Admitting: Cardiology

## 2021-06-14 ENCOUNTER — Ambulatory Visit: Payer: Medicaid Other | Admitting: Cardiology

## 2021-06-14 VITALS — BP 134/45 | HR 72 | Temp 98.0°F | Resp 17 | Ht 69.0 in | Wt >= 6400 oz

## 2021-06-14 DIAGNOSIS — I5032 Chronic diastolic (congestive) heart failure: Secondary | ICD-10-CM

## 2021-06-14 DIAGNOSIS — G4733 Obstructive sleep apnea (adult) (pediatric): Secondary | ICD-10-CM

## 2021-06-14 DIAGNOSIS — E782 Mixed hyperlipidemia: Secondary | ICD-10-CM

## 2021-06-14 DIAGNOSIS — Z8249 Family history of ischemic heart disease and other diseases of the circulatory system: Secondary | ICD-10-CM

## 2021-06-14 DIAGNOSIS — I10 Essential (primary) hypertension: Secondary | ICD-10-CM

## 2021-06-14 DIAGNOSIS — E119 Type 2 diabetes mellitus without complications: Secondary | ICD-10-CM

## 2021-06-14 MED ORDER — SPIRONOLACTONE 25 MG PO TABS: 25.0000 mg | ORAL_TABLET | Freq: Every day | ORAL | 0 refills | Status: DC

## 2021-06-14 MED ORDER — SACUBITRIL-VALSARTAN 49-51 MG PO TABS: 1.0000 | ORAL_TABLET | Freq: Two times a day (BID) | ORAL | 0 refills | Status: DC

## 2021-06-14 NOTE — Progress Notes (Signed)
Date:  06/14/2021   ID:  [name], DOB May 05, 1960, MRN [id number]  PCP:  Leeanne Rio, MD  Cardiologist: Dr. Cathie Hoops [facility]) and [place], [place], [facility] (established care 08/12/2020)  Date: 06/14/21 Last Office Visit: 04/21/2021  No chief complaint on file.   HPI  Aaron Potter is a 61 y.o. male whose past medical history and cardiovascular risk factors include: HFpEF, hypertension, Hypertension, hyperlipidemia, insulin-dependent diabetes mellitus type 2, OSA on BiPAP, family history of heart disease, morbid obesity (Body mass index is 65.42 kg/m.).  Patient is accompanied by his sister start at today's visit.  He provides verbal consent with regards to her being present during today's encounter and she also provides collateral history.  Patient was discharged from the hospital in April 2023 after being admitted for heart failure exacerbation.  He was diuresed 22 pounds.  The day after discharge he weighed 446 pounds at home.  At last office visit I started him on Farxiga but later he called the office stating that he is getting yeast infection and therefore the shared decision was to discontinue medical therapy.  He also had issues with hyperkalemia therefore his spironolactone was discontinued.  Unknowingly patient was also taking potassium supplements which are currently held.  He was also recommended to discontinue olmesartan and transition her to [place] during prior telephone encounters; however, patient states that medication is not approved and he is currently not taking it.  Home blood pressure and weight log reviewed.  According to his home scale he has gained approximately 2 pounds since last office visit.  Based on the office scale he has gained about 9 pounds at the last visit.  He denies angina pectoris.  FUNCTIONAL STATUS: No family history of premature coronary disease or sudden cardiac death.   ALLERGIES: Allergies  Allergen Reactions   Biaxin  [Clarithromycin] Anaphylaxis and Shortness Of Breath   Ace Inhibitors     D/c ACEi 05/20/2019 > changed to clonidine for pain control component   Although even in retrospect it may not be clear the ACEi contributed to the pt's symptoms, adding them back at this point or in the future would risk confusion in interpretation of non-specific respiratory symptoms to which this patient is prone ie Better not to muddy the waters here.      MEDICATION LIST PRIOR TO VISIT: Current Meds  Medication Sig   ACCU-CHEK AVIVA PLUS test strip USE TO CHECK BLOOD SUGAR 4 TIMES DAILY.   Accu-Chek FastClix Lancets MISC USE TO CHECK BLOOD SUGAR FOUR TIMES A DAY.   albuterol (PROVENTIL) (2.5 MG/3ML) 0.083% nebulizer solution Take 3 mLs (2.5 mg total) by nebulization every 6 (six) hours as needed for wheezing or shortness of breath.   albuterol (VENTOLIN HFA) 108 (90 Base) MCG/ACT inhaler Inhale 2 puffs into the lungs every 6 (six) hours as needed for wheezing or shortness of breath.   allopurinol (ZYLOPRIM) 300 MG tablet Take 300 mg by mouth daily.     ALPRAZolam (XANAX) 0.5 MG tablet Take 1 tablet by mouth 2 (two) times daily as needed for anxiety.   Blood Glucose Monitoring Suppl (ACCU-CHEK GUIDE) w/Device KIT 1 Piece by Does not apply route as directed.   bumetanide (BUMEX) 1 MG tablet Take 1 tablet (1 mg total) by mouth daily.   BYDUREON BCISE 2 MG/0.85ML AUIJ INJECT INTO THE SKIN ONCE A WEEK.   cetirizine (ZYRTEC) 10 MG tablet Take 10 mg by mouth daily.   diclofenac Sodium (VOLTAREN) 1 %  GEL Apply 2 g topically 4 (four) times daily as needed (pain).   GLOBAL EASE INJECT PEN NEEDLES 31G X 8 MM MISC USE 4 TIMES DAILY.   insulin regular human CONCENTRATED (HUMULIN R U-500 KWIKPEN) 500 UNIT/ML KwikPen Inject 80-110 Units into the skin 3 (three) times daily with meals.   metFORMIN (GLUCOPHAGE) 500 MG tablet TAKE 2 TABLETS BY MOUTH TWICE DAILY.   Multiple Vitamin (MULTIVITAMIN WITH MINERALS) TABS tablet Take 1 tablet  by mouth daily.   oxyCODONE-acetaminophen (PERCOCET) 7.5-325 MG tablet Take 1 tablet by mouth every 6 (six) hours as needed for severe pain.   pantoprazole (PROTONIX) 40 MG tablet TAKE 1 TABLET DAILY, 30 MINUTES BEFORE BREAKFAST ` (Patient taking differently: Take 40 mg by mouth daily.)   pravastatin (PRAVACHOL) 40 MG tablet Take 40 mg by mouth daily.   silver sulfADIAZINE (SILVADENE) 1 % cream Apply 1 application  topically daily.   verapamil (CALAN-SR) 120 MG CR tablet Take 1 tablet (120 mg total) by mouth daily.   Vitamin D, Ergocalciferol, (DRISDOL) 1.25 MG (50000 UNIT) CAPS capsule Take 1 capsule (50,000 Units total) by mouth once a week.     PAST MEDICAL HISTORY: Past Medical History:  Diagnosis Date   Allergic rhinitis, unspecified    Allergy    Anxiety disorder, unspecified    Asthma    Back pain    Balanitis    Cellulitis    COPD (chronic obstructive pulmonary disease) (HCC)    Diabetes mellitus without complication ([place])    Essential (primary) hypertension    Gastro-esophageal reflux disease with esophagitis    Gout    Gout, unspecified    Hypertension    Lichen sclerosus    Mixed hyperlipidemia    Morbid (severe) obesity with alveolar hypoventilation (HCC)    Obesity, morbid (more than 100 lbs over ideal weight or BMI > 40) (HCC)    Obesity, unspecified    Polyosteoarthritis, unspecified    Sleep apnea    Sleep apnea, unspecified    CPAP   Type 2 diabetes mellitus with hyperglycemia (HCC)    Urticaria    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  SOCIAL HISTORY:  The patient  reports that  he has never smoked. He has never used smokeless tobacco. He reports that he does not drink alcohol and does not use drugs.  REVIEW OF SYSTEMS: Review of Systems  Constitutional: Positive for weight gain. Negative for chills, fever and weight loss.  HENT:  Negative for hoarse voice and nosebleeds.   Eyes:  Negative for discharge, double vision and pain.  Cardiovascular:  Positive for dyspnea on exertion (better) and leg swelling. Negative for chest pain, claudication, near-syncope, orthopnea, palpitations, paroxysmal nocturnal dyspnea and syncope.  Respiratory:  Positive for shortness of breath. Negative for hemoptysis.   Musculoskeletal:  Negative for muscle cramps and myalgias.  Gastrointestinal:  Negative for abdominal pain, constipation, diarrhea, hematemesis, hematochezia, melena, nausea and vomiting.  Neurological:  Negative for dizziness and light-headedness.    PHYSICAL EXAM:    06/14/2021   10:20 AM 06/03/2021    3:20 PM  05/18/2021    9:47 AM  Vitals with BMI  Height _0  _1  _2   Weight 443 lbs 444 lbs 451 lbs 13 oz  BMI 65.39 22.97 98.92  Systolic 119 417   Diastolic 45 66   Pulse 72 83 84    CONSTITUTIONAL: Appears older than stated age, hemodynamically stable, no acute distress. SKIN: Skin is warm and dry. No rash noted. No cyanosis. No pallor. No jaundice HEAD: Normocephalic and atraumatic.  EYES: No scleral icterus MOUTH/THROAT: Moist oral membranes.  NECK: Unable to evaluate JVP due to short neck stature and significant adipose tissue, do not appreciate carotid bruits. LYMPHATIC: No visible cervical adenopathy.  CHEST Normal respiratory effort. No intercostal retractions  LUNGS: Decreased breath sounds bilaterally.  No stridor. No wheezes. No rales.  CARDIOVASCULAR: Distant heart sound, faint S1-S2, no murmurs rubs or gallops appreciated. ABDOMINAL: Morbidly obese, soft, nontender, nondistended, positive bowel sounds in all 4 quadrants. EXTREMITIES: +1  bilateral pitting edema, decreased DP and PT pulses. HEMATOLOGIC: No significant bruising NEUROLOGIC: Oriented to person, place, and time. Nonfocal. Normal muscle tone.  PSYCHIATRIC: Normal mood and affect. Normal behavior. Cooperative  CARDIAC DATABASE: EKG: 04/21/2021: Normal sinus rhythm, 75 bpm, poor R wave progression, without underlying ischemia injury pattern.   Echocardiogram: 05/01/20 (Care Everywhere OV 08/04/2020) 1. Technically difficult study. 2. The left ventricle is not well visualized but probably normal in size with mildly increased wall thickness. 3. Overall left ventricular systolic function appears normal. 4. The left atrium is moderately dilated in size. 5. The right ventricle is mildly dilated in size. 6. The right atrium is mildly dilated in size. 7. Gradient and calculations are suggestive of some degree of aortic stenosis (possibly moderate).  04/05/2021: Technically difficult study despite Definity given the body habitus, probably normal LVEF 55-60% indeterminate diastolic filling pattern.  Stress Testing: No results found for this or any previous visit from the past 1095 days.  Heart Catheterization: None  LABORATORY DATA:    Latest Ref Rng & Units 04/01/2021    5:24 AM 03/31/2021   11:20 AM 04/01/2019    8:43 AM  CBC  WBC 4.0 - 10.5 K/uL 9.1  8.0  9.0   Hemoglobin 13.0 - 17.0 g/dL 13.2  13.5  14.8   Hematocrit 39.0 - 52.0 % 44.9  47.0  45.6   Platelets 150 - 400 K/uL 316  303  316        Latest Ref Rng & Units 06/09/2021    8:29 AM 05/10/2021    8:08 AM 04/09/2021    5:39 AM  CMP  Glucose 70 - 99 mg/dL 112  93  273   BUN 6 - 24 mg/dL _3 Creatinine 0.76 - 1.27 mg/dL 0.83  1.00  1.04   Sodium 134 - 144 mmol/L 141  142  137   Potassium 3.5 - 5.2 mmol/L 4.3  6.0  3.4   Chloride 96 - 106 mmol/L 100  102  88   CO2 20 - 29 mmol/L 26  27  36   Calcium 8.7 - 10.2 mg/dL 10.0  9.9  9.4   Total Protein 6.0 - 8.5 g/dL  6.8    Total Bilirubin 0.0 -  1.2 mg/dL  0.4    Alkaline Phos 44 - 121 IU/L  71    AST 0 - 40 IU/L  28    ALT 0 - 44 IU/L  21      Lipid Panel  Lab Results  Component Value Date   CHOL 133 05/10/2021   HDL 39 (L) 05/10/2021   LDLCALC 75 05/10/2021   TRIG 100 05/10/2021   CHOLHDL 3.4 05/10/2021    No components found for: "NTPROBNP" Recent Labs    08/12/20 1036  PROBNP 122   Recent Labs    09/08/20 0930 05/10/21 0808  TSH 3.270 7.000*    BMP Recent Labs    04/07/21 0530 04/07/21 1217 04/08/21 0618 04/09/21 0539 05/10/21 0808 06/09/21 0829  NA 134*  --  137 137 142 141  K 3.2*  --  3.6 3.4* 6.0* 4.3  CL 82*  --  86* 88* 102 100  CO2 39*  --  37* 36* 27 26  GLUCOSE 326*   < > 262* 273* 93 112*  BUN 29*  --  28* 27* 17 8  CREATININE 1.07  --  1.16 1.04 1.00 0.83  CALCIUM 9.1  --  9.3 9.4 9.9 10.0  GFRNONAA >60  --  >60 >60  --   --    < > = values in this interval not displayed.    HEMOGLOBIN A1C Lab Results  Component Value Date   HGBA1C 8.1 (A) 01/13/2021   MPG 197 10/10/2018    External Labs:  Date Collected: 04/20/2020 , information obtained by care everywhere Potassium: 4.9 Creatinine 1.16 mg/dL. eGFR: 70 mL/min per 1.73 m Lipid profile: Total cholesterol 144 , triglycerides 114 , HDL 39 , LDL 82 AST: 34 , ALT: 39 , alkaline phosphatase: 93   Date Collected: 03/25/2020 , information obtained by Care Everywhere Hemoglobin A1c: 7.8  Date Collected: 05/05/2020 , information obtained by Care Everywhere Hemoglobin: 14.7 g/dL and hematocrit: 44.4 %  IMPRESSION:    ICD-10-CM   1. Chronic heart failure with preserved ejection fraction (HFpEF) (HCC)  I50.32 spironolactone (ALDACTONE) 25 MG tablet    sacubitril-valsartan (ENTRESTO) 49-51 MG    Basic metabolic panel    Magnesium    Pro b natriuretic peptide (BNP)    2. Benign hypertension  I10     3. Type 2 diabetes mellitus without complication, with long-term current use of insulin (HCC)  E11.9    Z79.4     4. Mixed  hyperlipidemia  E78.2     5. OSA treated with BiPAP  G47.33     6. Family history of heart disease  Z82.49        RECOMMENDATIONS: Daelon Dunivan is a 61 y.o. male whose past medical history and cardiac risk factors include: Hypertension, hyperlipidemia, insulin-dependent diabetes mellitus type 2, OSA on BiPAP, family history of heart disease, morbid obesity (Body mass index is 65.42 kg/m.).  Chronic heart failure with preserved ejection fraction (HFpEF) (HCC) Exacerbation. Stage C, NYHA class II/III Last hospitalization: April 2023, diuresed 22 pounds, discharge weight 446 pounds Currently on Bumex 1 mg p.o. daily. Farxiga discontinued secondary to yeast infection. I transitioned him from ARB to Entresto-patient states that the [facility] was not approved.  We will need to look into this We will restart spironolactone 25 mg p.o. daily. Labs in 1 week to reevaluate kidney function and electrolytes. In the meantime we will try to get the prior authorization/patient assistance approved for [facility]. Continue to check daily weights, blood pressure, and pulse. Compression stockings measured  Benign hypertension Office blood pressures are within acceptable range. Medication changes as discussed above  Type 2 diabetes mellitus without complication, with long-term current use of insulin ([place]) Educated on the importance of glycemic control. Currently  on statin therapy, transitioning ARB to ARNI. Unable to tolerate [place] due to yeast infection. Currently managed by primary care provider.  Mixed hyperlipidemia Currently on pravastatin.   He denies myalgia or other side effects. Most recent lipids dated, May 2023 independently reviewed as noted above. LDL within acceptable limits. Currently managed by primary care provider.  OSA treated with BiPAP We emphasized the importance of compliance.   FINAL MEDICATION LIST END OF ENCOUNTER: Meds ordered this encounter  Medications    spironolactone (ALDACTONE) 25 MG tablet    Sig: Take 1 tablet (25 mg total) by mouth daily.    Dispense:  30 tablet    Refill:  0   sacubitril-valsartan (ENTRESTO) 49-51 MG    Sig: Take 1 tablet by mouth 2 (two) times daily.    Dispense:  180 tablet    Refill:  0    Medications Discontinued During This Encounter  Medication Reason   spironolactone (ALDACTONE) 25 MG tablet Reorder   sacubitril-valsartan (ENTRESTO) 49-51 MG Reorder      Current Outpatient Medications:    ACCU-CHEK AVIVA PLUS test strip, USE TO CHECK BLOOD SUGAR 4 TIMES DAILY., Disp: 400 strip, Rfl: 2   Accu-Chek FastClix Lancets MISC, USE TO CHECK BLOOD SUGAR FOUR TIMES A DAY., Disp: 102 each, Rfl: 2   albuterol (PROVENTIL) (2.5 MG/3ML) 0.083% nebulizer solution, Take 3 mLs (2.5 mg total) by nebulization every 6 (six) hours as needed for wheezing or shortness of breath., Disp: 150 mL, Rfl: 1   albuterol (VENTOLIN HFA) 108 (90 Base) MCG/ACT inhaler, Inhale 2 puffs into the lungs every 6 (six) hours as needed for wheezing or shortness of breath., Disp: 8 g, Rfl: 2   allopurinol (ZYLOPRIM) 300 MG tablet, Take 300 mg by mouth daily.  , Disp: , Rfl:    ALPRAZolam (XANAX) 0.5 MG tablet, Take 1 tablet by mouth 2 (two) times daily as needed for anxiety., Disp: , Rfl:    Blood Glucose Monitoring Suppl (ACCU-CHEK GUIDE) w/Device KIT, 1 Piece by Does not apply route as directed., Disp: 1 kit, Rfl: 0   bumetanide (BUMEX) 1 MG tablet, Take 1 tablet (1 mg total) by mouth daily., Disp: 30 tablet, Rfl: 11   BYDUREON BCISE 2 MG/0.85ML AUIJ, INJECT INTO THE SKIN ONCE A WEEK., Disp: 3.4 mL, Rfl: 1   cetirizine (ZYRTEC) 10 MG tablet, Take 10 mg by mouth daily., Disp: , Rfl:    diclofenac Sodium (VOLTAREN) 1 % GEL, Apply 2 g topically 4 (four) times daily as needed (pain)., Disp: , Rfl:    GLOBAL EASE INJECT PEN NEEDLES 31G X 8 MM MISC, USE 4 TIMES DAILY., Disp: 100 each, Rfl: 0   insulin regular human CONCENTRATED (HUMULIN R U-500 KWIKPEN)  500 UNIT/ML KwikPen, Inject 80-110 Units into the skin 3 (three) times daily with meals., Disp: 60 mL, Rfl: 0   metFORMIN (GLUCOPHAGE) 500 MG tablet, TAKE 2 TABLETS BY MOUTH TWICE DAILY., Disp: 360 tablet, Rfl: 0   Multiple Vitamin (MULTIVITAMIN WITH MINERALS) TABS tablet, Take 1 tablet by mouth daily., Disp: , Rfl:    oxyCODONE-acetaminophen (PERCOCET) 7.5-325 MG tablet, Take 1 tablet by mouth every 6 (six) hours as needed for severe pain., Disp: , Rfl:    pantoprazole (PROTONIX) 40 MG tablet, TAKE 1 TABLET DAILY, 30 MINUTES BEFORE BREAKFAST ` (Patient taking differently: Take 40 mg by mouth daily.), Disp: 90 tablet, Rfl: 3   pravastatin (PRAVACHOL) 40 MG tablet, Take 40 mg by mouth daily., Disp: , Rfl:  silver sulfADIAZINE (SILVADENE) 1 % cream, Apply 1 application  topically daily., Disp: , Rfl:    verapamil (CALAN-SR) 120 MG CR tablet, Take 1 tablet (120 mg total) by mouth daily., Disp: 30 tablet, Rfl: 0   Vitamin D, Ergocalciferol, (DRISDOL) 1.25 MG (50000 UNIT) CAPS capsule, Take 1 capsule (50,000 Units total) by mouth once a week., Disp: 4 capsule, Rfl: 3   ALPRAZolam (XANAX) 0.5 MG tablet, Take by mouth., Disp: , Rfl:    glipiZIDE (GLUCOTROL XL) 5 MG 24 hr tablet, TAKE 1 TABLET ONCE DAILY WITH BREAKFAST., Disp: 30 tablet, Rfl: 2   sacubitril-valsartan (ENTRESTO) 49-51 MG, Take 1 tablet by mouth 2 (two) times daily., Disp: 180 tablet, Rfl: 0   spironolactone (ALDACTONE) 25 MG tablet, Take 1 tablet (25 mg total) by mouth daily., Disp: 30 tablet, Rfl: 0  Orders Placed This Encounter  Procedures   Basic metabolic panel   Magnesium   Pro b natriuretic peptide (BNP)    Patient Instructions  Start taking spironolactone 25 mg p.o. daily. A week after starting spironolactone please get labs done.  I have also sent in a prescription for Entresto 49/51 mg p.o. twice daily.  If you have trouble picking this up please call the office so either patient assistance or prior authorization form to  be filled.  I will let you know when to start Entresto.  Do not start spironolactone and Entresto at the same time.   --Continue cardiac medications as reconciled in final medication list. --Return in about 4 weeks (around 07/12/2021) for Follow up, heart failure management.. Or sooner if needed. --Continue follow-up with your primary care physician regarding the management of your other chronic comorbid conditions.  Patient's questions and concerns were addressed to his satisfaction. He voices understanding of the instructions provided during this encounter.   This note was created using a voice recognition software as a result there may be grammatical errors inadvertently enclosed that do not reflect the nature of this encounter. Every attempt is made to correct such errors.  Rex Kras, [place], [facility]  Pager: [phone] Office: [phone]

## 2021-06-14 NOTE — Patient Instructions (Signed)
Start taking spironolactone 25 mg p.o. daily. A week after starting spironolactone please get labs done.  I have also sent in a prescription for Entresto 49/51 mg p.o. twice daily.  If you have trouble picking this up please call the office so either patient assistance or prior authorization form to be filled.  I will let you know when to start Entresto.  Do not start spironolactone and Entresto at the same time.

## 2021-06-22 ENCOUNTER — Telehealth: Payer: Self-pay

## 2021-06-22 NOTE — Telephone Encounter (Signed)
VM [id number]  Patient called and left a message on VM.  Message is as follows:   "yes I was calling for Dr. Odis Hollingshead, I came to see him Monday and he told me if I didn't get my medication in about a day to call him my name is Aaron Potter 06/05/1960 and it was another one that need to be pre authorization. okay he just told me to call them in if I hadn't got it. if you need to call me back my number is [phone] thank you."  I called patient back and he does not know the medications you wanted wanted him to start and stop. I explained that the only medication authorization request we had was for [facility], but patient stated that he is no longer taking that. He then said that you wanted him to start 2 new medications, but after looking in the chart instructions and speaking to patient, I was little confused.      So to clarify, he IS to take spironolactone, but not with Entresto at the same time. Correct? Then have labs drawn after a 1wk? Please advise.

## 2021-06-23 ENCOUNTER — Ambulatory Visit: Payer: Medicaid Other | Admitting: Cardiology

## 2021-06-23 NOTE — Telephone Encounter (Signed)
We spoke about him today.  ST

## 2021-06-24 NOTE — Telephone Encounter (Signed)
I called patient back, he is aware of medication instructions.

## 2021-06-30 ENCOUNTER — Telehealth: Payer: Self-pay | Admitting: Adult Health

## 2021-06-30 ENCOUNTER — Other Ambulatory Visit: Payer: Self-pay | Admitting: Cardiology

## 2021-06-30 ENCOUNTER — Other Ambulatory Visit: Payer: Self-pay | Admitting: "Endocrinology

## 2021-06-30 ENCOUNTER — Telehealth: Payer: Self-pay

## 2021-06-30 DIAGNOSIS — I5032 Chronic diastolic (congestive) heart failure: Secondary | ICD-10-CM

## 2021-06-30 NOTE — Telephone Encounter (Signed)
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   She called about a PA needing to be started on his PRAVASTATIN.

## 2021-07-01 ENCOUNTER — Telehealth: Payer: Self-pay | Admitting: Adult Health

## 2021-07-01 NOTE — Telephone Encounter (Signed)
Called patient and told him that he needs to contact [facility] and ask them if they need any paperwork for the bipap machine then they need to fax [place]. But I also advised pt to call and talk to them and determine if he needs to do something on his end for his machine. Nothing further needed

## 2021-07-01 NOTE — Telephone Encounter (Signed)
He would need to contact [employer]. We don't do the authorizations for the DME companies they do.

## 2021-07-01 NOTE — Telephone Encounter (Signed)
Called patient and he states that his bipap machine is needing prior auth. Tried to call [facility] but could not get anyone to pick up.  Can you ladies tell me what needs to be done for this bipap machine

## 2021-07-01 NOTE — Telephone Encounter (Signed)
Will hold in triage since it is after 5pm.

## 2021-07-02 NOTE — Telephone Encounter (Signed)
Called [employer] and spoke with a rep. She stated that they need the original sleep study order as well as the most recent face to face notes for the his machine. They have the face to face notes for his supplies. Fax number is [phone]. I advised her that I would fax the documentation.   Nothing further needed at time of call.

## 2021-07-02 NOTE — Telephone Encounter (Signed)
The pravastatin is being filled by his PCP so they will need to start one not [place]

## 2021-07-02 NOTE — Telephone Encounter (Signed)
Will get started on the PA for pravastatin.

## 2021-07-13 ENCOUNTER — Other Ambulatory Visit: Payer: Self-pay

## 2021-07-13 ENCOUNTER — Ambulatory Visit: Payer: Medicaid Other | Admitting: Cardiology

## 2021-07-13 ENCOUNTER — Encounter: Payer: Self-pay | Admitting: "Endocrinology

## 2021-07-13 DIAGNOSIS — I5032 Chronic diastolic (congestive) heart failure: Secondary | ICD-10-CM

## 2021-07-13 LAB — HM DIABETES EYE EXAM

## 2021-07-13 MED ORDER — SACUBITRIL-VALSARTAN 49-51 MG PO TABS: 1.0000 | ORAL_TABLET | Freq: Two times a day (BID) | ORAL | 0 refills | Status: AC

## 2021-07-14 ENCOUNTER — Ambulatory Visit: Payer: Medicaid Other | Admitting: Cardiology

## 2021-07-20 ENCOUNTER — Telehealth: Payer: Self-pay | Admitting: Adult Health

## 2021-07-21 NOTE — Telephone Encounter (Signed)
Called and told patient that [facility] wanted original sleep study and we faxed that to them on 07/02/21. And that we have not heard anything else from them. I advised patient that he call them to see about his Bipap machine. Nothing further needed

## 2021-07-27 ENCOUNTER — Ambulatory Visit: Payer: Medicaid Other | Admitting: Cardiology

## 2021-08-02 ENCOUNTER — Other Ambulatory Visit: Payer: Self-pay | Admitting: Cardiology

## 2021-08-02 ENCOUNTER — Telehealth: Payer: Self-pay | Admitting: Adult Health

## 2021-08-02 ENCOUNTER — Other Ambulatory Visit: Payer: Self-pay | Admitting: "Endocrinology

## 2021-08-02 DIAGNOSIS — I5032 Chronic diastolic (congestive) heart failure: Secondary | ICD-10-CM

## 2021-08-02 NOTE — Telephone Encounter (Signed)
Spoke with the pt  He states that [place] told him that they never received sleep study and note  They have the BIPAP order  They need ov note prior to sleep study and also the sleep study faxed  I faxed this to them at [phone] Pt aware and nothing further needed

## 2021-08-05 ENCOUNTER — Telehealth: Payer: Self-pay | Admitting: Adult Health

## 2021-08-16 ENCOUNTER — Emergency Department (HOSPITAL_COMMUNITY)
Admission: EM | Admit: 2021-08-16 | Discharge: 2021-08-16 | Disposition: A | Discharge status: Home / Self Care | Payer: Medicaid Other | Attending: Emergency Medicine | Admitting: Emergency Medicine

## 2021-08-16 ENCOUNTER — Encounter (HOSPITAL_COMMUNITY): Payer: Self-pay

## 2021-08-16 ENCOUNTER — Other Ambulatory Visit: Payer: Self-pay

## 2021-08-16 DIAGNOSIS — J449 Chronic obstructive pulmonary disease, unspecified: Secondary | ICD-10-CM | POA: Insufficient documentation

## 2021-08-16 DIAGNOSIS — E119 Type 2 diabetes mellitus without complications: Secondary | ICD-10-CM | POA: Insufficient documentation

## 2021-08-16 DIAGNOSIS — Z7984 Long term (current) use of oral hypoglycemic drugs: Secondary | ICD-10-CM | POA: Insufficient documentation

## 2021-08-16 DIAGNOSIS — R300 Dysuria: Secondary | ICD-10-CM | POA: Diagnosis present

## 2021-08-16 DIAGNOSIS — N390 Urinary tract infection, site not specified: Secondary | ICD-10-CM | POA: Insufficient documentation

## 2021-08-16 LAB — URINALYSIS, ROUTINE W REFLEX MICROSCOPIC
Bilirubin Urine: NEGATIVE
Glucose, UA: NEGATIVE mg/dL
Ketones, ur: 20 mg/dL — AB
Nitrite: NEGATIVE
Protein, ur: 100 mg/dL — AB
Specific Gravity, Urine: 1.025 (ref 1.005–1.030)
pH: 5 (ref 5.0–8.0)

## 2021-08-16 MED ORDER — ALUM & MAG HYDROXIDE-SIMETH 200-200-20 MG/5ML PO SUSP
30.0000 mL | Freq: Once | ORAL | Status: AC
  Administered 2021-08-16: 30 mL via ORAL
  Filled 2021-08-16: qty 30

## 2021-08-16 MED ORDER — CEPHALEXIN 500 MG PO CAPS: 500.0000 mg | ORAL_CAPSULE | Freq: Four times a day (QID) | ORAL | 0 refills | Status: DC

## 2021-08-16 NOTE — ED Triage Notes (Signed)
Patient complaining of dysuria and increased frequency for the past 1.5 days.

## 2021-08-16 NOTE — ED Notes (Signed)
Pt was bladder scanned, 109ml was shown, two bladder scanners were used. Pt states he doesn't feel like he has to go.

## 2021-08-16 NOTE — Discharge Instructions (Signed)
You were diagnosed today with a urinary tract infection.  I prescribed an antibiotic.  Please follow-up with your primary care provider.  If you become unable to urinate at all, return to the emergency department for further evaluation

## 2021-08-16 NOTE — ED Provider Notes (Signed)
[facility] EMERGENCY DEPARTMENT Provider Note   CSN: [id number] Arrival date & time: 08/16/21  1025     History  Chief Complaint  Patient presents with   Dysuria    Aaron Potter is a 61 y.o. male.  Patient presents to the hospital complaining of dysuria and difficulty with urination.  Patient states he started feeling some symptoms of dysuria on Saturday.  He states that on Sunday he began to have urinary frequency and during the night was unable to get CPAP off in time to make it to the bathroom.  He states he wore an adult undergarment over Sunday night due to frequency.  The patient denies abdominal pain, hematuria, shortness of breath, fevers.  He does endorse a history of previous urinary tract infections and is a type II diabetic.  Other past medical history significant for BMI over 60, gout, COPD, hypertension, GERD  HPI     Home Medications Prior to Admission medications   Medication Sig Start Date End Date Taking? Authorizing Provider  cephALEXin (KEFLEX) 500 MG capsule Take 1 capsule (500 mg total) by mouth 4 (four) times daily. 08/16/21  Yes Creedon Danielski, Fritz Pickerel B, PA-C  ACCU-CHEK AVIVA PLUS test strip USE TO CHECK BLOOD SUGAR 4 TIMES DAILY. 01/29/21   Cassandria Anger, MD  Accu-Chek FastClix Lancets MISC USE TO CHECK BLOOD SUGAR FOUR TIMES A DAY. 06/29/20   Cassandria Anger, MD  albuterol (PROVENTIL) (2.5 MG/3ML) 0.083% nebulizer solution Take 3 mLs (2.5 mg total) by nebulization every 6 (six) hours as needed for wheezing or shortness of breath. 03/27/19   Corum, Rex Kras, MD  albuterol (VENTOLIN HFA) 108 (90 Base) MCG/ACT inhaler Inhale 2 puffs into the lungs every 6 (six) hours as needed for wheezing or shortness of breath. 06/03/21   Parrett, Fonnie Mu, NP  allopurinol (ZYLOPRIM) 300 MG tablet Take 300 mg by mouth daily.      [provider]  ALPRAZolam Duanne Moron) 0.5 MG tablet Take 1 tablet by mouth 2 (two) times daily as needed for anxiety.    [provider]  Blood Glucose Monitoring Suppl (ACCU-CHEK GUIDE) w/Device KIT 1 Piece by Does not apply route as directed. 01/22/19   Cassandria Anger, MD  bumetanide (BUMEX) 1 MG tablet Take 1 tablet (1 mg total) by mouth daily. 04/09/21 04/09/22  Manuella Ghazi, Pratik D, DO  BYDUREON BCISE 2 MG/0.85ML AUIJ INJECT INTO THE SKIN ONCE A WEEK. 08/02/21   Cassandria Anger, MD  cetirizine (ZYRTEC) 10 MG tablet Take 10 mg by mouth daily. 04/02/21   [provider]  diclofenac Sodium (VOLTAREN) 1 % GEL Apply 2 g topically 4 (four) times daily as needed (pain).    [provider]  glipiZIDE (GLUCOTROL XL) 5 MG 24 hr tablet TAKE 1 TABLET ONCE DAILY WITH BREAKFAST. 06/03/21   Nida, Marella Chimes, MD  GLOBAL EASE INJECT PEN NEEDLES 31G X 8 MM MISC USE 4 TIMES DAILY. 03/02/21   Cassandria Anger, MD  insulin regular human CONCENTRATED (HUMULIN R U-500 KWIKPEN) 500 UNIT/ML KwikPen Inject 80-110 Units into the skin 3 (three) times daily with meals. 06/03/21   Cassandria Anger, MD  metFORMIN (GLUCOPHAGE) 500 MG tablet TAKE 2 TABLETS BY MOUTH TWICE DAILY. 06/01/21   Cassandria Anger, MD  Multiple Vitamin (MULTIVITAMIN WITH MINERALS) TABS tablet Take 1 tablet by mouth daily.    [provider]  oxyCODONE-acetaminophen (PERCOCET) 7.5-325 MG tablet Take 1 tablet by mouth every 6 (six) hours as needed  for severe pain.    [provider]  pantoprazole (PROTONIX) 40 MG tablet TAKE 1 TABLET DAILY, 30 MINUTES BEFORE BREAKFAST ` Patient taking differently: Take 40 mg by mouth daily. 03/30/21   Annitta Needs, NP  pravastatin (PRAVACHOL) 40 MG tablet Take 40 mg by mouth daily.    [provider]  sacubitril-valsartan (ENTRESTO) 49-51 MG Take 1 tablet by mouth 2 (two) times daily. 07/13/21   Tolia, Sunit, DO  silver sulfADIAZINE (SILVADENE) 1 % cream Apply 1 application  topically daily. 06/02/21   [provider]  spironolactone (ALDACTONE) 25 MG tablet TAKE 1 TABLET ONCE DAILY.  08/02/21   Tolia, Sunit, DO  verapamil (CALAN-SR) 120 MG CR tablet Take 1 tablet (120 mg total) by mouth daily. 04/10/21 06/14/21  Manuella Ghazi, Pratik D, DO  Vitamin D, Ergocalciferol, (DRISDOL) 1.25 MG (50000 UNIT) CAPS capsule Take 1 capsule (50,000 Units total) by mouth once a week. 04/01/19   Corum, Rex Kras, MD      Allergies    Biaxin [clarithromycin] and Ace inhibitors    Review of Systems   Review of Systems  Constitutional:  Negative for fever.  Respiratory:  Negative for shortness of breath.   Gastrointestinal:  Negative for abdominal pain and nausea.  Genitourinary:  Positive for dysuria and frequency.    Physical Exam Updated Vital Signs BP (!) 119/52   Pulse 91   Temp 98.3 F (36.8 C)   Resp (!) 22   Wt (!) 198.7 kg   SpO2 93%   BMI 64.68 kg/m  Physical Exam Vitals and nursing note reviewed.  Constitutional:      Appearance: He is obese.  HENT:     Head: Normocephalic and atraumatic.     Mouth/Throat:     Mouth: Mucous membranes are moist.  Eyes:     Conjunctiva/sclera: Conjunctivae normal.  Cardiovascular:     Rate and Rhythm: Normal rate.  Pulmonary:     Effort: Pulmonary effort is normal.  Abdominal:     Palpations: Abdomen is soft.     Tenderness: There is no abdominal tenderness. There is no right CVA tenderness or left CVA tenderness.  Musculoskeletal:        General: Normal range of motion.     Cervical back: Normal range of motion and neck supple.  Skin:    General: Skin is warm and dry.     Capillary Refill: Capillary refill takes less than 2 seconds.  Neurological:     Mental Status: He is alert and oriented to person, place, and time.     ED Results / Procedures / Treatments   Labs (all labs ordered are listed, but only abnormal results are displayed) Labs Reviewed  URINALYSIS, ROUTINE W REFLEX MICROSCOPIC - Abnormal; Notable for the following components:      Result Value   Color, Urine AMBER (*)    APPearance CLOUDY (*)    Hgb urine dipstick  MODERATE (*)    Ketones, ur 20 (*)    Protein, ur 100 (*)    Leukocytes,Ua MODERATE (*)    Bacteria, UA RARE (*)    All other components within normal limits  URINE CULTURE    EKG None  Radiology No results found.  Procedures Procedures    Medications Ordered in ED Medications  alum & mag hydroxide-simeth (MAALOX/MYLANTA) 200-200-20 MG/5ML suspension 30 mL (30 mLs Oral Given 08/16/21 1205)    ED Course/ Medical Decision Making/ A&P  Medical Decision Making Amount and/or Complexity of Data Reviewed Labs: ordered.  Risk OTC drugs.   Patient presents with a chief complaint of urinary frequency and dysuria.  Differential includes urinary tract infection, pyelonephritis, nephrolithiasis, and others  I reviewed patient's medical history including office visits for management of his type 2 diabetes  I ordered and reviewed labs including urinalysis.  Urinalysis with cloudy appearance, moderate hemoglobin, 20 ketones, 100 protein, moderate leukocytes, rare bacteria.  Mildly concerning for possible infection.  Bladder scan was ordered and patient has no residual volume at this time.  No concern about urinary retention.  Plan to discharge patient home at this time with prescription for antibiotics for UTI coverage.  Patient to follow-up with primary care for further evaluation and management.  I did order the patient Maalox while here in the hospital for reflux symptoms.  The patient did improve upon reassessment.  There is no indication for admission.  Patient may discharge home at this time        Final Clinical Impression(s) / ED Diagnoses Final diagnoses:  Urinary tract infection with hematuria, site unspecified    Rx / DC Orders ED Discharge Orders          Ordered    cephALEXin (KEFLEX) 500 MG capsule  4 times daily        08/16/21 1308              Ronny Bacon 08/16/21 1309    Milton Ferguson, MD 08/18/21 1152

## 2021-08-18 NOTE — Telephone Encounter (Signed)
Seems like encounter was open in error so closing encounter.  

## 2021-08-19 ENCOUNTER — Ambulatory Visit: Payer: Medicaid Other | Admitting: "Endocrinology

## 2021-08-20 LAB — URINE CULTURE: Culture: >=100000 — AB

## 2021-08-21 ENCOUNTER — Telehealth (HOSPITAL_BASED_OUTPATIENT_CLINIC_OR_DEPARTMENT_OTHER): Payer: Self-pay | Admitting: *Deleted

## 2021-08-21 NOTE — Telephone Encounter (Signed)
Post ED Visit - Positive Culture Follow-up  Culture report reviewed by antimicrobial stewardship pharmacist: [facility] []  , Pharm.D. []  Enzo Bi, Pharm.D., BCPS AQ-ID []  , Pharm.D., BCPS []  Celedonio Miyamoto, Pharm.D., BCPS []  [place], Garvin Fila.D., BCPS, AAHIVP []  , Pharm.D., BCPS, AAHIVP []  Georgina Pillion, PharmD, BCPS []  , PharmD, BCPS []  [place], PharmD, BCPS []  [place], PharmD []  , PharmD, BCPS [x]  Estella Husk, PharmD  Pharmacy Team []  Lysle Pearl, PharmD []  , PharmD []  Phillips Climes, PharmD []  , Rph []  Agapito Games) , PharmD []  Verlan Friends, PharmD []  , PharmD []  Mervyn Gay, PharmD []  , PharmD []  Daylene Posey, PharmD []  [facility], PharmD []  , PharmD []  Len Childs, PharmD   Positive urine culture Treated with Cephalexin, organism sensitive to the same and no further patient follow-up is required at this time.  08/21/2021, 10:28 AM

## 2021-08-23 ENCOUNTER — Encounter: Payer: Self-pay | Admitting: "Endocrinology

## 2021-08-23 ENCOUNTER — Ambulatory Visit: Payer: Medicaid Other | Admitting: "Endocrinology

## 2021-08-23 VITALS — BP 116/64 | HR 64 | Ht 69.0 in | Wt >= 6400 oz

## 2021-08-23 DIAGNOSIS — Z794 Long term (current) use of insulin: Secondary | ICD-10-CM

## 2021-08-23 DIAGNOSIS — E1169 Type 2 diabetes mellitus with other specified complication: Secondary | ICD-10-CM

## 2021-08-23 DIAGNOSIS — E782 Mixed hyperlipidemia: Secondary | ICD-10-CM | POA: Diagnosis not present

## 2021-08-23 DIAGNOSIS — I10 Essential (primary) hypertension: Secondary | ICD-10-CM | POA: Diagnosis not present

## 2021-08-23 NOTE — Patient Instructions (Signed)

## 2021-08-23 NOTE — Progress Notes (Signed)
08/23/2021  Endocrinology follow-up note  Subjective:    Patient ID: Aaron Potter, male    DOB: 1961-01-03, PCP Leeanne Rio, MD   Past Medical History:  Diagnosis Date   Allergic rhinitis, unspecified    Allergy    Anxiety disorder, unspecified    Asthma    Back pain    Balanitis    Cellulitis    COPD (chronic obstructive pulmonary disease) ([place])    Diabetes mellitus without complication ([place])    Essential (primary) hypertension    Gastro-esophageal reflux disease with esophagitis    Gout    Gout, unspecified    Hypertension    Lichen sclerosus    Mixed hyperlipidemia    Morbid (severe) obesity with alveolar hypoventilation (HCC)    Obesity, morbid (more than 100 lbs over ideal weight or BMI > 40) (HCC)    Obesity, unspecified    Polyosteoarthritis, unspecified    Sleep apnea    Sleep apnea, unspecified    CPAP   Type 2 diabetes mellitus with hyperglycemia (HCC)    Urticaria    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    Past Surgical History:  Procedure Laterality Date   CHOLECYSTECTOMY     ERCP  [place], CBD stone s/p cholecystectomy   FOOT SURGERY Right 04/2017   Social History   Socioeconomic History   Marital status: Single    Spouse name: Not on file   Number of children: 0   Years of education: Not on file   Highest education level: Not on file  Occupational History   Not on file  Tobacco Use   Smoking status: Never   Smokeless tobacco: Never  Vaping Use   Vaping Use: Never used  Substance and Sexual Activity   Alcohol use: No    Alcohol/week: 0.0 standard drinks of alcohol   Drug use: No   Sexual activity: Yes  Other Topics Concern   Not on file  Social History Narrative   Not on file   Social Determinants of Health   Financial Resource Strain: Not on file  Food Insecurity: Not on file  Transportation Needs: Not on file  Physical Activity: Not on file  Stress: Not on file  Social Connections:  Not on file   Outpatient Encounter Medications as of 08/23/2021  Medication Sig   ACCU-CHEK AVIVA PLUS test strip USE TO CHECK BLOOD SUGAR 4 TIMES DAILY.   Accu-Chek FastClix Lancets MISC USE TO CHECK BLOOD SUGAR FOUR TIMES A DAY.   albuterol (PROVENTIL) (2.5 MG/3ML) 0.083% nebulizer solution Take 3 mLs (2.5 mg total) by nebulization every 6 (six) hours as needed for wheezing or shortness of breath.   albuterol (VENTOLIN HFA) 108 (90 Base) MCG/ACT inhaler Inhale 2 puffs into the lungs every 6 (six) hours as needed for wheezing or shortness of breath.   allopurinol (ZYLOPRIM) 300 MG tablet Take 300 mg by mouth daily.     ALPRAZolam (XANAX) 0.5 MG tablet Take 1 tablet by mouth 2 (two) times daily as needed for anxiety.   Blood Glucose Monitoring Suppl (ACCU-CHEK GUIDE) w/Device KIT 1 Piece by Does not apply route as directed.   bumetanide (BUMEX) 1 MG tablet Take 1 tablet (1 mg total) by mouth daily.   BYDUREON BCISE 2 MG/0.85ML AUIJ INJECT INTO THE SKIN ONCE A WEEK.   cephALEXin (KEFLEX) 500 MG capsule Take 1 capsule (500 mg total) by mouth 4 (four) times daily.   cetirizine (ZYRTEC) 10  MG tablet Take 10 mg by mouth daily.   diclofenac Sodium (VOLTAREN) 1 % GEL Apply 2 g topically 4 (four) times daily as needed (pain).   GLOBAL EASE INJECT PEN NEEDLES 31G X 8 MM MISC USE 4 TIMES DAILY.   insulin regular human CONCENTRATED (HUMULIN R U-500 KWIKPEN) 500 UNIT/ML KwikPen Inject 80-110 Units into the skin 3 (three) times daily with meals.   metFORMIN (GLUCOPHAGE) 500 MG tablet TAKE 2 TABLETS BY MOUTH TWICE DAILY.   Multiple Vitamin (MULTIVITAMIN WITH MINERALS) TABS tablet Take 1 tablet by mouth daily.   oxyCODONE-acetaminophen (PERCOCET) 7.5-325 MG tablet Take 1 tablet by mouth every 6 (six) hours as needed for severe pain.   pantoprazole (PROTONIX) 40 MG tablet TAKE 1 TABLET DAILY, 30 MINUTES BEFORE BREAKFAST ` (Patient taking differently: Take 40 mg by mouth daily.)   pravastatin (PRAVACHOL) 40 MG  tablet Take 40 mg by mouth daily.   sacubitril-valsartan (ENTRESTO) 49-51 MG Take 1 tablet by mouth 2 (two) times daily.   silver sulfADIAZINE (SILVADENE) 1 % cream Apply 1 application  topically daily.   spironolactone (ALDACTONE) 25 MG tablet TAKE 1 TABLET ONCE DAILY.   verapamil (CALAN-SR) 120 MG CR tablet Take 1 tablet (120 mg total) by mouth daily.   Vitamin D, Ergocalciferol, (DRISDOL) 1.25 MG (50000 UNIT) CAPS capsule Take 1 capsule (50,000 Units total) by mouth once a week.   [DISCONTINUED] glipiZIDE (GLUCOTROL XL) 5 MG 24 hr tablet TAKE 1 TABLET ONCE DAILY WITH BREAKFAST. (Patient not taking: Reported on 08/23/2021)   No facility-administered encounter medications on file as of 08/23/2021.   ALLERGIES: Allergies  Allergen Reactions   Biaxin [Clarithromycin] Anaphylaxis and Shortness Of Breath   Ace Inhibitors     D/c ACEi 05/20/2019 > changed to clonidine for pain control component   Although even in retrospect it may not be clear the ACEi contributed to the pt's symptoms, adding them back at this point or in the future would risk confusion in interpretation of non-specific respiratory symptoms to which this patient is prone ie Better not to muddy the waters here.     Wilder Glade [Dapagliflozin] Other (See Comments)   VACCINATION STATUS: Immunization History  Administered Date(s) Administered   Influenza Inj Mdck Quad Pf 10/31/2019, 11/12/2020   Influenza-Unspecified 10/18/2010, 10/18/2011, 10/18/2012, 12/11/2013, 09/11/2014, 10/13/2015, 09/06/2018   Moderna Sars-Covid-2 Vaccination 04/01/2019, 04/29/2019   PNEUMOCOCCAL CONJUGATE-20 05/08/2020   Tdap 04/13/2016    Diabetes He presents for his follow-up diabetic visit. He has type 2 diabetes mellitus. Onset time: He was diagnosed at approximate age of 61 years. His disease course has been fluctuating. There are no hypoglycemic associated symptoms. Pertinent negatives for hypoglycemia include no confusion, headaches, pallor or  seizures. Associated symptoms include polydipsia and polyuria. Pertinent negatives for diabetes include no chest pain, no fatigue, no polyphagia and no weakness. There are no hypoglycemic complications. Symptoms are improving. Diabetic complications include PVD. Risk factors for coronary artery disease include dyslipidemia, diabetes mellitus, family history, male sex, obesity and sedentary lifestyle. Current diabetic treatment includes intensive insulin program. He is compliant with treatment most of the time. His weight is fluctuating minimally. He is following a generally unhealthy diet. When asked about meal planning, he reported none. He has had a previous visit with a dietitian. He never participates in exercise. His home blood glucose trend is decreasing steadily. His breakfast blood glucose range is generally 140-180 mg/dl. His lunch blood glucose range is generally 140-180 mg/dl. His dinner blood glucose range is generally 140-180  mg/dl. His bedtime blood glucose range is generally 140-180 mg/dl. His overall blood glucose range is 140-180 mg/dl. Iona Beard presents with above target glycemic profile, did not tolerate procedure causing frequent UTI which made him to discontinue.  His stent A1c was 8%.     ) An ACE inhibitor/angiotensin II receptor blocker is being taken.  Hyperlipidemia This is a chronic problem. The current episode started more than 1 year ago. The problem is uncontrolled. Exacerbating diseases include diabetes and obesity. Associated symptoms include shortness of breath. Pertinent negatives include no chest pain or myalgias. Current antihyperlipidemic treatment includes statins. Risk factors for coronary artery disease include diabetes mellitus, dyslipidemia, hypertension, male sex, obesity and a sedentary lifestyle.  Hypertension This is a chronic problem. The current episode started more than 1 year ago. The problem is uncontrolled. Associated symptoms include shortness of breath.  Pertinent negatives include no chest pain, headaches, neck pain or palpitations. Risk factors for coronary artery disease include diabetes mellitus, dyslipidemia, obesity, male gender, sedentary lifestyle and family history. Past treatments include ACE inhibitors and calcium channel blockers. The current treatment provides no improvement. Compliance problems include diet.  Hypertensive end-organ damage includes PVD.     Review of systems  Limited as above.   Objective:    BP 116/64   Pulse 64   Ht 5' 9"  (1.753 m)   Wt (!) 440 lb 12.8 oz (199.9 kg)   BMI 65.09 kg/m   Wt Readings from Last 3 Encounters:  08/23/21 (!) 440 lb 12.8 oz (199.9 kg)  08/16/21 (!) 438 lb (198.7 kg)  06/14/21 (!) 443 lb (200.9 kg)     Physical Exam- Limited  Edematous lower extremities. Calluses, poor circulation on bilateral lower extremities.  Diminished monofilament test. Results for orders placed or performed during the hospital encounter of 08/16/21  Urine Culture   Specimen: Urine, Clean Catch  Result Value Ref Range   Specimen Description      URINE, CLEAN CATCH Performed at [facility], [address], [place], [place] [address]    Special Requests      NONE Performed at [facility], [address] [place], [place] [address]    Culture >=100,000 COLONIES/mL ESCHERICHIA COLI (A)    Report Status 08/20/2021 FINAL    Organism ID, Bacteria ESCHERICHIA COLI (A)       Susceptibility   Escherichia coli - MIC*    AMPICILLIN 4 SENSITIVE Sensitive     CEFAZOLIN <=4 SENSITIVE Sensitive     CEFEPIME <=0.12 SENSITIVE Sensitive     CEFTRIAXONE <=0.25 SENSITIVE Sensitive     CIPROFLOXACIN <=0.25 SENSITIVE Sensitive     GENTAMICIN <=1 SENSITIVE Sensitive     IMIPENEM <=0.25 SENSITIVE Sensitive     NITROFURANTOIN <=16 SENSITIVE Sensitive     TRIMETH/SULFA <=20 SENSITIVE Sensitive     AMPICILLIN/SULBACTAM <=2 SENSITIVE Sensitive     PIP/TAZO <=4 SENSITIVE Sensitive     * >=100,000 COLONIES/mL  ESCHERICHIA COLI  Urinalysis, Routine w reflex microscopic  Result Value Ref Range   Color, Urine AMBER (A) YELLOW   APPearance CLOUDY (A) CLEAR   Specific Gravity, Urine 1.025 1.005 - 1.030   pH 5.0 5.0 - 8.0   Glucose, UA NEGATIVE NEGATIVE mg/dL   Hgb urine dipstick MODERATE (A) NEGATIVE   Bilirubin Urine NEGATIVE NEGATIVE   Ketones, ur 20 (A) NEGATIVE mg/dL   Protein, ur 100 (A) NEGATIVE mg/dL   Nitrite NEGATIVE NEGATIVE   Leukocytes,Ua MODERATE (A) NEGATIVE   RBC / HPF 21-50 0 -  5 RBC/hpf   WBC, UA 21-50 0 - 5 WBC/hpf   Bacteria, UA RARE (A) NONE SEEN   Squamous Epithelial / LPF 0-5 0 - 5   WBC Clumps PRESENT    Mucus PRESENT    Diabetic Labs (most recent): Lab Results  Component Value Date   HGBA1C 8.1 (A) 01/13/2021   HGBA1C 7.7 (A) 10/06/2020   HGBA1C 8.0 (A) 06/26/2020   MICROALBUR 0.8 06/11/2018   MICROALBUR 5.3 10/15/2016   MICROALBUR 1.4 03/12/2015   Lipid Panel     Component Value Date/Time   CHOL 133 05/10/2021 0808   TRIG 100 05/10/2021 0808   HDL 39 (L) 05/10/2021 0808   CHOLHDL 3.4 05/10/2021 0808   CHOLHDL 3.8 04/01/2019 0843   VLDL 31 (H) 03/12/2015 1200   LDLCALC 75 05/10/2021 0808   LDLCALC 79 04/01/2019 0843      Latest Ref Rng & Units 06/09/2021    8:29 AM 05/10/2021    8:08 AM 04/09/2021    5:39 AM  CMP  Glucose 70 - 99 mg/dL 112  93  273   BUN 6 - 24 mg/dL 8  17  27    Creatinine 0.76 - 1.27 mg/dL 0.83  1.00  1.04   Sodium 134 - 144 mmol/L 141  142  137   Potassium 3.5 - 5.2 mmol/L 4.3  6.0  3.4   Chloride 96 - 106 mmol/L 100  102  88   CO2 20 - 29 mmol/L 26  27  36   Calcium 8.7 - 10.2 mg/dL 10.0  9.9  9.4   Total Protein 6.0 - 8.5 g/dL  6.8    Total Bilirubin 0.0 - 1.2 mg/dL  0.4    Alkaline Phos 44 - 121 IU/L  71    AST 0 - 40 IU/L  28    ALT 0 - 44 IU/L  21       Assessment & Plan:   1. Uncontrolled type 2 diabetes mellitus with diabetic peripheral angiopathy without gangrene, with long-term current use of insulin ([place])  [place]  presents with above target glycemic profile, did not tolerate procedure causing frequent UTI which made him to discontinue.  His stent A1c was 8%.    He remains at extremely high risk for more acute and chronic complications of diabetes which include CAD, CVA, CKD, retinopathy, and neuropathy. These are all discussed in detail with the patient.   Glucose logs and insulin administration records pertaining to this visit,  to be scanned into patient's records.  Recent labs reviewed. - he acknowledges that there is a room for improvement in his food and drink choices. - Suggestion is made for him to avoid simple carbohydrates  from his diet including Cakes, Sweet Desserts, Ice Cream, Soda (diet and regular), Sweet Tea, Candies, Chips, Cookies, Store Bought Juices, Alcohol , Artificial Sweeteners,  Coffee Creamer, and "Sugar-free" Products, Lemonade. This will help patient to have more stable blood glucose profile and potentially avoid unintended weight gain.  The following Lifestyle Medicine recommendations according to [facility]) were discussed and and offered to patient and he  agrees to start the journey:  A. Whole Foods, Plant-Based Nutrition comprising of fruits and vegetables, plant-based proteins, whole-grain carbohydrates was discussed in detail with the patient.   A list for source of those nutrients were also provided to the patient.  Patient will use only water or unsweetened tea for hydration. B.  The need to stay away from  risky substances including alcohol, smoking; obtaining 7 to 9 hours of restorative sleep, at least 150 minutes of moderate intensity exercise weekly, the importance of healthy social connections,  and stress management techniques were discussed. C.  A full color page of  Calorie density of various food groups per pound showing examples of each food groups was provided to the patient.   - Patient is advised to stick to a routine mealtimes  to eat 3 meals  a day and avoid unnecessary snacks ( to snack only to correct hypoglycemia).   -He did not make major changes in the recommended Plant predominant Whole Foods lifestyle nutrition.  -For now, he is advised to continue  Humulin U500 to  110 units with breakfast, 110 units with lunch, 80 units with supper  for pre-meal blood glucose readings of 90 or above milligrams per deciliter associated with strict monitoring of blood glucose 4 times a day-before meals and at bedtime.    -Patient is encouraged to call clinic for blood glucose levels less than 70 or above 300 mg /dl. -He is advised to continue Metformin 1000 mg p.o. twice daily-with breakfast and supper.    -He is also advised to continue Bydureon 2 mg subcutaneously weekly.   -He did not tolerate Wilder Glade, which he has stopped  taking already.   He is advised to stay off of glipizide for now.  -He is following with Jearld Fenton, CDE for DM education.  - Patient specific target  for A1c; LDL, HDL, Triglycerides,  were discussed in detail.  2) BP/HTN: -His blood pressure is controlled to target.  Patient admits he has not taken his blood pressure medications this morning.    He is advised to proceed with his benazepril to 10 mg p.o. daily, also has verapamil 240 mg p.o. daily for blood pressure treatment.   3) Lipids/HPL: He has uncontrolled LDL, however improving LDL at 79 from 99 .  He is advised to continue pravastatin 40 mg p.o. nightly.     Side effects and precautions discussed with him.     4)  Weight/Diet: His BMI is 48.54-OEVOJJK complicating his diabetes care.  He is a candidate for major weight loss.   He has had no significant success in weight control.  He still admits to dietary indiscretion.   CDE consult in progress, exercise, and carbohydrates information provided.  He hesitates to consider bariatric surgery. Would benefit from a pair of diabetic shoes.  I have filled out paperwork for him to get a pair  from his pharmacy.  5) Chronic Care/Health Maintenance:  -Patient  is  on ACEI/ARB and Statin medications and encouraged to continue to follow up with Ophthalmology, Podiatrist at least yearly or according to recommendations, and advised to  stay away from smoking. I have recommended yearly flu vaccine and pneumonia vaccination at least every 5 years; and  sleep for at least 7 hours a day. -This patient cannot exercise optimally due to his heavy weight and comorbidities.  - I advised patient to maintain close follow up with Leeanne Rio, MD for primary care needs.    I spent 41 minutes in the care of the patient today including review of labs from [place], Lipids, Thyroid Function, Hematology (current and previous including abstractions from other facilities); face-to-face time discussing  his blood glucose readings/logs, discussing hypoglycemia and hyperglycemia episodes and symptoms, medications doses, his options of short and long term treatment based on the latest standards of care / guidelines;  discussion  about incorporating lifestyle medicine;  and documenting the encounter. Risk reduction counseling performed per USPSTF guidelines to reduce  obesity and cardiovascular risk factors.     Please refer to Patient Instructions for Blood Glucose Monitoring and Insulin/Medications Dosing Guide"  in media tab for additional information. Please  also refer to " Patient Self Inventory" in the Media  tab for reviewed elements of pertinent patient history.  Aaron Potter participated in the discussions, expressed understanding, and voiced agreement with the above plans.  All questions were answered to his satisfaction. he is encouraged to contact clinic should he have any questions or concerns prior to his return visit.   Follow up plan: -Return in about 4 months (around 12/23/2021) for F/U with Pre-visit Labs, Meter/CGM/Logs, A1c here.  Glade Lloyd, MD Phone: [phone]  Fax: [phone]  -   This note was partially dictated with voice recognition software. Similar sounding words can be transcribed inadequately or may not  be corrected upon review.  08/23/2021, 1:35 PM

## 2021-09-01 ENCOUNTER — Other Ambulatory Visit: Payer: Self-pay | Admitting: Cardiology

## 2021-09-01 ENCOUNTER — Other Ambulatory Visit: Payer: Self-pay | Admitting: "Endocrinology

## 2021-09-01 ENCOUNTER — Other Ambulatory Visit: Payer: Self-pay | Admitting: Adult Health

## 2021-09-01 DIAGNOSIS — I5032 Chronic diastolic (congestive) heart failure: Secondary | ICD-10-CM

## 2021-09-01 DIAGNOSIS — E1165 Type 2 diabetes mellitus with hyperglycemia: Secondary | ICD-10-CM

## 2021-09-03 ENCOUNTER — Ambulatory Visit: Payer: Medicaid Other | Admitting: Adult Health

## 2021-09-29 ENCOUNTER — Ambulatory Visit: Payer: Medicaid Other | Admitting: Adult Health

## 2021-10-04 ENCOUNTER — Other Ambulatory Visit: Payer: Self-pay | Admitting: Cardiology

## 2021-10-04 ENCOUNTER — Other Ambulatory Visit: Payer: Self-pay | Admitting: "Endocrinology

## 2021-10-04 DIAGNOSIS — I5032 Chronic diastolic (congestive) heart failure: Secondary | ICD-10-CM

## 2021-11-01 ENCOUNTER — Other Ambulatory Visit: Payer: Self-pay | Admitting: Cardiology

## 2021-11-01 ENCOUNTER — Other Ambulatory Visit: Payer: Self-pay | Admitting: "Endocrinology

## 2021-11-01 DIAGNOSIS — I5032 Chronic diastolic (congestive) heart failure: Secondary | ICD-10-CM

## 2021-11-01 DIAGNOSIS — E1165 Type 2 diabetes mellitus with hyperglycemia: Secondary | ICD-10-CM

## 2021-11-03 ENCOUNTER — Other Ambulatory Visit: Payer: Self-pay | Admitting: "Endocrinology

## 2021-11-03 DIAGNOSIS — E1165 Type 2 diabetes mellitus with hyperglycemia: Secondary | ICD-10-CM

## 2021-11-29 ENCOUNTER — Other Ambulatory Visit: Payer: Self-pay | Admitting: Cardiology

## 2021-11-29 ENCOUNTER — Other Ambulatory Visit: Payer: Self-pay | Admitting: "Endocrinology

## 2021-11-29 DIAGNOSIS — I5032 Chronic diastolic (congestive) heart failure: Secondary | ICD-10-CM

## 2021-12-01 ENCOUNTER — Other Ambulatory Visit: Payer: Self-pay | Admitting: "Endocrinology

## 2021-12-23 ENCOUNTER — Ambulatory Visit: Payer: Medicaid Other | Admitting: "Endocrinology

## 2021-12-28 ENCOUNTER — Other Ambulatory Visit: Payer: Self-pay | Admitting: Cardiology

## 2021-12-28 ENCOUNTER — Other Ambulatory Visit: Payer: Self-pay | Admitting: "Endocrinology

## 2021-12-28 DIAGNOSIS — E1165 Type 2 diabetes mellitus with hyperglycemia: Secondary | ICD-10-CM

## 2021-12-28 DIAGNOSIS — I5032 Chronic diastolic (congestive) heart failure: Secondary | ICD-10-CM

## 2021-12-29 ENCOUNTER — Other Ambulatory Visit: Payer: Self-pay

## 2021-12-29 DIAGNOSIS — E1165 Type 2 diabetes mellitus with hyperglycemia: Secondary | ICD-10-CM

## 2021-12-29 MED ORDER — HUMULIN R U-500 KWIKPEN 500 UNIT/ML ~~LOC~~ SOPN: PEN_INJECTOR | SUBCUTANEOUS | 0 refills | Status: DC

## 2022-01-06 ENCOUNTER — Ambulatory Visit: Payer: Medicaid Other | Admitting: Podiatry

## 2022-01-06 VITALS — BP 163/81 | HR 71

## 2022-01-06 DIAGNOSIS — E119 Type 2 diabetes mellitus without complications: Secondary | ICD-10-CM

## 2022-01-06 DIAGNOSIS — E1149 Type 2 diabetes mellitus with other diabetic neurological complication: Secondary | ICD-10-CM

## 2022-01-06 DIAGNOSIS — T148XXA Other injury of unspecified body region, initial encounter: Secondary | ICD-10-CM

## 2022-01-06 MED ORDER — GABAPENTIN 300 MG PO CAPS: 300.0000 mg | ORAL_CAPSULE | Freq: Two times a day (BID) | ORAL | 0 refills | Status: DC

## 2022-01-06 NOTE — Patient Instructions (Addendum)
Start with gabapentin 300mg  at night. After 1 week, if no side affects, then go up to one tablet two times a day.   Gabapentin Capsules or Tablets What is this medication? GABAPENTIN (GA ba pen tin) treats nerve pain. It may also be used to prevent and control seizures in people with epilepsy. It works by calming overactive nerves in your body. This medicine may be used for other purposes; ask your health care provider or pharmacist if you have questions. COMMON BRAND NAME(S): Active-PAC with Gabapentin, , Gralise, Neurontin What should I tell my care team before I take this medication? They need to know if you have any of these conditions: Alcohol or substance use disorder Kidney disease Lung or breathing disease Suicidal thoughts, plans, or attempt; a previous suicide attempt by you or a family member An unusual or allergic reaction to gabapentin, other medications, foods, dyes, or preservatives Pregnant or trying to get pregnant Breast-feeding How should I use this medication? Take this medication by mouth with a glass of water. Follow the directions on the prescription label. You can take it with or without food. If it upsets your stomach, take it with food. Take your medication at regular intervals. Do not take it more often than directed. Do not stop taking except on your care team's advice. If you are directed to break the 600 or 800 mg tablets in half as part of your dose, the extra half tablet should be used for the next dose. If you have not used the extra half tablet within 28 days, it should be thrown away. A special MedGuide will be given to you by the pharmacist with each prescription and refill. Be sure to read this information carefully each time. Talk to your care team about the use of this medication in children. While this medication may be prescribed for children as young as 3 years for selected conditions, precautions do apply. Overdosage: If you think you have taken  too much of this medicine contact a poison control center or emergency room at once. NOTE: This medicine is only for you. Do not share this medicine with others. What if I miss a dose? If you miss a dose, take it as soon as you can. If it is almost time for your next dose, take only that dose. Do not take double or extra doses. What may interact with this medication? Alcohol Antihistamines for allergy, cough, and cold Certain medications for anxiety or sleep Certain medications for depression like amitriptyline, fluoxetine, sertraline Certain medications for seizures like phenobarbital, primidone Certain medications for stomach problems General anesthetics like halothane, isoflurane, methoxyflurane, propofol Local anesthetics like lidocaine, pramoxine, tetracaine Medications that relax muscles for surgery Opioid medications for pain Phenothiazines like chlorpromazine, mesoridazine, prochlorperazine, thioridazine This list may not describe all possible interactions. Give your health care provider a list of all the medicines, herbs, non-prescription drugs, or dietary supplements you use. Also tell them if you smoke, drink alcohol, or use illegal drugs. Some items may interact with your medicine. What should I watch for while using this medication? Visit your care team for regular checks on your progress. You may want to keep a record at home of how you feel your condition is responding to treatment. You may want to share this information with your care team at each visit. You should contact your care team if your seizures get worse or if you have any new types of seizures. Do not stop taking this medication or any of your  seizure medications unless instructed by your care team. Stopping your medication suddenly can increase your seizures or their severity. This medication may cause serious skin reactions. They can happen weeks to months after starting the medication. Contact your care team right  away if you notice fevers or flu-like symptoms with a rash. The rash may be red or purple and then turn into blisters or peeling of the skin. Or, you might notice a red rash with swelling of the face, lips or lymph nodes in your neck or under your arms. Wear a medical identification bracelet or chain if you are taking this medication for seizures. Carry a card that lists all your medications. This medication may affect your coordination, reaction time, or judgment. Do not drive or operate machinery until you know how this medication affects you. Sit up or stand slowly to reduce the risk of dizzy or fainting spells. Drinking alcohol with this medication can increase the risk of these side effects. Your mouth may get dry. Chewing sugarless gum or sucking hard candy, and drinking plenty of water may help. Watch for new or worsening thoughts of suicide or depression. This includes sudden changes in mood, behaviors, or thoughts. These changes can happen at any time but are more common in the beginning of treatment or after a change in dose. Call your care team right away if you experience these thoughts or worsening depression. If you become pregnant while using this medication, you may enroll in the [place] Pregnancy Registry by calling [phone]. This registry collects information about the safety of antiepileptic medication use during pregnancy. What side effects may I notice from receiving this medication? Side effects that you should report to your care team as soon as possible: Allergic reactions or angioedema--skin rash, itching, hives, swelling of the face, eyes, lips, tongue, arms, or legs, trouble swallowing or breathing Rash, fever, and swollen lymph nodes Thoughts of suicide or self harm, worsening mood, feelings of depression Trouble breathing Unusual changes in mood or behavior in children after use such as difficulty concentrating, hostility, or restlessness Side  effects that usually do not require medical attention (report to your care team if they continue or are bothersome): Dizziness Drowsiness Nausea Swelling of ankles, feet, or hands Vomiting This list may not describe all possible side effects. Call your doctor for medical advice about side effects. You may report side effects to FDA at 1-800-FDA-1088. Where should I keep my medication? Keep out of reach of children and pets. Store at room temperature between 15 and 30 degrees C (59 and 86 degrees F). Get rid of any unused medication after the expiration date. This medication may cause accidental overdose and death if taken by other adults, children, or pets. To get rid of medications that are no longer needed or have expired: Take the medication to a medication take-back program. Check with your pharmacy or law enforcement to find a location. If you cannot return the medication, check the label or package insert to see if the medication should be thrown out in the garbage or flushed down the toilet. If you are not sure, ask your care team. If it is safe to put it in the trash, empty the medication out of the container. Mix the medication with cat litter, dirt, coffee grounds, or other unwanted substance. Seal the mixture in a bag or container. Put it in the trash. NOTE: This sheet is a summary. It may not cover all possible information. If you have questions  about this medicine, talk to your doctor, pharmacist, or health care provider.  2023 Elsevier/Gold Standard (2020-06-22 00:00:00)

## 2022-01-06 NOTE — Progress Notes (Signed)
Subjective:   Patient ID: Aaron Potter, male   DOB: 62 y.o.   MRN: [id number]   HPI Chief Complaint  Patient presents with   Diabetes    Diabetic foot care, A1c-8.1 Neuropathy, started 1 year ago, Burning at night time, stinging, numbness TX: Gabapentin(stop taking)     62 year old male presents with above concerns.  He was on gabapentin about 3-4 years ago. It was not as bad then but he was not sure if it was helping or not. Now his symptoms are worse. He gets burning, stinging at night. If he puts his cover over his toes at night it gets hot. He gets numbness under his feet as well.   He bumpted his right 5th toe on the corner and noticed bleeding. He cleaned it and put silvadene cream on it. He soaked in epsom salts.   He feet use to swell but he changed medications and that helped.   No history of ulcerations.   No claudication symptoms.    Review of Systems  All other systems reviewed and are negative.  Past Medical History:  Diagnosis Date   Allergic rhinitis, unspecified    Allergy    Anxiety disorder, unspecified    Asthma    Back pain    Balanitis    Cellulitis    COPD (chronic obstructive pulmonary disease) (HCC)    Diabetes mellitus without complication ([place])    Essential (primary) hypertension    Gastro-esophageal reflux disease with esophagitis    Gout    Gout, unspecified    Hypertension    Lichen sclerosus    Mixed hyperlipidemia    Morbid (severe) obesity with alveolar hypoventilation (HCC)    Obesity, morbid (more than 100 lbs over ideal weight or BMI > 40) (HCC)    Obesity, unspecified    Polyosteoarthritis, unspecified    Sleep apnea    Sleep apnea, unspecified    CPAP   Type 2 diabetes mellitus with hyperglycemia (HCC)    Urticaria    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     Past Surgical History:  Procedure Laterality Date   CHOLECYSTECTOMY     ERCP  [place], CBD stone s/p cholecystectomy   FOOT  SURGERY Right 04/2017     Current Outpatient Medications:    ACCU-CHEK AVIVA PLUS test strip, USE TO CHECK BLOOD SUGAR 4 TIMES DAILY., Disp: 400 strip, Rfl: 2   Accu-Chek FastClix Lancets MISC, USE TO CHECK BLOOD SUGAR FOUR TIMES A DAY., Disp: 102 each, Rfl: 2   albuterol (PROVENTIL) (2.5 MG/3ML) 0.083% nebulizer solution, Take 3 mLs (2.5 mg total) by nebulization every 6 (six) hours as needed for wheezing or shortness of breath., Disp: 150 mL, Rfl: 1   allopurinol (ZYLOPRIM) 300 MG tablet, Take 300 mg by mouth daily.  , Disp: , Rfl:    ALPRAZolam (XANAX) 0.5 MG tablet, Take 1 tablet by mouth 2 (two) times daily as needed for anxiety., Disp: , Rfl:    Blood Glucose Monitoring Suppl (ACCU-CHEK GUIDE) w/Device KIT, 1 Piece by Does not apply route as directed., Disp: 1 kit, Rfl: 0   bumetanide (BUMEX) 1 MG tablet, Take 1 tablet (1 mg total) by mouth daily., Disp: 30 tablet, Rfl: 11   BYDUREON BCISE 2 MG/0.85ML AUIJ, INJECT INTO THE SKIN ONCE A WEEK., Disp: 3.4 mL, Rfl: 0   cephALEXin (KEFLEX) 500 MG capsule, Take 1 capsule (500 mg total) by mouth 4 (four) times daily. (Patient  not taking: Reported on 01/06/2022), Disp: 28 capsule, Rfl: 0   cetirizine (ZYRTEC) 10 MG tablet, Take 10 mg by mouth daily., Disp: , Rfl:    diclofenac Sodium (VOLTAREN) 1 % GEL, Apply 2 g topically 4 (four) times daily as needed (pain)., Disp: , Rfl:    GLOBAL EASE INJECT PEN NEEDLES 31G X 8 MM MISC, USE 4 TIMES DAILY., Disp: 100 each, Rfl: 0   HUMULIN R U-500 KWIKPEN 500 UNIT/ML KwikPen, INJECT 80-110 UNITS INTO THE SKIN 3 TIMES DAILY WITH MEALS., Disp: 18 mL, Rfl: 0   insulin regular human CONCENTRATED (HUMULIN R U-500 KWIKPEN) 500 UNIT/ML KwikPen, INJECT 80-110 UNITS INTO THE SKIN 3 TIMES DAILY WITH MEALS., Disp: 54 mL, Rfl: 0   metFORMIN (GLUCOPHAGE) 500 MG tablet, TAKE 2 TABLETS BY MOUTH TWICE DAILY., Disp: 120 tablet, Rfl: 0   Multiple Vitamin (MULTIVITAMIN WITH MINERALS) TABS tablet, Take 1 tablet by mouth daily., Disp:  , Rfl:    oxyCODONE-acetaminophen (PERCOCET) 7.5-325 MG tablet, Take 1 tablet by mouth every 6 (six) hours as needed for severe pain., Disp: , Rfl:    pantoprazole (PROTONIX) 40 MG tablet, TAKE 1 TABLET DAILY, 30 MINUTES BEFORE BREAKFAST ` (Patient taking differently: Take 40 mg by mouth daily.), Disp: 90 tablet, Rfl: 3   pravastatin (PRAVACHOL) 40 MG tablet, Take 40 mg by mouth daily., Disp: , Rfl:    sacubitril-valsartan (ENTRESTO) 49-51 MG, Take 1 tablet by mouth 2 (two) times daily., Disp: 180 tablet, Rfl: 0   silver sulfADIAZINE (SILVADENE) 1 % cream, Apply 1 application  topically daily., Disp: , Rfl:    spironolactone (ALDACTONE) 25 MG tablet, TAKE 1 TABLET ONCE DAILY., Disp: 30 tablet, Rfl: 0   VENTOLIN HFA 108 (90 Base) MCG/ACT inhaler, 2 PUFFS INTO THE LUNGS EVERY 6 HOURS AS NEEDED FOR WHEEZING OR SHORTNESS OF BREATH., Disp: 18 g, Rfl: 0   verapamil (CALAN-SR) 120 MG CR tablet, Take 1 tablet (120 mg total) by mouth daily., Disp: 30 tablet, Rfl: 0   Vitamin D, Ergocalciferol, 50000 units CAPS, TAKE 1 CAPSULE BY MOUTH ONCE A WEEK., Disp: 4 capsule, Rfl: 0  Allergies  Allergen Reactions   Biaxin [Clarithromycin] Anaphylaxis and Shortness Of Breath   Ace Inhibitors     D/c ACEi 05/20/2019 > changed to clonidine for pain control component   Although even in retrospect it may not be clear the ACEi contributed to the pt's symptoms, adding them back at this point or in the future would risk confusion in interpretation of non-specific respiratory symptoms to which this patient is prone ie Better not to muddy the waters here.     Wilder Glade [Dapagliflozin] Other (See Comments)          Objective:  Physical Exam  General: AAO x3, NAD  Dermatological: Small superficial abrasion noted on the right fifth toe without any edema, erythema or signs of infection.   Vascular: Dorsalis Pedis artery and Posterior Tibial artery pedal pulses are palpable bilateral with immedate capillary fill time.  Pedal hair growth present. No varicosities and no lower extremity edema present bilateral. There is no pain with calf compression, swelling, warmth, erythema.   Neruologic: Senation decreased with SWMF.   Musculoskeletal: No pain on the fifth toe where he has the abrasion.  No other areas of discomfort.  Gait: Unassisted, Nonantalgic.       Assessment:   62 year old male with symptomatic onychomycosis, neuropathy     Plan:  -Treatment options discussed including all alternatives, risks, and complications -Etiology  of symptoms were discussed -Given his Symptoms he was to go back on gabapentin.  Will restart this.  Discussed taking it once at night.  He did not go up to twice a day as long as he does not have any side effects we discussed the side effects today. -He is understandable infection. -Daily foot inspection, glucose control.  Trula Slade DPM

## 2022-01-07 ENCOUNTER — Encounter: Payer: Self-pay | Admitting: Internal Medicine

## 2022-01-07 ENCOUNTER — Ambulatory Visit: Payer: Medicaid Other | Admitting: Internal Medicine

## 2022-01-07 VITALS — BP 142/94 | HR 90 | Temp 97.6°F | Ht 69.0 in | Wt >= 6400 oz

## 2022-01-07 DIAGNOSIS — R0609 Other forms of dyspnea: Secondary | ICD-10-CM | POA: Diagnosis not present

## 2022-01-07 DIAGNOSIS — G4733 Obstructive sleep apnea (adult) (pediatric): Secondary | ICD-10-CM

## 2022-01-07 NOTE — Patient Instructions (Signed)
Only use your albuterol as a rescue medication to be used if you can't catch your breath by resting or doing a relaxed purse lip breathing pattern.  - The less you use it, the better it will work when you need it. - Ok to use up to 2 puffs  every 4 hours if you must but call for immediate appointment if use goes up over your usual need - Don't leave home without it !!  (think of it like the starter  for your car)    Also  Ok to try albuterol 15 min before an activity (on alternating days)  that you know would usually make you short of breath and see if it makes any difference and if makes none then don't take albuterol after activity unless you can't catch your breath as this means it's the resting that helps, not the albuterol.  Set up with 6 month follow up here with one of our sleep doctors

## 2022-01-07 NOTE — Progress Notes (Signed)
Aaron Potter, male    DOB: [birth year]    MRN: [id number]   Brief patient profile:  45  yowm never smoker with MO complicated by osa and asthma as child remembers being hospitalized and never able to play sports with issues with doe started to have to inhalers for asthma which helped starting in 20s and no better with spiriva but also on maint saba = neb at home and hfa if out referred to pulmonary clinic 05/20/2019 by Dr   [name]      History of Present Illness  05/20/2019  Pulmonary/ 1st office eval/[name] on acei and spiriva dpi  Chief Complaint  Patient presents with   Pulmonary Consult    Referred by Dr [name].  Former patient of Dr [name]. He states since the weather has been getting warmer his breathing has been slightly worse. He has been wheezing some and has occ cough in the am's with white to clear sputum. He uses CPAP. He uses his proair inhaler 3-4 x per wk.    Dyspnea:  100 ft down and then back to landing s stopping legs/back and breathing stop in about the same time  Cough: in am's esp but  Min productive  Sleep: on cpap 3 pillows  SABA use: avg neb twice daily "but supposed to do it 4x" and confused thoroughly with maint vs prns 02 none  rec Stop lotensin and spiriva  Clonidine 0.1 mg  One tablet twice  Plan A = Automatic = Always=   Clonidine plus your other maintenance medications Plan B = Backup (to supplement plan A, not to replace it) Only use your albuterol inhaler as a rescue medication Plan C = Crisis (instead of Plan B but only if Plan B stops working) - only use your albuterol nebulizer if you first try Plan B and it fails to help Please schedule a follow up office visit in 4 weeks, call sooner if needed with all medications /inhalers/ solutions in hand so we can verify exactly what you are taking. This includes all medications from all doctors and over the counters - PLEASE separate them into two bags:  the ones you take automatically, no matter what, vs the  ones you take just when you feel you need them "BAG #2 is UP TO YOU"  - this will really help [place] help you take your medications more effectively.       07/01/2019  f/u ov/[name] re: restrictive changes on pfts/ no obst  S/p vaccines for covid 19  / did not bring meds  Chief Complaint  Patient presents with   Follow-up    Breathing is unchanged since the last visit. No new co's today.   Dyspnea: no change Cough: none Sleeping: most nights ok on cpap ok   SABA use: never prechallenges  02: none  Rec Ok to let Dr Wyline Mood to treat your blood pressure but I would avoid ACE inhibitors at all costs because confuse the interpretation of your symptoms which are largely related to your weight   Try albuterol 15 min (either the inhaler or nebulizer but not both at same time)  before an activity that you know would make you short of breath and see if it makes any difference and if makes none then don't take it after activity unless you can't catch your breath    Please schedule a follow up office visit in 6-7  weeks, call sooner if needed - to see one of our sleep doctors  and follow up with me is as needed Add:  Would do cxr on return as none in computer   NP eval 06/03/21 Continue on Oxygen 2l/m with activity as needed to keep O2 sats >88-90%.  We will change from your CPAP over to BiPAP.  This order will be sent to your homecare company.  You will also need oxygen 2 L with your BiPAP. Wear your BiPAP all night long and with naps Work on healthy weight loss Do not drive if sleepy Healthy sleep regimen Albuterol inhaler As needed     01/07/2022  f/u ov/[facility] [facility]/[name] re: MO >> asthma  maint on bipap and prn saba   Chief Complaint  Patient presents with   Follow-up    Breathing is better only using o2 at night time   Dyspnea:  limited by back more than breathing  Cough: none  Sleeping: bipap and 02 2lpm flat bed 2 pillows and sleeps up to 8 h a night and rested SABA use: using neb up to  2 x daily  never prechallenges / rechallenges approp  02: 2lpm  hs only  Covid status: vax x 2      No obvious day to day or daytime variability or assoc excess/ purulent sputum or mucus plugs or hemoptysis or cp or chest tightness, subjective wheeze or overt sinus or hb symptoms.   Sleeping  without nocturnal  or early am exacerbation  of respiratory  c/o's or need for noct saba. Also denies any obvious fluctuation of symptoms with weather or environmental changes or other aggravating or alleviating factors except as outlined above   No unusual exposure hx or h/o childhood pna  or knowledge of premature birth.  Current Allergies, Complete Past Medical History, Past Surgical History, Family History, and Social History were reviewed in [employer] record.  ROS  The following are not active complaints unless bolded Hoarseness, sore throat, dysphagia, dental problems, itching, sneezing,  nasal congestion or discharge of excess mucus or purulent secretions, ear ache,   fever, chills, sweats, unintended wt loss or wt gain, classically pleuritic or exertional cp,  orthopnea pnd or arm/hand swelling  or leg swelling, presyncope, palpitations, abdominal pain, anorexia, nausea, vomiting, diarrhea  or change in bowel habits or change in bladder habits, change in stools or change in urine, dysuria, hematuria,  rash, arthralgias, visual complaints, headache, numbness, weakness or ataxia or problems with walking or coordination,  change in mood or  memory.        Current Meds  Medication Sig   ACCU-CHEK AVIVA PLUS test strip USE TO CHECK BLOOD SUGAR 4 TIMES DAILY.   Accu-Chek FastClix Lancets MISC USE TO CHECK BLOOD SUGAR FOUR TIMES A DAY.   albuterol (PROVENTIL) (2.5 MG/3ML) 0.083% nebulizer solution Take 3 mLs (2.5 mg total) by nebulization every 6 (six) hours as needed for wheezing or shortness of breath.   allopurinol (ZYLOPRIM) 300 MG tablet Take 300 mg by mouth daily.      ALPRAZolam (XANAX) 0.5 MG tablet Take 1 tablet by mouth 2 (two) times daily as needed for anxiety.   Blood Glucose Monitoring Suppl (ACCU-CHEK GUIDE) w/Device KIT 1 Piece by Does not apply route as directed.   bumetanide (BUMEX) 1 MG tablet Take 1 tablet (1 mg total) by mouth daily.   BYDUREON BCISE 2 MG/0.85ML AUIJ INJECT INTO THE SKIN ONCE A WEEK.   diclofenac Sodium (VOLTAREN) 1 % GEL Apply 2 g topically 4 (four) times daily as needed (pain).  gabapentin (NEURONTIN) 300 MG capsule Take 1 capsule (300 mg total) by mouth 2 (two) times daily.   GLOBAL EASE INJECT PEN NEEDLES 31G X 8 MM MISC USE 4 TIMES DAILY.   HUMULIN R U-500 KWIKPEN 500 UNIT/ML KwikPen INJECT 80-110 UNITS INTO THE SKIN 3 TIMES DAILY WITH MEALS.   insulin regular human CONCENTRATED (HUMULIN R U-500 KWIKPEN) 500 UNIT/ML KwikPen INJECT 80-110 UNITS INTO THE SKIN 3 TIMES DAILY WITH MEALS.   metFORMIN (GLUCOPHAGE) 500 MG tablet TAKE 2 TABLETS BY MOUTH TWICE DAILY.   Multiple Vitamin (MULTIVITAMIN WITH MINERALS) TABS tablet Take 1 tablet by mouth daily.   oxyCODONE-acetaminophen (PERCOCET) 7.5-325 MG tablet Take 1 tablet by mouth every 6 (six) hours as needed for severe pain.   pantoprazole (PROTONIX) 40 MG tablet TAKE 1 TABLET DAILY, 30 MINUTES BEFORE BREAKFAST ` (Patient taking differently: Take 40 mg by mouth daily.)   pravastatin (PRAVACHOL) 40 MG tablet Take 40 mg by mouth daily.   sacubitril-valsartan (ENTRESTO) 49-51 MG Take 1 tablet by mouth 2 (two) times daily.   silver sulfADIAZINE (SILVADENE) 1 % cream Apply 1 application  topically daily.   spironolactone (ALDACTONE) 25 MG tablet TAKE 1 TABLET ONCE DAILY.   VENTOLIN HFA 108 (90 Base) MCG/ACT inhaler 2 PUFFS INTO THE LUNGS EVERY 6 HOURS AS NEEDED FOR WHEEZING OR SHORTNESS OF BREATH.   Vitamin D, Ergocalciferol, 50000 units CAPS TAKE 1 CAPSULE BY MOUTH ONCE A WEEK.                     Past Medical History:  Diagnosis Date   Allergic rhinitis, unspecified    Allergy     Anxiety disorder, unspecified    Asthma    Back pain    Balanitis    Cellulitis    COPD (chronic obstructive pulmonary disease) (HCC)    Diabetes mellitus without complication (HCC)    Essential (primary) hypertension    Gastro-esophageal reflux disease with esophagitis    Gout    Gout, unspecified    Hypertension    Lichen sclerosus    Mixed hyperlipidemia    Morbid (severe) obesity with alveolar hypoventilation (HCC)    Obesity, morbid (more than 100 lbs over ideal weight or BMI > 40) (HCC)    Obesity, unspecified    Polyosteoarthritis, unspecified    Sleep apnea    Sleep apnea, unspecified    CPAP   Type 2 diabetes mellitus with hyperglycemia (HCC)    Urticaria    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        Objective:       01/07/2022            444  07/01/19 (!) 451 lb (204.6 kg)  05/22/19 (!) 459 lb 3.2 oz (208.3 kg)  05/20/19 (!) 436 lb (197.8 kg)     Vital signs reviewed  01/07/2022  - Note at rest 02 sats  96% on RA   General appearance:    MO amb wm nad        HEENT : Oropharynx  clear         NECK :  without  apparent JVD/ palpable Nodes/TM    LUNGS: no acc muscle use,  Nl contour chest which is clear to A and P bilaterally without cough on insp or exp maneuvers   CV:  RRR  no s3 or murmur or increase in P2, and  trace pitting both LEs  ABD:  soft and nontender /  massively obese   MS:  Nl gait/ ext warm without deformities Or obvious joint restrictions  calf tenderness, cyanosis or clubbing    SKIN: warm and dry without lesions    NEURO:  alert, approp, nl sensorium with  no motor or cerebellar deficits apparent.          Assessment

## 2022-01-08 NOTE — Assessment & Plan Note (Signed)
Body mass index is 65.63 kg/m.  -  trending down slightly  Lab Results  Component Value Date   TSH 7.000 (H) 05/10/2021      Contributing to doe and risk of GERD and assoc with OSA/OHS  >>>   reviewed the need and the process to achieve and maintain neg calorie balance > defer f/u primary care including intermittently monitoring thyroid status

## 2022-01-08 NOTE — Assessment & Plan Note (Signed)
Onset "asthma" as child/ never smoker - Spirometry 07/18/12   FEV1 1.90 (48%)  Ratio 0.69 with atypical curve in effort dep portion not typical of true airflow obst - trial off spiriva/ acei 05/20/2019 >>>   - PFT's  06/25/2019  FEV1 2.49 (64 % ) ratio 0.81  p 0 % improvement from saba p nothing  prior to study with DLCO  24.79 (84%) corrects to 5.57 (129%)  for alv volume and FV curve truncated exp effort dep portion and some insp truncation as well  But no true plateaus  -  ERV 16%   Advised best way to tell the difference between asthma and doe related to obesity is as follows    try albuterol 15 min before an activity (on alternating days)  that you know would usually make you short of breath and see if it makes any difference and if makes none then don't take albuterol after activity unless you can't catch your breath as this means it's the resting that helps, not the albuterol.  If finding needing more albuterol than baseline would consider trial of symbicort 80 2bid since it is on his formulary and Air Supra is not.   Pulmonary f/u is prn   Referred to Sleep medicine here for serial f/u on bipap         Each maintenance medication was reviewed in detail including emphasizing most importantly the difference between maintenance and prns and under what circumstances the prns are to be triggered using an action plan format where appropriate.  Total time for H and P, chart review, counseling, reviewing hfa/02 device(s) and generating customized AVS unique to this office visit / same day charting = 25 min

## 2022-01-11 LAB — TSH: TSH: 1.5 u[IU]/mL (ref 0.450–4.500)

## 2022-01-11 LAB — COMPREHENSIVE METABOLIC PANEL
ALT: 22 IU/L (ref 0–44)
AST: 23 IU/L (ref 0–40)
Albumin/Globulin Ratio: 1.6 (ref 1.2–2.2)
Albumin: 3.9 g/dL (ref 3.8–4.9)
Alkaline Phosphatase: 70 IU/L (ref 44–121)
BUN/Creatinine Ratio: 16 (ref 9–20)
BUN: 13 mg/dL (ref 6–24)
Bilirubin Total: 0.4 mg/dL (ref 0.0–1.2)
CO2: 23 mmol/L (ref 20–29)
Calcium: 10.1 mg/dL (ref 8.7–10.2)
Chloride: 104 mmol/L (ref 96–106)
Creatinine, Ser: 0.82 mg/dL (ref 0.76–1.27)
Globulin, Total: 2.5 g/dL (ref 1.5–4.5)
Glucose: 106 mg/dL — ABNORMAL HIGH (ref 70–99)
Potassium: 5.2 mmol/L (ref 3.5–5.2)
Sodium: 142 mmol/L (ref 134–144)
Total Protein: 6.4 g/dL (ref 6.0–8.5)
eGFR: 101 mL/min/{1.73_m2} (ref >59)

## 2022-01-11 LAB — LIPID PANEL
Chol/HDL Ratio: 2.9 ratio (ref 0.0–5.0)
Cholesterol, Total: 123 mg/dL (ref 100–199)
HDL: 42 mg/dL (ref >39)
LDL Chol Calc (NIH): 65 mg/dL (ref 0–99)
Triglycerides: 82 mg/dL (ref 0–149)
VLDL Cholesterol Cal: 16 mg/dL (ref 5–40)

## 2022-01-11 LAB — T4, FREE: Free T4: 1.09 ng/dL (ref 0.82–1.77)

## 2022-01-19 ENCOUNTER — Encounter: Payer: Self-pay | Admitting: "Endocrinology

## 2022-01-19 ENCOUNTER — Ambulatory Visit: Payer: Medicaid Other | Admitting: "Endocrinology

## 2022-01-19 VITALS — BP 110/54 | HR 80 | Ht 69.0 in | Wt >= 6400 oz

## 2022-01-19 DIAGNOSIS — E1169 Type 2 diabetes mellitus with other specified complication: Secondary | ICD-10-CM

## 2022-01-19 DIAGNOSIS — E782 Mixed hyperlipidemia: Secondary | ICD-10-CM | POA: Diagnosis not present

## 2022-01-19 DIAGNOSIS — Z794 Long term (current) use of insulin: Secondary | ICD-10-CM | POA: Diagnosis not present

## 2022-01-19 DIAGNOSIS — I10 Essential (primary) hypertension: Secondary | ICD-10-CM | POA: Diagnosis not present

## 2022-01-19 LAB — POCT GLYCOSYLATED HEMOGLOBIN (HGB A1C): HbA1c, POC (controlled diabetic range): 7.3 % — AB (ref 0.0–7.0)

## 2022-01-19 MED ORDER — SEMAGLUTIDE (1 MG/DOSE) 4 MG/3ML ~~LOC~~ SOPN: 1.0000 mg | PEN_INJECTOR | SUBCUTANEOUS | 1 refills | Status: DC

## 2022-01-19 NOTE — Progress Notes (Signed)
01/19/2022  Endocrinology follow-up note  Subjective:    Patient ID: Aaron Potter, male    DOB: 11/18/1960, PCP Suzan Slick, MD   Past Medical History:  Diagnosis Date   Allergic rhinitis, unspecified    Allergy    Anxiety disorder, unspecified    Asthma    Back pain    Balanitis    Cellulitis    COPD (chronic obstructive pulmonary disease) (HCC)    Diabetes mellitus without complication (HCC)    Essential (primary) hypertension    Gastro-esophageal reflux disease with esophagitis    Gout    Gout, unspecified    Hypertension    Lichen sclerosus    Mixed hyperlipidemia    Morbid (severe) obesity with alveolar hypoventilation (HCC)    Obesity, morbid (more than 100 lbs over ideal weight or BMI > 40) (HCC)    Obesity, unspecified    Polyosteoarthritis, unspecified    Sleep apnea    Sleep apnea, unspecified    CPAP   Type 2 diabetes mellitus with hyperglycemia (HCC)    Urticaria    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    Past Surgical History:  Procedure Laterality Date   CHOLECYSTECTOMY     ERCP  1998   [facility], CBD stone s/p cholecystectomy   FOOT SURGERY Right 04/2017   Social History   Socioeconomic History   Marital status: Single    Spouse name: Not on file   Number of children: 0   Years of education: Not on file   Highest education level: Not on file  Occupational History   Not on file  Tobacco Use   Smoking status: Never   Smokeless tobacco: Never  Vaping Use   Vaping Use: Never used  Substance and Sexual Activity   Alcohol use: No    Alcohol/week: 0.0 standard drinks of alcohol   Drug use: No   Sexual activity: Yes  Other Topics Concern   Not on file  Social History Narrative   Not on file   Social Determinants of Health   Financial Resource Strain: Not on file  Food Insecurity: Not on file  Transportation Needs: Not on file  Physical Activity: Not on file  Stress: Not on file  Social Connections:  Not on file   Outpatient Encounter Medications as of 01/19/2022  Medication Sig   Semaglutide, 1 MG/DOSE, 4 MG/3ML SOPN Inject 1 mg as directed once a week.   ACCU-CHEK AVIVA PLUS test strip USE TO CHECK BLOOD SUGAR 4 TIMES DAILY.   Accu-Chek FastClix Lancets MISC USE TO CHECK BLOOD SUGAR FOUR TIMES A DAY.   albuterol (PROVENTIL) (2.5 MG/3ML) 0.083% nebulizer solution Take 3 mLs (2.5 mg total) by nebulization every 6 (six) hours as needed for wheezing or shortness of breath.   allopurinol (ZYLOPRIM) 300 MG tablet Take 300 mg by mouth daily.     ALPRAZolam (XANAX) 0.5 MG tablet Take 1 tablet by mouth 2 (two) times daily as needed for anxiety.   Blood Glucose Monitoring Suppl (ACCU-CHEK GUIDE) w/Device KIT 1 Piece by Does not apply route as directed.   bumetanide (BUMEX) 1 MG tablet Take 1 tablet (1 mg total) by mouth daily.   cephALEXin (KEFLEX) 500 MG capsule Take 1 capsule (500 mg total) by mouth 4 (four) times daily. (Patient not taking: Reported on 01/06/2022)   diclofenac Sodium (VOLTAREN) 1 % GEL Apply 2 g topically 4 (four) times daily as needed (pain).   gabapentin (NEURONTIN) 300 MG  capsule Take 1 capsule (300 mg total) by mouth 2 (two) times daily.   GLOBAL EASE INJECT PEN NEEDLES 31G X 8 MM MISC USE 4 TIMES DAILY.   insulin regular human CONCENTRATED (HUMULIN R U-500 KWIKPEN) 500 UNIT/ML KwikPen INJECT 80-110 UNITS INTO THE SKIN 3 TIMES DAILY WITH MEALS.   metFORMIN (GLUCOPHAGE) 500 MG tablet TAKE 2 TABLETS BY MOUTH TWICE DAILY.   Multiple Vitamin (MULTIVITAMIN WITH MINERALS) TABS tablet Take 1 tablet by mouth daily.   oxyCODONE-acetaminophen (PERCOCET) 7.5-325 MG tablet Take 1 tablet by mouth every 6 (six) hours as needed for severe pain.   pantoprazole (PROTONIX) 40 MG tablet TAKE 1 TABLET DAILY, 30 MINUTES BEFORE BREAKFAST ` (Patient taking differently: Take 40 mg by mouth daily.)   pravastatin (PRAVACHOL) 40 MG tablet Take 40 mg by mouth daily.   sacubitril-valsartan (ENTRESTO) 49-51  MG Take 1 tablet by mouth 2 (two) times daily.   silver sulfADIAZINE (SILVADENE) 1 % cream Apply 1 application  topically daily.   spironolactone (ALDACTONE) 25 MG tablet TAKE 1 TABLET ONCE DAILY.   VENTOLIN HFA 108 (90 Base) MCG/ACT inhaler 2 PUFFS INTO THE LUNGS EVERY 6 HOURS AS NEEDED FOR WHEEZING OR SHORTNESS OF BREATH.   verapamil (CALAN-SR) 120 MG CR tablet Take 1 tablet (120 mg total) by mouth daily.   Vitamin D, Ergocalciferol, 50000 units CAPS TAKE 1 CAPSULE BY MOUTH ONCE A WEEK.   [DISCONTINUED] BYDUREON BCISE 2 MG/0.85ML AUIJ INJECT INTO THE SKIN ONCE A WEEK.   [DISCONTINUED] HUMULIN R U-500 KWIKPEN 500 UNIT/ML KwikPen INJECT 80-110 UNITS INTO THE SKIN 3 TIMES DAILY WITH MEALS.   No facility-administered encounter medications on file as of 01/19/2022.   ALLERGIES: Allergies  Allergen Reactions   Biaxin [Clarithromycin] Anaphylaxis and Shortness Of Breath   Ace Inhibitors     D/c ACEi 05/20/2019 > changed to clonidine for pain control component   Although even in retrospect it may not be clear the ACEi contributed to the pt's symptoms, adding them back at this point or in the future would risk confusion in interpretation of non-specific respiratory symptoms to which this patient is prone ie Better not to muddy the waters here.     Marcelline Deist [Dapagliflozin] Other (See Comments)   VACCINATION STATUS: Immunization History  Administered Date(s) Administered   Influenza Inj Mdck Quad Pf 10/31/2019, 11/12/2020   Influenza-Unspecified 10/18/2010, 10/18/2011, 10/18/2012, 12/11/2013, 09/11/2014, 10/13/2015, 09/06/2018   Moderna Sars-Covid-2 Vaccination 04/01/2019, 04/29/2019   PNEUMOCOCCAL CONJUGATE-20 05/08/2020   Tdap 04/13/2016    Diabetes He presents for his follow-up diabetic visit. He has type 2 diabetes mellitus. Onset time: He was diagnosed at approximate age of 32 years. His disease course has been improving. There are no hypoglycemic associated symptoms. Pertinent negatives for  hypoglycemia include no confusion, headaches, pallor or seizures. Associated symptoms include polydipsia and polyuria. Pertinent negatives for diabetes include no chest pain, no fatigue, no polyphagia and no weakness. There are no hypoglycemic complications. Symptoms are improving. Diabetic complications include PVD. Risk factors for coronary artery disease include dyslipidemia, diabetes mellitus, family history, male sex, obesity and sedentary lifestyle. Current diabetic treatment includes intensive insulin program. He is compliant with treatment most of the time. His weight is fluctuating minimally. He is following a generally unhealthy diet. When asked about meal planning, he reported none. He has had a previous visit with a dietitian. He never participates in exercise. His home blood glucose trend is decreasing steadily. His breakfast blood glucose range is generally 140-180 mg/dl. His lunch  blood glucose range is generally 140-180 mg/dl. His dinner blood glucose range is generally 140-180 mg/dl. His bedtime blood glucose range is generally 140-180 mg/dl. His overall blood glucose range is 140-180 mg/dl. Aaron Potter presents with improved glycemic profile.  His point-of-care A1c is 7.3% improving from 8.1% during his last visit.     ) An ACE inhibitor/angiotensin II receptor blocker is being taken.  Hyperlipidemia This is a chronic problem. The current episode started more than 1 year ago. The problem is uncontrolled. Exacerbating diseases include diabetes and obesity. Associated symptoms include shortness of breath. Pertinent negatives include no chest pain or myalgias. Current antihyperlipidemic treatment includes statins. Risk factors for coronary artery disease include diabetes mellitus, dyslipidemia, hypertension, male sex, obesity and a sedentary lifestyle.  Hypertension This is a chronic problem. The current episode started more than 1 year ago. The problem is uncontrolled. Associated symptoms include  shortness of breath. Pertinent negatives include no chest pain, headaches, neck pain or palpitations. Risk factors for coronary artery disease include diabetes mellitus, dyslipidemia, obesity, male gender, sedentary lifestyle and family history. Past treatments include ACE inhibitors and calcium channel blockers. The current treatment provides no improvement. Compliance problems include diet.  Hypertensive end-organ damage includes PVD.     Review of systems  Limited as above.   Objective:    BP (!) 110/54   Pulse 80   Ht 5\' 9"  (1.753 m)   Wt (!) 451 lb 3.2 oz (204.7 kg)   BMI 66.63 kg/m   Wt Readings from Last 3 Encounters:  01/19/22 (!) 451 lb 3.2 oz (204.7 kg)  01/07/22 (!) 444 lb 6.4 oz (201.6 kg)  08/23/21 (!) 440 lb 12.8 oz (199.9 kg)     Physical Exam- Limited  Edematous lower extremities. Calluses, poor circulation on bilateral lower extremities.  Diminished monofilament test. Results for orders placed or performed in visit on 01/19/22  HgB A1c  Result Value Ref Range   Hemoglobin A1C     HbA1c POC (<> result, manual entry)     HbA1c, POC (prediabetic range)     HbA1c, POC (controlled diabetic range) 7.3 (A) 0.0 - 7.0 %   Diabetic Labs (most recent): Lab Results  Component Value Date   HGBA1C 7.3 (A) 01/19/2022   HGBA1C 8.1 (A) 01/13/2021   HGBA1C 7.7 (A) 10/06/2020   MICROALBUR 0.8 06/11/2018   MICROALBUR 5.3 10/15/2016   MICROALBUR 1.4 03/12/2015   Lipid Panel     Component Value Date/Time   CHOL 123 01/10/2022 1015   TRIG 82 01/10/2022 1015   HDL 42 01/10/2022 1015   CHOLHDL 2.9 01/10/2022 1015   CHOLHDL 3.8 04/01/2019 0843   VLDL 31 (H) 03/12/2015 1200   LDLCALC 65 01/10/2022 1015   LDLCALC 79 04/01/2019 0843      Latest Ref Rng & Units 01/10/2022   10:15 AM 06/09/2021    8:29 AM 05/10/2021    8:08 AM  CMP  Glucose 70 - 99 mg/dL 106  112  93   BUN 6 - 24 mg/dL 13  8  17    Creatinine 0.76 - 1.27 mg/dL 0.82  0.83  1.00   Sodium 134 - 144 mmol/L  142  141  142   Potassium 3.5 - 5.2 mmol/L 5.2  4.3  6.0   Chloride 96 - 106 mmol/L 104  100  102   CO2 20 - 29 mmol/L 23  26  27    Calcium 8.7 - 10.2 mg/dL 10.1  10.0  9.9  Total Protein 6.0 - 8.5 g/dL 6.4   6.8   Total Bilirubin 0.0 - 1.2 mg/dL 0.4   0.4   Alkaline Phos 44 - 121 IU/L 70   71   AST 0 - 40 IU/L 23   28   ALT 0 - 44 IU/L 22   21      Assessment & Plan:   1. Uncontrolled type 2 diabetes mellitus with diabetic peripheral angiopathy without gangrene, with long-term current use of insulin (HCC)  Aaron Potter presents with improved glycemic profile.  His point-of-care A1c is 7.3% improving from 8.1% during his last visit.    He remains at extremely high risk for more acute and chronic complications of diabetes which include CAD, CVA, CKD, retinopathy, and neuropathy. These are all discussed in detail with the patient.   Glucose logs and insulin administration records pertaining to this visit,  to be scanned into patient's records.  Recent labs reviewed.  - he acknowledges that there is a room for improvement in his food and drink choices. - Suggestion is made for him to avoid simple carbohydrates  from his diet including Cakes, Sweet Desserts, Ice Cream, Soda (diet and regular), Sweet Tea, Candies, Chips, Cookies, Store Bought Juices, Alcohol , Artificial Sweeteners,  Coffee Creamer, and "Sugar-free" Products, Lemonade. This will help patient to have more stable blood glucose profile and potentially avoid unintended weight gain.  The following Lifestyle Medicine recommendations according to American College of Lifestyle Medicine  [facility]) were discussed and and offered to patient and he  agrees to start the journey:  A. Whole Foods, Plant-Based Nutrition comprising of fruits and vegetables, plant-based proteins, whole-grain carbohydrates was discussed in detail with the patient.   A list for source of those nutrients were also provided to the patient.  Patient will use only water or  unsweetened tea for hydration. B.  The need to stay away from risky substances including alcohol, smoking; obtaining 7 to 9 hours of restorative sleep, at least 150 minutes of moderate intensity exercise weekly, the importance of healthy social connections,  and stress management techniques were discussed. C.  A full color page of  Calorie density of various food groups per pound showing examples of each food groups was provided to the patient.   - Patient is advised to stick to a routine mealtimes to eat 3 meals  a day and avoid unnecessary snacks ( to snack only to correct hypoglycemia).   -He did not make major changes in the recommended Plant predominant Whole Foods lifestyle nutrition.  -For now, he is advised to continue  Humulin U500 to  110 units with breakfast, 110 units with lunch, 80 units with supper  for pre-meal blood glucose readings of 90 or above milligrams per deciliter associated with strict monitoring of blood glucose 4 times a day-before meals and at bedtime.    -Patient is encouraged to call clinic for blood glucose levels less than 70 or above 300 mg /dl. -He is advised to continue Metformin 1000 mg p.o. twice daily-with breakfast and supper.    -He might benefit from Ozempic instead of Bydureon.  He is advised to finish his current supply so by durian and will switch to Ozempic 1 mg subcutaneously weekly.  Side effects and precautions discussed with him.     -He did not tolerate Marcelline Deist, which he has stopped  taking already.   He is advised to stay off of glipizide for now.  -He is following with Aaron Potter, CDE  for DM education.  - Patient specific target  for A1c; LDL, HDL, Triglycerides,  were discussed in detail.  2) BP/HTN: -His blood pressure is controlled to target.  Patient admits he has not taken his blood pressure medications this morning.    He is advised to proceed with his benazepril to 10 mg p.o. daily, also has verapamil 240 mg p.o. daily for  blood pressure treatment.   3) Lipids/HPL: He has uncontrolled LDL, however improving LDL at 79 from 99 .  He is advised to continue pravastatin  40 mg p.o. nightly.     Side effects and precautions discussed with him.     4)  Weight/Diet: His BMI is 34.19-FXTKWIO complicating his diabetes care.  He is a candidate for major weight loss.   He has had no significant success in weight control.  He still admits to dietary indiscretion.   CDE consult in progress, exercise, and carbohydrates information provided.  He hesitates to consider bariatric surgery. He has gotten a pair of diabetes shoes.   5) Chronic Care/Health Maintenance:  -Patient  is  on ACEI/ARB and Statin medications and encouraged to continue to follow up with Ophthalmology, Podiatrist at least yearly or according to recommendations, and advised to  stay away from smoking. I have recommended yearly flu vaccine and pneumonia vaccination at least every 5 years; and  sleep for at least 7 hours a day. -This patient cannot exercise optimally due to his heavy weight and comorbidities.  - I advised patient to maintain close follow up with Leeanne Rio, MD for primary care needs.    I spent 26 minutes in the care of the patient today including review of labs from [facility], Lipids, Thyroid Function, Hematology (current and previous including abstractions from other facilities); face-to-face time discussing  his blood glucose readings/logs, discussing hypoglycemia and hyperglycemia episodes and symptoms, medications doses, his options of short and long term treatment based on the latest standards of care / guidelines;  discussion about incorporating lifestyle medicine;  and documenting the encounter. Risk reduction counseling performed per USPSTF guidelines to reduce  obesity and cardiovascular risk factors.     Please refer to Patient Instructions for Blood Glucose Monitoring and Insulin/Medications Dosing Guide"  in media tab for additional  information. Please  also refer to " Patient Self Inventory" in the Media  tab for reviewed elements of pertinent patient history.  Aaron Potter participated in the discussions, expressed understanding, and voiced agreement with the above plans.  All questions were answered to his satisfaction. he is encouraged to contact clinic should he have any questions or concerns prior to his return visit.    Follow up plan: -Return in about 3 months (around 04/20/2022) for Bring Meter/CGM Device/Logs- A1c in Office.  Glade Lloyd, MD Phone: [phone]  Fax: [phone]  -  This note was partially dictated with voice recognition software. Similar sounding words can be transcribed inadequately or may not  be corrected upon review.  01/19/2022, 1:26 PM

## 2022-01-19 NOTE — Patient Instructions (Signed)
                                     Advice for Weight Management  -For most of us the best way to lose weight is by diet management. Generally speaking, diet management means consuming less calories intentionally which over time brings about progressive weight loss.  This can be achieved more effectively by avoiding ultra processed carbohydrates, processed meats, unhealthy fats.    It is critically important to know your numbers: how much calorie you are consuming and how much calorie you need. More importantly, our carbohydrates sources should be unprocessed naturally occurring  complex starch food items.  It is always important to balance nutrition also by  appropriate intake of proteins (mainly plant-based), healthy fats/oils, plenty of fruits and vegetables.   -The American College of Lifestyle Medicine (ACL M) recommends nutrition derived mostly from Whole Food, Plant Predominant Sources example an apple instead of applesauce or apple pie. Eat Plenty of vegetables, Mushrooms, fruits, Legumes, Whole Grains, Nuts, seeds in lieu of processed meats, processed snacks/pastries red meat, poultry, eggs.  Use only water or unsweetened tea for hydration.  The College also recommends the need to stay away from risky substances including alcohol, smoking; obtaining 7-9 hours of restorative sleep, at least 150 minutes of moderate intensity exercise weekly, importance of healthy social connections, and being mindful of stress and seek help when it is overwhelming.    -Sticking to a routine mealtime to eat 3 meals a day and avoiding unnecessary snacks is shown to have a big role in weight control. Under normal circumstances, the only time we burn stored energy is when we are hungry, so allow  some hunger to take place- hunger means no food between appropriate meal times, only water.  It is not advisable to starve.   -It is better to avoid simple carbohydrates including:  Cakes, Sweet Desserts, Ice Cream, Soda (diet and regular), Sweet Tea, Candies, Chips, Cookies, Store Bought Juices, Alcohol in Excess of  1-2 drinks a day, Lemonade,  Artificial Sweeteners, Doughnuts, Coffee Creamers, "Sugar-free" Products, etc, etc.  This is not a complete list.....    -Consulting with certified diabetes educators is proven to provide you with the most accurate and current information on diet.  Also, you may be  interested in discussing diet options/exchanges , we can schedule a visit with Aaron Potter, RDN, CDE for individualized nutrition education.  -Exercise: If you are able: 30 -60 minutes a day ,4 days a week, or 150 minutes of moderate intensity exercise weekly.    The longer the better if tolerated.  Combine stretch, strength, and aerobic activities.  If you were told in the past that you have high risk for cardiovascular diseases, or if you are currently symptomatic, you may seek evaluation by your heart doctor prior to initiating moderate to intense exercise programs.                                  Additional Care Considerations for Diabetes/Prediabetes   -Diabetes  is a chronic disease.  The most important care consideration is regular follow-up with your diabetes care provider with the goal being avoiding or delaying its complications and to take advantage of advances in medications and technology.  If appropriate actions are taken early enough, type 2 diabetes can even be   reversed.  Seek information from the right source.  - Whole Food, Plant Predominant Nutrition is highly recommended: Eat Plenty of vegetables, Mushrooms, fruits, Legumes, Whole Grains, Nuts, seeds in lieu of processed meats, processed snacks/pastries red meat, poultry, eggs as recommended by American College of  Lifestyle Medicine (ACLM).  -Type 2 diabetes is known to coexist with other important comorbidities such as high blood pressure and high cholesterol.  It is critical to control not only the  diabetes but also the high blood pressure and high cholesterol to minimize and delay the risk of complications including coronary artery disease, stroke, amputations, blindness, etc.  The good news is that this diet recommendation for type 2 diabetes is also very helpful for managing high cholesterol and high blood blood pressure.  - Studies showed that people with diabetes will benefit from a class of medications known as ACE inhibitors and statins.  Unless there are specific reasons not to be on these medications, the standard of care is to consider getting one from these groups of medications at an optimal doses.  These medications are generally considered safe and proven to help protect the heart and the kidneys.    - People with diabetes are encouraged to initiate and maintain regular follow-up with eye doctors, foot doctors, dentists , and if necessary heart and kidney doctors.     - It is highly recommended that people with diabetes quit smoking or stay away from smoking, and get yearly  flu vaccine and pneumonia vaccine at least every 5 years.  See above for additional recommendations on exercise, sleep, stress management , and healthy social connections.      

## 2022-01-24 ENCOUNTER — Other Ambulatory Visit: Payer: Self-pay | Admitting: "Endocrinology

## 2022-01-24 ENCOUNTER — Other Ambulatory Visit: Payer: Self-pay | Admitting: Cardiology

## 2022-01-24 DIAGNOSIS — E1165 Type 2 diabetes mellitus with hyperglycemia: Secondary | ICD-10-CM

## 2022-01-24 DIAGNOSIS — I5032 Chronic diastolic (congestive) heart failure: Secondary | ICD-10-CM

## 2022-01-25 ENCOUNTER — Other Ambulatory Visit: Payer: Self-pay | Admitting: "Endocrinology

## 2022-01-31 ENCOUNTER — Telehealth: Payer: Self-pay

## 2022-01-31 ENCOUNTER — Other Ambulatory Visit: Payer: Self-pay | Admitting: "Endocrinology

## 2022-01-31 MED ORDER — OZEMPIC (0.25 OR 0.5 MG/DOSE) 2 MG/3ML ~~LOC~~ SOPN: 0.5000 mg | PEN_INJECTOR | SUBCUTANEOUS | 2 refills | Status: DC

## 2022-01-31 NOTE — Telephone Encounter (Signed)
Pt called stating he took his Ozempic 1mg  Saturday, experienced nausea and vomiting but is feelling better this afternoon.

## 2022-02-01 NOTE — Telephone Encounter (Signed)
Discussed with pt, understanding voiced.

## 2022-02-17 ENCOUNTER — Telehealth: Payer: Self-pay

## 2022-02-17 NOTE — Telephone Encounter (Signed)
Pt called stating he took the reduced dose of ozempic 0.50m Saturday and continues to experience nause, vomiting and diarrhea.

## 2022-02-18 NOTE — Telephone Encounter (Signed)
Discussed with pt, he stated he has been eating the wrong foods while taking the ozempic and would like to try it one more time.

## 2022-02-22 ENCOUNTER — Telehealth: Payer: Self-pay

## 2022-02-22 ENCOUNTER — Other Ambulatory Visit: Payer: Self-pay | Admitting: Cardiology

## 2022-02-22 ENCOUNTER — Other Ambulatory Visit: Payer: Self-pay | Admitting: "Endocrinology

## 2022-02-22 DIAGNOSIS — I5032 Chronic diastolic (congestive) heart failure: Secondary | ICD-10-CM

## 2022-02-22 NOTE — Telephone Encounter (Signed)
Pt called stating he took his ozempic 0.13m Saturday, states he experienced vomiting, nausea and aching Sunday and yesterday. Pt asked if you would prescribe him something for the nausea. States he has lost 12lbs and has had 2 hypoglycemic episodes where his BG was in the 60s.

## 2022-02-23 NOTE — Telephone Encounter (Signed)
Discussed with pt, understanding voiced. 

## 2022-03-25 ENCOUNTER — Other Ambulatory Visit: Payer: Self-pay | Admitting: "Endocrinology

## 2022-03-25 DIAGNOSIS — E1165 Type 2 diabetes mellitus with hyperglycemia: Secondary | ICD-10-CM

## 2022-03-28 ENCOUNTER — Other Ambulatory Visit: Payer: Self-pay | Admitting: "Endocrinology

## 2022-03-28 DIAGNOSIS — E1165 Type 2 diabetes mellitus with hyperglycemia: Secondary | ICD-10-CM

## 2022-04-14 IMAGING — CT CT ANGIO CHEST
2 of 7 series · 18 of 46 positions shown · IV contrast (Omnipaque or Isovue)
Comparison: None.

CLINICAL DATA: Elevated D-dimer, hypoxia

EXAM:
CT ANGIOGRAPHY CHEST WITH CONTRAST
TECHNIQUE: Multidetector CT imaging of the chest was performed using the
standard protocol during bolus administration of intravenous
contrast. Multiplanar CT image reconstructions and MIPs were
obtained to evaluate the vascular anatomy.

[Series 6: pe axial thins · axial · 0.80mm/px · z∈[+1131,+1443]mm · 15 of 436 slices shown]
[im 23/436  lung]
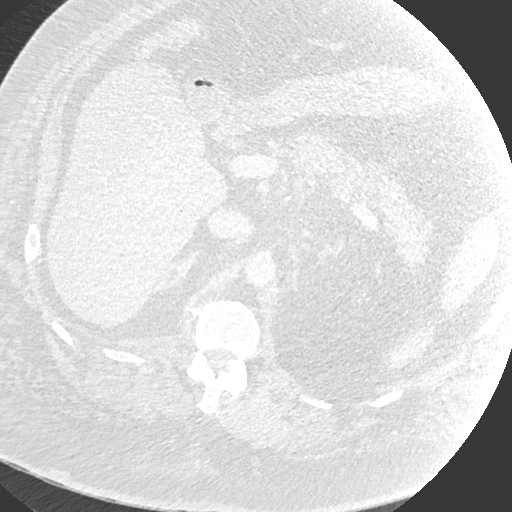
[im 46/436  soft-tissue]
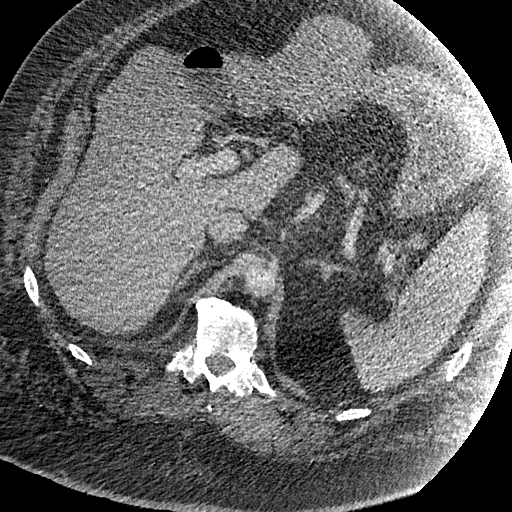
[im 92/436  lung]
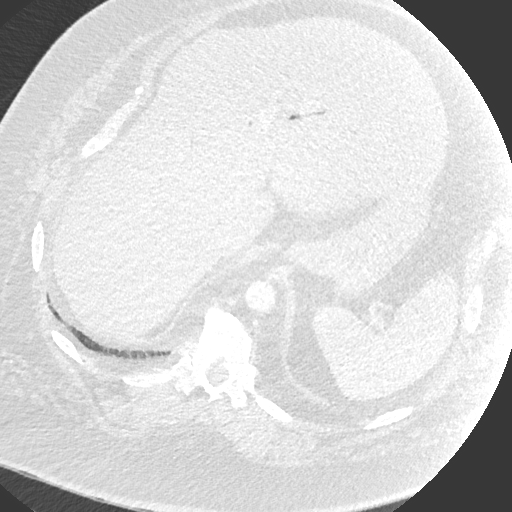
[im 115/436  soft-tissue]
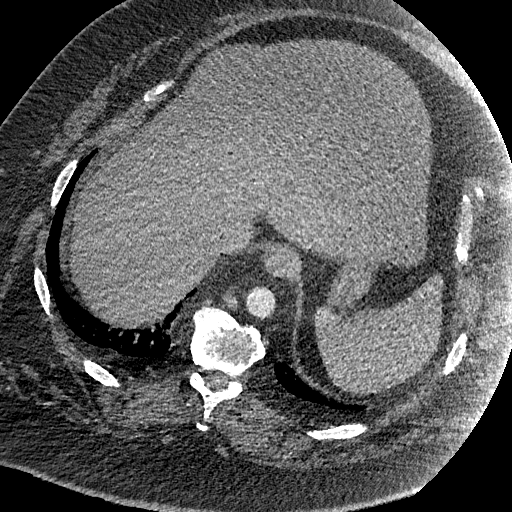
[im 138/436  lung]
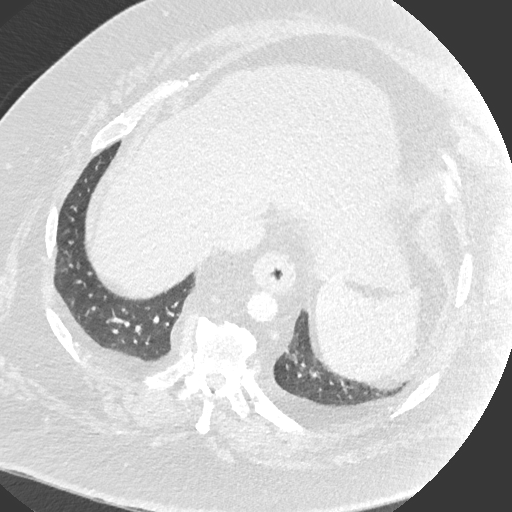
[im 161/436  soft-tissue]
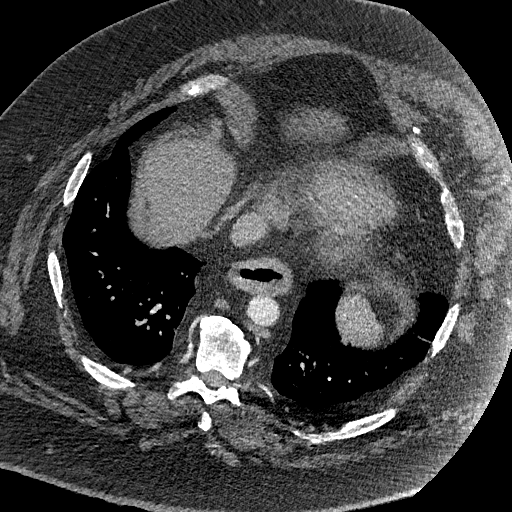
[im 184/436  lung]
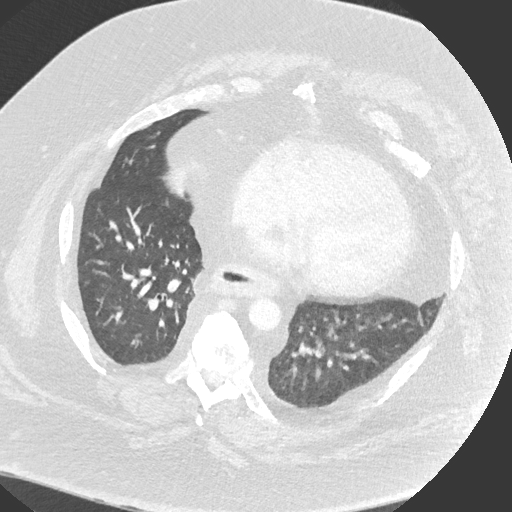
[im 229/436  soft-tissue]
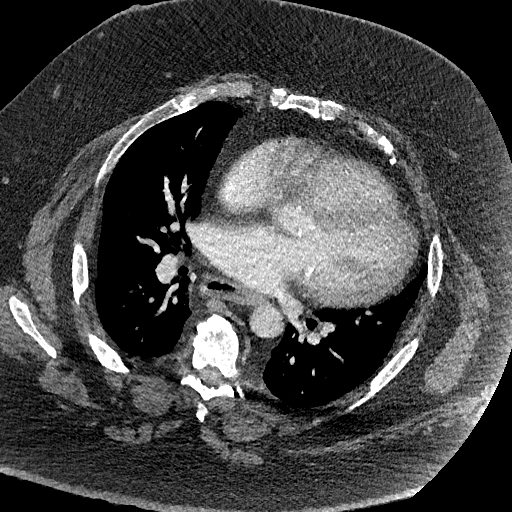
[im 252/436  lung]
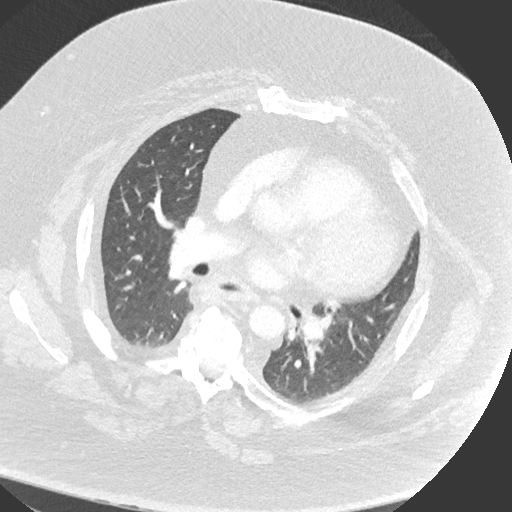
[im 275/436  soft-tissue]
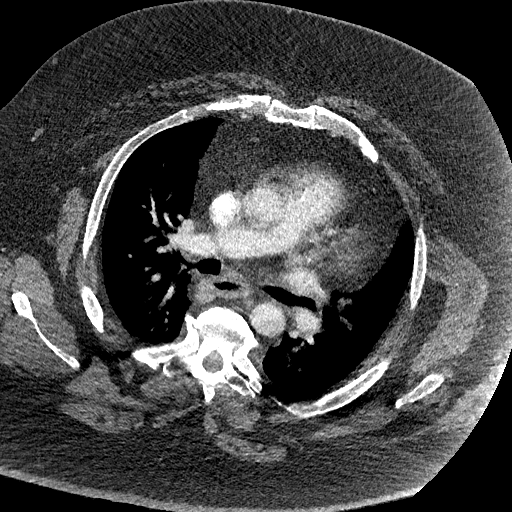
[im 298/436  lung]
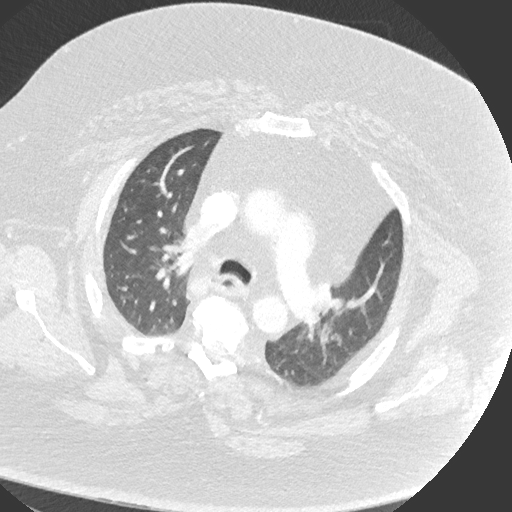
[im 321/436  soft-tissue]
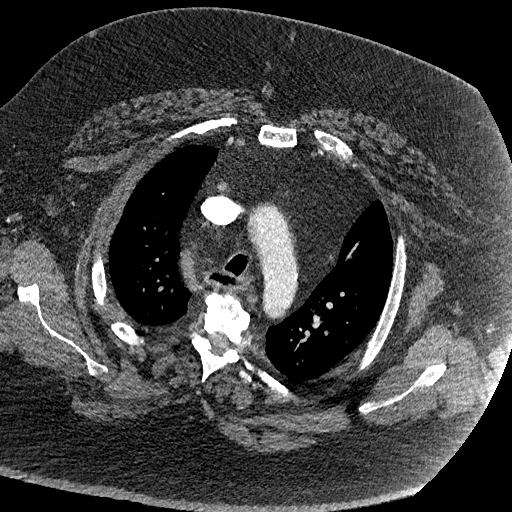
[im 367/436  lung]
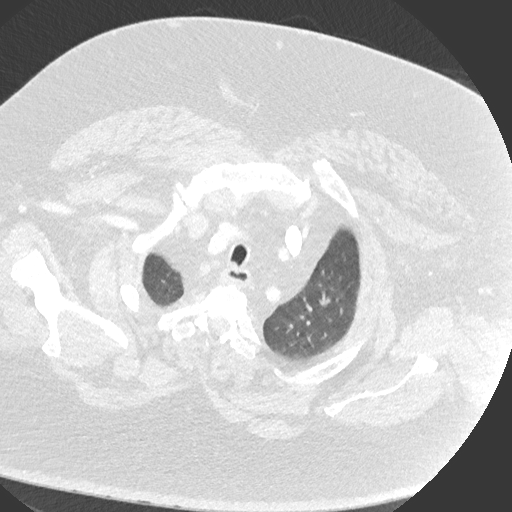
[im 390/436  soft-tissue]
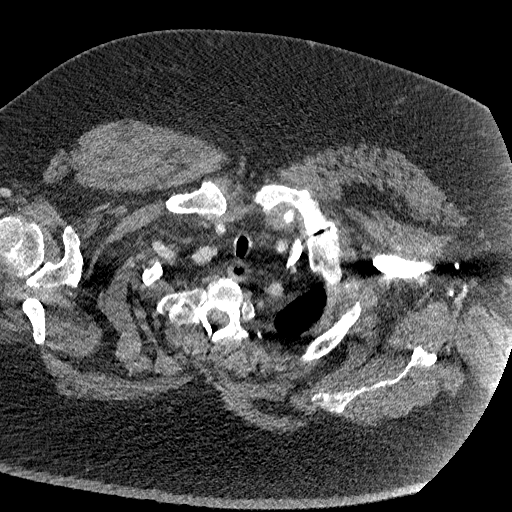
[im 413/436  lung]
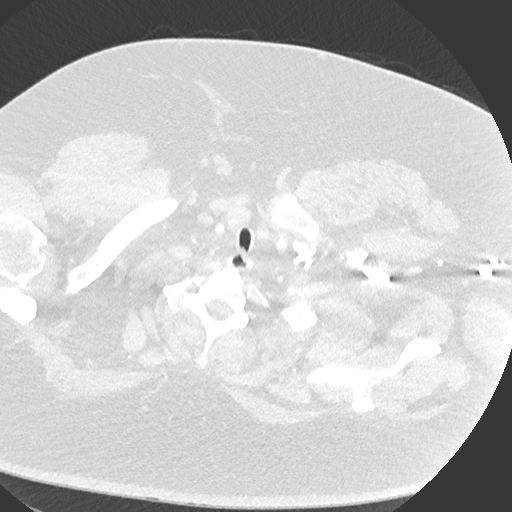

[Series 7: cor soft · coronal · 0.69mm/px · 3 of 168 slices shown]
[im 42/168  soft-tissue]
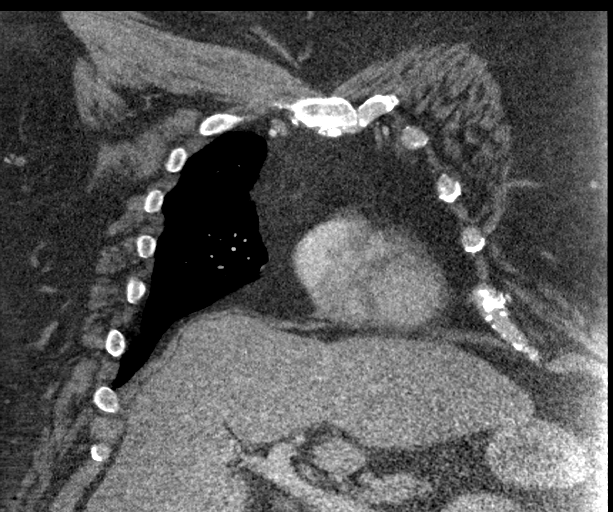
[im 84/168  soft-tissue]
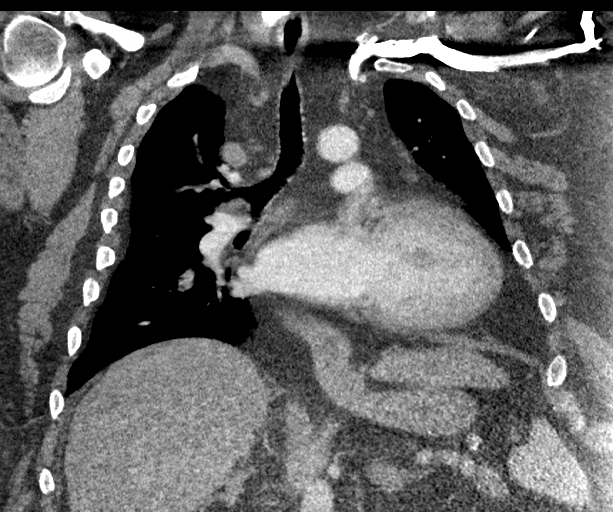
[im 126/168  soft-tissue]
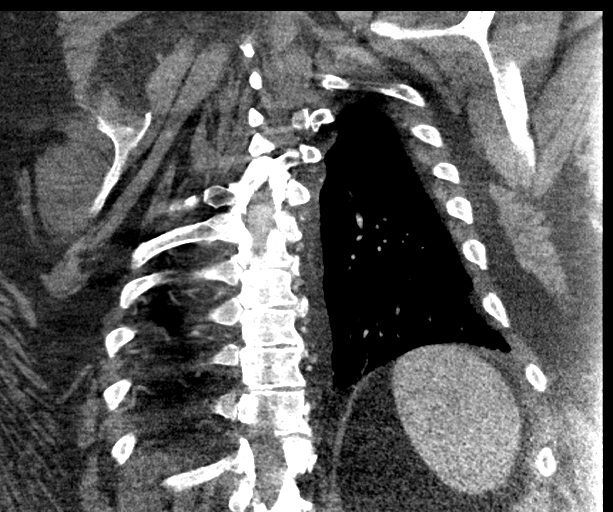

[18 of 46 positions shown; findings below may reference images not displayed]

RADIATION DOSE REDUCTION: This exam was performed according to the
departmental dose-optimization program which includes automated
exposure control, adjustment of the mA and/or kV according to
patient size and/or use of iterative reconstruction technique.

CONTRAST:  100mL OMNIPAQUE IOHEXOL 350 MG/ML SOLN
FINDINGS: Study is limited due to body habitus and associated artifacts.

Cardiovascular: No definite pulmonary embolism identified. Distal
segmental and subsegmental branches are not well evaluated. Main
pulmonary artery is normal caliber. Heart is enlarged. No
pericardial effusion visualized. Thoracic aorta is normal in course
and caliber.

Mediastinum/Nodes: No bulky axillary, mediastinal or hilar
lymphadenopathy identified. There is significant wall thickening of
the distal third of the esophagus.

Lungs/Pleura: No focal consolidation identified in the lungs. No
pleural effusion or pneumothorax.

Upper Abdomen: Pneumobilia visualized with air in the gallbladder
and mildly within the intrahepatic ducts. Hepatomegaly and
splenomegaly.

Musculoskeletal: Degenerative changes in the spine. No suspicious
bony lesions identified.

Review of the MIP images confirms the above findings.
IMPRESSION: 1. No pulmonary embolism identified. Limited study due to body
habitus and associated artifacts.
2. Significant wall thickening of the distal third of the esophagus.
Correlate clinically and consider follow-up endoscopy.
3. Hepatosplenomegaly.
4. Pneumobilia.
5. Cardiomegaly.

## 2022-04-20 ENCOUNTER — Encounter: Payer: Self-pay | Admitting: "Endocrinology

## 2022-04-20 ENCOUNTER — Ambulatory Visit: Payer: Medicaid Other | Admitting: "Endocrinology

## 2022-04-20 VITALS — BP 136/54 | HR 76 | Ht 69.0 in | Wt >= 6400 oz

## 2022-04-20 DIAGNOSIS — E782 Mixed hyperlipidemia: Secondary | ICD-10-CM

## 2022-04-20 DIAGNOSIS — Z794 Long term (current) use of insulin: Secondary | ICD-10-CM | POA: Diagnosis not present

## 2022-04-20 DIAGNOSIS — E1169 Type 2 diabetes mellitus with other specified complication: Secondary | ICD-10-CM

## 2022-04-20 DIAGNOSIS — I10 Essential (primary) hypertension: Secondary | ICD-10-CM

## 2022-04-20 LAB — POCT GLYCOSYLATED HEMOGLOBIN (HGB A1C): HbA1c, POC (controlled diabetic range): 7.2 % — AB (ref 0.0–7.0)

## 2022-04-20 NOTE — Progress Notes (Signed)
04/20/2022  Endocrinology follow-up note  Subjective:    Patient ID: Aaron Potter, male    DOB: [birth year], PCP Catalina Lunger, DO   Past Medical History:  Diagnosis Date  . Allergic rhinitis, unspecified   . Allergy   . Anxiety disorder, unspecified   . Asthma   . Back pain   . Balanitis   . Cellulitis   . COPD (chronic obstructive pulmonary disease)   . Diabetes mellitus without complication   . Essential (primary) hypertension   . Gastro-esophageal reflux disease with esophagitis   . Gout   . Gout, unspecified   . Hypertension   . Lichen sclerosus   . Mixed hyperlipidemia   . Morbid (severe) obesity with alveolar hypoventilation   . Obesity, morbid (more than 100 lbs over ideal weight or BMI > 40)   . Obesity, unspecified   . Polyosteoarthritis, unspecified   . Sleep apnea   . Sleep apnea, unspecified    CPAP  . Type 2 diabetes mellitus with hyperglycemia   . Urticaria   . 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 CHOLECYSTECTOMY    . ERCP  1998   [facility], CBD stone s/p cholecystectomy  . FOOT SURGERY Right 04/2017   Social History   Socioeconomic History  . Marital status: Single    Spouse name: Not on file  . Number of children: 0  . Years of education: Not on file  . Highest education level: Not on file  Occupational History  . Not on file  Tobacco Use  . Smoking status: Never  . Smokeless tobacco: Never  Vaping Use  . Vaping Use: Never used  Substance and Sexual Activity  . Alcohol use: No    Alcohol/week: 0.0 standard drinks of alcohol  . Drug use: No  . Sexual activity: Yes  Other Topics Concern  . Not on file  Social History Narrative  . Not on file   Social Determinants of Health   Financial Resource Strain: Not on file  Food Insecurity: Not on file  Transportation Needs: Not on file  Physical Activity: Not on file  Stress: Not on file  Social  Connections: Not on file   Outpatient Encounter Medications as of 04/20/2022  Medication Sig  . Accu-Chek FastClix Lancets MISC USE TO CHECK BLOOD SUGAR FOUR TIMES A DAY.  Marland Kitchen albuterol (PROVENTIL) (2.5 MG/3ML) 0.083% nebulizer solution Take 3 mLs (2.5 mg total) by nebulization every 6 (six) hours as needed for wheezing or shortness of breath.  . allopurinol (ZYLOPRIM) 300 MG tablet Take 300 mg by mouth daily.    Marland Kitchen ALPRAZolam (XANAX) 0.5 MG tablet Take 1 tablet by mouth 2 (two) times daily as needed for anxiety.  . Blood Glucose Monitoring Suppl (ACCU-CHEK GUIDE) w/Device KIT 1 Piece by Does not apply route as directed.  . bumetanide (BUMEX) 1 MG tablet Take 1 tablet (1 mg total) by mouth daily.  . cephALEXin (KEFLEX) 500 MG capsule Take 1 capsule (500 mg total) by mouth 4 (four) times daily. (Patient not taking: Reported on 01/06/2022)  . diclofenac Sodium (VOLTAREN) 1 % GEL Apply 2 g topically 4 (four) times daily as needed (pain).  Marland Kitchen gabapentin (NEURONTIN) 300 MG capsule Take 1 capsule (300 mg total) by mouth 2 (two) times daily.  Marland Kitchen glucose blood (ACCU-CHEK AVIVA PLUS) test strip E11.65  . Insulin Pen Needle (GLOBAL EASE INJECT PEN NEEDLES) 31G X 8 MM  MISC USE 4 TIMES DAILY.  Marland Kitchen insulin regular human CONCENTRATED (HUMULIN R U-500 KWIKPEN) 500 UNIT/ML KwikPen INJECT 80-110 UNITS INTO THE SKIN 3 TIMES DAILY WITH MEALS.  . metFORMIN (GLUCOPHAGE) 500 MG tablet TAKE 2 TABLETS BY MOUTH TWICE DAILY.  . Multiple Vitamin (MULTIVITAMIN WITH MINERALS) TABS tablet Take 1 tablet by mouth daily.  Marland Kitchen oxyCODONE-acetaminophen (PERCOCET) 7.5-325 MG tablet Take 1 tablet by mouth every 6 (six) hours as needed for severe pain.  . pantoprazole (PROTONIX) 40 MG tablet TAKE 1 TABLET DAILY, 30 MINUTES BEFORE BREAKFAST ` (Patient taking differently: Take 40 mg by mouth daily.)  . pravastatin (PRAVACHOL) 40 MG tablet Take 40 mg by mouth daily.  . sacubitril-valsartan (ENTRESTO) 49-51 MG Take 1 tablet by mouth 2 (two) times  daily.  . silver sulfADIAZINE (SILVADENE) 1 % cream Apply 1 application  topically daily.  Marland Kitchen spironolactone (ALDACTONE) 25 MG tablet TAKE 1 TABLET ONCE DAILY.  . VENTOLIN HFA 108 (90 Base) MCG/ACT inhaler 2 PUFFS INTO THE LUNGS EVERY 6 HOURS AS NEEDED FOR WHEEZING OR SHORTNESS OF BREATH.  . verapamil (CALAN-SR) 120 MG CR tablet Take 1 tablet (120 mg total) by mouth daily.  . Vitamin D, Ergocalciferol, 50000 units CAPS TAKE 1 CAPSULE BY MOUTH ONCE A WEEK.  . [DISCONTINUED] Semaglutide,0.25 or 0.5MG /DOS, (OZEMPIC, 0.25 OR 0.5 MG/DOSE,) 2 MG/3ML SOPN Inject 0.5 mg into the skin once a week. (Patient not taking: Reported on 04/20/2022)   No facility-administered encounter medications on file as of 04/20/2022.   ALLERGIES: Allergies  Allergen Reactions  . Biaxin [Clarithromycin] Anaphylaxis and Shortness Of Breath  . Ace Inhibitors     D/c ACEi 05/20/2019 > changed to clonidine for pain control component   Although even in retrospect it may not be clear the ACEi contributed to the pt's symptoms, adding them back at this point or in the future would risk confusion in interpretation of non-specific respiratory symptoms to which this patient is prone ie Better not to muddy the waters here.    Marcelline Deist [Dapagliflozin] Other (See Comments)   VACCINATION STATUS: Immunization History  Administered Date(s) Administered  . Influenza Inj Mdck Quad Pf 10/31/2019, 11/12/2020  . Influenza-Unspecified 10/18/2010, 10/18/2011, 10/18/2012, 12/11/2013, 09/11/2014, 10/13/2015, 09/06/2018  . Moderna Sars-Covid-2 Vaccination 04/01/2019, 04/29/2019  . PNEUMOCOCCAL CONJUGATE-20 05/08/2020  . Tdap 04/13/2016    Diabetes He presents for his follow-up diabetic visit. He has type 2 diabetes mellitus. Onset time: He was diagnosed at approximate age of 21 years. His disease course has been improving. There are no hypoglycemic associated symptoms. Pertinent negatives for hypoglycemia include no confusion, headaches,  pallor or seizures. Associated symptoms include polydipsia and polyuria. Pertinent negatives for diabetes include no chest pain, no fatigue, no polyphagia and no weakness. There are no hypoglycemic complications. Symptoms are improving. Diabetic complications include PVD. Risk factors for coronary artery disease include dyslipidemia, diabetes mellitus, family history, male sex, obesity and sedentary lifestyle. Current diabetic treatment includes intensive insulin program. He is compliant with treatment most of the time. His weight is fluctuating minimally. He is following a generally unhealthy diet. When asked about meal planning, he reported none. He has had a previous visit with a dietitian. He never participates in exercise. His home blood glucose trend is decreasing steadily. His breakfast blood glucose range is generally 140-180 mg/dl. His lunch blood glucose range is generally 140-180 mg/dl. His dinner blood glucose range is generally 140-180 mg/dl. His bedtime blood glucose range is generally 140-180 mg/dl. His overall blood glucose range is  140-180 mg/dl. Aaron Potter presents with improved glycemic profile.  His point-of-care A1c is 7.3% improving from 8.1% during his last visit.     ) An ACE inhibitor/angiotensin II receptor blocker is being taken.  Hyperlipidemia This is a chronic problem. The current episode started more than 1 year ago. The problem is uncontrolled. Exacerbating diseases include diabetes and obesity. Associated symptoms include shortness of breath. Pertinent negatives include no chest pain or myalgias. Current antihyperlipidemic treatment includes statins. Risk factors for coronary artery disease include diabetes mellitus, dyslipidemia, hypertension, male sex, obesity and a sedentary lifestyle.  Hypertension This is a chronic problem. The current episode started more than 1 year ago. The problem is uncontrolled. Associated symptoms include shortness of breath. Pertinent negatives  include no chest pain, headaches, neck pain or palpitations. Risk factors for coronary artery disease include diabetes mellitus, dyslipidemia, obesity, male gender, sedentary lifestyle and family history. Past treatments include ACE inhibitors and calcium channel blockers. The current treatment provides no improvement. Compliance problems include diet.  Hypertensive end-organ damage includes PVD.     Review of systems  Limited as above.   Objective:    BP (!) 136/54   Pulse 76   Ht 5\' 9"  (1.753 m)   Wt (!) 445 lb 6.4 oz (202 kg)   BMI 65.77 kg/m   Wt Readings from Last 3 Encounters:  04/20/22 (!) 445 lb 6.4 oz (202 kg)  01/19/22 (!) 451 lb 3.2 oz (204.7 kg)  01/07/22 (!) 444 lb 6.4 oz (201.6 kg)     Physical Exam- Limited  Edematous lower extremities. Calluses, poor circulation on bilateral lower extremities.  Diminished monofilament test. Results for orders placed or performed in visit on 04/20/22  HgB A1c  Result Value Ref Range   Hemoglobin A1C     HbA1c POC (<> result, manual entry)     HbA1c, POC (prediabetic range)     HbA1c, POC (controlled diabetic range) 7.2 (A) 0.0 - 7.0 %   Diabetic Labs (most recent): Lab Results  Component Value Date   HGBA1C 7.2 (A) 04/20/2022   HGBA1C 7.3 (A) 01/19/2022   HGBA1C 8.1 (A) 01/13/2021   MICROALBUR 0.8 06/11/2018   MICROALBUR 5.3 10/15/2016   MICROALBUR 1.4 03/12/2015   Lipid Panel     Component Value Date/Time   CHOL 123 01/10/2022 1015   TRIG 82 01/10/2022 1015   HDL 42 01/10/2022 1015   CHOLHDL 2.9 01/10/2022 1015   CHOLHDL 3.8 04/01/2019 0843   VLDL 31 (H) 03/12/2015 1200   LDLCALC 65 01/10/2022 1015   LDLCALC 79 04/01/2019 0843      Latest Ref Rng & Units 01/10/2022   10:15 AM 06/09/2021    8:29 AM 05/10/2021    8:08 AM  CMP  Glucose 70 - 99 mg/dL 811  914  93   BUN 6 - 24 mg/dL 13  8  17    Creatinine 0.76 - 1.27 mg/dL 7.82  9.56  2.13   Sodium 134 - 144 mmol/L 142  141  142   Potassium 3.5 - 5.2 mmol/L 5.2   4.3  6.0   Chloride 96 - 106 mmol/L 104  100  102   CO2 20 - 29 mmol/L 23  26  27    Calcium 8.7 - 10.2 mg/dL 08.6  57.8  9.9   Total Protein 6.0 - 8.5 g/dL 6.4   6.8   Total Bilirubin 0.0 - 1.2 mg/dL 0.4   0.4   Alkaline Phos 44 - 121 IU/L 70  71   AST 0 - 40 IU/L 23   28   ALT 0 - 44 IU/L 22   21      Assessment & Plan:   1. Uncontrolled type 2 diabetes mellitus with diabetic peripheral angiopathy without gangrene, with long-term current use of insulin (HCC)  Aaron Potter presents with improved glycemic profile.  His point-of-care A1c is 7.3% improving from 8.1% during his last visit.    He remains at extremely high risk for more acute and chronic complications of diabetes which include CAD, CVA, CKD, retinopathy, and neuropathy. These are all discussed in detail with the patient.   Glucose logs and insulin administration records pertaining to this visit,  to be scanned into patient's records.  Recent labs reviewed.  - he acknowledges that there is a room for improvement in his food and drink choices. - Suggestion is made for him to avoid simple carbohydrates  from his diet including Cakes, Sweet Desserts, Ice Cream, Soda (diet and regular), Sweet Tea, Candies, Chips, Cookies, Store Bought Juices, Alcohol , Artificial Sweeteners,  Coffee Creamer, and "Sugar-free" Products, Lemonade. This will help patient to have more stable blood glucose profile and potentially avoid unintended weight gain.  The following Lifestyle Medicine recommendations according to American College of Lifestyle Medicine  [facility]) were discussed and and offered to patient and he  agrees to start the journey:  A. Whole Foods, Plant-Based Nutrition comprising of fruits and vegetables, plant-based proteins, whole-grain carbohydrates was discussed in detail with the patient.   A list for source of those nutrients were also provided to the patient.  Patient will use only water or unsweetened tea for hydration. B.  The need to  stay away from risky substances including alcohol, smoking; obtaining 7 to 9 hours of restorative sleep, at least 150 minutes of moderate intensity exercise weekly, the importance of healthy social connections,  and stress management techniques were discussed. C.  A full color page of  Calorie density of various food groups per pound showing examples of each food groups was provided to the patient.   - Patient is advised to stick to a routine mealtimes to eat 3 meals  a day and avoid unnecessary snacks ( to snack only to correct hypoglycemia).   -He did not make major changes in the recommended Plant predominant Whole Foods lifestyle nutrition.  -For now, he is advised to continue  Humulin U500 to  110 units with breakfast, 110 units with lunch, 80 units with supper  for pre-meal blood glucose readings of 90 or above milligrams per deciliter associated with strict monitoring of blood glucose 4 times a day-before meals and at bedtime.    -Patient is encouraged to call clinic for blood glucose levels less than 70 or above 300 mg /dl. -He is advised to continue Metformin 1000 mg p.o. twice daily-with breakfast and supper.    -He might benefit from Ozempic instead of Bydureon.  He is advised to finish his current supply so by durian and will switch to Ozempic 1 mg subcutaneously weekly.  Side effects and precautions discussed with him.     -He did not tolerate Marcelline Deist, which he has stopped  taking already.   He is advised to stay off of glipizide for now.  -He is following with Norm Salt, CDE for DM education.  - Patient specific target  for A1c; LDL, HDL, Triglycerides,  were discussed in detail.  2) BP/HTN: -His blood pressure is controlled to target.  Patient admits he  has not taken his blood pressure medications this morning.    He is advised to proceed with his benazepril to 10 mg p.o. daily, also has verapamil 240 mg p.o. daily for blood pressure treatment.   3) Lipids/HPL: He has  uncontrolled LDL, however improving LDL at 79 from 99 .  He is advised to continue pravastatin  40 mg p.o. nightly.     Side effects and precautions discussed with him.     4)  Weight/Diet: His BMI is 66.63-clearly complicating his diabetes care.  He is a candidate for major weight loss.   He has had no significant success in weight control.  He still admits to dietary indiscretion.   CDE consult in progress, exercise, and carbohydrates information provided.  He hesitates to consider bariatric surgery. He has gotten a pair of diabetes shoes.   5) Chronic Care/Health Maintenance:  -Patient  is  on ACEI/ARB and Statin medications and encouraged to continue to follow up with Ophthalmology, Podiatrist at least yearly or according to recommendations, and advised to  stay away from smoking. I have recommended yearly flu vaccine and pneumonia vaccination at least every 5 years; and  sleep for at least 7 hours a day. -This patient cannot exercise optimally due to his heavy weight and comorbidities.  - I advised patient to maintain close follow up with Catalina Lunger, DO for primary care needs.    I spent 26 minutes in the care of the patient today including review of labs from CMP, Lipids, Thyroid Function, Hematology (current and previous including abstractions from other facilities); face-to-face time discussing  his blood glucose readings/logs, discussing hypoglycemia and hyperglycemia episodes and symptoms, medications doses, his options of short and long term treatment based on the latest standards of care / guidelines;  discussion about incorporating lifestyle medicine;  and documenting the encounter. Risk reduction counseling performed per USPSTF guidelines to reduce  obesity and cardiovascular risk factors.     Please refer to Patient Instructions for Blood Glucose Monitoring and Insulin/Medications Dosing Guide"  in media tab for additional information. Please  also refer to " Patient Self  Inventory" in the Media  tab for reviewed elements of pertinent patient history.  Aaron Potter participated in the discussions, expressed understanding, and voiced agreement with the above plans.  All questions were answered to his satisfaction. he is encouraged to contact clinic should he have any questions or concerns prior to his return visit.    Follow up plan: -Return in about 4 months (around 08/20/2022) for F/U with Pre-visit Labs, Meter/CGM/Logs, A1c here.  Marquis Lunch, MD Phone: [phone]  Fax: [phone]  -  This note was partially dictated with voice recognition software. Similar sounding words can be transcribed inadequately or may not  be corrected upon review.  04/20/2022, 3:36 PM

## 2022-04-20 NOTE — Patient Instructions (Signed)

## 2022-04-28 ENCOUNTER — Other Ambulatory Visit: Payer: Self-pay | Admitting: Cardiology

## 2022-04-28 ENCOUNTER — Other Ambulatory Visit: Payer: Self-pay | Admitting: Gastroenterology

## 2022-04-28 DIAGNOSIS — I5032 Chronic diastolic (congestive) heart failure: Secondary | ICD-10-CM

## 2022-05-02 ENCOUNTER — Other Ambulatory Visit: Payer: Self-pay | Admitting: Cardiology

## 2022-05-19 ENCOUNTER — Other Ambulatory Visit: Payer: Self-pay | Admitting: "Endocrinology

## 2022-05-19 DIAGNOSIS — E1165 Type 2 diabetes mellitus with hyperglycemia: Secondary | ICD-10-CM

## 2022-05-26 ENCOUNTER — Other Ambulatory Visit: Payer: Self-pay | Admitting: Podiatry

## 2022-05-26 ENCOUNTER — Other Ambulatory Visit: Payer: Self-pay | Admitting: Cardiology

## 2022-05-26 ENCOUNTER — Other Ambulatory Visit: Payer: Self-pay | Admitting: "Endocrinology

## 2022-05-26 DIAGNOSIS — I5032 Chronic diastolic (congestive) heart failure: Secondary | ICD-10-CM

## 2022-05-27 NOTE — Telephone Encounter (Signed)
Refill request approved

## 2022-06-07 ENCOUNTER — Ambulatory Visit: Payer: Medicaid Other | Admitting: Podiatry

## 2022-06-27 ENCOUNTER — Encounter (HOSPITAL_COMMUNITY): Payer: Self-pay | Admitting: Gastroenterology

## 2022-06-27 ENCOUNTER — Other Ambulatory Visit (HOSPITAL_COMMUNITY): Payer: Self-pay | Admitting: Gastroenterology

## 2022-06-27 ENCOUNTER — Ambulatory Visit (HOSPITAL_COMMUNITY)
Admission: RE | Admit: 2022-06-27 | Discharge: 2022-06-27 | Disposition: A | Discharge status: Home / Self Care | Payer: Medicaid Other | Source: Ambulatory Visit | Attending: Gastroenterology | Admitting: Gastroenterology

## 2022-06-27 ENCOUNTER — Encounter (HOSPITAL_COMMUNITY): Payer: Self-pay

## 2022-06-27 ENCOUNTER — Other Ambulatory Visit: Payer: Self-pay | Admitting: Cardiology

## 2022-06-27 ENCOUNTER — Other Ambulatory Visit: Payer: Self-pay | Admitting: "Endocrinology

## 2022-06-27 DIAGNOSIS — M549 Dorsalgia, unspecified: Secondary | ICD-10-CM | POA: Insufficient documentation

## 2022-06-27 DIAGNOSIS — I5032 Chronic diastolic (congestive) heart failure: Secondary | ICD-10-CM

## 2022-07-22 ENCOUNTER — Other Ambulatory Visit: Payer: Self-pay | Admitting: Cardiology

## 2022-07-22 DIAGNOSIS — I5032 Chronic diastolic (congestive) heart failure: Secondary | ICD-10-CM

## 2022-07-26 ENCOUNTER — Encounter: Payer: Self-pay | Admitting: Cardiology

## 2022-07-26 ENCOUNTER — Ambulatory Visit: Payer: Medicaid Other | Admitting: Cardiology

## 2022-07-26 VITALS — BP 109/41 | HR 68 | Resp 16 | Ht 69.0 in | Wt >= 6400 oz

## 2022-07-26 DIAGNOSIS — E119 Type 2 diabetes mellitus without complications: Secondary | ICD-10-CM

## 2022-07-26 DIAGNOSIS — I10 Essential (primary) hypertension: Secondary | ICD-10-CM

## 2022-07-26 DIAGNOSIS — G4733 Obstructive sleep apnea (adult) (pediatric): Secondary | ICD-10-CM

## 2022-07-26 DIAGNOSIS — Z8249 Family history of ischemic heart disease and other diseases of the circulatory system: Secondary | ICD-10-CM

## 2022-07-26 DIAGNOSIS — I5032 Chronic diastolic (congestive) heart failure: Secondary | ICD-10-CM

## 2022-07-26 DIAGNOSIS — E782 Mixed hyperlipidemia: Secondary | ICD-10-CM

## 2022-07-26 NOTE — Progress Notes (Signed)
Date:  07/26/2022   ID:  [name], DOB 21-Nov-1960, MRN [id number]  PCP:  Catalina Lunger, DO  Cardiologist: Dr. Rayetta Pigg [facility]) and [place], [place], [facility] (established care 08/12/2020)  Date: 07/26/22 Last Office Visit: 06/14/2021  Chief Complaint  Patient presents with   Chronic heart failure with preserved ejection fraction (HFp   Follow-up    HPI  Aaron Potter is a 62 y.o. male whose past medical history and cardiovascular risk factors include: HFpEF, hypertension, Hypertension, hyperlipidemia, insulin-dependent diabetes mellitus type 2, OSA on BiPAP, family history of heart disease, morbid obesity (Body mass index is 66.87 kg/m.).  Patient has known history of HFpEF and now presents for 1 year follow-up visit.  He was hospitalized in April 2023 for acute heart failure exacerbation and was diuresed 22 pounds.  For reasons unknown he has not follow-up since then and now comes today for 1 year follow-up visit.  His hospital discharge weight was approximately 446 pounds.  He is currently 452 pounds.  In the past SGLT2 inhibitors were discontinued due to yeast infections and spironolactone was held secondary to hyperkalemia (overall intolerance is questionable as he was also on potassium supplements at that time).  Since last office visit, he denies anginal chest pain or heart failure symptoms.  He has done well overall given his comorbidities over the last 1 year.  No hospitalizations or urgent care visits for cardiovascular reasons.  No recent labs available for review.  We discussed GLP-1 agonists to help facilitate weight loss given his diabetes and HFpEF.  Patient states that he did try Ozempic and did lose 14 to 15 pounds but was having issues with nausea and vomiting.  He is accompanied by his aide/friend Tresa Endo at today's office visit.  He provides verbal consent with having her present during today's encounter.  FUNCTIONAL STATUS: No family history of  premature coronary disease or sudden cardiac death.   ALLERGIES: Allergies  Allergen Reactions   Biaxin [Clarithromycin] Anaphylaxis and Shortness Of Breath   Ace Inhibitors     D/c ACEi 05/20/2019 > changed to clonidine for pain control component   Although even in retrospect it may not be clear the ACEi contributed to the pt's symptoms, adding them back at this point or in the future would risk confusion in interpretation of non-specific respiratory symptoms to which this patient is prone ie Better not to muddy the waters here.     Marcelline Deist [Dapagliflozin] Other (See Comments)    MEDICATION LIST PRIOR TO VISIT: Current Meds  Medication Sig   Accu-Chek FastClix Lancets MISC USE TO CHECK BLOOD SUGAR FOUR TIMES A DAY.   allopurinol (ZYLOPRIM) 300 MG tablet Take 300 mg by mouth daily.     Blood Glucose Monitoring Suppl (ACCU-CHEK GUIDE) w/Device KIT 1 Piece by Does not apply route as directed.   gabapentin (NEURONTIN) 300 MG capsule TAKE 1 CAPSULE TWICE DAILY   glucose blood (ACCU-CHEK AVIVA PLUS) test strip E11.65   Insulin Pen Needle (GLOBAL EASE INJECT PEN NEEDLES) 31G X 8 MM MISC use 4 times daily.   insulin regular human CONCENTRATED (HUMULIN R U-500 KWIKPEN) 500 UNIT/ML KwikPen inject 80-110 units into the skin 3 times daily with meals.   metFORMIN (GLUCOPHAGE) 500 MG tablet take 2 tablets by mouth twice daily.   Multiple Vitamin (MULTIVITAMIN WITH MINERALS) TABS tablet Take 1 tablet by mouth daily.   oxyCODONE-acetaminophen (PERCOCET) 7.5-325 MG tablet Take 1 tablet by mouth every 6 (six) hours as needed for  severe pain.   pantoprazole (PROTONIX) 40 MG tablet TAKE 1 TABLET DAILY, 30 MINUTES BEFORE BREAKFAST `   pravastatin (PRAVACHOL) 40 MG tablet Take 40 mg by mouth daily.   sacubitril-valsartan (ENTRESTO) 49-51 MG Take 1 tablet by mouth 2 (two) times daily.   silver sulfADIAZINE (SILVADENE) 1 % cream Apply 1 application  topically daily.   spironolactone (ALDACTONE) 25 MG tablet  take 1 tablet once daily.   torsemide (DEMADEX) 10 MG tablet take 1 tablet by mouth once daily.   Vitamin D, Ergocalciferol, 50000 units CAPS take 1 capsule by mouth once a week.   [DISCONTINUED] albuterol (PROVENTIL) (2.5 MG/3ML) 0.083% nebulizer solution Take 3 mLs (2.5 mg total) by nebulization every 6 (six) hours as needed for wheezing or shortness of breath.     PAST MEDICAL HISTORY: Past Medical History:  Diagnosis Date   Allergic rhinitis, unspecified    Allergy    Anxiety disorder, unspecified    Asthma    Back pain    Balanitis    Cellulitis    COPD (chronic obstructive pulmonary disease) (HCC)    Diabetes mellitus without complication (HCC)    Essential (primary) hypertension    Gastro-esophageal reflux disease with esophagitis    Gout    Gout, unspecified    Hypertension    Lichen sclerosus    Mixed hyperlipidemia    Morbid (severe) obesity with alveolar hypoventilation (HCC)    Obesity, morbid (more than 100 lbs over ideal weight or BMI > 40) (HCC)    Obesity, unspecified    Polyosteoarthritis, unspecified    Sleep apnea    Sleep apnea, unspecified    CPAP   Type 2 diabetes mellitus with hyperglycemia (HCC)    Urticaria    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  SOCIAL HISTORY:  The patient  reports that he has never smoked. He has never used smokeless tobacco. He reports that he does not drink alcohol and does not use drugs.  REVIEW OF SYSTEMS: Review of Systems  Constitutional: Positive for weight gain. Negative for chills, fever and weight loss.  HENT:  Negative for hoarse voice  and nosebleeds.   Eyes:  Negative for discharge, double vision and pain.  Cardiovascular:  Positive for dyspnea on exertion (stable) and leg swelling (stable). Negative for chest pain, claudication, near-syncope, orthopnea, palpitations, paroxysmal nocturnal dyspnea and syncope.  Respiratory:  Positive for shortness of breath. Negative for hemoptysis.   Musculoskeletal:  Negative for muscle cramps and myalgias.  Gastrointestinal:  Negative for abdominal pain, constipation, diarrhea, hematemesis, hematochezia, melena, nausea and vomiting.  Neurological:  Negative for dizziness and light-headedness.    PHYSICAL EXAM:    07/26/2022    9:13 AM 04/20/2022   11:10 AM 01/19/2022    9:12 AM  Vitals with BMI  Height 5\' 9"  5\' 9"  5\' 9"   Weight 452 lbs 13 oz 445 lbs 6 oz 451 lbs 3 oz  BMI 66.84 65.74 66.6  Systolic 109 136 578  Diastolic 41 54 54  Pulse 68 76 80    CONSTITUTIONAL: Appears older than stated age, hemodynamically  stable, no acute distress. SKIN: Skin is warm and dry. No rash noted. No cyanosis. No pallor. No jaundice HEAD: Normocephalic and atraumatic.  EYES: No scleral icterus MOUTH/THROAT: Moist oral membranes.  NECK: Unable to evaluate JVP due to short neck stature and significant adipose tissue, do not appreciate carotid bruits. LYMPHATIC: No visible cervical adenopathy.  CHEST Normal respiratory effort. No intercostal retractions  LUNGS: Decreased breath sounds bilaterally.  No stridor. No wheezes. No rales.  CARDIOVASCULAR: Distant heart sound, faint S1-S2, no murmurs rubs or gallops appreciated. ABDOMINAL: Morbidly obese, soft, nontender, nondistended, positive bowel sounds in all 4 quadrants. EXTREMITIES: +1 bilateral pitting edema, decreased DP and PT pulses. HEMATOLOGIC: No significant bruising NEUROLOGIC: Oriented to person, place, and time. Nonfocal. Normal muscle tone.  PSYCHIATRIC: Normal mood and affect. Normal behavior. Cooperative  CARDIAC DATABASE: EKG: July 26, 2022: Sinus rhythm, 60 bpm, without underlying ischemia injury pattern  Echocardiogram: 05/01/20 (Care Everywhere OV 08/04/2020) 1. Technically difficult study. 2. The left ventricle is not well visualized but probably normal in size with mildly increased wall thickness. 3. Overall left ventricular systolic function appears normal. 4. The left atrium is moderately dilated in size. 5. The right ventricle is mildly dilated in size. 6. The right atrium is mildly dilated in size. 7. Gradient and calculations are suggestive of some degree of aortic stenosis (possibly moderate).  04/05/2021: Technically difficult study despite Definity given the body habitus, probably normal LVEF 55-60% indeterminate diastolic filling pattern.  Stress Testing: No results found for this or any previous visit from the past 1095 days.  Heart Catheterization: None  LABORATORY DATA:    Latest Ref Rng & Units 04/01/2021    5:24 AM 03/31/2021   11:20 AM 04/01/2019    8:43 AM  CBC  WBC 4.0 - 10.5 K/uL 9.1  8.0  9.0   Hemoglobin 13.0 - 17.0 g/dL 16.1  09.6  04.5   Hematocrit 39.0 - 52.0 % 44.9  47.0  45.6   Platelets 150 - 400 K/uL 316  303  316        Latest Ref Rng & Units 01/10/2022   10:15 AM 06/09/2021    8:29 AM 05/10/2021    8:08 AM  CMP  Glucose 70 - 99 mg/dL 409  811  93   BUN 6 - 24 mg/dL 13  8  17    Creatinine 0.76 - 1.27 mg/dL 9.14  7.82  9.56   Sodium 134 - 144 mmol/L 142  141  142   Potassium 3.5 - 5.2 mmol/L 5.2  4.3  6.0   Chloride 96 - 106 mmol/L 104  100  102   CO2 20 - 29 mmol/L 23  26  27    Calcium 8.7 - 10.2 mg/dL 21.3  08.6  9.9   Total Protein 6.0 - 8.5 g/dL 6.4   6.8   Total Bilirubin 0.0 - 1.2 mg/dL 0.4   0.4   Alkaline Phos 44 - 121 IU/L 70   71   AST 0 - 40 IU/L 23   28   ALT 0 - 44 IU/L 22   21     Lipid Panel  Lab Results  Component Value Date   CHOL 123 01/10/2022   HDL 42 01/10/2022   LDLCALC 65 01/10/2022   TRIG 82 01/10/2022   CHOLHDL 2.9 01/10/2022    No  components found for: "NTPROBNP" No results for input(s): "PROBNP" in the last 8760 hours.  Recent Labs    01/10/22 1015  TSH 1.500    BMP Recent Labs    01/10/22 1015  NA 142  K 5.2  CL 104  CO2 23  GLUCOSE 106*  BUN 13  CREATININE 0.82  CALCIUM 10.1    HEMOGLOBIN A1C Lab Results  Component Value Date   HGBA1C 7.2 (A) 04/20/2022   MPG 197 10/10/2018    External Labs:  Date Collected: 04/20/2020 , information obtained by care everywhere Potassium: 4.9 Creatinine 1.16 mg/dL. eGFR: 70 mL/min per 1.73 m Lipid profile: Total cholesterol 144 , triglycerides 114 , HDL 39 , LDL 82 AST: 34 , ALT: 39 , alkaline phosphatase: 93   Date Collected: 03/25/2020 , information obtained by Care Everywhere Hemoglobin A1c: 7.8  Date Collected: 05/05/2020 , information obtained by Care Everywhere Hemoglobin: 14.7 g/dL and hematocrit: 09.8 %  IMPRESSION:    ICD-10-CM   1. Chronic heart failure with preserved ejection fraction (HFpEF) (HCC)  I50.32 EKG 12-Lead    Lipid Panel With LDL/HDL Ratio    LDL cholesterol, direct    CMP14+EGFR    Pro b natriuretic peptide (BNP)    2. Benign hypertension  I10     3. Type 2 diabetes mellitus without complication, with long-term current use of insulin (HCC)  E11.9    Z79.4     4. Mixed hyperlipidemia  E78.2     5. OSA treated with BiPAP  G47.33     6. Family history of heart disease  Z82.49        RECOMMENDATIONS: Aaron Potter is a 62 y.o. male whose past medical history and cardiac risk factors include: Hypertension, hyperlipidemia, insulin-dependent diabetes mellitus type 2, OSA on BiPAP, family history of heart disease, morbid obesity (Body mass index is 66.87 kg/m.).  Chronic heart failure with preserved ejection fraction (HFpEF) (HCC) Stage C, NYHA class II Last hospitalization: April 2023, diuresed 22 pounds, discharge weight 446 pounds Medications reconciled. Farxiga discontinued due to yeast infection. Recheck  labs-NT proBNP, CMP, fasting lipid, direct LDL New studies have shown that people with HFrEF may actually benefit from GLP-1 agonist.  Other indications for him include diabetes mellitus type 2.  Patient states that he has tried Ozempic with his endocrinologist but had to discontinue due to nausea and vomiting.  I have asked him a retrial of Ozempic would still be beneficial for considering Wegovy as an alternative.  When starting these medications consider reducing the dose of insulin and also to cut down food consumption by at least 30%.  Patient will discuss this further with his endocrinologist in the coming weeks.  Benign hypertension Office blood pressures are soft but currently asymptomatic. Will recheck blood work to make sure he is not having acute kidney injury. Further recommendations to follow  Type 2 diabetes mellitus without complication, with long-term current use of insulin (HCC) Reemphasized importance of glycemic control. Currently on Arni, statin therapy, metformin, insulin. Did not do well with SGLT2 inhibitors due to yeast infection. Follows with endocrinology.  Mixed hyperlipidemia Currently on pravastatin.   He denies myalgia or other side effects. Will check fasting lipid profile and direct LDL.  OSA treated with BiPAP Reemphasized the importance of BiPAP compliance.  FINAL MEDICATION LIST END OF ENCOUNTER: No orders of the defined types were placed in this encounter.   Medications Discontinued During This Encounter  Medication Reason   ALPRAZolam (XANAX) 0.5 MG tablet    cephALEXin (KEFLEX) 500 MG capsule    diclofenac Sodium (VOLTAREN) 1 % GEL    VENTOLIN HFA 108 (  90 Base) MCG/ACT inhaler    albuterol (PROVENTIL) (2.5 MG/3ML) 0.083% nebulizer solution       Current Outpatient Medications:    Accu-Chek FastClix Lancets MISC, USE TO CHECK BLOOD SUGAR FOUR TIMES A DAY., Disp: 102 each, Rfl: 2   allopurinol (ZYLOPRIM) 300 MG tablet, Take 300 mg by mouth  daily.  , Disp: , Rfl:    Blood Glucose Monitoring Suppl (ACCU-CHEK GUIDE) w/Device KIT, 1 Piece by Does not apply route as directed., Disp: 1 kit, Rfl: 0   gabapentin (NEURONTIN) 300 MG capsule, TAKE 1 CAPSULE TWICE DAILY, Disp: 180 capsule, Rfl: 0   glucose blood (ACCU-CHEK AVIVA PLUS) test strip, E11.65, Disp: 150 strip, Rfl: 2   Insulin Pen Needle (GLOBAL EASE INJECT PEN NEEDLES) 31G X 8 MM MISC, use 4 times daily., Disp: 300 each, Rfl: 2   insulin regular human CONCENTRATED (HUMULIN R U-500 KWIKPEN) 500 UNIT/ML KwikPen, inject 80-110 units into the skin 3 times daily with meals., Disp: 18 mL, Rfl: 1   metFORMIN (GLUCOPHAGE) 500 MG tablet, take 2 tablets by mouth twice daily., Disp: 360 tablet, Rfl: 0   Multiple Vitamin (MULTIVITAMIN WITH MINERALS) TABS tablet, Take 1 tablet by mouth daily., Disp: , Rfl:    oxyCODONE-acetaminophen (PERCOCET) 7.5-325 MG tablet, Take 1 tablet by mouth every 6 (six) hours as needed for severe pain., Disp: , Rfl:    pantoprazole (PROTONIX) 40 MG tablet, TAKE 1 TABLET DAILY, 30 MINUTES BEFORE BREAKFAST `, Disp: 90 tablet, Rfl: 3   pravastatin (PRAVACHOL) 40 MG tablet, Take 40 mg by mouth daily., Disp: , Rfl:    sacubitril-valsartan (ENTRESTO) 49-51 MG, Take 1 tablet by mouth 2 (two) times daily., Disp: 180 tablet, Rfl: 0   silver sulfADIAZINE (SILVADENE) 1 % cream, Apply 1 application  topically daily., Disp: , Rfl:    spironolactone (ALDACTONE) 25 MG tablet, take 1 tablet once daily., Disp: 30 tablet, Rfl: 0   torsemide (DEMADEX) 10 MG tablet, take 1 tablet by mouth once daily., Disp: 30 tablet, Rfl: 0   Vitamin D, Ergocalciferol, 50000 units CAPS, take 1 capsule by mouth once a week., Disp: 12 capsule, Rfl: 0   bumetanide (BUMEX) 1 MG tablet, Take 1 tablet (1 mg total) by mouth daily., Disp: 30 tablet, Rfl: 11   verapamil (CALAN-SR) 120 MG CR tablet, Take 1 tablet (120 mg total) by mouth daily., Disp: 30 tablet, Rfl: 0  Orders Placed This Encounter  Procedures    Lipid Panel With LDL/HDL Ratio   LDL cholesterol, direct   [id number]   Pro b natriuretic peptide (BNP)   EKG 12-Lead    There are no Patient Instructions on file for this visit.   --Continue cardiac medications as reconciled in final medication list. --Return in about 6 months (around 01/26/2023) for Follow up, heart failure management.. Or sooner if needed. --Continue follow-up with your primary care physician regarding the management of your other chronic comorbid conditions.  Patient's questions and concerns were addressed to his satisfaction. He voices understanding of the instructions provided during this encounter.   This note was created using a voice recognition software as a result there may be grammatical errors inadvertently enclosed that do not reflect the nature of this encounter. Every attempt is made to correct such errors.  Tessa Lerner, [place], [facility]  Pager:  [phone] Office: [phone]

## 2022-08-11 ENCOUNTER — Other Ambulatory Visit: Payer: Self-pay | Admitting: "Endocrinology

## 2022-08-11 DIAGNOSIS — E1165 Type 2 diabetes mellitus with hyperglycemia: Secondary | ICD-10-CM

## 2022-08-15 ENCOUNTER — Other Ambulatory Visit: Payer: Self-pay

## 2022-08-15 ENCOUNTER — Ambulatory Visit: Payer: Medicaid Other | Admitting: Pulmonary Disease

## 2022-08-15 ENCOUNTER — Encounter: Payer: Self-pay | Admitting: Pulmonary Disease

## 2022-08-15 VITALS — BP 123/68 | HR 72 | Ht 69.0 in | Wt >= 6400 oz

## 2022-08-15 DIAGNOSIS — I5032 Chronic diastolic (congestive) heart failure: Secondary | ICD-10-CM

## 2022-08-15 DIAGNOSIS — J9611 Chronic respiratory failure with hypoxia: Secondary | ICD-10-CM

## 2022-08-15 DIAGNOSIS — G4733 Obstructive sleep apnea (adult) (pediatric): Secondary | ICD-10-CM

## 2022-08-15 DIAGNOSIS — E662 Morbid (severe) obesity with alveolar hypoventilation: Secondary | ICD-10-CM

## 2022-08-15 MED ORDER — ALBUTEROL SULFATE HFA 108 (90 BASE) MCG/ACT IN AERS: 1.0000 | INHALATION_SPRAY | Freq: Four times a day (QID) | RESPIRATORY_TRACT | 5 refills | Status: AC | PRN

## 2022-08-15 NOTE — Progress Notes (Signed)
[facility]  Chief Complaint  Patient presents with   Sleep Apnea    Past Surgical History:  He  has a past surgical history that includes Cholecystectomy; Foot surgery (Right, 04/2017); and ERCP (1998).  Past Medical History:  Allergies, Anxiety, Asthma, Back pain, DM type 2, HTN, GERD, Gout, Lichen sclerosus, HLD, Arthritis, Urticaria, Varicose veins  Constitutional:  BP 123/68   Pulse 72   Ht 5\' 9"  (1.753 m)   Wt (!) 456 lb (206.8 kg)   SpO2 93%   BMI 67.34 kg/m   Brief Summary:  Aaron Potter is a 62 y.o. male with asthma, obstructive sleep apnea and obesity hypoventilation syndrome.      Subjective:   He is here with his wife.   Sleep study from 2012 showed severe sleep apnea.  Titration study from 2023 showed good control with Bipap and oxygen.  Using 2 liters oxygen at night with Bipap.  No issues with mask fit.  He gets chest tightness intermittently.  Had an albuterol inhaler and this helped.  Needs a refill.  Has trouble around strong smells.  He tried ozempic, but caused nausea and vomiting.  He didn't realize he needed to change his diet.  He will talk to his diabetes doctor about trying a different medication for his diabetes to help with weight loss.  Physical Exam:   Appearance - well kempt   ENMT - no sinus tenderness, no oral exudate, no LAN, Mallampati 4 airway, no stridor  Respiratory - equal breath sounds bilaterally, no wheezing or rales  CV - s1s2 regular rate and rhythm, no murmurs  Ext - no clubbing, no edema  Skin - no rashes  Psych - normal mood and affect   Pulmonary testing:  PFT 06/25/19 >> FEV1 2.49 (64%), FEV1% 81, TLC 5.25 (73%), DLCO 84%  Chest Imaging:  CT angio chest 03/03/21 >> esophageal thickening, hepatosplenomegaly  Sleep Tests:  PSG 10/19/10 >> AHI 133, SpO2 low 81% Bipap titration 05/21/21 >> Bipap 17/13 with 2 liters oxygen Auto Bipap 07/16/22 to 08/14/22 >> used on 30  of 30 nights with average 9 hrs 7 min.  Average AHI 0.3 with median Bipap 16/12 and 95 th percentile Bipap 18/14 cm H2O  Cardiac Tests:  Echo 04/05/21 >> EF 55 to 60%  Social History:  He  reports that he has never smoked. He has never used smokeless tobacco. He reports that he does not drink alcohol and does not use drugs.  Family History:  His family history includes Cancer in his mother; Diabetes in his father and mother; Heart attack in his father; Urticaria in his sister.     Assessment/Plan:   Obstructive sleep apnea. - he is compliant with Bipap and reports benefit from therapy - uses [facility] for his DME - current Bipap ordered June 2023 - continue auto Bipap with max IPAP 20, min EPAP 10, PS 4 cm H2O  Chronic hypoxic respiratory failure. - from obesity hypoventilation syndrome - continue 2 liters oxygen at night with Bipap  Morbid obesity. - discussed options to help with diet modification - he will speak with his endocrinologist to discuss options for weight loss medications  Mild intermittent asthma. - prn albuterol  Chronic combined CHF with recovered EF. - followed by cardiology with [facility]  Time Spent Involved in Patient Care on Day of Examination:  38 minutes  Follow up:   Patient Instructions  Follow up in 6 months  Medication List:  Allergies as of 08/15/2022       Reactions   Biaxin [clarithromycin] Anaphylaxis, Shortness Of Breath   Ace Inhibitors    D/c ACEi 05/20/2019 > changed to clonidine for pain control component  Although even in retrospect it may not be clear the ACEi contributed to the pt's symptoms, adding them back at this point or in the future would risk confusion in interpretation of non-specific respiratory symptoms to which this patient is prone ie Better not to muddy the waters here.     Farxiga [dapagliflozin] Other (See Comments)        Medication List        Accurate as of August 15, 2022  9:58 AM. If you have  any questions, ask your nurse or doctor.          STOP taking these medications    oxyCODONE-acetaminophen 7.5-325 MG tablet Commonly known as: PERCOCET Stopped by: Coralyn Helling       TAKE these medications    Accu-Chek Aviva Plus test strip Generic drug: glucose blood E11.65   Accu-Chek FastClix Lancets Misc USE TO CHECK BLOOD SUGAR FOUR TIMES A DAY.   Accu-Chek Guide w/Device Kit 1 Piece by Does not apply route as directed.   albuterol 108 (90 Base) MCG/ACT inhaler Commonly known as: Ventolin HFA Inhale 1-2 puffs into the lungs every 6 (six) hours as needed for wheezing or shortness of breath. Started by: Coralyn Helling   allopurinol 300 MG tablet Commonly known as: ZYLOPRIM Take 300 mg by mouth daily.   bumetanide 1 MG tablet Commonly known as: BUMEX Take 1 tablet (1 mg total) by mouth daily.   cetirizine 10 MG tablet Commonly known as: ZYRTEC Take 10 mg by mouth daily.   DULoxetine 20 MG capsule Commonly known as: CYMBALTA Take 20 mg by mouth daily.   gabapentin 300 MG capsule Commonly known as: NEURONTIN TAKE 1 CAPSULE TWICE DAILY   Global Ease Inject Pen Needles 31G X 8 MM Misc Generic drug: Insulin Pen Needle use 4 times daily.   HumuLIN R U-500 KwikPen 500 UNIT/ML KwikPen Generic drug: insulin regular human CONCENTRATED inject 80-110 units into the skin 3 times daily with meals.   metFORMIN 500 MG tablet Commonly known as: GLUCOPHAGE take 2 tablets by mouth twice daily.   multivitamin with minerals Tabs tablet Take 1 tablet by mouth daily.   Oxycodone HCl 10 MG Tabs Take 10 mg by mouth 4 (four) times daily as needed.   pantoprazole 40 MG tablet Commonly known as: PROTONIX TAKE 1 TABLET DAILY, 30 MINUTES BEFORE BREAKFAST `   pravastatin 40 MG tablet Commonly known as: PRAVACHOL Take 40 mg by mouth daily.   sacubitril-valsartan 49-51 MG Commonly known as: ENTRESTO Take 1 tablet by mouth 2 (two) times daily.   silver sulfADIAZINE 1 %  cream Commonly known as: SILVADENE Apply 1 application  topically daily.   spironolactone 25 MG tablet Commonly known as: ALDACTONE take 1 tablet once daily.   torsemide 10 MG tablet Commonly known as: DEMADEX take 1 tablet by mouth once daily.   verapamil 120 MG CR tablet Commonly known as: CALAN-SR Take 1 tablet (120 mg total) by mouth daily.   Vitamin D (Ergocalciferol) 50000 units Caps take 1 capsule by mouth once a week.        Signature:  Coralyn Helling, MD [facility]monary/Critical Care Pager - [phone] 08/15/2022, 9:58 AM

## 2022-08-15 NOTE — Patient Instructions (Signed)
Follow up in 6 months 

## 2022-08-22 ENCOUNTER — Encounter: Payer: Self-pay | Admitting: "Endocrinology

## 2022-08-22 ENCOUNTER — Ambulatory Visit: Payer: Medicaid Other | Admitting: "Endocrinology

## 2022-08-22 VITALS — BP 124/58 | HR 68 | Ht 69.0 in | Wt >= 6400 oz

## 2022-08-22 DIAGNOSIS — E1169 Type 2 diabetes mellitus with other specified complication: Secondary | ICD-10-CM

## 2022-08-22 DIAGNOSIS — Z9189 Other specified personal risk factors, not elsewhere classified: Secondary | ICD-10-CM

## 2022-08-22 DIAGNOSIS — Z794 Long term (current) use of insulin: Secondary | ICD-10-CM

## 2022-08-22 DIAGNOSIS — I10 Essential (primary) hypertension: Secondary | ICD-10-CM | POA: Diagnosis not present

## 2022-08-22 DIAGNOSIS — E782 Mixed hyperlipidemia: Secondary | ICD-10-CM | POA: Diagnosis not present

## 2022-08-22 LAB — POCT GLYCOSYLATED HEMOGLOBIN (HGB A1C): HbA1c, POC (controlled diabetic range): 7 % (ref 0.0–7.0)

## 2022-08-22 MED ORDER — OZEMPIC (0.25 OR 0.5 MG/DOSE) 2 MG/3ML ~~LOC~~ SOPN: 0.2500 mg | PEN_INJECTOR | SUBCUTANEOUS | 0 refills | Status: DC

## 2022-08-22 MED ORDER — HUMULIN R U-500 KWIKPEN 500 UNIT/ML ~~LOC~~ SOPN
PEN_INJECTOR | SUBCUTANEOUS | 0 refills | Status: DC
Start: 2022-08-22 — End: 2022-11-14

## 2022-08-22 NOTE — Patient Instructions (Signed)
                                     Advice for Weight Management  -For most of us the best way to lose weight is by diet management. Generally speaking, diet management means consuming less calories intentionally which over time brings about progressive weight loss.  This can be achieved more effectively by avoiding ultra processed carbohydrates, processed meats, unhealthy fats.    It is critically important to know your numbers: how much calorie you are consuming and how much calorie you need. More importantly, our carbohydrates sources should be unprocessed naturally occurring  complex starch food items.  It is always important to balance nutrition also by  appropriate intake of proteins (mainly plant-based), healthy fats/oils, plenty of fruits and vegetables.   -The American College of Lifestyle Medicine (ACL M) recommends nutrition derived mostly from Whole Food, Plant Predominant Sources example an apple instead of applesauce or apple pie. Eat Plenty of vegetables, Mushrooms, fruits, Legumes, Whole Grains, Nuts, seeds in lieu of processed meats, processed snacks/pastries red meat, poultry, eggs.  Use only water or unsweetened tea for hydration.  The College also recommends the need to stay away from risky substances including alcohol, smoking; obtaining 7-9 hours of restorative sleep, at least 150 minutes of moderate intensity exercise weekly, importance of healthy social connections, and being mindful of stress and seek help when it is overwhelming.    -Sticking to a routine mealtime to eat 3 meals a day and avoiding unnecessary snacks is shown to have a big role in weight control. Under normal circumstances, the only time we burn stored energy is when we are hungry, so allow  some hunger to take place- hunger means no food between appropriate meal times, only water.  It is not advisable to starve.   -It is better to avoid simple carbohydrates including:  Cakes, Sweet Desserts, Ice Cream, Soda (diet and regular), Sweet Tea, Candies, Chips, Cookies, Store Bought Juices, Alcohol in Excess of  1-2 drinks a day, Lemonade,  Artificial Sweeteners, Doughnuts, Coffee Creamers, "Sugar-free" Products, etc, etc.  This is not a complete list.....    -Consulting with certified diabetes educators is proven to provide you with the most accurate and current information on diet.  Also, you may be  interested in discussing diet options/exchanges , we can schedule a visit with Aaron Potter, RDN, CDE for individualized nutrition education.  -Exercise: If you are able: 30 -60 minutes a day ,4 days a week, or 150 minutes of moderate intensity exercise weekly.    The longer the better if tolerated.  Combine stretch, strength, and aerobic activities.  If you were told in the past that you have high risk for cardiovascular diseases, or if you are currently symptomatic, you may seek evaluation by your heart doctor prior to initiating moderate to intense exercise programs.                                  Additional Care Considerations for Diabetes/Prediabetes   -Diabetes  is a chronic disease.  The most important care consideration is regular follow-up with your diabetes care provider with the goal being avoiding or delaying its complications and to take advantage of advances in medications and technology.  If appropriate actions are taken early enough, type 2 diabetes can even be   reversed.  Seek information from the right source.  - Whole Food, Plant Predominant Nutrition is highly recommended: Eat Plenty of vegetables, Mushrooms, fruits, Legumes, Whole Grains, Nuts, seeds in lieu of processed meats, processed snacks/pastries red meat, poultry, eggs as recommended by American College of  Lifestyle Medicine (ACLM).  -Type 2 diabetes is known to coexist with other important comorbidities such as high blood pressure and high cholesterol.  It is critical to control not only the  diabetes but also the high blood pressure and high cholesterol to minimize and delay the risk of complications including coronary artery disease, stroke, amputations, blindness, etc.  The good news is that this diet recommendation for type 2 diabetes is also very helpful for managing high cholesterol and high blood blood pressure.  - Studies showed that people with diabetes will benefit from a class of medications known as ACE inhibitors and statins.  Unless there are specific reasons not to be on these medications, the standard of care is to consider getting one from these groups of medications at an optimal doses.  These medications are generally considered safe and proven to help protect the heart and the kidneys.    - People with diabetes are encouraged to initiate and maintain regular follow-up with eye doctors, foot doctors, dentists , and if necessary heart and kidney doctors.     - It is highly recommended that people with diabetes quit smoking or stay away from smoking, and get yearly  flu vaccine and pneumonia vaccine at least every 5 years.  See above for additional recommendations on exercise, sleep, stress management , and healthy social connections.      

## 2022-08-22 NOTE — Progress Notes (Signed)
08/22/2022  Endocrinology follow-up note  Subjective:    Patient ID: Aaron Potter, male    DOB: 04-29-1960, PCP Catalina Lunger, DO   Past Medical History:  Diagnosis Date   Allergy    Anxiety disorder, unspecified    Asthma    Back pain    Balanitis    Cellulitis    Essential (primary) hypertension    Gastro-esophageal reflux disease with esophagitis    Gout    Lichen sclerosus    Mixed hyperlipidemia    Morbid (severe) obesity with alveolar hypoventilation (HCC)    Obesity, morbid (more than 100 lbs over ideal weight or BMI > 40) (HCC)    Polyosteoarthritis, unspecified    Sleep apnea    Type 2 diabetes mellitus with hyperglycemia (HCC)    Urticaria    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    Past Surgical History:  Procedure Laterality Date   CHOLECYSTECTOMY     ERCP  1998   [facility] stone s/p cholecystectomy   FOOT SURGERY Right 04/2017   Social History   Socioeconomic History   Marital status: Single    Spouse name: Not on file   Number of children: 0   Years of education: Not on file   Highest education level: Not on file  Occupational History   Not on file  Tobacco Use   Smoking status: Never   Smokeless tobacco: Never  Vaping Use   Vaping status: Never Used  Substance and Sexual Activity   Alcohol use: No    Alcohol/week: 0.0 standard drinks of alcohol   Drug use: No   Sexual activity: Yes  Other Topics Concern   Not on file  Social History Narrative   Not on file   Social Determinants of Health   Financial Resource Strain: Low Risk  (07/26/2021)   Received from [facility], [facility]   Overall Financial Resource Strain (CARDIA)    Difficulty of Paying Living Expenses: Not hard at all  Food Insecurity: No Food Insecurity (07/26/2021)   Received from [facility], [facility]   Hunger Vital Sign    Worried About Running Out of Food in the Last Year: Never true    Ran Out of Food in the Last  Year: Never true  Transportation Needs: No Transportation Needs (02/24/2021)   Received from [facility], [facility]   [facility] - Transportation    Lack of Transportation (Medical): No    Lack of Transportation (Non-Medical): No  Physical Activity: Not on file  Stress: Not on file  Social Connections: Not on file   Outpatient Encounter Medications as of 08/22/2022  Medication Sig   Semaglutide,0.25 or 0.5MG /DOS, (OZEMPIC, 0.25 OR 0.5 MG/DOSE,) 2 MG/3ML SOPN Inject 0.25 mg into the skin once a week.   Accu-Chek FastClix Lancets MISC USE TO CHECK BLOOD SUGAR FOUR TIMES A DAY.   albuterol (VENTOLIN HFA) 108 (90 Base) MCG/ACT inhaler Inhale 1-2 puffs into the lungs every 6 (six) hours as needed for wheezing or shortness of breath.   allopurinol (ZYLOPRIM) 300 MG tablet Take 300 mg by mouth daily.     Blood Glucose Monitoring Suppl (ACCU-CHEK GUIDE) w/Device KIT 1 Piece by Does not apply route as directed.   bumetanide (BUMEX) 1 MG tablet Take 1 tablet (1 mg total) by mouth daily.   cetirizine (ZYRTEC) 10 MG tablet Take 10 mg by mouth daily.   DULoxetine (CYMBALTA) 20 MG capsule Take  20 mg by mouth daily.   gabapentin (NEURONTIN) 300 MG capsule TAKE 1 CAPSULE TWICE DAILY   glucose blood (ACCU-CHEK AVIVA PLUS) test strip E11.65   Insulin Pen Needle (GLOBAL EASE INJECT PEN NEEDLES) 31G X 8 MM MISC use 4 times daily.   insulin regular human CONCENTRATED (HUMULIN R U-500 KWIKPEN) 500 UNIT/ML KwikPen INJECT 80-100 UNITS INTO THE SKIN 3 TIMES DAILY WITH MEALS.   metFORMIN (GLUCOPHAGE) 500 MG tablet take 2 tablets by mouth twice daily.   Multiple Vitamin (MULTIVITAMIN WITH MINERALS) TABS tablet Take 1 tablet by mouth daily.   Oxycodone HCl 10 MG TABS Take 10 mg by mouth 4 (four) times daily as needed.   pantoprazole (PROTONIX) 40 MG tablet TAKE 1 TABLET DAILY, 30 MINUTES BEFORE BREAKFAST `   pravastatin (PRAVACHOL) 40 MG tablet Take 40 mg by mouth daily.   sacubitril-valsartan (ENTRESTO) 49-51  MG Take 1 tablet by mouth 2 (two) times daily.   silver sulfADIAZINE (SILVADENE) 1 % cream Apply 1 application  topically daily.   spironolactone (ALDACTONE) 25 MG tablet take 1 tablet once daily.   torsemide (DEMADEX) 10 MG tablet take 1 tablet by mouth once daily.   verapamil (CALAN-SR) 120 MG CR tablet Take 1 tablet (120 mg total) by mouth daily.   Vitamin D, Ergocalciferol, 50000 units CAPS take 1 capsule by mouth once a week.   [DISCONTINUED] insulin regular human CONCENTRATED (HUMULIN R U-500 KWIKPEN) 500 UNIT/ML KwikPen inject 80-110 units into the skin 3 times daily with meals.   No facility-administered encounter medications on file as of 08/22/2022.   ALLERGIES: Allergies  Allergen Reactions   Biaxin [Clarithromycin] Anaphylaxis and Shortness Of Breath   Ace Inhibitors     D/c ACEi 05/20/2019 > changed to clonidine for pain control component   Although even in retrospect it may not be clear the ACEi contributed to the pt's symptoms, adding them back at this point or in the future would risk confusion in interpretation of non-specific respiratory symptoms to which this patient is prone ie Better not to muddy the waters here.     Marcelline Deist [Dapagliflozin] Other (See Comments)   VACCINATION STATUS: Immunization History  Administered Date(s) Administered   Influenza Inj Mdck Quad Pf 10/31/2019, 11/12/2020   Influenza-Unspecified 10/18/2010, 10/18/2011, 10/18/2012, 12/11/2013, 09/11/2014, 10/13/2015, 09/06/2018   Moderna Sars-Covid-2 Vaccination 04/01/2019, 04/29/2019   PNEUMOCOCCAL CONJUGATE-20 05/08/2020   Tdap 04/13/2016    Diabetes He presents for his follow-up diabetic visit. He has type 2 diabetes mellitus. Onset time: He was diagnosed at approximate age of 39 years. His disease course has been improving. There are no hypoglycemic associated symptoms. Pertinent negatives for hypoglycemia include no confusion, headaches, pallor or seizures. Associated symptoms include polydipsia  and polyuria. Pertinent negatives for diabetes include no chest pain, no fatigue, no polyphagia and no weakness. There are no hypoglycemic complications. Symptoms are stable. Diabetic complications include PVD. Risk factors for coronary artery disease include dyslipidemia, diabetes mellitus, family history, male sex, obesity and sedentary lifestyle. Current diabetic treatment includes intensive insulin program. He is compliant with treatment most of the time. His weight is increasing steadily. He is following a generally unhealthy diet. When asked about meal planning, he reported none. He has had a previous visit with a dietitian. He never participates in exercise. His home blood glucose trend is decreasing steadily. His breakfast blood glucose range is generally 140-180 mg/dl. His lunch blood glucose range is generally 140-180 mg/dl. His dinner blood glucose range is generally 140-180 mg/dl. His  bedtime blood glucose range is generally 140-180 mg/dl. His overall blood glucose range is 140-180 mg/dl. Hains presents with his aide to clinic.  His logs show near target glycemic profile.  His point-of-care A1c is 7%, improving.  He did not document any hypoglycemia.   ) An ACE inhibitor/angiotensin II receptor blocker is being taken.  Hyperlipidemia This is a chronic problem. The current episode started more than 1 year ago. The problem is uncontrolled. Exacerbating diseases include diabetes and obesity. Associated symptoms include shortness of breath. Pertinent negatives include no chest pain or myalgias. Current antihyperlipidemic treatment includes statins. Risk factors for coronary artery disease include diabetes mellitus, dyslipidemia, hypertension, male sex, obesity and a sedentary lifestyle.  Hypertension This is a chronic problem. The current episode started more than 1 year ago. The problem is uncontrolled. Associated symptoms include shortness of breath. Pertinent negatives include no chest pain,  headaches, neck pain or palpitations. Risk factors for coronary artery disease include diabetes mellitus, dyslipidemia, obesity, male gender, sedentary lifestyle and family history. Past treatments include ACE inhibitors and calcium channel blockers. The current treatment provides no improvement. Compliance problems include diet.  Hypertensive end-organ damage includes PVD.     Review of systems  Limited as above.   Objective:    BP (!) 124/58   Pulse 68   Ht 5\' 9"  (1.753 m)   Wt (!) 453 lb 3.2 oz (205.6 kg)   BMI 66.93 kg/m   Wt Readings from Last 3 Encounters:  08/22/22 (!) 453 lb 3.2 oz (205.6 kg)  08/15/22 (!) 456 lb (206.8 kg)  07/26/22 (!) 452 lb 12.8 oz (205.4 kg)     Physical Exam- Limited  Edematous lower extremities. Calluses, poor circulation on bilateral lower extremities.  Diminished monofilament test. Results for orders placed or performed in visit on 08/22/22  HgB A1c  Result Value Ref Range   Hemoglobin A1C     HbA1c POC (<> result, manual entry)     HbA1c, POC (prediabetic range)     HbA1c, POC (controlled diabetic range) 7.0 0.0 - 7.0 %   Diabetic Labs (most recent): Lab Results  Component Value Date   HGBA1C 7.0 08/22/2022   HGBA1C 7.2 (A) 04/20/2022   HGBA1C 7.3 (A) 01/19/2022   MICROALBUR 0.8 06/11/2018   MICROALBUR 5.3 10/15/2016   MICROALBUR 1.4 03/12/2015   Lipid Panel     Component Value Date/Time   CHOL 134 08/15/2022 0838   TRIG 108 08/15/2022 0838   HDL 43 08/15/2022 0838   CHOLHDL 3.1 08/15/2022 0838   CHOLHDL 3.8 04/01/2019 0843   VLDL 31 (H) 03/12/2015 1200   LDLCALC 71 08/15/2022 0838   LDLCALC 79 04/01/2019 0843   LDLDIRECT 90 08/15/2022 0832      Latest Ref Rng & Units 08/15/2022    8:38 AM 08/15/2022    8:32 AM 01/10/2022   10:15 AM  CMP  Glucose 70 - 99 mg/dL 191  96  478   BUN 6 - 24 mg/dL 11  15  13    Creatinine 0.76 - 1.27 mg/dL 2.95  6.21  3.08   Sodium 134 - 144 mmol/L 140  142  142   Potassium 3.5 - 5.2 mmol/L  4.8  3.9  5.2   Chloride 96 - 106 mmol/L 99  102  104   CO2 20 - 29 mmol/L 26  22  23    Calcium 8.7 - 10.2 mg/dL 65.7  9.0  84.6   Total Protein 6.0 - 8.5 g/dL 6.5  6.7  6.4   Total Bilirubin 0.0 - 1.2 mg/dL 0.5  0.5  0.4   Alkaline Phos 44 - 121 IU/L 73  79  70   AST 0 - 40 IU/L 28  25  23    ALT 0 - 44 IU/L 24  21  22       Assessment & Plan:   1. Uncontrolled type 2 diabetes mellitus with diabetic peripheral angiopathy without gangrene, with long-term current use of insulin (HCC)  Behruz presents with his aide to clinic.  His logs show near target glycemic profile.  His point-of-care A1c is 7%, improving.  He did not document any hypoglycemia.    He remains at extremely high risk for more acute and chronic complications of diabetes which include CAD, CVA, CKD, retinopathy, and neuropathy. These are all discussed in detail with the patient.   Glucose logs and insulin administration records pertaining to this visit,  to be scanned into patient's records.  Recent labs reviewed.  - he acknowledges that there is a room for improvement in his food and drink choices. - Suggestion is made for him to avoid simple carbohydrates  from his diet including Cakes, Sweet Desserts, Ice Cream, Soda (diet and regular), Sweet Tea, Candies, Chips, Cookies, Store Bought Juices, Alcohol , Artificial Sweeteners,  Coffee Creamer, and "Sugar-free" Products, Lemonade. This will help patient to have more stable blood glucose profile and potentially avoid unintended weight gain.  The following Lifestyle Medicine recommendations according to American College of Lifestyle Medicine  [facility]) were discussed and and offered to patient and he  agrees to start the journey:  A. Whole Foods, Plant-Based Nutrition comprising of fruits and vegetables, plant-based proteins, whole-grain carbohydrates was discussed in detail with the patient.   A list for source of those nutrients were also provided to the patient.  Patient will use  only water or unsweetened tea for hydration. B.  The need to stay away from risky substances including alcohol, smoking; obtaining 7 to 9 hours of restorative sleep, at least 150 minutes of moderate intensity exercise weekly, the importance of healthy social connections,  and stress management techniques were discussed. C.  A full color page of  Calorie density of various food groups per pound showing examples of each food groups was provided to the patient.   - Patient is advised to stick to a routine mealtimes to eat 3 meals  a day and avoid unnecessary snacks ( to snack only to correct hypoglycemia).   -He did not make major changes in the recommended Plant predominant Whole Foods lifestyle nutrition. -Previously did not tolerate the side effects of Ozempic.  He would like to try low-dose Ozempic again.  I discussed initiated Ozempic 0.25 mg subcutaneously weekly.  Side effects and precautions discussed with him.  -For now, he is advised to lower his Humulin U500  to 100 units with breakfast, 100 units with lunch, 80 units with supper  for pre-meal blood glucose readings of 90 or above milligrams per deciliter associated with strict monitoring of blood glucose 4 times a day-before meals and at bedtime.    -Patient is encouraged to call clinic for blood glucose levels less than 70 or above 300 mg /dl. -He is advised to continue Metformin 1000 mg p.o. twice daily-with breakfast and supper.    -He did not tolerate Marcelline Deist, which he has stopped  taking already.    -He is following with Norm Salt, CDE for DM education.  - Patient specific target  for A1c; LDL, HDL, Triglycerides,  were discussed in detail.  2) BP/HTN: -His blood pressure is controlled to target.  Patient admits he has not taken his blood pressure medications this morning.    He is advised to proceed with his benazepril to 10 mg p.o. daily, also has verapamil 240 mg p.o. daily for blood pressure treatment.   3)  Lipids/HPL: He has uncontrolled LDL, however improving LDL at 79 from 99 .  He is advised to continue lovastatin 40 mg p.o. nightly.     Side effects and precautions discussed with him.     4)  Weight/Diet: His BMI is 66.93-clearly complicating his diabetes care.  He is a candidate for major weight loss.   He has had no significant success in weight control.  He still admits to dietary indiscretion.   CDE consult in progress, exercise, and carbohydrates information provided.  He hesitates to consider bariatric surgery. He has gotten a pair of diabetes shoes.   5) Chronic Care/Health Maintenance:  -Patient  is  on ACEI/ARB and Statin medications and encouraged to continue to follow up with Ophthalmology, Podiatrist at least yearly or according to recommendations, and advised to  stay away from smoking. I have recommended yearly flu vaccine and pneumonia vaccination at least every 5 years; and  sleep for at least 7 hours a day. -This patient cannot exercise optimally due to his heavy weight and comorbidities.  - I advised patient to maintain close follow up with Catalina Lunger, DO for primary care needs.   I spent 41  minutes in the care of the patient today including review of labs from CMP, Lipids, Thyroid Function, Hematology (current and previous including abstractions from other facilities); face-to-face time discussing  his blood glucose readings/logs, discussing hypoglycemia and hyperglycemia episodes and symptoms, medications doses, his options of short and long term treatment based on the latest standards of care / guidelines;  discussion about incorporating lifestyle medicine;  and documenting the encounter. Risk reduction counseling performed per USPSTF guidelines to reduce  obesity and cardiovascular risk factors.     Please refer to Patient Instructions for Blood Glucose Monitoring and Insulin/Medications Dosing Guide"  in media tab for additional information. Please  also refer to "  Patient Self Inventory" in the Media  tab for reviewed elements of pertinent patient history.  Aaron Potter participated in the discussions, expressed understanding, and voiced agreement with the above plans.  All questions were answered to his satisfaction. he is encouraged to contact clinic should he have any questions or concerns prior to his return visit.   Follow up plan: -Return in about 4 months (around 12/22/2022) for Bring Meter/CGM Device/Logs- A1c in Office.  Marquis Lunch, MD Phone: [phone]  Fax: [phone]  -  This note was partially dictated with voice recognition software. Similar sounding words can be transcribed inadequately or may not  be corrected upon review.  08/22/2022, 2:42 PM

## 2022-08-23 NOTE — Progress Notes (Signed)
Called patient no answer.  Left a vm

## 2022-08-24 ENCOUNTER — Other Ambulatory Visit: Payer: Self-pay | Admitting: Cardiology

## 2022-08-24 ENCOUNTER — Other Ambulatory Visit: Payer: Self-pay | Admitting: Podiatry

## 2022-08-24 DIAGNOSIS — I5032 Chronic diastolic (congestive) heart failure: Secondary | ICD-10-CM

## 2022-08-24 NOTE — Progress Notes (Signed)
Called and spoke to patient he voiced understanding. Patient stated he takes all his medications as directed.

## 2022-08-29 ENCOUNTER — Ambulatory Visit: Payer: Medicaid Other | Admitting: Podiatry

## 2022-08-29 DIAGNOSIS — E1149 Type 2 diabetes mellitus with other diabetic neurological complication: Secondary | ICD-10-CM | POA: Diagnosis not present

## 2022-08-29 DIAGNOSIS — L84 Corns and callosities: Secondary | ICD-10-CM

## 2022-08-29 DIAGNOSIS — B353 Tinea pedis: Secondary | ICD-10-CM | POA: Diagnosis not present

## 2022-08-29 DIAGNOSIS — M2041 Other hammer toe(s) (acquired), right foot: Secondary | ICD-10-CM | POA: Diagnosis not present

## 2022-08-29 DIAGNOSIS — M2042 Other hammer toe(s) (acquired), left foot: Secondary | ICD-10-CM

## 2022-08-29 MED ORDER — KETOCONAZOLE 2 % EX CREA: 1.0000 | TOPICAL_CREAM | Freq: Every day | CUTANEOUS | 0 refills | Status: DC

## 2022-08-29 NOTE — Patient Instructions (Signed)
You can increase your gabapentin to take one in the morning(300mg ) and then two tablets at night (600mg  total)  --  Gabapentin Capsules or Tablets What is this medication? GABAPENTIN (GA ba pen tin) treats nerve pain. It may also be used to prevent and control seizures in people with epilepsy. It works by calming overactive nerves in your body. This medicine may be used for other purposes; ask your health care provider or pharmacist if you have questions. COMMON BRAND NAME(S): Active-PAC with Gabapentin, Ascencion Dike, Gralise, Neurontin What should I tell my care team before I take this medication? They need to know if you have any of these conditions: Kidney disease Lung or breathing disease Substance use disorder Suicidal thoughts, plans, or attempt by you or a family member An unusual or allergic reaction to gabapentin, other medications, foods, dyes, or preservatives Pregnant or trying to get pregnant Breastfeeding How should I use this medication? Take this medication by mouth with a glass of water. Follow the directions on the prescription label. You can take it with or without food. If it upsets your stomach, take it with food. Take your medication at regular intervals. Do not take it more often than directed. Do not stop taking except on your care team's advice. If you are directed to break the 600 or 800 mg tablets in half as part of your dose, the extra half tablet should be used for the next dose. If you have not used the extra half tablet within 28 days, it should be thrown away. A special MedGuide will be given to you by the pharmacist with each prescription and refill. Be sure to read this information carefully each time. Talk to your care team about the use of this medication in children. While this medication may be prescribed for children as young as 3 years for selected conditions, precautions do apply. Overdosage: If you think you have taken too much of this medicine contact a  poison control center or emergency room at once. NOTE: This medicine is only for you. Do not share this medicine with others. What if I miss a dose? If you miss a dose, take it as soon as you can. If it is almost time for your next dose, take only that dose. Do not take double or extra doses. What may interact with this medication? Alcohol Antihistamines for allergy, cough, and cold Certain medications for anxiety or sleep Certain medications for depression like amitriptyline, fluoxetine, sertraline Certain medications for seizures like phenobarbital, primidone Certain medications for stomach problems General anesthetics like halothane, isoflurane, methoxyflurane, propofol Local anesthetics like lidocaine, pramoxine, tetracaine Medications that relax muscles for surgery Opioid medications for pain Phenothiazines like chlorpromazine, mesoridazine, prochlorperazine, thioridazine This list may not describe all possible interactions. Give your health care provider a list of all the medicines, herbs, non-prescription drugs, or dietary supplements you use. Also tell them if you smoke, drink alcohol, or use illegal drugs. Some items may interact with your medicine. What should I watch for while using this medication? Visit your care team for regular checks on your progress. You may want to keep a record at home of how you feel your condition is responding to treatment. You may want to share this information with your care team at each visit. You should contact your care team if your seizures get worse or if you have any new types of seizures. Do not stop taking this medication or any of your seizure medications unless instructed by your care team. Stopping  your medication suddenly can increase your seizures or their severity. This medication may cause serious skin reactions. They can happen weeks to months after starting the medication. Contact your care team right away if you notice fevers or flu-like  symptoms with a rash. The rash may be red or purple and then turn into blisters or peeling of the skin. Or, you might notice a red rash with swelling of the face, lips or lymph nodes in your neck or under your arms. Wear a medical identification bracelet or chain if you are taking this medication for seizures. Carry a card that lists all your medications. This medication may affect your coordination, reaction time, or judgment. Do not drive or operate machinery until you know how this medication affects you. Sit up or stand slowly to reduce the risk of dizzy or fainting spells. Drinking alcohol with this medication can increase the risk of these side effects. Your mouth may get dry. Chewing sugarless gum or sucking hard candy, and drinking plenty of water may help. Watch for new or worsening thoughts of suicide or depression. This includes sudden changes in mood, behaviors, or thoughts. These changes can happen at any time but are more common in the beginning of treatment or after a change in dose. Call your care team right away if you experience these thoughts or worsening depression. If you become pregnant while using this medication, you may enroll in the [place] American Antiepileptic Drug Pregnancy Registry by calling [phone]. This registry collects information about the safety of antiepileptic medication use during pregnancy. What side effects may I notice from receiving this medication? Side effects that you should report to your care team as soon as possible: Allergic reactions or angioedema--skin rash, itching, hives, swelling of the face, eyes, lips, tongue, arms, or legs, trouble swallowing or breathing Rash, fever, and swollen lymph nodes Thoughts of suicide or self harm, worsening mood, feelings of depression Trouble breathing Unusual changes in mood or behavior in children after use such as difficulty concentrating, hostility, or restlessness Side effects that usually do not require  medical attention (report to your care team if they continue or are bothersome): Dizziness Drowsiness Nausea Swelling of ankles, feet, or hands Vomiting This list may not describe all possible side effects. Call your doctor for medical advice about side effects. You may report side effects to FDA at 1-800-FDA-1088. Where should I keep my medication? Keep out of reach of children and pets. Store at room temperature between 15 and 30 degrees C (59 and 86 degrees F). Get rid of any unused medication after the expiration date. This medication may cause accidental overdose and death if taken by other adults, children, or pets. To get rid of medications that are no longer needed or have expired: Take the medication to a medication take-back program. Check with your pharmacy or law enforcement to find a location. If you cannot return the medication, check the label or package insert to see if the medication should be thrown out in the garbage or flushed down the toilet. If you are not sure, ask your care team. If it is safe to put it in the trash, empty the medication out of the container. Mix the medication with cat litter, dirt, coffee grounds, or other unwanted substance. Seal the mixture in a bag or container. Put it in the trash. NOTE: This sheet is a summary. It may not cover all possible information. If you have questions about this medicine, talk to your doctor, pharmacist, or  health care provider.  2024 Elsevier/Gold Standard (2021-10-05 00:00:00)

## 2022-08-29 NOTE — Progress Notes (Unsigned)
Subjective: Chief Complaint  Patient presents with   Diabetes    Pt stated that his feet are constantly burning at night    62 y.o. male presents the office with above concerns.  He did take gabapentin.  Not see much improvement with this.  This is a schizoaffective constantly burning at nighttime.  No open lesions that he reports.  He has asthma diabetic shoes.  Objective: AAO x3, NAD DP/PT pulses palpable bilaterally, CRT less than 3 seconds Sensation decreased to [facility] monofilament.  There is quite a bit of dry skin present bilaterally slight erythematous base.  There is no open lesion identified this time there is no drainage or pus.  There is no areas of ulceration or crepitation or any signs of infection.  Hyperkeratotic lesion of the heel without any underlying ulceration drainage or signs of infection. Hammertoes present. No pain with calf compression, swelling, warmth, erythema  Assessment: Neuropathy, dressing  Plan: -All treatment options discussed with the patient including all alternatives, risks, complications.  -Will increase the dose of gabapentin.  Will increase to 300 mg the morning and 2 tablets, 6 mg at nighttime.  Monitoring side effects. -Ketoconazole for the skin -I did proceed with the calluses and complications of bleeding -Prescription given for diabetic shoes. -Daily foot inspection. -Patient encouraged to call the office with any questions, concerns, change in symptoms.   Vivi Barrack DPM

## 2022-09-13 ENCOUNTER — Other Ambulatory Visit: Payer: Self-pay | Admitting: "Endocrinology

## 2022-09-14 ENCOUNTER — Other Ambulatory Visit: Payer: Self-pay | Admitting: "Endocrinology

## 2022-09-14 ENCOUNTER — Telehealth: Payer: Self-pay | Admitting: "Endocrinology

## 2022-09-14 NOTE — Telephone Encounter (Signed)
Pt left message with after hours line that he needed a refill of ozempic per his pharmacist.

## 2022-09-14 NOTE — Telephone Encounter (Signed)
This was sent to his pharmacy

## 2022-09-21 ENCOUNTER — Telehealth: Payer: Self-pay

## 2022-09-21 NOTE — Telephone Encounter (Signed)
Patient called, he is out of gabapentin and needs a refill. He wanted to remind you that you were going to increase the dosage with this refill. Thanks

## 2022-09-22 ENCOUNTER — Other Ambulatory Visit: Payer: Self-pay | Admitting: Cardiology

## 2022-09-22 ENCOUNTER — Other Ambulatory Visit: Payer: Self-pay | Admitting: Podiatry

## 2022-09-22 ENCOUNTER — Other Ambulatory Visit: Payer: Self-pay | Admitting: "Endocrinology

## 2022-09-22 DIAGNOSIS — I5032 Chronic diastolic (congestive) heart failure: Secondary | ICD-10-CM

## 2022-09-22 MED ORDER — GABAPENTIN 300 MG PO CAPS: ORAL_CAPSULE | ORAL | 0 refills | Status: DC

## 2022-10-24 ENCOUNTER — Other Ambulatory Visit: Payer: Self-pay | Admitting: "Endocrinology

## 2022-10-24 ENCOUNTER — Other Ambulatory Visit: Payer: Self-pay | Admitting: Cardiology

## 2022-10-24 DIAGNOSIS — I5032 Chronic diastolic (congestive) heart failure: Secondary | ICD-10-CM

## 2022-10-25 ENCOUNTER — Other Ambulatory Visit: Payer: Self-pay | Admitting: Podiatry

## 2022-11-14 ENCOUNTER — Other Ambulatory Visit: Payer: Self-pay | Admitting: "Endocrinology

## 2022-11-14 DIAGNOSIS — Z9189 Other specified personal risk factors, not elsewhere classified: Secondary | ICD-10-CM

## 2022-11-16 ENCOUNTER — Emergency Department (HOSPITAL_COMMUNITY): Payer: Medicaid Other

## 2022-11-16 ENCOUNTER — Other Ambulatory Visit: Payer: Self-pay

## 2022-11-16 ENCOUNTER — Encounter (HOSPITAL_COMMUNITY): Payer: Self-pay | Admitting: *Deleted

## 2022-11-16 ENCOUNTER — Emergency Department (HOSPITAL_COMMUNITY)
Admission: EM | Admit: 2022-11-16 | Discharge: 2022-11-16 | Disposition: A | Discharge status: Home / Self Care | Payer: Medicaid Other | Attending: Emergency Medicine | Admitting: Emergency Medicine

## 2022-11-16 DIAGNOSIS — R2243 Localized swelling, mass and lump, lower limb, bilateral: Secondary | ICD-10-CM | POA: Insufficient documentation

## 2022-11-16 DIAGNOSIS — J45909 Unspecified asthma, uncomplicated: Secondary | ICD-10-CM | POA: Diagnosis not present

## 2022-11-16 DIAGNOSIS — Z79899 Other long term (current) drug therapy: Secondary | ICD-10-CM | POA: Insufficient documentation

## 2022-11-16 DIAGNOSIS — I5042 Chronic combined systolic (congestive) and diastolic (congestive) heart failure: Secondary | ICD-10-CM | POA: Insufficient documentation

## 2022-11-16 DIAGNOSIS — Z794 Long term (current) use of insulin: Secondary | ICD-10-CM | POA: Diagnosis not present

## 2022-11-16 DIAGNOSIS — Z7951 Long term (current) use of inhaled steroids: Secondary | ICD-10-CM | POA: Insufficient documentation

## 2022-11-16 DIAGNOSIS — R051 Acute cough: Secondary | ICD-10-CM

## 2022-11-16 DIAGNOSIS — I110 Hypertensive heart disease with heart failure: Secondary | ICD-10-CM | POA: Diagnosis not present

## 2022-11-16 DIAGNOSIS — J449 Chronic obstructive pulmonary disease, unspecified: Secondary | ICD-10-CM | POA: Diagnosis not present

## 2022-11-16 DIAGNOSIS — U071 COVID-19: Secondary | ICD-10-CM | POA: Insufficient documentation

## 2022-11-16 DIAGNOSIS — Z7984 Long term (current) use of oral hypoglycemic drugs: Secondary | ICD-10-CM | POA: Insufficient documentation

## 2022-11-16 DIAGNOSIS — R0602 Shortness of breath: Secondary | ICD-10-CM | POA: Diagnosis present

## 2022-11-16 DIAGNOSIS — E119 Type 2 diabetes mellitus without complications: Secondary | ICD-10-CM | POA: Diagnosis not present

## 2022-11-16 HISTORY — DX: Polyneuropathy, unspecified: G62.9

## 2022-11-16 LAB — BASIC METABOLIC PANEL
Anion gap: 11 (ref 5–15)
BUN: 10 mg/dL (ref 6–20)
CO2: 23 mmol/L (ref 22–32)
Calcium: 9.1 mg/dL (ref 8.9–10.3)
Chloride: 104 mmol/L (ref 98–111)
Creatinine, Ser: 0.8 mg/dL (ref 0.61–1.24)
GFR, Estimated: >60 mL/min (ref >60)
Glucose, Bld: 207 mg/dL — ABNORMAL HIGH (ref 70–99)
Potassium: 4.4 mmol/L (ref 3.5–5.1)
Sodium: 138 mmol/L (ref 135–145)

## 2022-11-16 LAB — CBC WITH DIFFERENTIAL/PLATELET
Abs Immature Granulocytes: 0.03 10*3/uL (ref 0.00–0.07)
Basophils Absolute: 0 10*3/uL (ref 0.0–0.1)
Basophils Relative: 0 %
Eosinophils Absolute: 0.1 10*3/uL (ref 0.0–0.5)
Eosinophils Relative: 2 %
HCT: 41.5 % (ref 39.0–52.0)
Hemoglobin: 13.4 g/dL (ref 13.0–17.0)
Immature Granulocytes: 1 %
Lymphocytes Relative: 26 %
Lymphs Abs: 1.6 10*3/uL (ref 0.7–4.0)
MCH: 31.2 pg (ref 26.0–34.0)
MCHC: 32.3 g/dL (ref 30.0–36.0)
MCV: 96.7 fL (ref 80.0–100.0)
Monocytes Absolute: 0.4 10*3/uL (ref 0.1–1.0)
Monocytes Relative: 7 %
Neutro Abs: 3.9 10*3/uL (ref 1.7–7.7)
Neutrophils Relative %: 64 %
Platelets: 305 10*3/uL (ref 150–400)
RBC: 4.29 MIL/uL (ref 4.22–5.81)
RDW: 14.4 % (ref 11.5–15.5)
WBC: 6.1 10*3/uL (ref 4.0–10.5)
nRBC: 0 % (ref 0.0–0.2)

## 2022-11-16 LAB — BRAIN NATRIURETIC PEPTIDE: B Natriuretic Peptide: 73 pg/mL (ref 0.0–100.0)

## 2022-11-16 MED ORDER — HYDROCOD POLI-CHLORPHE POLI ER 10-8 MG/5ML PO SUER
5.0000 mL | Freq: Once | ORAL | Status: AC
  Administered 2022-11-16: 5 mL via ORAL
  Filled 2022-11-16: qty 5

## 2022-11-16 MED ORDER — PAXLOVID (300/100) 20 X 150 MG & 10 X 100MG PO TBPK: 3.0000 | ORAL_TABLET | Freq: Two times a day (BID) | ORAL | 0 refills | Status: AC

## 2022-11-16 MED ORDER — PROMETHAZINE-CODEINE 6.25-10 MG/5ML PO SYRP: 5.0000 mL | ORAL_SOLUTION | Freq: Once | ORAL | Status: DC

## 2022-11-16 MED ORDER — ALBUTEROL SULFATE (2.5 MG/3ML) 0.083% IN NEBU
2.5000 mg | INHALATION_SOLUTION | Freq: Four times a day (QID) | RESPIRATORY_TRACT | 12 refills | Status: AC | PRN
Start: 2022-11-16 — End: ?

## 2022-11-16 MED ORDER — DOXYCYCLINE HYCLATE 100 MG PO CAPS
100.0000 mg | ORAL_CAPSULE | Freq: Two times a day (BID) | ORAL | 0 refills | Status: AC
Start: 2022-11-16 — End: 2022-11-21

## 2022-11-16 MED ORDER — HYDROCOD POLI-CHLORPHE POLI ER 10-8 MG/5ML PO SUER: 5.0000 mL | Freq: Two times a day (BID) | ORAL | 0 refills | Status: AC | PRN

## 2022-11-16 MED ORDER — PREDNISONE 20 MG PO TABS: 40.0000 mg | ORAL_TABLET | Freq: Every day | ORAL | 0 refills | Status: AC

## 2022-11-16 MED ORDER — IPRATROPIUM-ALBUTEROL 0.5-2.5 (3) MG/3ML IN SOLN
3.0000 mL | Freq: Once | RESPIRATORY_TRACT | Status: AC
  Administered 2022-11-16: 3 mL via RESPIRATORY_TRACT
  Filled 2022-11-16: qty 3

## 2022-11-16 NOTE — Discharge Instructions (Addendum)
It was a pleasure caring for you today in the emergency department. ° °Please return to the emergency department for any worsening or worrisome symptoms. ° ° °

## 2022-11-16 NOTE — ED Triage Notes (Signed)
Pt c/o SOB, nasal congestion, body aches, and fever x 2 weeks. Pt saw his PCP last Thursday and had tests ran. He has been calling the doctor's office several days since then but the office tells him the doctor hasn't been to go over his results.

## 2022-11-16 NOTE — ED Notes (Signed)
See triage notes. Mild labored breathing at rest. A/o. Color wnl. Able to complete sentences.

## 2022-11-16 NOTE — ED Provider Notes (Signed)
[place] EMERGENCY DEPARTMENT AT [facility] Provider Note  CSN: [id number] Arrival date & time: 11/16/22 5643  Chief Complaint(s) Shortness of Breath  HPI Aaron Potter is a 62 y.o. male with past medical history as below, significant for obesity, sleep apnea, type II DM, CHF, COPD, hypertension who presents to the ED with complaint of cough, dyspnea, body aches  Symptoms ongoing approximately 2 weeks, subjective fever, rhinorrhea, intermittently productive cough with clear/white sputum, body aches.  Sick contact with COVID-19.  Seen doctors office last week for labs and imaging has not received results yet.  He has been using over-the-counter cough medication without much relief of her symptoms.  No chest pain.  No leg swelling that seems abnormal from baseline.  Feels winded with exertion.  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    Patient Active Problem List   Diagnosis Date Noted   Insulin long-term use (HCC) 08/22/2022   Acute CHF (HCC) 03/31/2021   Chronic combined systolic and diastolic congestive heart failure (HCC) 03/31/2021   Chronic respiratory failure with hypoxia (HCC) 03/01/2021   Diabetes mellitus (HCC) 10/06/2020   DOE (dyspnea on exertion) 05/20/2019   GERD (gastroesophageal reflux disease) 04/19/2019   Dysphagia 04/19/2019   Uncomplicated asthma  03/27/2019   Essential (primary) hypertension    Type 2 diabetes mellitus with hyperglycemia (HCC)    Allergic rhinitis, unspecified    Anxiety disorder, unspecified    Gastro-esophageal reflux disease with esophagitis    Gout, unspecified    Morbid (severe) obesity with alveolar hypoventilation (HCC)    Obesity, unspecified    Polyosteoarthritis, unspecified    Sleep apnea, unspecified    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    Weight gain 11/10/2017   Vitamin D deficiency 11/10/2017   Mixed hyperlipidemia 12/16/2014   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 Authorizing Provider  albuterol (PROVENTIL) (2.5 MG/3ML) 0.083% nebulizer solution Take 3 mLs (2.5 mg total) by nebulization every 6 (six) hours as needed for wheezing or shortness of breath. 11/16/22  Yes Tanda Rockers A, DO  chlorpheniramine-HYDROcodone (TUSSIONEX) 10-8 MG/5ML Take 5 mLs by mouth every 12 (twelve) hours as needed for cough. 11/16/22  Yes Tanda Rockers A, DO  doxycycline (VIBRAMYCIN) 100 MG capsule Take 1 capsule (100 mg total) by mouth 2 (two) times daily for 5  days. 11/16/22 11/21/22 Yes Sloan Leiter, DO  nirmatrelvir/ritonavir (PAXLOVID, 300/100,) 20 x 150 MG & 10 x 100MG  TBPK Take 3 tablets by mouth 2 (two) times daily for 5 days. Patient GFR is >30. Take nirmatrelvir (150 mg) two tablets twice daily for 5 days and ritonavir (100 mg) one tablet twice daily for 5 days. 11/16/22 11/21/22 Yes Sloan Leiter, DO   predniSONE (DELTASONE) 20 MG tablet Take 2 tablets (40 mg total) by mouth daily for 5 days. 11/16/22 11/21/22 Yes Tanda Rockers A, DO  Accu-Chek FastClix Lancets MISC USE TO CHECK BLOOD SUGAR FOUR TIMES A DAY. 06/29/20   Roma Kayser, MD  albuterol (VENTOLIN HFA) 108 (90 Base) MCG/ACT inhaler Inhale 1-2 puffs into the lungs every 6 (six) hours as needed for wheezing or shortness of breath. 08/15/22   Coralyn Helling, MD  allopurinol (ZYLOPRIM) 300 MG tablet Take 300 mg by mouth daily.      [provider]  Blood Glucose Monitoring Suppl (ACCU-CHEK GUIDE) w/Device KIT 1 Piece by Does not apply route as directed. 01/22/19   Roma Kayser, MD  bumetanide (BUMEX) 1 MG tablet Take 1 tablet (1 mg total) by mouth daily. 04/09/21 04/09/22  Sherryll Burger, Pratik D, DO  cetirizine (ZYRTEC) 10 MG tablet Take 10 mg by mouth daily.    [provider]  DULoxetine (CYMBALTA) 20 MG capsule Take 20 mg by mouth daily. 06/16/22   [provider]  gabapentin (NEURONTIN) 300 MG capsule take one tablet (300mg ) in the morning and two tablets at night (600mg ) 10/25/22   Vivi Barrack, DPM  glucose blood (ACCU-CHEK AVIVA PLUS) test strip use to check blood sugar 4 times daily. 10/24/22   Roma Kayser, MD  Insulin Pen Needle (GLOBAL EASE INJECT PEN NEEDLES) 31G X 8 MM MISC use 4 times daily. 05/26/22   Roma Kayser, MD  insulin regular human CONCENTRATED (HUMULIN R U-500 KWIKPEN) 500 UNIT/ML KwikPen INJECT 80-100 UNITS INTO THE SKIN 3 TIMES DAILY WITH MEALS 11/14/22   Nida, Denman Shep, MD  ketoconazole (NIZORAL) 2 % cream Apply 1 Application topically daily. 08/29/22   Vivi Barrack, DPM  metFORMIN (GLUCOPHAGE) 500 MG tablet take 2 tablets by mouth twice daily. 09/23/22   Roma Kayser, MD  Multiple Vitamin (MULTIVITAMIN WITH MINERALS) TABS tablet Take 1 tablet by mouth daily.    [provider]  Oxycodone HCl 10 MG TABS Take 10 mg by mouth 4 (four) times  daily as needed. 07/27/22   [provider]  pantoprazole (PROTONIX) 40 MG tablet TAKE 1 TABLET DAILY, 30 MINUTES BEFORE BREAKFAST ` 04/28/22   Gelene Mink, NP  pravastatin (PRAVACHOL) 40 MG tablet Take 40 mg by mouth daily.    [provider]  sacubitril-valsartan (ENTRESTO) 49-51 MG Take 1 tablet by mouth 2 (two) times daily. 07/13/21   Tolia, Sunit, DO  Semaglutide,0.25 or 0.5MG /DOS, (OZEMPIC, 0.25 OR 0.5 MG/DOSE,) 2 MG/3ML SOPN INJECT 0.25MG  INTO THE SKIN ONCE EACH WEEK 09/14/22   Roma Kayser, MD  silver sulfADIAZINE (SILVADENE) 1 % cream Apply 1 application  topically daily. 06/02/21   [provider]  spironolactone (ALDACTONE) 25 MG tablet take 1 tablet once daily. 10/24/22   Tolia, Sunit, DO  torsemide (DEMADEX) 10 MG tablet take 1 tablet by mouth once daily. 10/24/22   Tolia, Sunit, DO  verapamil (CALAN-SR) 120 MG CR tablet Take 1 tablet (120 mg total) by mouth daily. 04/10/21 06/14/21  Maurilio Lovely D, DO  Vitamin D, Ergocalciferol, 50000 units CAPS take 1 capsule by mouth once a week. 09/23/22   Roma Kayser, MD                                                                                                                                    Past Surgical History Past Surgical History:  Procedure Laterality Date   CHOLECYSTECTOMY     ERCP  1998   [facility], CBD stone s/p cholecystectomy   FOOT SURGERY Right 04/2017   Family History Family History  Problem Relation Age of Onset   Diabetes Mother    Cancer Mother        "male cancer", passed away when patient was 9    Heart attack Father    Diabetes Father    Urticaria Sister    Colon cancer Neg Hx    Colon polyps Neg Hx     Social History Social History   Tobacco Use   Smoking status: Never   Smokeless tobacco: Never  Vaping Use   Vaping status: Never Used  Substance Use Topics   Alcohol use: No    Alcohol/week: 0.0 standard drinks of alcohol   Drug use: No    Allergies Biaxin [clarithromycin], Ace inhibitors, and Farxiga [dapagliflozin]  Review of Systems Review of Systems  Constitutional:  Positive for fatigue and fever. Negative for chills.  HENT:  Positive for congestion, postnasal drip and rhinorrhea.   Respiratory:  Positive for cough and shortness of breath.   Cardiovascular:  Negative for chest pain and palpitations.  Gastrointestinal:  Negative for nausea and vomiting.  Genitourinary:  Negative for dysuria.  Musculoskeletal:  Positive for arthralgias.  Neurological:  Negative for syncope.  All other systems reviewed and are negative.   Physical Exam Vital Signs  I have reviewed the triage vital signs BP (!) 148/75   Pulse 72   Temp 98.2 F (36.8 C) (Oral)   Resp (!) 21   Ht 5\' 10"  (1.778 m)   Wt (!) 199.6 kg   SpO2 94%   BMI 63.13 kg/m  Physical Exam Vitals and nursing note reviewed.  Constitutional:      General: He is not in acute distress.    Appearance: He is well-developed. He is not ill-appearing.  HENT:     Head: Normocephalic and atraumatic.     Right Ear: External ear normal.     Left Ear: External ear normal.     Mouth/Throat:     Mouth: Mucous membranes are moist.  Eyes:     General: No scleral icterus. Cardiovascular:     Rate and Rhythm: Normal rate and regular rhythm.     Pulses: Normal pulses.     Heart sounds: Normal heart sounds.  Pulmonary:     Effort: Pulmonary effort is normal. No respiratory distress.     Breath sounds: Decreased breath sounds present.  Abdominal:  General: Abdomen is flat.     Palpations: Abdomen is soft.     Tenderness: There is no abdominal tenderness.  Musculoskeletal:     Cervical back: No rigidity.     Right lower leg: Edema present.     Left lower leg: Edema present.     Comments: Pedal edema bilateral symmetric  Skin:    General: Skin is warm and dry.     Capillary Refill: Capillary refill takes less than 2 seconds.  Neurological:     Mental Status:  He is alert.  Psychiatric:        Mood and Affect: Mood normal.        Behavior: Behavior normal.     ED Results and Treatments Labs (all labs ordered are listed, but only abnormal results are displayed) Labs Reviewed  BASIC METABOLIC PANEL - Abnormal; Notable for the following components:      Result Value   Glucose, Bld 207 (*)    All other components within normal limits  BRAIN NATRIURETIC PEPTIDE  CBC WITH DIFFERENTIAL/PLATELET                                                                                                                          Radiology DG Chest Port 1 View  Result Date: 11/16/2022 CLINICAL DATA:  Shortness of breath.  Body aches.  Fever. EXAM: PORTABLE CHEST 1 VIEW COMPARISON:  Chest radiograph dated March 31, 2021 FINDINGS: The heart size is enlarged. No discrete focal consolidation, pneumothorax, or significant pleural effusion. The visualized skeletal structures are unremarkable. IMPRESSION: Cardiomegaly.  No acute cardiopulmonary findings. Electronically Signed   By: Hart Robinsons M.D.   On: 11/16/2022 09:34    Pertinent labs & imaging results that were available during my care of the patient were reviewed by me and considered in my medical decision making (see MDM for details).  Medications Ordered in ED Medications  ipratropium-albuterol (DUONEB) 0.5-2.5 (3) MG/3ML nebulizer solution 3 mL (3 mLs Nebulization Given 11/16/22 0917)  chlorpheniramine-HYDROcodone (TUSSIONEX) 10-8 MG/5ML suspension 5 mL (5 mLs Oral Given 11/16/22 0856)                                                                                                                                     Procedures Procedures  (including critical care time)  Medical Decision Making / ED Course    Medical Decision Making:  Shabsi Cuthbert is a 62 y.o. male  with past medical history as below, significant for obesity, sleep apnea, type II DM, CHF, COPD, hypertension who presents to the  ED with complaint of cough, dyspnea, body aches. The complaint involves an extensive differential diagnosis and also carries with it a high risk of complications and morbidity.  Serious etiology was considered. Ddx includes but is not limited to: In my evaluation of this patient's dyspnea my DDx includes, but is not limited to, pneumonia, pulmonary embolism, pneumothorax, pulmonary edema, metabolic acidosis, asthma, COPD, cardiac cause, anemia, anxiety, etc.    Complete initial physical exam performed, notably the patient  was no respiratory distress, HDS.    Reviewed and confirmed nursing documentation for past medical history, family history, social history.  Vital signs reviewed.    Clinical Course as of 11/16/22 1137  Wed Nov 16, 2022  1042 Feeling somewhat better, symptoms likely 2/2 covid 19 [SG]    Clinical Course User Index [SG] Sloan Leiter, DO     Reviewed workup from primary care office, patient positive for COVID-19, labs otherwise look okay.  He also has sick contact with COVID-19.  No hypoxia on initial assessment, get screening labs, chest x-ray, DuoNeb, antitussive  Labs stable, reviewed labs from prior PCP documentation, COVID-19 is positive.  He is feeling much better on recheck, sent home with Paxlovid, antitussive, he has history of recent COPD; will cover possible COPD exacerbation with Doxy and steroids. Does not meet admission criteria for COPD. No hypoxia. Given refill on home nebulizer.  The patient improved significantly and was discharged in stable condition. Detailed discussions were had with the patient regarding current findings, and need for close f/u with PCP or on call doctor. The patient has been instructed to return immediately if the symptoms worsen in any way for re-evaluation. Patient verbalized understanding and is in agreement with current care plan. All questions answered prior to discharge.                   Additional history  obtained: -Additional history obtained from health aid -External records from outside source obtained and reviewed including: Chart review including previous notes, labs, imaging, consultation notes including  PCP documentation, home medications, PDMP   Lab Tests: -I ordered, reviewed, and interpreted labs.   The pertinent results include:   Labs Reviewed  BASIC METABOLIC PANEL - Abnormal; Notable for the following components:      Result Value   Glucose, Bld 207 (*)    All other components within normal limits  BRAIN NATRIURETIC PEPTIDE  CBC WITH DIFFERENTIAL/PLATELET    Notable for labs stable  EKG   EKG Interpretation Date/Time:  Wednesday November 16 2022 09:02:49 EST Ventricular Rate:  65 PR Interval:  187 QRS Duration:  100 QT Interval:  399 QTC Calculation: 415 R Axis:   71  Text Interpretation: Sinus rhythm Low voltage, extremity and precordial leads Consider anterior infarct no stemi Confirmed by Tanda Rockers ([id number]) on 11/16/2022 9:35:19 AM         Imaging Studies ordered: I ordered imaging studies including chest x-ray I independently visualized the following imaging with scope of interpretation limited to determining acute life threatening conditions related to emergency care; findings noted above I independently visualized and interpreted imaging. I agree with the radiologist interpretation   Medicines ordered and prescription drug management: Meds ordered this encounter  Medications   ipratropium-albuterol (DUONEB) 0.5-2.5 (3) MG/3ML nebulizer solution 3 mL   DISCONTD: promethazine-codeine (PHENERGAN  with CODEINE) 6.25-10 MG/5ML syrup 5 mL   chlorpheniramine-HYDROcodone (TUSSIONEX) 10-8 MG/5ML suspension 5 mL   doxycycline (VIBRAMYCIN) 100 MG capsule    Sig: Take 1 capsule (100 mg total) by mouth 2 (two) times daily for 5 days.    Dispense:  10 capsule    Refill:  0   predniSONE (DELTASONE) 20 MG tablet    Sig: Take 2 tablets (40 mg total) by mouth  daily for 5 days.    Dispense:  10 tablet    Refill:  0   nirmatrelvir/ritonavir (PAXLOVID, 300/100,) 20 x 150 MG & 10 x 100MG  TBPK    Sig: Take 3 tablets by mouth 2 (two) times daily for 5 days. Patient GFR is >30. Take nirmatrelvir (150 mg) two tablets twice daily for 5 days and ritonavir (100 mg) one tablet twice daily for 5 days.    Dispense:  30 tablet    Refill:  0   chlorpheniramine-HYDROcodone (TUSSIONEX) 10-8 MG/5ML    Sig: Take 5 mLs by mouth every 12 (twelve) hours as needed for cough.    Dispense:  115 mL    Refill:  0   albuterol (PROVENTIL) (2.5 MG/3ML) 0.083% nebulizer solution    Sig: Take 3 mLs (2.5 mg total) by nebulization every 6 (six) hours as needed for wheezing or shortness of breath.    Dispense:  75 mL    Refill:  12    -I have reviewed the patients home medicines and have made adjustments as needed   Consultations Obtained: na   Cardiac Monitoring: Continuous pulse oximetry interpreted by myself, 97% on RA.    Social Determinants of Health:  Diagnosis or treatment significantly limited by social determinants of health: obesity   Reevaluation: After the interventions noted above, I reevaluated the patient and found that they have improved  Co morbidities that complicate the patient evaluation  Past Medical History:  Diagnosis Date   Allergy    Anxiety disorder, unspecified    Asthma    Back pain    Balanitis    Cellulitis    Essential (primary) hypertension    Gastro-esophageal reflux disease with esophagitis    Gout    Lichen sclerosus    Mixed hyperlipidemia    Morbid (severe) obesity with alveolar hypoventilation (HCC)    Neuropathy    Obesity, morbid (more than 100 lbs over ideal weight or BMI > 40) (HCC)    Polyosteoarthritis, unspecified    Sleep apnea    Type 2 diabetes mellitus with hyperglycemia (HCC)    Urticaria    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    Final Clinical Impression(s) / ED Diagnoses Final diagnoses:  COVID-19  Acute cough        Sloan Leiter, DO 11/16/22 1137

## 2022-11-21 ENCOUNTER — Other Ambulatory Visit: Payer: Self-pay | Admitting: Cardiology

## 2022-11-21 DIAGNOSIS — I5032 Chronic diastolic (congestive) heart failure: Secondary | ICD-10-CM

## 2022-11-29 ENCOUNTER — Ambulatory Visit: Payer: Medicaid Other | Admitting: Podiatry

## 2022-11-29 DIAGNOSIS — B351 Tinea unguium: Secondary | ICD-10-CM

## 2022-11-29 DIAGNOSIS — M79675 Pain in left toe(s): Secondary | ICD-10-CM | POA: Diagnosis not present

## 2022-11-29 DIAGNOSIS — M79674 Pain in right toe(s): Secondary | ICD-10-CM | POA: Diagnosis not present

## 2022-11-29 DIAGNOSIS — E1149 Type 2 diabetes mellitus with other diabetic neurological complication: Secondary | ICD-10-CM | POA: Diagnosis not present

## 2022-11-29 DIAGNOSIS — L84 Corns and callosities: Secondary | ICD-10-CM

## 2022-11-29 MED ORDER — KETOCONAZOLE 2 % EX CREA: 1.0000 | TOPICAL_CREAM | Freq: Every day | CUTANEOUS | 2 refills | Status: DC

## 2022-11-29 MED ORDER — GABAPENTIN 300 MG/6ML PO SOLN: 300.0000 mg | Freq: Three times a day (TID) | ORAL | 2 refills | Status: DC

## 2022-11-29 NOTE — Progress Notes (Signed)
Subjective: Chief Complaint  Patient presents with   Diabetes    RM#11 Follow up on medication prescribed last visit needs refill.    62 y.o. male presents the office with above concerns. He is taking gabapentin 300mg  three times a day but he is asking if it comes in something other than a capsule.  He has been keeping ketoconazole on his feet as well as been helping.  His nails.  There is right big toenail is thick and is loose.  No open lesions.  No drainage.  No other concerns.  He has not yet received diabetic shoes.   Objective: AAO x3, NAD DP/PT pulses palpable bilaterally, CRT less than 3 seconds Sensation decreased to [facility] monofilament.  Notably hypertrophic, dystrophic particular the hallux toenail.  There is no edema, erythema.  Does get tenderness to the nails 1 through 5 on the right and 2 through 5 on the left. Hyperkeratotic tissue noted on the heel without any underlying ulceration, drainage or any signs of infection. Hammertoes present. No pain with calf compression, swelling, warmth, erythema  Assessment: Neuropathy, dressing  Plan: -All treatment options discussed with the patient including all alternatives, risks, complications.  -Will increase the dose of gabapentin.  I have ordered gabapentin suspension. -Ketoconazole for the skin-refilled today -Sharply debrided nails x 9 without any complications or bleeding -Debrided hyperkeratotic lesion x 1 without any complications or bleeding. -Daily foot inspection. -Patient encouraged to call the office with any questions, concerns, change in symptoms.   Vivi Barrack DPM

## 2022-11-29 NOTE — Patient Instructions (Signed)

## 2022-12-04 HISTORY — PX: TOOTH EXTRACTION: SUR596

## 2022-12-21 ENCOUNTER — Other Ambulatory Visit: Payer: Self-pay | Admitting: "Endocrinology

## 2022-12-22 ENCOUNTER — Encounter: Payer: Self-pay | Admitting: "Endocrinology

## 2022-12-22 ENCOUNTER — Ambulatory Visit: Payer: Medicaid Other | Admitting: "Endocrinology

## 2022-12-22 VITALS — BP 134/60 | HR 68 | Ht 69.0 in | Wt >= 6400 oz

## 2022-12-22 DIAGNOSIS — Z794 Long term (current) use of insulin: Secondary | ICD-10-CM

## 2022-12-22 DIAGNOSIS — E1169 Type 2 diabetes mellitus with other specified complication: Secondary | ICD-10-CM

## 2022-12-22 DIAGNOSIS — I10 Essential (primary) hypertension: Secondary | ICD-10-CM | POA: Diagnosis not present

## 2022-12-22 DIAGNOSIS — E782 Mixed hyperlipidemia: Secondary | ICD-10-CM

## 2022-12-22 DIAGNOSIS — Z9189 Other specified personal risk factors, not elsewhere classified: Secondary | ICD-10-CM

## 2022-12-22 MED ORDER — HUMULIN R U-500 KWIKPEN 500 UNIT/ML ~~LOC~~ SOPN: PEN_INJECTOR | SUBCUTANEOUS | 0 refills | Status: DC

## 2022-12-22 NOTE — Progress Notes (Signed)
12/22/2022  Endocrinology follow-up note  Subjective:    Patient ID: Aaron Potter, male    DOB: September 23, 1960, PCP Pcp, No   Past Medical History:  Diagnosis Date   Allergy    Anxiety disorder, unspecified    Asthma    Back pain    Balanitis    Cellulitis    Essential (primary) hypertension    Gastro-esophageal reflux disease with esophagitis    Gout    Lichen sclerosus    Mixed hyperlipidemia    Morbid (severe) obesity with alveolar hypoventilation (HCC)    Neuropathy    Obesity, morbid (more than 100 lbs over ideal weight or BMI > 40) (HCC)    Polyosteoarthritis, unspecified    Sleep apnea    Type 2 diabetes mellitus with hyperglycemia (HCC)    Urticaria    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    Past Surgical History:  Procedure Laterality Date   CHOLECYSTECTOMY     ERCP  1998   [facility], CBD stone s/p cholecystectomy   FOOT SURGERY Right 04/2017   TOOTH EXTRACTION  12/2022   Social History   Socioeconomic History   Marital status: Single    Spouse name: Not on file   Number of children: 0   Years of education: Not on file   Highest education level: Not on file  Occupational History   Not on file  Tobacco Use   Smoking status: Never   Smokeless tobacco: Never  Vaping Use   Vaping status: Never Used  Substance and Sexual Activity   Alcohol use: No    Alcohol/week: 0.0 standard drinks of alcohol   Drug use: No   Sexual activity: Yes  Other Topics Concern   Not on file  Social History Narrative   Not on file   Social Drivers of Health   Financial Resource Strain: Low Risk  (07/26/2021)   Received from [facility], [facility]   Overall Financial Resource Strain (CARDIA)    Difficulty of Paying Living Expenses: Not hard at all  Food Insecurity: No Food Insecurity (07/26/2021)   Received from [facility], [facility]   Hunger Vital Sign    Worried About Running Out of Food in the Last Year: Never true     Ran Out of Food in the Last Year: Never true  Transportation Needs: No Transportation Needs (02/24/2021)   Received from [facility], [facility]   [facility] - Transportation    Lack of Transportation (Medical): No    Lack of Transportation (Non-Medical): No  Physical Activity: Not on file  Stress: Not on file  Social Connections: Not on file   Outpatient Encounter Medications as of 12/22/2022  Medication Sig   Accu-Chek FastClix Lancets MISC USE TO CHECK BLOOD SUGAR FOUR TIMES A DAY.   albuterol (PROVENTIL) (2.5 MG/3ML) 0.083% nebulizer solution Take 3 mLs (2.5 mg total) by nebulization every 6 (six) hours as needed for wheezing or shortness of breath.   albuterol (VENTOLIN HFA) 108 (90 Base) MCG/ACT inhaler Inhale 1-2 puffs into the lungs every 6 (six) hours as needed for wheezing or shortness of breath.   allopurinol (ZYLOPRIM) 300 MG tablet Take 300 mg by mouth daily.     Blood Glucose Monitoring Suppl (ACCU-CHEK GUIDE) w/Device KIT 1 Piece by Does not apply route as directed.   bumetanide (BUMEX) 1 MG tablet Take 1 tablet (1 mg total) by mouth daily.   cetirizine (ZYRTEC) 10  MG tablet Take 10 mg by mouth daily.   chlorpheniramine-HYDROcodone (TUSSIONEX) 10-8 MG/5ML Take 5 mLs by mouth every 12 (twelve) hours as needed for cough.   DULoxetine (CYMBALTA) 20 MG capsule Take 20 mg by mouth daily.   gabapentin (NEURONTIN) 300 MG/6ML solution Take 6 mLs (300 mg total) by mouth 3 (three) times daily.   glucose blood (ACCU-CHEK AVIVA PLUS) test strip use to check blood sugar 4 times daily.   Insulin Pen Needle (GLOBAL EASE INJECT PEN NEEDLES) 31G X 8 MM MISC use 4 times daily.   insulin regular human CONCENTRATED (HUMULIN R U-500 KWIKPEN) 500 UNIT/ML KwikPen INJECT 90-100 UNITS INTO THE SKIN 3 TIMES DAILY WITH MEALS.   ketoconazole (NIZORAL) 2 % cream Apply 1 Application topically daily.   metFORMIN (GLUCOPHAGE) 500 MG tablet take 2 tablets by mouth twice daily.   Multiple Vitamin  (MULTIVITAMIN WITH MINERALS) TABS tablet Take 1 tablet by mouth daily.   Oxycodone HCl 10 MG TABS Take 10 mg by mouth 4 (four) times daily as needed.   pantoprazole (PROTONIX) 40 MG tablet TAKE 1 TABLET DAILY, 30 MINUTES BEFORE BREAKFAST `   pravastatin (PRAVACHOL) 40 MG tablet Take 40 mg by mouth daily.   sacubitril-valsartan (ENTRESTO) 49-51 MG Take 1 tablet by mouth 2 (two) times daily.   silver sulfADIAZINE (SILVADENE) 1 % cream Apply 1 application  topically daily.   spironolactone (ALDACTONE) 25 MG tablet take 1 tablet once daily.   torsemide (DEMADEX) 10 MG tablet take 1 tablet by mouth once daily.   verapamil (CALAN-SR) 120 MG CR tablet Take 1 tablet (120 mg total) by mouth daily.   Vitamin D, Ergocalciferol, 50000 units CAPS take 1 capsule by mouth once a week.   [DISCONTINUED] gabapentin (NEURONTIN) 300 MG capsule take one tablet (300mg ) in the morning and two tablets at night (600mg )   [DISCONTINUED] insulin regular human CONCENTRATED (HUMULIN R U-500 KWIKPEN) 500 UNIT/ML KwikPen INJECT 80-100 UNITS INTO THE SKIN 3 TIMES DAILY WITH MEALS   [DISCONTINUED] Semaglutide,0.25 or 0.5MG /DOS, (OZEMPIC, 0.25 OR 0.5 MG/DOSE,) 2 MG/3ML SOPN INJECT 0.25MG  INTO THE SKIN ONCE EACH WEEK (Patient not taking: Reported on 12/22/2022)   No facility-administered encounter medications on file as of 12/22/2022.   ALLERGIES: Allergies  Allergen Reactions   Biaxin [Clarithromycin] Anaphylaxis and Shortness Of Breath   Ace Inhibitors     D/c ACEi 05/20/2019 > changed to clonidine for pain control component   Although even in retrospect it may not be clear the ACEi contributed to the pt's symptoms, adding them back at this point or in the future would risk confusion in interpretation of non-specific respiratory symptoms to which this patient is prone ie Better not to muddy the waters here.     Marcelline Deist [Dapagliflozin] Other (See Comments)   VACCINATION STATUS: Immunization History  Administered Date(s)  Administered   Influenza Inj Mdck Quad Pf 10/31/2019, 11/12/2020   Influenza-Unspecified 10/18/2010, 10/18/2011, 10/18/2012, 12/11/2013, 09/11/2014, 10/13/2015, 09/06/2018   Moderna Sars-Covid-2 Vaccination 04/01/2019, 04/29/2019   PNEUMOCOCCAL CONJUGATE-20 05/08/2020   Tdap 04/13/2016    Diabetes He presents for his follow-up diabetic visit. He has type 2 diabetes mellitus. Onset time: He was diagnosed at approximate age of 66 years. His disease course has been improving. There are no hypoglycemic associated symptoms. Pertinent negatives for hypoglycemia include no confusion, headaches, pallor or seizures. Associated symptoms include polydipsia and polyuria. Pertinent negatives for diabetes include no chest pain, no fatigue, no polyphagia and no weakness. There are no hypoglycemic complications. Symptoms  are stable. Diabetic complications include PVD. Risk factors for coronary artery disease include dyslipidemia, diabetes mellitus, family history, male sex, obesity and sedentary lifestyle. Current diabetic treatment includes intensive insulin program. He is compliant with treatment most of the time. His weight is fluctuating minimally. He is following a generally unhealthy diet. When asked about meal planning, he reported none. He has had a previous visit with a dietitian. He never participates in exercise. His home blood glucose trend is increasing steadily. His breakfast blood glucose range is generally 140-180 mg/dl. His lunch blood glucose range is generally 140-180 mg/dl. His dinner blood glucose range is generally 140-180 mg/dl. His bedtime blood glucose range is generally 140-180 mg/dl. His overall blood glucose range is 140-180 mg/dl. Coe presents with his meter and logs.  He presents with increasing glycemic profile and previsit labs showing A1c of 7.7%.    He did not document any hypoglycemia.  He did not tolerate the Ozempic tried during his last visit. ) An ACE inhibitor/angiotensin II  receptor blocker is being taken.  Hyperlipidemia This is a chronic problem. The current episode started more than 1 year ago. The problem is uncontrolled. Exacerbating diseases include diabetes and obesity. Associated symptoms include shortness of breath. Pertinent negatives include no chest pain or myalgias. Current antihyperlipidemic treatment includes statins. Risk factors for coronary artery disease include diabetes mellitus, dyslipidemia, hypertension, male sex, obesity and a sedentary lifestyle.  Hypertension This is a chronic problem. The current episode started more than 1 year ago. The problem is uncontrolled. Associated symptoms include shortness of breath. Pertinent negatives include no chest pain, headaches, neck pain or palpitations. Risk factors for coronary artery disease include diabetes mellitus, dyslipidemia, obesity, male gender, sedentary lifestyle and family history. Past treatments include ACE inhibitors and calcium channel blockers. The current treatment provides no improvement. Compliance problems include diet.  Hypertensive end-organ damage includes PVD.     Review of systems  Limited as above.   Objective:    BP 134/60   Pulse 68   Ht 5\' 9"  (1.753 m)   Wt (!) 452 lb 3.2 oz (205.1 kg)   BMI 66.78 kg/m   Wt Readings from Last 3 Encounters:  12/22/22 (!) 452 lb 3.2 oz (205.1 kg)  11/16/22 (!) 440 lb (199.6 kg)  08/22/22 (!) 453 lb 3.2 oz (205.6 kg)     Physical Exam- Limited  Edematous lower extremities. Calluses, poor circulation on bilateral lower extremities.  Diminished monofilament test. Results for orders placed or performed during the hospital encounter of 11/16/22  Basic metabolic panel   Collection Time: 11/16/22  9:31 AM  Result Value Ref Range   Sodium 138 135 - 145 mmol/L   Potassium 4.4 3.5 - 5.1 mmol/L   Chloride 104 98 - 111 mmol/L   CO2 23 22 - 32 mmol/L   Glucose, Bld 207 (H) 70 - 99 mg/dL   BUN 10 6 - 20 mg/dL   Creatinine, Ser 1.19  0.61 - 1.24 mg/dL   Calcium 9.1 8.9 - 14.7 mg/dL   GFR, Estimated >82 >95 mL/min   Anion gap 11 5 - 15  Brain natriuretic peptide   Collection Time: 11/16/22  9:31 AM  Result Value Ref Range   B Natriuretic Peptide 73.0 0.0 - 100.0 pg/mL  CBC with Differential   Collection Time: 11/16/22  9:31 AM  Result Value Ref Range   WBC 6.1 4.0 - 10.5 K/uL   RBC 4.29 4.22 - 5.81 MIL/uL   Hemoglobin 13.4 13.0 -  17.0 g/dL   HCT 16.1 09.6 - 04.5 %   MCV 96.7 80.0 - 100.0 fL   MCH 31.2 26.0 - 34.0 pg   MCHC 32.3 30.0 - 36.0 g/dL   RDW 40.9 81.1 - 91.4 %   Platelets 305 150 - 400 K/uL   nRBC 0.0 0.0 - 0.2 %   Neutrophils Relative % 64 %   Neutro Abs 3.9 1.7 - 7.7 K/uL   Lymphocytes Relative 26 %   Lymphs Abs 1.6 0.7 - 4.0 K/uL   Monocytes Relative 7 %   Monocytes Absolute 0.4 0.1 - 1.0 K/uL   Eosinophils Relative 2 %   Eosinophils Absolute 0.1 0.0 - 0.5 K/uL   Basophils Relative 0 %   Basophils Absolute 0.0 0.0 - 0.1 K/uL   Immature Granulocytes 1 %   Abs Immature Granulocytes 0.03 0.00 - 0.07 K/uL   Diabetic Labs (most recent): Lab Results  Component Value Date   HGBA1C 7.0 08/22/2022   HGBA1C 7.2 (A) 04/20/2022   HGBA1C 7.3 (A) 01/19/2022   MICROALBUR 0.8 06/11/2018   MICROALBUR 5.3 10/15/2016   MICROALBUR 1.4 03/12/2015   Lipid Panel     Component Value Date/Time   CHOL 134 08/15/2022 0838   TRIG 108 08/15/2022 0838   HDL 43 08/15/2022 0838   CHOLHDL 3.1 08/15/2022 0838   CHOLHDL 3.8 04/01/2019 0843   VLDL 31 (H) 03/12/2015 1200   LDLCALC 71 08/15/2022 0838   LDLCALC 79 04/01/2019 0843   LDLDIRECT 90 08/15/2022 0832      Latest Ref Rng & Units 11/16/2022    9:31 AM 08/15/2022    8:38 AM 08/15/2022    8:32 AM  CMP  Glucose 70 - 99 mg/dL 782  956  96   BUN 6 - 20 mg/dL 10  11  15    Creatinine 0.61 - 1.24 mg/dL 2.13  0.86  5.78   Sodium 135 - 145 mmol/L 138  140  142   Potassium 3.5 - 5.1 mmol/L 4.4  4.8  3.9   Chloride 98 - 111 mmol/L 104  99  102   CO2 22 - 32  mmol/L 23  26  22    Calcium 8.9 - 10.3 mg/dL 9.1  46.9  9.0   Total Protein 6.0 - 8.5 g/dL  6.5  6.7   Total Bilirubin 0.0 - 1.2 mg/dL  0.5  0.5   Alkaline Phos 44 - 121 IU/L  73  79   AST 0 - 40 IU/L  28  25   ALT 0 - 44 IU/L  24  21      Assessment & Plan:   1. Uncontrolled type 2 diabetes mellitus with diabetic peripheral angiopathy without gangrene, with long-term current use of insulin (HCC)  Lasaro presents with his meter and logs.  He presents with increasing glycemic profile and previsit labs showing A1c of 7.7%.    He did not document any hypoglycemia.  He did not tolerate the Ozempic tried during his last visit.  He remains at extremely high risk for more acute and chronic complications of diabetes which include CAD, CVA, CKD, retinopathy, and neuropathy. These are all discussed in detail with the patient.   Glucose logs and insulin administration records pertaining to this visit,  to be scanned into patient's records.  Recent labs reviewed.  - he acknowledges that there is a room for improvement in his food and drink choices. - Suggestion is made for him to avoid simple carbohydrates  from  his diet including Cakes, Sweet Desserts, Ice Cream, Soda (diet and regular), Sweet Tea, Candies, Chips, Cookies, Store Bought Juices, Alcohol , Artificial Sweeteners,  Coffee Creamer, and "Sugar-free" Products, Lemonade. This will help patient to have more stable blood glucose profile and potentially avoid unintended weight gain.  The following Lifestyle Medicine recommendations according to American College of Lifestyle Medicine  [facility]) were discussed and and offered to patient and he  agrees to start the journey:  A. Whole Foods, Plant-Based Nutrition comprising of fruits and vegetables, plant-based proteins, whole-grain carbohydrates was discussed in detail with the patient.   A list for source of those nutrients were also provided to the patient.  Patient will use only water or unsweetened  tea for hydration. B.  The need to stay away from risky substances including alcohol, smoking; obtaining 7 to 9 hours of restorative sleep, at least 150 minutes of moderate intensity exercise weekly, the importance of healthy social connections,  and stress management techniques were discussed. C.  A full color page of  Calorie density of various food groups per pound showing examples of each food groups was provided to the patient.  - Patient is advised to stick to a routine mealtimes to eat 3 meals  a day and avoid unnecessary snacks ( to snack only to correct hypoglycemia).   -He did not make major changes in the recommended Plant predominant Whole Foods lifestyle nutrition. -He did not tolerate the repeat attempt in Ozempic which he stopped after a month.    -For now, he is advised to continue his Humulin U500  to 100 units with breakfast, 100 units with lunch, 90 units with supper  for pre-meal blood glucose readings of 90 or above milligrams per deciliter associated with strict monitoring of blood glucose 4 times a day-before meals and at bedtime.    -Patient is encouraged to call clinic for blood glucose levels less than 70 or above 300 mg /dl. -He is advised to continue Metformin 1000 mg p.o. twice daily-with breakfast and supper.    -He did not tolerate Marcelline Deist, which he has stopped  taking already.    -He is following with Norm Salt, CDE for DM education.  - Patient specific target  for A1c; LDL, HDL, Triglycerides,  were discussed in detail.  2) BP/HTN: -His blood pressure is controlled to target.  Patient admits he has not taken his blood pressure medications this morning.    He is advised to proceed with his benazepril to 10 mg p.o. daily, also has verapamil 240 mg p.o. daily for blood pressure treatment.   3) Lipids/HPL: He has uncontrolled LDL, however improving LDL at 79 from 99 .  He is advised to continue lovastatin 40 mg p.o. nightly.     Side effects and precautions  discussed with him.     4)  Weight/Diet: His BMI is 66.93-clearly complicating his diabetes care.  He is a candidate for major weight loss.   He has had no significant success in weight control.  He still admits to dietary indiscretion.   CDE consult in progress, exercise, and carbohydrates information provided.  He hesitates to consider bariatric surgery. He has gotten a pair of diabetes shoes.   5) Chronic Care/Health Maintenance:  -Patient  is  on ACEI/ARB and Statin medications and encouraged to continue to follow up with Ophthalmology, Podiatrist at least yearly or according to recommendations, and advised to  stay away from smoking. I have recommended yearly flu vaccine and pneumonia vaccination  at least every 5 years; and  sleep for at least 7 hours a day. -This patient cannot exercise optimally due to his heavy weight and comorbidities.  - I advised patient to maintain close follow up with Pcp, No for primary care needs.   I spent 41  minutes in the care of the patient today including review of labs from CMP, Lipids, Thyroid Function, Hematology (current and previous including abstractions from other facilities); face-to-face time discussing  his blood glucose readings/logs, discussing hypoglycemia and hyperglycemia episodes and symptoms, medications doses, his options of short and long term treatment based on the latest standards of care / guidelines;  discussion about incorporating lifestyle medicine;  and documenting the encounter. Risk reduction counseling performed per USPSTF guidelines to reduce  obesity and cardiovascular risk factors.     Please refer to Patient Instructions for Blood Glucose Monitoring and Insulin/Medications Dosing Guide"  in media tab for additional information. Please  also refer to " Patient Self Inventory" in the Media  tab for reviewed elements of pertinent patient history.  Aaron Potter participated in the discussions, expressed understanding, and voiced  agreement with the above plans.  All questions were answered to his satisfaction. he is encouraged to contact clinic should he have any questions or concerns prior to his return visit.   Follow up plan: -Return in about 4 months (around 04/22/2023) for Bring Meter/CGM Device/Logs- A1c in Office.  Marquis Lunch, MD Phone: [phone]  Fax: [phone]  -  This note was partially dictated with voice recognition software. Similar sounding words can be transcribed inadequately or may not  be corrected upon review.  12/22/2022, 4:20 PM

## 2022-12-22 NOTE — Patient Instructions (Signed)

## 2023-01-24 ENCOUNTER — Other Ambulatory Visit: Payer: Self-pay | Admitting: "Endocrinology

## 2023-01-26 ENCOUNTER — Ambulatory Visit: Payer: Medicaid Other | Admitting: Cardiology

## 2023-01-26 NOTE — Progress Notes (Deleted)
Cardiology Office Note:  .   Date:  01/26/2023  ID:  Aaron Potter, DOB 05/03/1960, MRN [id number] PCP:  Pcp, No  Former Cardiology Providers: Dr. Rayetta Pigg Assar [facility] [place])  [facility] Providers Cardiologist:  Tessa Lerner, DO , [facility] (established care August 12, 2020) Electrophysiologist:  None  Click to update primary MD,subspecialty MD or APP then REFRESH:1}    No chief complaint on file.   History of Present Illness: Aaron Potter is a 63 y.o. Caucasian male whose past medical history and cardiovascular risk factors includes: HFpEF, hypertension, Hypertension, hyperlipidemia, insulin-dependent diabetes mellitus type 2, OSA on BiPAP, family history of heart disease, morbid obesity, ***.  Patient being followed by the practice given his history of HFpEF.    ***  Review of Systems: .   ROS  Studies Reviewed:   EKG: ***  Echocardiogram: 05/01/20 (Care Everywhere OV 08/04/2020) 1. Technically difficult study. 2. The left ventricle is not well visualized but probably normal in size with mildly increased wall thickness. 3. Overall left ventricular systolic function appears normal. 4. The left atrium is moderately dilated in size. 5. The right ventricle is mildly dilated in size. 6. The right atrium is mildly dilated in size. 7. Gradient and calculations are suggestive of some degree of aortic stenosis (possibly moderate).   04/05/2021: Technically difficult study despite Definity given the body habitus, probably normal LVEF 55-60% indeterminate diastolic filling pattern.  RADIOLOGY: NA  Risk Assessment/Calculations:   N/A   Labs:       Latest Ref Rng & Units 11/16/2022    9:31 AM 04/01/2021    5:24 AM 03/31/2021   11:20 AM  CBC  WBC 4.0 - 10.5 K/uL 6.1  9.1  8.0   Hemoglobin 13.0 - 17.0 g/dL 02.7  25.3  66.4   Hematocrit 39.0 - 52.0 % 41.5  44.9  47.0   Platelets 150 - 400 K/uL 305  316  303        Latest Ref Rng & Units 11/16/2022    9:31 AM  08/15/2022    8:38 AM 08/15/2022    8:32 AM  BMP  Glucose 70 - 99 mg/dL 403  474  96   BUN 6 - 20 mg/dL 10  11  15    Creatinine 0.61 - 1.24 mg/dL 2.59  5.63  8.75   BUN/Creat Ratio 9 - 20  12  18    Sodium 135 - 145 mmol/L 138  140  142   Potassium 3.5 - 5.1 mmol/L 4.4  4.8  3.9   Chloride 98 - 111 mmol/L 104  99  102   CO2 22 - 32 mmol/L 23  26  22    Calcium 8.9 - 10.3 mg/dL 9.1  64.3  9.0       Latest Ref Rng & Units 11/16/2022    9:31 AM 08/15/2022    8:38 AM 08/15/2022    8:32 AM  CMP  Glucose 70 - 99 mg/dL 329  518  96   BUN 6 - 20 mg/dL 10  11  15    Creatinine 0.61 - 1.24 mg/dL 8.41  6.60  6.30   Sodium 135 - 145 mmol/L 138  140  142   Potassium 3.5 - 5.1 mmol/L 4.4  4.8  3.9   Chloride 98 - 111 mmol/L 104  99  102   CO2 22 - 32 mmol/L 23  26  22    Calcium 8.9 - 10.3 mg/dL 9.1  16.0  9.0   Total Protein 6.0 - 8.5 g/dL  6.5  6.7   Total Bilirubin 0.0 - 1.2 mg/dL  0.5  0.5   Alkaline Phos 44 - 121 IU/L  73  79   AST 0 - 40 IU/L  28  25   ALT 0 - 44 IU/L  24  21     Lab Results  Component Value Date   CHOL 134 08/15/2022   HDL 43 08/15/2022   LDLCALC 71 08/15/2022   LDLDIRECT 90 08/15/2022   TRIG 108 08/15/2022   CHOLHDL 3.1 08/15/2022   No results for input(s): "LIPOA" in the last 8760 hours. No components found for: "NTPROBNP" Recent Labs    08/15/22 0832  PROBNP 58   Recent Labs    08/15/22 0838  TSH 3.520    ***  Physical Exam:   There were no vitals filed for this visit. There is no height or weight on file to calculate BMI. Wt Readings from Last 3 Encounters:  12/22/22 (!) 452 lb 3.2 oz (205.1 kg)  11/16/22 (!) 440 lb (199.6 kg)  08/22/22 (!) 453 lb 3.2 oz (205.6 kg)    Physical Exam   Impression & Recommendation(s):  Impression:   ICD-10-CM   1. Chronic heart failure with preserved ejection fraction (HFpEF) (HCC)  I50.32     2. Benign hypertension  I10     3. Type 2 diabetes mellitus without complication, with long-term current use of  insulin (HCC)  E11.9    Z79.4     4. Mixed hyperlipidemia  E78.2     5. OSA treated with BiPAP  G47.33     6. Family history of heart disease  Z82.49        Recommendation(s):  Chronic heart failure with preserved ejection fraction (HFpEF) (HCC) Stage C, NYHA class II. Last hospitalization: April 2023, diuresed 22 pounds, discharge weight was 446 pounds ***  Farxiga discontinued in the past due to yeast infection. Spironolactone was held in the past secondary to hyperkalemia (may not be a true side effect as he was also on potassium supplements at that time).  Benign hypertension ***  Type 2 diabetes mellitus without complication, with long-term current use of insulin (HCC) ***  Mixed hyperlipidemia Currently on *** p.o. daily.   He denies myalgia or other side effects. Most recent lipids dated ***, independently reviewed as noted above.  LDL is ***.  Cardiology is following peripherally.  OSA treated with BiPAP Reemphasized the importance of BiPAP compliance ***  Obesity due to excess calories There is no height or weight on file to calculate BMI. I reviewed with him importance of diet, regular physical activity/exercise, weight loss.   Patient states that he was on Ozempic in the past with the medication was discontinued due to nausea and vomiting.  Have advised him to reach back to his provider and retrial Ozempic and continuing a dose at which he felt well and not nauseated. Patient is educated on the importance of increasing physical activity gradually as tolerated with a goal of moderate intensity exercise for 30 minutes a day 5 days a week. ***   Orders Placed:  No orders of the defined types were placed in this encounter.   As part of medical decision making ***  Final Medication List:   No orders of the defined types were placed in this encounter.   There are no discontinued medications.   Current Outpatient Medications:    Accu-Chek FastClix Lancets  MISC, USE  TO CHECK BLOOD SUGAR FOUR TIMES A DAY., Disp: 400 each, Rfl: 0   albuterol (PROVENTIL) (2.5 MG/3ML) 0.083% nebulizer solution, Take 3 mLs (2.5 mg total) by nebulization every 6 (six) hours as needed for wheezing or shortness of breath., Disp: 75 mL, Rfl: 12   albuterol (VENTOLIN HFA) 108 (90 Base) MCG/ACT inhaler, Inhale 1-2 puffs into the lungs every 6 (six) hours as needed for wheezing or shortness of breath., Disp: 8 g, Rfl: 5   allopurinol (ZYLOPRIM) 300 MG tablet, Take 300 mg by mouth daily.  , Disp: , Rfl:    Blood Glucose Monitoring Suppl (ACCU-CHEK GUIDE) w/Device KIT, 1 Piece by Does not apply route as directed., Disp: 1 kit, Rfl: 0   bumetanide (BUMEX) 1 MG tablet, Take 1 tablet (1 mg total) by mouth daily., Disp: 30 tablet, Rfl: 11   cetirizine (ZYRTEC) 10 MG tablet, Take 10 mg by mouth daily., Disp: , Rfl:    chlorpheniramine-HYDROcodone (TUSSIONEX) 10-8 MG/5ML, Take 5 mLs by mouth every 12 (twelve) hours as needed for cough., Disp: 115 mL, Rfl: 0   DULoxetine (CYMBALTA) 20 MG capsule, Take 20 mg by mouth daily., Disp: , Rfl:    gabapentin (NEURONTIN) 300 MG/6ML solution, Take 6 mLs (300 mg total) by mouth 3 (three) times daily., Disp: 470 mL, Rfl: 2   glucose blood (ACCU-CHEK AVIVA PLUS) test strip, use to check blood sugar 4 times daily., Disp: 200 strip, Rfl: 3   Insulin Pen Needle (GLOBAL EASE INJECT PEN NEEDLES) 31G X 8 MM MISC, use 4 times daily., Disp: 300 each, Rfl: 0   insulin regular human CONCENTRATED (HUMULIN R U-500 KWIKPEN) 500 UNIT/ML KwikPen, INJECT 90-100 UNITS INTO THE SKIN 3 TIMES DAILY WITH MEALS., Disp: 51 mL, Rfl: 0   ketoconazole (NIZORAL) 2 % cream, Apply 1 Application topically daily., Disp: 60 g, Rfl: 2   metFORMIN (GLUCOPHAGE) 500 MG tablet, take 2 tablets by mouth twice daily., Disp: 360 tablet, Rfl: 0   Multiple Vitamin (MULTIVITAMIN WITH MINERALS) TABS tablet, Take 1 tablet by mouth daily., Disp: , Rfl:    Oxycodone HCl 10 MG TABS, Take 10 mg by  mouth 4 (four) times daily as needed., Disp: , Rfl:    pantoprazole (PROTONIX) 40 MG tablet, TAKE 1 TABLET DAILY, 30 MINUTES BEFORE BREAKFAST `, Disp: 90 tablet, Rfl: 3   pravastatin (PRAVACHOL) 40 MG tablet, Take 40 mg by mouth daily., Disp: , Rfl:    sacubitril-valsartan (ENTRESTO) 49-51 MG, Take 1 tablet by mouth 2 (two) times daily., Disp: 180 tablet, Rfl: 0   silver sulfADIAZINE (SILVADENE) 1 % cream, Apply 1 application  topically daily., Disp: , Rfl:    spironolactone (ALDACTONE) 25 MG tablet, take 1 tablet once daily., Disp: 90 tablet, Rfl: 1   torsemide (DEMADEX) 10 MG tablet, take 1 tablet by mouth once daily., Disp: 90 tablet, Rfl: 1   verapamil (CALAN-SR) 120 MG CR tablet, Take 1 tablet (120 mg total) by mouth daily., Disp: 30 tablet, Rfl: 0   Vitamin D, Ergocalciferol, 50000 units CAPS, take 1 capsule by mouth once a week., Disp: 12 capsule, Rfl: 0  Consent:   ***  Disposition:   *** Patient may be asked to follow-up sooner based on the results of the above-mentioned testing.  His questions and concerns were addressed to his satisfaction. He voices understanding of the recommendations provided during this encounter.    Signed, Tessa Lerner, DO, [facility]  [facility]  [address] [place], [place]  [address] 01/26/2023 8:41 AM

## 2023-02-09 ENCOUNTER — Other Ambulatory Visit: Payer: Self-pay | Admitting: "Endocrinology

## 2023-02-09 DIAGNOSIS — Z9189 Other specified personal risk factors, not elsewhere classified: Secondary | ICD-10-CM

## 2023-02-17 ENCOUNTER — Other Ambulatory Visit: Payer: Self-pay | Admitting: Podiatry

## 2023-03-01 ENCOUNTER — Other Ambulatory Visit: Payer: Self-pay | Admitting: "Endocrinology

## 2023-03-01 DIAGNOSIS — Z9189 Other specified personal risk factors, not elsewhere classified: Secondary | ICD-10-CM

## 2023-03-02 ENCOUNTER — Encounter: Payer: Self-pay | Admitting: Podiatry

## 2023-03-02 ENCOUNTER — Ambulatory Visit: Payer: Medicaid Other | Admitting: Podiatry

## 2023-03-02 DIAGNOSIS — M2042 Other hammer toe(s) (acquired), left foot: Secondary | ICD-10-CM | POA: Diagnosis not present

## 2023-03-02 DIAGNOSIS — M2041 Other hammer toe(s) (acquired), right foot: Secondary | ICD-10-CM | POA: Diagnosis not present

## 2023-03-02 MED ORDER — KETOCONAZOLE 2 % EX CREA: 1.0000 | TOPICAL_CREAM | Freq: Every day | CUTANEOUS | 0 refills | Status: DC

## 2023-03-02 NOTE — Patient Instructions (Signed)

## 2023-03-02 NOTE — Progress Notes (Unsigned)
 Subjective: Chief Complaint  Patient presents with   Diabetes    RM#11 Follow up on medication prescribed last visit needs refill.    63 y.o. male presents the office with above concerns. He is taking gabapentin 300mg  three times a day but he is asking if it comes in something other than a capsule.  He has been keeping ketoconazole on his feet as well as been helping.  His nails.  There is right big toenail is thick and is loose.  No open lesions.  No drainage.  No other concerns.  He has not yet received diabetic shoes.   Objective: AAO x3, NAD DP/PT pulses palpable bilaterally, CRT less than 3 seconds Sensation decreased to [facility] monofilament.  Notably hypertrophic, dystrophic particular the hallux toenail.  There is no edema, erythema.  Does get tenderness to the nails 1 through 5 on the right and 2 through 5 on the left. Hyperkeratotic tissue noted on the heel without any underlying ulceration, drainage or any signs of infection. Hammertoes present. No pain with calf compression, swelling, warmth, erythema  Assessment: Neuropathy, dressing  Plan: -All treatment options discussed with the patient including all alternatives, risks, complications.  -Will increase the dose of gabapentin.  I have ordered gabapentin suspension. -Ketoconazole for the skin-refilled today -Sharply debrided nails x 9 without any complications or bleeding -Debrided hyperkeratotic lesion x 1 without any complications or bleeding. -Daily foot inspection. -Patient encouraged to call the office with any questions, concerns, change in symptoms.   Vivi Barrack DPM

## 2023-03-03 ENCOUNTER — Other Ambulatory Visit: Payer: Self-pay | Admitting: "Endocrinology

## 2023-03-14 ENCOUNTER — Ambulatory Visit: Payer: Medicaid Other | Admitting: Cardiology

## 2023-03-20 ENCOUNTER — Other Ambulatory Visit: Payer: Self-pay | Admitting: Podiatry

## 2023-03-20 ENCOUNTER — Other Ambulatory Visit: Payer: Self-pay | Admitting: "Endocrinology

## 2023-03-24 ENCOUNTER — Other Ambulatory Visit: Payer: Self-pay | Admitting: Gastroenterology

## 2023-04-25 ENCOUNTER — Ambulatory Visit: Payer: Medicaid Other | Admitting: "Endocrinology

## 2023-05-02 ENCOUNTER — Other Ambulatory Visit: Payer: Self-pay | Admitting: "Endocrinology

## 2023-05-02 ENCOUNTER — Other Ambulatory Visit: Payer: Self-pay | Admitting: Podiatry

## 2023-05-17 ENCOUNTER — Encounter: Payer: Self-pay | Admitting: "Endocrinology

## 2023-05-17 ENCOUNTER — Ambulatory Visit: Admitting: "Endocrinology

## 2023-05-17 VITALS — BP 126/54 | HR 68 | Ht 69.0 in | Wt >= 6400 oz

## 2023-05-17 DIAGNOSIS — Z794 Long term (current) use of insulin: Secondary | ICD-10-CM | POA: Diagnosis not present

## 2023-05-17 DIAGNOSIS — I10 Essential (primary) hypertension: Secondary | ICD-10-CM

## 2023-05-17 DIAGNOSIS — E782 Mixed hyperlipidemia: Secondary | ICD-10-CM

## 2023-05-17 DIAGNOSIS — Z9189 Other specified personal risk factors, not elsewhere classified: Secondary | ICD-10-CM

## 2023-05-17 DIAGNOSIS — E1169 Type 2 diabetes mellitus with other specified complication: Secondary | ICD-10-CM

## 2023-05-17 LAB — POCT GLYCOSYLATED HEMOGLOBIN (HGB A1C): HbA1c, POC (controlled diabetic range): 8 % — AB (ref 0.0–7.0)

## 2023-05-17 MED ORDER — HUMULIN R U-500 KWIKPEN 500 UNIT/ML ~~LOC~~ SOPN: PEN_INJECTOR | SUBCUTANEOUS | 1 refills | Status: DC

## 2023-05-17 MED ORDER — RYBELSUS 3 MG PO TABS
3.0000 mg | ORAL_TABLET | Freq: Every day | ORAL | 1 refills | Status: DC
Start: 2023-05-17 — End: 2023-09-19

## 2023-05-17 NOTE — Patient Instructions (Signed)

## 2023-05-17 NOTE — Progress Notes (Signed)
 05/17/2023  Endocrinology follow-up note  Subjective:    Patient ID: Aaron Potter, male    DOB: May 19, 1960, PCP Pcp, No   Past Medical History:  Diagnosis Date   Allergy    Anxiety disorder, unspecified    Asthma    Back pain    Balanitis    Cellulitis    Essential (primary) hypertension    Gastro-esophageal reflux disease with esophagitis    Gout    Lichen sclerosus    Mixed hyperlipidemia    Morbid (severe) obesity with alveolar hypoventilation (HCC)    Neuropathy    Obesity, morbid (more than 100 lbs over ideal weight or BMI > 40) (HCC)    Polyosteoarthritis, unspecified    Sleep apnea    Type 2 diabetes mellitus with hyperglycemia (HCC)    Urticaria    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    Past Surgical History:  Procedure Laterality Date   CHOLECYSTECTOMY     ERCP  1998   [facility], CBD stone s/p cholecystectomy   FOOT SURGERY Right 04/2017   TOOTH EXTRACTION  12/2022   Social History   Socioeconomic History   Marital status: Single    Spouse name: Not on file   Number of children: 0   Years of education: Not on file   Highest education level: Not on file  Occupational History   Not on file  Tobacco Use   Smoking status: Never   Smokeless tobacco: Never  Vaping Use   Vaping status: Never Used  Substance and Sexual Activity   Alcohol use: No    Alcohol/week: 0.0 standard drinks of alcohol   Drug use: No   Sexual activity: Yes  Other Topics Concern   Not on file  Social History Narrative   Not on file   Social Drivers of Health   Financial Resource Strain: Low Risk  (07/26/2021)   Received from [name], [facility]   Overall Financial Resource Strain (CARDIA)    Difficulty of Paying Living Expenses: Not hard at all  Food Insecurity: No Food Insecurity (01/17/2023)   Received from [facility]   Hunger Vital Sign    Worried About Running Out of Food in the Last Year: Never true    Ran Out of Food  in the Last Year: Never true  Transportation Needs: No Transportation Needs (01/17/2023)   Received from [facility]   PRAPARE - Transportation    Lack of Transportation (Medical): No    Lack of Transportation (Non-Medical): No  Physical Activity: Insufficiently Active (01/17/2023)   Received from [facility]   Exercise Vital Sign    Days of Exercise per Week: 3 days    Minutes of Exercise per Session: 10 min  Stress: No Stress Concern Present (01/17/2023)   Received from [facility] - Occupational Stress Questionnaire    Feeling of Stress : Only a little  Social Connections: Moderately Isolated (01/17/2023)   Received from [facility]   Social Connection and Isolation Panel [NHANES]    Frequency of Communication with Friends and Family: More than three times a week    Frequency of Social Gatherings with Friends and Family: More than three times a week    Attends Religious Services: 1 to 4 times per year    Active Member of [employer] or Organizations: No    Attends Banker Meetings: Never  Marital Status: Never married   Outpatient Encounter Medications as of 05/17/2023  Medication Sig   Semaglutide  (RYBELSUS ) 3 MG TABS Take 1 tablet (3 mg total) by mouth daily.   Accu-Chek FastClix Lancets MISC USE TO CHECK BLOOD SUGAR FOUR TIMES A DAY.   albuterol  (PROVENTIL ) (2.5 MG/3ML) 0.083% nebulizer solution Take 3 mLs (2.5 mg total) by nebulization every 6 (six) hours as needed for wheezing or shortness of breath.   albuterol  (VENTOLIN  HFA) 108 (90 Base) MCG/ACT inhaler Inhale 1-2 puffs into the lungs every 6 (six) hours as needed for wheezing or shortness of breath.   allopurinol  (ZYLOPRIM ) 300 MG tablet Take 300 mg by mouth daily.     Blood Glucose Monitoring Suppl (ACCU-CHEK GUIDE) w/Device KIT 1 Piece by Does not apply route as directed.   bumetanide  (BUMEX ) 1 MG tablet Take 1 tablet (1 mg total) by mouth daily.    cetirizine (ZYRTEC) 10 MG tablet Take 10 mg by mouth daily.   chlorpheniramine-HYDROcodone  (TUSSIONEX) 10-8 MG/5ML Take 5 mLs by mouth every 12 (twelve) hours as needed for cough.   DULoxetine (CYMBALTA) 20 MG capsule Take 20 mg by mouth daily.   gabapentin  (NEURONTIN ) 250 MG/5ML solution take 6 mls (300 milligram total) by mouth 3 (three) times daily.   glucose blood (ACCU-CHEK AVIVA PLUS) test strip use to check blood sugar 4 times daily.   Insulin  Pen Needle (GLOBAL EASE INJECT PEN NEEDLES) 31G X 8 MM MISC use 4 times daily.   insulin  regular human CONCENTRATED (HUMULIN  R U-500 KWIKPEN) 500 UNIT/ML KwikPen INJECT 100 UNITS INTO THE SKIN 3 TIMES DAILY WITH MEALS   ketoconazole  (NIZORAL ) 2 % cream Apply 1 Application topically daily.   ketoconazole  (NIZORAL ) 2 % cream Apply 1 Application topically daily.   metFORMIN  (GLUCOPHAGE ) 500 MG tablet take 2 tablets by mouth twice daily.   Multiple Vitamin (MULTIVITAMIN WITH MINERALS) TABS tablet Take 1 tablet by mouth daily.   Oxycodone  HCl 10 MG TABS Take 10 mg by mouth 4 (four) times daily as needed.   pantoprazole  (PROTONIX ) 40 MG tablet take 1 tablet daily, 30 minutes before breakfast `   pravastatin  (PRAVACHOL ) 40 MG tablet Take 40 mg by mouth daily.   sacubitril -valsartan  (ENTRESTO ) 49-51 MG Take 1 tablet by mouth 2 (two) times daily.   silver sulfADIAZINE (SILVADENE) 1 % cream Apply 1 application  topically daily.   spironolactone  (ALDACTONE ) 25 MG tablet take 1 tablet once daily.   torsemide  (DEMADEX ) 10 MG tablet take 1 tablet by mouth once daily.   verapamil  (CALAN -SR) 120 MG CR tablet Take 1 tablet (120 mg total) by mouth daily.   Vitamin D , Ergocalciferol , (DRISDOL ) 1.25 MG (50000 UNIT) CAPS capsule take 1 capsule by mouth once a week.   [DISCONTINUED] insulin  regular human CONCENTRATED (HUMULIN  R U-500 KWIKPEN) 500 UNIT/ML KwikPen INJECT 90-100 UNITS INTO THE SKIN 3 TIMES DAILY WITH MEALS   No facility-administered encounter medications  on file as of 05/17/2023.   ALLERGIES: Allergies  Allergen Reactions   Biaxin [Clarithromycin] Anaphylaxis and Shortness Of Breath   Ace Inhibitors     D/c ACEi 05/20/2019 > changed to clonidine  for pain control component   Although even in retrospect it may not be clear the ACEi contributed to the pt's symptoms, adding them back at this point or in the future would risk confusion in interpretation of non-specific respiratory symptoms to which this patient is prone ie Better not to muddy the waters here.     Farxiga  [Dapagliflozin ] Other (  See Comments)   VACCINATION STATUS: Immunization History  Administered Date(s) Administered   Influenza Inj Mdck Quad Pf 10/31/2019, 11/12/2020   Influenza-Unspecified 10/18/2010, 10/18/2011, 10/18/2012, 12/11/2013, 09/11/2014, 10/13/2015, 09/06/2018   Moderna Sars-Covid-2 Vaccination 04/01/2019, 04/29/2019   PNEUMOCOCCAL CONJUGATE-20 05/08/2020   Tdap 04/13/2016    Diabetes He presents for his follow-up diabetic visit. He has type 2 diabetes mellitus. Onset time: He was diagnosed at approximate age of 50 years. His disease course has been worsening. There are no hypoglycemic associated symptoms. Pertinent negatives for hypoglycemia include no confusion, headaches, pallor or seizures. Associated symptoms include polydipsia and polyuria. Pertinent negatives for diabetes include no chest pain, no fatigue, no polyphagia and no weakness. There are no hypoglycemic complications. Symptoms are worsening. Diabetic complications include PVD. Risk factors for coronary artery disease include dyslipidemia, diabetes mellitus, family history, male sex, obesity and sedentary lifestyle. Current diabetic treatment includes intensive insulin  program. He is compliant with treatment most of the time. His weight is increasing steadily. He is following a generally unhealthy diet. When asked about meal planning, he reported none. He has had a previous visit with a dietitian. He never  participates in exercise. His home blood glucose trend is increasing steadily. His breakfast blood glucose range is generally 180-200 mg/dl. His lunch blood glucose range is generally 180-200 mg/dl. His dinner blood glucose range is generally 180-200 mg/dl. His bedtime blood glucose range is generally 180-200 mg/dl. His overall blood glucose range is 180-200 mg/dl. Schipp presents with his meter and logs.  He presents with increasing glycemic profile and previsit labs showing A1c of 8%.   He did not document any hypoglycemia.  He did not tolerate the Ozempic  tried during his last visit.  He is asking for oral alternative for GLP-1 receptor agonists. ) An ACE inhibitor/angiotensin II receptor blocker is being taken.  Hyperlipidemia This is a chronic problem. The current episode started more than 1 year ago. The problem is uncontrolled. Exacerbating diseases include diabetes and obesity. Associated symptoms include shortness of breath. Pertinent negatives include no chest pain or myalgias. Current antihyperlipidemic treatment includes statins. Risk factors for coronary artery disease include diabetes mellitus, dyslipidemia, hypertension, male sex, obesity and a sedentary lifestyle.  Hypertension This is a chronic problem. The current episode started more than 1 year ago. The problem is uncontrolled. Associated symptoms include shortness of breath. Pertinent negatives include no chest pain, headaches, neck pain or palpitations. Risk factors for coronary artery disease include diabetes mellitus, dyslipidemia, obesity, male gender, sedentary lifestyle and family history. Past treatments include ACE inhibitors and calcium channel blockers. The current treatment provides no improvement. Compliance problems include diet.  Hypertensive end-organ damage includes PVD.     Review of systems  Limited as above.   Objective:    BP (!) 126/54   Pulse 68   Ht 5\' 9"  (1.753 m)   Wt (!) 456 lb 6.4 oz (207 kg)   BMI  67.40 kg/m   Wt Readings from Last 3 Encounters:  05/17/23 (!) 456 lb 6.4 oz (207 kg)  12/22/22 (!) 452 lb 3.2 oz (205.1 kg)  11/16/22 (!) 440 lb (199.6 kg)     Physical Exam- Limited  Edematous lower extremities. Calluses, poor circulation on bilateral lower extremities.  Diminished monofilament test. Results for orders placed or performed in visit on 05/17/23  HgB A1c   Collection Time: 05/17/23 10:08 AM  Result Value Ref Range   Hemoglobin A1C     HbA1c POC (<> result, manual entry)  HbA1c, POC (prediabetic range)     HbA1c, POC (controlled diabetic range) 8.0 (A) 0.0 - 7.0 %   Diabetic Labs (most recent): Lab Results  Component Value Date   HGBA1C 8.0 (A) 05/17/2023   HGBA1C 7.0 08/22/2022   HGBA1C 7.2 (A) 04/20/2022   MICROALBUR 0.8 06/11/2018   MICROALBUR 5.3 10/15/2016   MICROALBUR 1.4 03/12/2015   Lipid Panel     Component Value Date/Time   CHOL 134 08/15/2022 0838   TRIG 108 08/15/2022 0838   HDL 43 08/15/2022 0838   CHOLHDL 3.1 08/15/2022 0838   CHOLHDL 3.8 04/01/2019 0843   VLDL 31 (H) 03/12/2015 1200   LDLCALC 71 08/15/2022 0838   LDLCALC 79 04/01/2019 0843   LDLDIRECT 90 08/15/2022 0832      Latest Ref Rng & Units 11/16/2022    9:31 AM 08/15/2022    8:38 AM 08/15/2022    8:32 AM  CMP  Glucose 70 - 99 mg/dL 161  096  96   BUN 6 - 20 mg/dL 10  11  15    Creatinine 0.61 - 1.24 mg/dL 0.45  4.09  8.11   Sodium 135 - 145 mmol/L 138  140  142   Potassium 3.5 - 5.1 mmol/L 4.4  4.8  3.9   Chloride 98 - 111 mmol/L 104  99  102   CO2 22 - 32 mmol/L 23  26  22    Calcium 8.9 - 10.3 mg/dL 9.1  91.4  9.0   Total Protein 6.0 - 8.5 g/dL  6.5  6.7   Total Bilirubin 0.0 - 1.2 mg/dL  0.5  0.5   Alkaline Phos 44 - 121 IU/L  73  79   AST 0 - 40 IU/L  28  25   ALT 0 - 44 IU/L  24  21      Assessment & Plan:   1. Uncontrolled type 2 diabetes mellitus with diabetic peripheral angiopathy without gangrene, with long-term current use of insulin  (HCC)  An  presents with his meter and logs.  He presents with increasing glycemic profile and previsit labs showing A1c of 8%.   He did not document any hypoglycemia.  He did not tolerate the Ozempic  tried during his last visit.  He is asking for oral alternative for GLP-1 receptor agonists.  He remains at extremely high risk for more acute and chronic complications of diabetes which include CAD, CVA, CKD, retinopathy, and neuropathy. These are all discussed in detail with the patient.   Glucose logs and insulin  administration records pertaining to this visit,  to be scanned into patient's records.  Recent labs reviewed.  - he acknowledges that there is a room for improvement in his food and drink choices. - Suggestion is made for him to avoid simple carbohydrates  from his diet including Cakes, Sweet Desserts, Ice Cream, Soda (diet and regular), Sweet Tea, Candies, Chips, Cookies, Store Bought Juices, Alcohol , Artificial Sweeteners,  Coffee Creamer, and "Sugar-free" Products, Lemonade. This will help patient to have more stable blood glucose profile and potentially avoid unintended weight gain.   - Patient is advised to stick to a routine mealtimes to eat 3 meals  a day and avoid unnecessary snacks ( to snack only to correct hypoglycemia).   -He did not make major changes in the recommended Plant predominant Whole Foods lifestyle nutrition. -He did not tolerate the repeat attempt in Ozempic  which he stopped after a month.   - In light of his request for oral form  of GLP-1 receptor agonist, I discussed and added Rybelsus  3 mg p.o. daily before breakfast.  This medication will be advanced as he tolerates.  -For now, he is advised to continue Humulin  U500  100 units with breakfast, 100 units with lunch, 100 units with supper  for pre-meal blood glucose readings of 90 or above milligrams per deciliter associated with strict monitoring of blood glucose 4 times a day-before meals and at bedtime.    -Patient is  encouraged to call clinic for blood glucose levels less than 70 or above 300 mg /dl. -He is advised to continue Metformin  1000 mg p.o. twice daily-with breakfast and supper.    -He did not tolerate SGLT2 receptor inhibitors.    -He is following with Melva Stabile, CDE for DM education.  - Patient specific target  for A1c; LDL, HDL, Triglycerides,  were discussed in detail.  2) BP/HTN: His blood pressure is controlled to target.  Patient admits he has not taken his blood pressure medications this morning.    He is advised to proceed with his benazepril  to 10 mg p.o. daily, also has verapamil  240 mg p.o. daily for blood pressure treatment.   3) Lipids/HPL: He has uncontrolled LDL, however improving LDL at 71 improving.  He is advised to continue lovastatin 40 mg p.o. nightly.     Side effects and precautions discussed with him.     4)  Weight/Diet: His BMI is 67.40--clearly complicating his diabetes care.  He is a candidate for major weight loss.   He has had no significant success in weight control.  He still admits to dietary indiscretion.   CDE consult in progress, exercise, and carbohydrates information provided.  He hesitates to consider bariatric surgery. He has gotten a pair of diabetes shoes.   5) Chronic Care/Health Maintenance:  -Patient  is  on ACEI/ARB and Statin medications and encouraged to continue to follow up with Ophthalmology, Podiatrist at least yearly or according to recommendations, and advised to  stay away from smoking. I have recommended yearly flu vaccine and pneumonia vaccination at least every 5 years; and  sleep for at least 7 hours a day. -This patient cannot exercise optimally due to his heavy weight and comorbidities.  - I advised patient to maintain close follow up with Pcp, No for primary care needs.    I spent  42  minutes in the care of the patient today including review of labs from CMP, Lipids, Thyroid Function, Hematology (current and previous  including abstractions from other facilities); face-to-face time discussing  his blood glucose readings/logs, discussing hypoglycemia and hyperglycemia episodes and symptoms, medications doses, his options of short and long term treatment based on the latest standards of care / guidelines;  discussion about incorporating lifestyle medicine;  and documenting the encounter. Risk reduction counseling performed per USPSTF guidelines to reduce  obesity and cardiovascular risk factors.     Please refer to Patient Instructions for Blood Glucose Monitoring and Insulin /Medications Dosing Guide"  in media tab for additional information. Please  also refer to " Patient Self Inventory" in the Media  tab for reviewed elements of pertinent patient history.  Aaron Potter participated in the discussions, expressed understanding, and voiced agreement with the above plans.  All questions were answered to his satisfaction. he is encouraged to contact clinic should he have any questions or concerns prior to his return visit.   Follow up plan: -Return in about 4 months (around 09/17/2023) for F/U with Pre-visit Labs, Meter/CGM/Logs, A1c here.  Kalvin Orf, MD Phone: [phone]  Fax: [phone]  -  This note was partially dictated with voice recognition software. Similar sounding words can be transcribed inadequately or may not  be corrected upon review.  05/17/2023, 2:02 PM

## 2023-05-23 ENCOUNTER — Telehealth: Payer: Self-pay

## 2023-05-23 ENCOUNTER — Telehealth: Payer: Self-pay | Admitting: Pulmonary Disease

## 2023-05-23 NOTE — Telephone Encounter (Signed)
 Called and spoke with patient to schedule his overdue follow up for CPAP compliance.  Patient did not wish to be scheduled in [facility] and will wait for an appointment here in the [place] location.  He states he found a mask yesterday and "cleaned it p" and he will use it until he can be seen.  Informed patient I will call him when I have an opening.  He voiced his understanding

## 2023-05-23 NOTE — Telephone Encounter (Signed)
 Pharmacy Patient Advocate Encounter   Received notification from CoverMyMeds that prior authorization for Rybelsus  is required/requested.   Insurance verification completed.   The patient is insured through [facility] .   Per test claim: PA required and submitted KEY/EOC/Request #: [id number] due to the medication is not covered by the plan

## 2023-05-24 NOTE — Telephone Encounter (Signed)
 Pt made aware

## 2023-05-24 NOTE — Telephone Encounter (Signed)
 PA Team states Rybelsus  is not covered by pt's plan.

## 2023-05-29 ENCOUNTER — Other Ambulatory Visit: Payer: Self-pay | Admitting: Podiatry

## 2023-05-30 ENCOUNTER — Other Ambulatory Visit: Payer: Self-pay | Admitting: "Endocrinology

## 2023-05-30 ENCOUNTER — Other Ambulatory Visit: Payer: Self-pay | Admitting: Podiatry

## 2023-06-20 ENCOUNTER — Other Ambulatory Visit: Payer: Self-pay | Admitting: "Endocrinology

## 2023-06-20 ENCOUNTER — Other Ambulatory Visit: Payer: Self-pay | Admitting: Gastroenterology

## 2023-06-20 ENCOUNTER — Other Ambulatory Visit: Payer: Self-pay | Admitting: Podiatry

## 2023-06-29 ENCOUNTER — Ambulatory Visit (INDEPENDENT_AMBULATORY_CARE_PROVIDER_SITE_OTHER)

## 2023-06-29 ENCOUNTER — Other Ambulatory Visit: Payer: Self-pay | Admitting: "Endocrinology

## 2023-06-29 ENCOUNTER — Ambulatory Visit: Admitting: Podiatry

## 2023-06-29 DIAGNOSIS — M2041 Other hammer toe(s) (acquired), right foot: Secondary | ICD-10-CM | POA: Diagnosis not present

## 2023-06-29 DIAGNOSIS — E1149 Type 2 diabetes mellitus with other diabetic neurological complication: Secondary | ICD-10-CM | POA: Diagnosis not present

## 2023-06-29 DIAGNOSIS — M7752 Other enthesopathy of left foot: Secondary | ICD-10-CM | POA: Diagnosis not present

## 2023-06-29 DIAGNOSIS — M2042 Other hammer toe(s) (acquired), left foot: Secondary | ICD-10-CM

## 2023-07-03 NOTE — Progress Notes (Signed)
 Subjective: Chief Complaint  Patient presents with   Foot Pain    RM#14 Left foot pain patient states needs callus trim on both feet.     63 y.o. male presents the office with above concerns.  He has discomfort to the bottoms of the heels around where he gets calluses.  Does not report any injuries or falls or any open lesions.   He has been getting pain to his left foot all the foot and toes.  He states this feels different than the normal of the.  He still taking gabapentin  for the feet.  Objective: AAO x3, NAD DP/PT pulses palpable bilaterally, CRT less than 3 seconds Sensation decreased to [facility] monofilament.  Tenderness palpation to bilateral heels and this is an area of thick hyperkeratotic tissue.  There is no underlying ulceration, drainage or any signs of infection noted today.  There is no open lesions identified bilaterally. Hammertoes present. The metatarsal heads plantarly.  He gets tenderness along the MTPJ's but there is no specific area pinpoint tenderness. No pain with calf compression, swelling, warmth, erythema  Assessment: Neuropathy, dry skin/callus; capsulitis  Plan: Neuropathy -Continue gabapentin  for neuropathy  Hyperkeratotic lesions -Debrided hyperkeratotic lesion x 2 to bilateral heels without any complications or bleeding as a courtesy.  Continue moisturizer, offloading.  Foot pain -Dispensed offloading pads.  He can use topical medication such as Voltaren  gel as well. Diabetic shoes/insoles would be beneficial.

## 2023-07-13 ENCOUNTER — Telehealth: Payer: Self-pay | Admitting: Podiatry

## 2023-07-13 NOTE — Telephone Encounter (Signed)
 Patient called in regards to a referral for DM shoes. States he still has not heard anything from the facility he was referred over too. Last Dm shoe referral I see in chart is from last August. Please advise, thanks

## 2023-07-14 ENCOUNTER — Other Ambulatory Visit: Payer: Self-pay | Admitting: Podiatry

## 2023-07-14 DIAGNOSIS — L84 Corns and callosities: Secondary | ICD-10-CM

## 2023-07-14 DIAGNOSIS — M2041 Other hammer toe(s) (acquired), right foot: Secondary | ICD-10-CM

## 2023-07-14 DIAGNOSIS — E1149 Type 2 diabetes mellitus with other diabetic neurological complication: Secondary | ICD-10-CM

## 2023-07-14 NOTE — Telephone Encounter (Signed)
 Referral faxed 07/14/2023 [email or web address]

## 2023-07-28 ENCOUNTER — Other Ambulatory Visit: Payer: Self-pay | Admitting: Podiatry

## 2023-07-28 ENCOUNTER — Other Ambulatory Visit: Payer: Self-pay | Admitting: "Endocrinology

## 2023-08-21 ENCOUNTER — Telehealth: Payer: Self-pay

## 2023-08-21 NOTE — Telephone Encounter (Addendum)
 Received CMN from [facility] for CPAP supplies.  Placed on Tammy Parrett's desk in C POD for signature.  Will mail back to [place] in envelope provided.   [address] [place], [place]  [id number]  Received CMN signed.  Mailed form back to address listed above.

## 2023-08-21 NOTE — Telephone Encounter (Signed)
 This encounter was created in error - please disregard.

## 2023-09-13 LAB — COMPREHENSIVE METABOLIC PANEL WITH GFR
ALT: 28 IU/L (ref 0–44)
AST: 48 IU/L — ABNORMAL HIGH (ref 0–40)
Albumin: 4 g/dL (ref 3.8–4.9)
Alkaline Phosphatase: 81 IU/L (ref 44–121)
BUN/Creatinine Ratio: 12 (ref 10–24)
BUN: 12 mg/dL (ref 8–27)
Bilirubin Total: 0.6 mg/dL (ref 0.0–1.2)
CO2: 21 mmol/L (ref 20–29)
Calcium: 9.6 mg/dL (ref 8.6–10.2)
Chloride: 100 mmol/L (ref 96–106)
Creatinine, Ser: 1.02 mg/dL (ref 0.76–1.27)
Globulin, Total: 2.7 g/dL (ref 1.5–4.5)
Glucose: 133 mg/dL — ABNORMAL HIGH (ref 70–99)
Sodium: 139 mmol/L (ref 134–144)
Total Protein: 6.7 g/dL (ref 6.0–8.5)
eGFR: 84 mL/min/1.73 (ref >59)

## 2023-09-13 LAB — LIPID PANEL
Chol/HDL Ratio: 3.2 ratio (ref 0.0–5.0)
Cholesterol, Total: 131 mg/dL (ref 100–199)
HDL: 41 mg/dL (ref >39)
LDL Chol Calc (NIH): 73 mg/dL (ref 0–99)
Triglycerides: 91 mg/dL (ref 0–149)
VLDL Cholesterol Cal: 17 mg/dL (ref 5–40)

## 2023-09-13 LAB — T4, FREE: Free T4: 1.08 ng/dL (ref 0.82–1.77)

## 2023-09-13 LAB — TSH: TSH: 2.19 u[IU]/mL (ref 0.450–4.500)

## 2023-09-17 ENCOUNTER — Other Ambulatory Visit: Payer: Self-pay | Admitting: "Endocrinology

## 2023-09-17 ENCOUNTER — Other Ambulatory Visit: Payer: Self-pay | Admitting: Podiatry

## 2023-09-19 ENCOUNTER — Encounter: Payer: Self-pay | Admitting: "Endocrinology

## 2023-09-19 ENCOUNTER — Ambulatory Visit: Admitting: "Endocrinology

## 2023-09-19 VITALS — BP 102/44 | HR 68 | Ht 69.0 in | Wt >= 6400 oz

## 2023-09-19 DIAGNOSIS — Z794 Long term (current) use of insulin: Secondary | ICD-10-CM | POA: Diagnosis not present

## 2023-09-19 DIAGNOSIS — E782 Mixed hyperlipidemia: Secondary | ICD-10-CM

## 2023-09-19 DIAGNOSIS — Z9189 Other specified personal risk factors, not elsewhere classified: Secondary | ICD-10-CM

## 2023-09-19 DIAGNOSIS — I10 Essential (primary) hypertension: Secondary | ICD-10-CM | POA: Diagnosis not present

## 2023-09-19 DIAGNOSIS — E1169 Type 2 diabetes mellitus with other specified complication: Secondary | ICD-10-CM | POA: Diagnosis not present

## 2023-09-19 LAB — POCT GLYCOSYLATED HEMOGLOBIN (HGB A1C): HbA1c, POC (controlled diabetic range): 7 % (ref 0.0–7.0)

## 2023-09-19 NOTE — Patient Instructions (Signed)

## 2023-09-19 NOTE — Progress Notes (Signed)
 09/19/2023  Endocrinology follow-up note  Subjective:    Patient ID: Aaron Potter, male    DOB: 06/20/1960, PCP Pcp, No   Past Medical History:  Diagnosis Date   Allergy    Anxiety disorder, unspecified    Asthma    Back pain    Balanitis    Cellulitis    Essential (primary) hypertension    Gastro-esophageal reflux disease with esophagitis    Gout    Lichen sclerosus    Mixed hyperlipidemia    Morbid (severe) obesity with alveolar hypoventilation (HCC)    Neuropathy    Obesity, morbid (more than 100 lbs over ideal weight or BMI > 40) (HCC)    Polyosteoarthritis, unspecified    Sleep apnea    Type 2 diabetes mellitus with hyperglycemia (HCC)    Urticaria    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    Past Surgical History:  Procedure Laterality Date   CHOLECYSTECTOMY     ERCP  1998   [facility] [place] [facility] s/p cholecystectomy   FOOT SURGERY Right 04/2017   TOOTH EXTRACTION  12/2022   Social History   Socioeconomic History   Marital status: Single    Spouse name: Not on file   Number of children: 0   Years of education: Not on file   Highest education level: Not on file  Occupational History   Not on file  Tobacco Use   Smoking status: Never   Smokeless tobacco: Never  Vaping Use   Vaping status: Never Used  Substance and Sexual Activity   Alcohol use: No    Alcohol/week: 0.0 standard drinks of alcohol   Drug use: No   Sexual activity: Yes  Other Topics Concern   Not on file  Social History Narrative   Not on file   Social Drivers of Health   Financial Resource Strain: Low Risk  (07/19/2023)   Received from [facility]   Overall Financial Resource Strain (CARDIA)    How hard is it for you to pay for the very basics like food, housing, medical care, and heating?: Not very hard  Food Insecurity: No Food Insecurity (01/17/2023)   Received from [facility]   Hunger Vital Sign    Within the past 12 months, you worried  that your food would run out before you got the money to buy more.: Never true    Within the past 12 months, the food you bought just didn't last and you didn't have money to get more.: Never true  Transportation Needs: No Transportation Needs (01/17/2023)   Received from [facility]   PRAPARE - Transportation    Lack of Transportation (Medical): No    Lack of Transportation (Non-Medical): No  Physical Activity: Sufficiently Active (07/19/2023)   Received from [facility]   Exercise Vital Sign    On average, how many days per week do you engage in moderate to strenuous exercise (like a brisk walk)?: 7 days    On average, how many minutes do you engage in exercise at this level?: 30 min  Stress: No Stress Concern Present (01/17/2023)   Received from [facility] - Occupational Stress Questionnaire    Feeling of Stress : Only a little  Social Connections: Moderately Isolated (01/17/2023)   Received from [facility]   Social Connection and Isolation Panel    In a typical week, how many times do you talk  on the phone with family, friends, or neighbors?: More than three times a week    How often do you get together with friends or relatives?: More than three times a week    How often do you attend church or religious services?: 1 to 4 times per year    Do you belong to any clubs or organizations such as church groups, unions, fraternal or athletic groups, or school groups?: No    How often do you attend meetings of the clubs or organizations you belong to?: Never    Are you married, widowed, divorced, separated, never married, or living with a partner?: Never married   Outpatient Encounter Medications as of 09/19/2023  Medication Sig   Accu-Chek FastClix Lancets MISC use to check blood sugar four times a day.   albuterol  (PROVENTIL ) (2.5 MG/3ML) 0.083% nebulizer solution Take 3 mLs (2.5 mg total) by nebulization every 6 (six) hours as  needed for wheezing or shortness of breath.   albuterol  (VENTOLIN  HFA) 108 (90 Base) MCG/ACT inhaler Inhale 1-2 puffs into the lungs every 6 (six) hours as needed for wheezing or shortness of breath.   allopurinol  (ZYLOPRIM ) 300 MG tablet Take 300 mg by mouth daily.     Blood Glucose Monitoring Suppl (ACCU-CHEK GUIDE) w/Device KIT 1 Piece by Does not apply route as directed.   bumetanide  (BUMEX ) 1 MG tablet Take 1 tablet (1 mg total) by mouth daily.   cetirizine (ZYRTEC) 10 MG tablet Take 10 mg by mouth daily.   chlorpheniramine-HYDROcodone  (TUSSIONEX) 10-8 MG/5ML Take 5 mLs by mouth every 12 (twelve) hours as needed for cough.   DULoxetine (CYMBALTA) 20 MG capsule Take 20 mg by mouth daily.   gabapentin  (NEURONTIN ) 250 MG/5ML solution take 6 mls (300 milligram total) by mouth 3 (three) times daily.   glucose blood (ACCU-CHEK AVIVA PLUS) test strip use to check blood sugar 4 times daily.   Insulin  Pen Needle (GLOBAL EASE INJECT PEN NEEDLES) 31G X 8 MM MISC use 4 times daily.   insulin  regular human CONCENTRATED (HUMULIN  R U-500 KWIKPEN) 500 UNIT/ML KwikPen INJECT 100 UNITS INTO THE SKIN 3 TIMES DAILY WITH MEALS   ketoconazole  (NIZORAL ) 2 % cream Apply 1 Application topically daily.   ketoconazole  (NIZORAL ) 2 % cream APPLY 1 APPLICATION TOPICALLY DAILY   metFORMIN  (GLUCOPHAGE ) 500 MG tablet take 2 tablets by mouth twice daily.   Multiple Vitamin (MULTIVITAMIN WITH MINERALS) TABS tablet Take 1 tablet by mouth daily.   Oxycodone  HCl 10 MG TABS Take 10 mg by mouth 4 (four) times daily as needed.   pantoprazole  (PROTONIX ) 40 MG tablet take 1 tablet daily, 30 minutes before breakfast `   pravastatin  (PRAVACHOL ) 40 MG tablet Take 40 mg by mouth daily.   sacubitril -valsartan  (ENTRESTO ) 49-51 MG Take 1 tablet by mouth 2 (two) times daily.   silver sulfADIAZINE (SILVADENE) 1 % cream Apply 1 application  topically daily.   spironolactone  (ALDACTONE ) 25 MG tablet take 1 tablet once daily.   torsemide   (DEMADEX ) 10 MG tablet take 1 tablet by mouth once daily.   verapamil  (CALAN -SR) 120 MG CR tablet Take 1 tablet (120 mg total) by mouth daily.   Vitamin D , Ergocalciferol , (DRISDOL ) 1.25 MG (50000 UNIT) CAPS capsule take 1 capsule by mouth once a week.   [DISCONTINUED] Semaglutide  (RYBELSUS ) 3 MG TABS Take 1 tablet (3 mg total) by mouth daily.   No facility-administered encounter medications on file as of 09/19/2023.   ALLERGIES: Allergies  Allergen Reactions   Biaxin [  Clarithromycin] Anaphylaxis and Shortness Of Breath   Ace Inhibitors     D/c ACEi 05/20/2019 > changed to clonidine  for pain control component   Although even in retrospect it may not be clear the ACEi contributed to the pt's symptoms, adding them back at this point or in the future would risk confusion in interpretation of non-specific respiratory symptoms to which this patient is prone ie Better not to muddy the waters here.     Farxiga  [Dapagliflozin ] Other (See Comments)   VACCINATION STATUS: Immunization History  Administered Date(s) Administered   Influenza Inj Mdck Quad Pf 10/31/2019, 11/12/2020   Influenza-Unspecified 10/18/2010, 10/18/2011, 10/18/2012, 12/11/2013, 09/11/2014, 10/13/2015, 09/06/2018   Moderna Sars-Covid-2 Vaccination 04/01/2019, 04/29/2019   PNEUMOCOCCAL CONJUGATE-20 05/08/2020   Tdap 04/13/2016    Diabetes He presents for his follow-up diabetic visit. He has type 2 diabetes mellitus. Onset time: He was diagnosed at approximate age of 89 years. His disease course has been improving. There are no hypoglycemic associated symptoms. Pertinent negatives for hypoglycemia include no confusion, headaches, pallor or seizures. Associated symptoms include polydipsia and polyuria. Pertinent negatives for diabetes include no chest pain, no fatigue, no polyphagia and no weakness. There are no hypoglycemic complications. Symptoms are improving. Diabetic complications include PVD. (Morbid obesity, comorbid  hyperlipidemia, hypertension, deconditioning.) Risk factors for coronary artery disease include dyslipidemia, diabetes mellitus, family history, male sex, obesity and sedentary lifestyle. Current diabetic treatment includes intensive insulin  program. He is compliant with treatment most of the time. His weight is increasing steadily. He is following a generally unhealthy diet. When asked about meal planning, he reported none. He has had a previous visit with a dietitian. He never participates in exercise. His home blood glucose trend is decreasing steadily. His breakfast blood glucose range is generally 140-180 mg/dl. His lunch blood glucose range is generally 140-180 mg/dl. His dinner blood glucose range is generally 140-180 mg/dl. His bedtime blood glucose range is generally 140-180 mg/dl. His overall blood glucose range is 140-180 mg/dl. Parker presents with his meter and logs.  He presents with improving glycemic profile towards target and a point-of-care A1c of 7% and overall improvement.  He did not document any hypoglycemia.  His insurance did not provide coverage for GLP-1 receptors injectable or oral options.    ) An ACE inhibitor/angiotensin II receptor blocker is being taken.  Hyperlipidemia This is a chronic problem. The current episode started more than 1 year ago. The problem is uncontrolled. Exacerbating diseases include diabetes and obesity. Associated symptoms include shortness of breath. Pertinent negatives include no chest pain or myalgias. Current antihyperlipidemic treatment includes statins. Risk factors for coronary artery disease include diabetes mellitus, dyslipidemia, hypertension, male sex, obesity and a sedentary lifestyle.  Hypertension This is a chronic problem. The current episode started more than 1 year ago. The problem is uncontrolled. Associated symptoms include shortness of breath. Pertinent negatives include no chest pain, headaches, neck pain or palpitations. Risk factors  for coronary artery disease include diabetes mellitus, dyslipidemia, obesity, male gender, sedentary lifestyle and family history. Past treatments include ACE inhibitors and calcium channel blockers. The current treatment provides no improvement. Compliance problems include diet.  Hypertensive end-organ damage includes PVD.     Review of systems  Limited as above.   Objective:    BP (!) 102/44   Pulse 68   Ht 5' 9 (1.753 m)   Wt (!) 457 lb 9.6 oz (207.6 kg)   BMI 67.58 kg/m   Wt Readings from Last 3 Encounters:  09/19/23 ROLLEN)  457 lb 9.6 oz (207.6 kg)  05/17/23 (!) 456 lb 6.4 oz (207 kg)  12/22/22 (!) 452 lb 3.2 oz (205.1 kg)     Physical Exam- Limited  Edematous lower extremities. Calluses, poor circulation on bilateral lower extremities.  Diminished monofilament test.   Results for orders placed or performed in visit on 09/19/23  HgB A1c   Collection Time: 09/19/23 10:39 AM  Result Value Ref Range   Hemoglobin A1C     HbA1c POC (<> result, manual entry)     HbA1c, POC (prediabetic range)     HbA1c, POC (controlled diabetic range) 7.0 0.0 - 7.0 %   Diabetic Labs (most recent): Lab Results  Component Value Date   HGBA1C 7.0 09/19/2023   HGBA1C 8.0 (A) 05/17/2023   HGBA1C 7.0 08/22/2022   MICROALBUR 0.8 06/11/2018   MICROALBUR 5.3 10/15/2016   MICROALBUR 1.4 03/12/2015   Lipid Panel     Component Value Date/Time   CHOL 131 09/12/2023 0851   TRIG 91 09/12/2023 0851   HDL 41 09/12/2023 0851   CHOLHDL 3.2 09/12/2023 0851   CHOLHDL 3.8 04/01/2019 0843   VLDL 31 (H) 03/12/2015 1200   LDLCALC 73 09/12/2023 0851   LDLCALC 79 04/01/2019 0843   LDLDIRECT 90 08/15/2022 0832      Latest Ref Rng & Units 09/12/2023    8:51 AM 11/16/2022    9:31 AM 08/15/2022    8:38 AM  CMP  Glucose 70 - 99 mg/dL 866  792  899   BUN 8 - 27 mg/dL 12  10  11    Creatinine 0.76 - 1.27 mg/dL 8.97  9.19  9.09   Sodium 134 - 144 mmol/L 139  138  140   Potassium mmol/L CANCELED  4.4  4.8    Chloride 96 - 106 mmol/L 100  104  99   CO2 20 - 29 mmol/L 21  23  26    Calcium 8.6 - 10.2 mg/dL 9.6  9.1  89.8   Total Protein 6.0 - 8.5 g/dL 6.7   6.5   Total Bilirubin 0.0 - 1.2 mg/dL 0.6   0.5   Alkaline Phos 44 - 121 IU/L 81   73   AST 0 - 40 IU/L 48   28   ALT 0 - 44 IU/L 28   24      Assessment & Plan:   1. Uncontrolled type 2 diabetes mellitus with diabetic peripheral angiopathy without gangrene, with long-term current use of insulin  (HCC)  Naim presents with his meter and logs.  He presents with improving glycemic profile towards target and a point-of-care A1c of 7% and overall improvement.  He did not document any hypoglycemia.  His insurance did not provide coverage for GLP-1 receptors injectable or oral options.    He remains at extremely high risk for more acute and chronic complications of diabetes which include CAD, CVA, CKD, retinopathy, and neuropathy. These are all discussed in detail with the patient.   Glucose logs and insulin  administration records pertaining to this visit,  to be scanned into patient's records.  Recent labs reviewed.  - he acknowledges that there is a room for improvement in his food and drink choices. - Suggestion is made for him to avoid simple carbohydrates  from his diet including Cakes, Sweet Desserts, Ice Cream, Soda (diet and regular), Sweet Tea, Candies, Chips, Cookies, Store Bought Juices, Alcohol , Artificial Sweeteners,  Coffee Creamer, and Sugar-free Products, Lemonade. This will help patient to have more stable  blood glucose profile and potentially avoid unintended weight gain.   - Patient is advised to stick to a routine mealtimes to eat 3 meals  a day and avoid unnecessary snacks ( to snack only to correct hypoglycemia).   -He did not make major changes in the recommended Plant predominant Whole Foods lifestyle nutrition. -He did not tolerate the repeat attempt in Ozempic  which he stopped after a month.  His insurance did not  provide coverage for Rybelsus .  -For now, he is advised to continue Humulin  U500  100 units with breakfast, 100 units with lunch, 100 units with supper  for pre-meal blood glucose readings of 90 or above milligrams per deciliter associated with strict monitoring of blood glucose 4 times a day-before meals and at bedtime.    -Patient is encouraged to call clinic for blood glucose levels less than 70 or above 300 mg /dl. -He is advised to continue Metformin  1000 mg p.o. twice daily-with breakfast and supper.    -He did not tolerate SGLT2 receptor inhibitors.    -He is following with Santana Duke, CDE for DM education.  - Patient specific target  for A1c; LDL, HDL, Triglycerides,  were discussed in detail.  2) BP/HTN: -His blood pressure is controlled.  Patient admits he has not taken his blood pressure medications this morning.    He is advised to proceed with his benazepril  to 10 mg p.o. daily, also has verapamil  240 mg p.o. daily for blood pressure treatment.   3) Lipids/HPL: He has uncontrolled LDL, however improving LDL at 73 improving.  He is advised to continue lovastatin 40 mg p.o. nightly.     Side effects and precautions discussed with him.     4)  Weight/Diet: His BMI is 67.58 kg/m---clearly complicating his diabetes care.  He is a candidate for major weight loss.   He has had no significant success in weight control.  He still admits to dietary indiscretion.   CDE consult in progress, exercise, and carbohydrates information provided.  He hesitates to consider bariatric surgery. He has gotten a pair of diabetes shoes.   5) Chronic Care/Health Maintenance:  -Patient  is  on ACEI/ARB and Statin medications and encouraged to continue to follow up with Ophthalmology, Podiatrist at least yearly or according to recommendations, and advised to  stay away from smoking. I have recommended yearly flu vaccine and pneumonia vaccination at least every 5 years; and  sleep for at least 7  hours a day. -This patient cannot exercise optimally due to his heavy weight and comorbidities.  He is working with medical orth[facility] and prost[facility] store in [place] where he was fit for a back brace which he says was helping him to ambulate better.  He is requesting for support letter for him to get this device.  I do not have experience with this particular brace, however patient believes that he would benefit him and I approved for him to try it.     - I advised patient to maintain close follow up with his PCP for primary care needs.    I spent  41  minutes in the care of the patient today including review of labs from CMP, Lipids, Thyroid Function, Hematology (current and previous including abstractions from other facilities); face-to-face time discussing  his blood glucose readings/logs, discussing hypoglycemia and hyperglycemia episodes and symptoms, medications doses, his options of short and long term treatment based on the latest standards of care / guidelines;  discussion about incorporating lifestyle medicine;  and documenting the encounter. Risk reduction counseling performed per USPSTF guidelines to reduce  obesity and cardiovascular risk factors.     Please refer to Patient Instructions for Blood Glucose Monitoring and Insulin /Medications Dosing Guide  in media tab for additional information. Please  also refer to  Patient Self Inventory in the Media  tab for reviewed elements of pertinent patient history.  Aaron Potter participated in the discussions, expressed understanding, and voiced agreement with the above plans.  All questions were answered to his satisfaction. he is encouraged to contact clinic should he have any questions or concerns prior to his return visit.    Follow up plan: -Return in about 4 months (around 01/19/2024) for Bring Meter/CGM Device/Logs- A1c in Office.  Ranny Earl, MD Phone: [phone]  Fax: [phone]  -  This note was partially dictated with  voice recognition software. Similar sounding words can be transcribed inadequately or may not  be corrected upon review.  09/19/2023, 12:11 PM

## 2023-09-25 ENCOUNTER — Encounter (HOSPITAL_BASED_OUTPATIENT_CLINIC_OR_DEPARTMENT_OTHER): Payer: Self-pay

## 2023-09-25 ENCOUNTER — Ambulatory Visit (HOSPITAL_BASED_OUTPATIENT_CLINIC_OR_DEPARTMENT_OTHER): Admitting: Pulmonary Disease

## 2023-09-25 ENCOUNTER — Encounter (HOSPITAL_BASED_OUTPATIENT_CLINIC_OR_DEPARTMENT_OTHER): Payer: Self-pay | Admitting: Pulmonary Disease

## 2023-09-25 VITALS — BP 117/39 | HR 62 | Ht 69.0 in | Wt >= 6400 oz

## 2023-09-25 NOTE — Progress Notes (Unsigned)
   Subjective:    Patient ID: Aaron Potter, male    DOB: 03/07/1960, 63 y.o.   MRN: [id number]  Pt left without being seen    Objective:   Physical Exam        Assessment & Plan:   Assessment and Plan Assessment & Plan

## 2023-09-29 ENCOUNTER — Ambulatory Visit: Admitting: Podiatry

## 2023-10-26 ENCOUNTER — Other Ambulatory Visit: Payer: Self-pay | Admitting: Podiatry

## 2023-11-03 ENCOUNTER — Other Ambulatory Visit: Payer: Self-pay | Admitting: "Endocrinology

## 2023-11-03 ENCOUNTER — Other Ambulatory Visit: Payer: Self-pay | Admitting: Podiatry

## 2023-11-03 DIAGNOSIS — Z9189 Other specified personal risk factors, not elsewhere classified: Secondary | ICD-10-CM

## 2023-11-07 ENCOUNTER — Other Ambulatory Visit: Payer: Self-pay | Admitting: Podiatry

## 2023-11-07 ENCOUNTER — Other Ambulatory Visit: Payer: Self-pay | Admitting: "Endocrinology

## 2023-12-07 ENCOUNTER — Other Ambulatory Visit: Payer: Self-pay | Admitting: Podiatry

## 2023-12-18 ENCOUNTER — Ambulatory Visit: Admitting: Podiatry

## 2023-12-18 DIAGNOSIS — M79674 Pain in right toe(s): Secondary | ICD-10-CM | POA: Diagnosis not present

## 2023-12-18 DIAGNOSIS — M79675 Pain in left toe(s): Secondary | ICD-10-CM | POA: Diagnosis not present

## 2023-12-18 DIAGNOSIS — B351 Tinea unguium: Secondary | ICD-10-CM | POA: Diagnosis not present

## 2023-12-18 DIAGNOSIS — E1149 Type 2 diabetes mellitus with other diabetic neurological complication: Secondary | ICD-10-CM | POA: Diagnosis not present

## 2023-12-18 DIAGNOSIS — L84 Corns and callosities: Secondary | ICD-10-CM

## 2023-12-18 MED ORDER — GABAPENTIN 250 MG/5ML PO SOLN: 300.0000 mg | Freq: Three times a day (TID) | ORAL | 0 refills | Status: AC

## 2023-12-18 MED ORDER — KETOCONAZOLE 2 % EX CREA: 1.0000 | TOPICAL_CREAM | Freq: Every day | CUTANEOUS | 2 refills | Status: AC

## 2023-12-18 NOTE — Progress Notes (Signed)
 Subjective: Chief Complaint  Patient presents with   Diabetes    Pt stated that he is here to have his feet examined     63 y.o. male presents the office with above concerns.  He has discomfort to the bottoms of the heels around where he gets calluses.  Does not report any injuries or falls or any open lesions.   Nails also again thick and elongated he cannot do them himself may cause discomfort.  Distal been taking the gabapentin  and tolerating it well.  Is prescribed 3 times a day but sometimes only takes it twice if he forgets.  No open lesions or any injuries or any other concerns today that he reports.  He needs a refill of the gabapentin .   Objective: AAO x3, NAD DP/PT pulses palpable bilaterally, CRT less than 3 seconds Sensation decreased to [facility] monofilament.  Tenderness palpation to bilateral heels and this is an area of thick hyperkeratotic tissue.  There is no underlying ulceration, drainage or any signs of infection noted today.  There is no open lesions identified bilaterally. Nails are hypertrophic, dystrophic, brittle, discolored, elongated 10. No surrounding redness or drainage. Tenderness nails 1-5 bilaterally.  Hammertoes present. The metatarsal heads plantarly.  He gets tenderness along the MTPJ's but there is no specific area pinpoint tenderness. No pain with calf compression, swelling, warmth, erythema  Assessment: Neuropathy, dry skin/callus; symptomatic onychomycosis  Plan: Neuropathy -Continue gabapentin  for neuropathy-refilled 90-day supply today - Daily foot inspection  Hyperkeratotic lesions -Debrided hyperkeratotic lesion x 2 to bilateral heels without any complications or bleeding as a courtesy.  Continue moisturizer, offloading.  Symptomatic onychomycosis - Sharply debrided nails x 10 without complications or bleeding.  Return in about 3 months (around 03/17/2024).  Donnice JONELLE Fees DPM

## 2023-12-18 NOTE — Patient Instructions (Signed)
 Gabapentin  Capsules or Tablets What is this medication? GABAPENTIN  (GAB a PEN tin) treats nerve pain. It may also be used to prevent and control seizures in people with epilepsy. It works by calming overactive nerves in your body. This medicine may be used for other purposes; ask your health care provider or pharmacist if you have questions. COMMON BRAND NAME(S): Active-PAC with Gabapentin , Gabarone , Gralise , Neurontin  What should I tell my care team before I take this medication? They need to know if you have any of these conditions: Have or have had depression Kidney disease Lung or breathing disease, such as asthma or COPD Substance use disorder Suicidal thoughts, plans, or attempt An unusual or allergic reaction to gabapentin , other medications, foods, dyes, or preservatives Pregnant or trying to get pregnant Breastfeeding How should I use this medication? Take this medication by mouth with a glass of water. Follow the directions on the prescription label. You can take it with or without food. If it upsets your stomach, take it with food. Take your medication at regular intervals. Do not take it more often than directed. Do not stop taking except on your care team's advice. If you are directed to break the 600 or 800 mg tablets in half as part of your dose, the extra half tablet should be used for the next dose. If you have not used the extra half tablet within 28 days, it should be thrown away. A special MedGuide will be given to you by the pharmacist with each prescription and refill. Be sure to read this information carefully each time. Talk to your care team about the use of this medication in children. While this medication may be prescribed for children as young as 3 years for selected conditions, precautions do apply. Overdosage: If you think you have taken too much of this medicine contact a poison control center or emergency room at once. NOTE: This medicine is only for you. Do not  share this medicine with others. What if I miss a dose? If you miss a dose, take it as soon as you can. If it is almost time for your next dose, take only that dose. Do not take double or extra doses. What may interact with this medication? Alcohol Antihistamines for allergy , cough, and cold Certain medications for anxiety or sleep Certain medications for depression like amitriptyline, fluoxetine, sertraline Certain medications for seizures like phenobarbital, primidone Certain medications for stomach problems General anesthetics like halothane, isoflurane, methoxyflurane, propofol  Local anesthetics like lidocaine , pramoxine, tetracaine Medications that relax muscles for surgery Opioid medications for pain Phenothiazines like chlorpromazine, mesoridazine, prochlorperazine, thioridazine This list may not describe all possible interactions. Give your health care provider a list of all the medicines, herbs, non-prescription drugs, or dietary supplements you use. Also tell them if you smoke, drink alcohol, or use illegal drugs. Some items may interact with your medicine. What should I watch for while using this medication? Visit your care team for regular checks on your progress. Tell your care team if your symptoms do not start to get better or if they get worse. Do not suddenly stop taking this medication. You may develop a severe reaction. Your care team will tell you how much medication to take. If your care team wants you to stop the medication, the dose may be slowly lowered over time to avoid any side effects. This medication may affect your coordination, reaction time, or judgment. Do not drive or operate machinery until you know how this medication affects you. Sit  up or stand slowly to reduce the risk of dizzy or fainting spells. Drinking alcohol with this medication can increase the risk of these side effects. Taking this medication with other CNS depressants can make you too sleepy. This  can make it hard to breathe and stay awake. In some cases, it can cause coma and death. CNS depressants include opioids, benzodiazepines, muscle relaxants, medications for sleep, and alcohol. Talk to your care team about all the medications, vitamins, and supplements you take. They can tell you what is safe to take together. Call emergency services right away if you have slow or shallow breathing, feel dizzy or confused, or have trouble staying awake. This medication can cause new or worsening depression and increase the risk of suicidal thoughts and actions in a small number of people. This can happen while you are taking this medication or after stopping it. Talk to your care team right away if you have changes in mood or behavior or thoughts of self-harm or suicide. They can help you. This medication may cause serious skin reactions. They can happen weeks to months after starting the medication. Talk to your care team right away if you have fevers or flu-like symptoms with a rash. The rash may be red or purple and then turn into blisters or peeling of the skin. Or you might notice a red rash with swelling of the face, lips, or lymph nodes in your neck or under your arms. If you take this medication for seizures, wear a medical ID bracelet or chain. Carry a card that describes your condition. List the medications and doses you take on the card. Talk to your care team if you may be pregnant. There are benefits and risks to taking medications during pregnancy. Your care team can help you find the option that works for you. What side effects may I notice from receiving this medication? Side effects that you should report to your care team as soon as possible: Allergic reactions or angioedema--skin rash, itching or hives, swelling of the face, eyes, lips, tongue, arms, or legs, trouble swallowing or breathing Rash, fever, and swollen lymph nodes Thoughts of suicide or self-harm, worsening mood, feelings of  depression Trouble breathing Unusual changes in mood or behavior in children after use such as trouble concentrating, hostility, or restlessness Side effects that usually do not require medical attention (report these to your care team if they continue or are bothersome): Dizziness Drowsiness Nausea Swelling of the ankles, hands, or feet Vomiting This list may not describe all possible side effects. Call your doctor for medical advice about side effects. You may report side effects to FDA at 1-800-FDA-1088. Where should I keep my medication? Keep out of reach of children and pets. Store at room temperature between 15 and 30 degrees C (59 and 86 degrees F). Get rid of any unused medication after the expiration date. This medication may cause accidental overdose and death if taken by other adults, children, or pets. To get rid of medications that are no longer needed or have expired: Take the medication to a medication take-back program. Check with your pharmacy or law enforcement to find a location. If you cannot return the medication, check the label or package insert to see if the medication should be thrown out in the garbage or flushed down the toilet. If you are not sure, ask your care team. If it is safe to put it in the trash, empty the medication out of the container. Mix the medication  with cat litter, dirt, coffee grounds, or other unwanted substance. Seal the mixture in a bag or container. Put it in the trash. NOTE: This sheet is a summary. It may not cover all possible information. If you have questions about this medicine, talk to your doctor, pharmacist, or health care provider.  2025 [employer] Standard (2023-05-08 00:00:00)

## 2023-12-25 ENCOUNTER — Other Ambulatory Visit: Payer: Self-pay | Admitting: Cardiology

## 2023-12-25 DIAGNOSIS — I5032 Chronic diastolic (congestive) heart failure: Secondary | ICD-10-CM

## 2023-12-26 ENCOUNTER — Other Ambulatory Visit: Payer: Self-pay | Admitting: "Endocrinology

## 2024-01-22 ENCOUNTER — Other Ambulatory Visit: Payer: Self-pay | Admitting: Podiatry

## 2024-01-24 ENCOUNTER — Encounter: Payer: Self-pay | Admitting: "Endocrinology

## 2024-01-24 ENCOUNTER — Ambulatory Visit: Admitting: "Endocrinology

## 2024-01-24 VITALS — BP 136/54 | HR 68 | Ht 69.0 in | Wt >= 6400 oz

## 2024-01-24 DIAGNOSIS — E1169 Type 2 diabetes mellitus with other specified complication: Secondary | ICD-10-CM | POA: Diagnosis not present

## 2024-01-24 DIAGNOSIS — E782 Mixed hyperlipidemia: Secondary | ICD-10-CM

## 2024-01-24 DIAGNOSIS — Z794 Long term (current) use of insulin: Secondary | ICD-10-CM

## 2024-01-24 DIAGNOSIS — I10 Essential (primary) hypertension: Secondary | ICD-10-CM | POA: Diagnosis not present

## 2024-01-24 LAB — POCT GLYCOSYLATED HEMOGLOBIN (HGB A1C): HbA1c, POC (controlled diabetic range): 7.7 % — AB (ref 0.0–7.0)

## 2024-01-24 NOTE — Patient Instructions (Signed)

## 2024-01-24 NOTE — Progress Notes (Signed)
 " 01/24/2024  Endocrinology follow-up note  Subjective:    Patient ID: Aaron Potter, male    DOB: 1960/09/15, PCP Pcp, No   Past Medical History:  Diagnosis Date   Allergy    Anxiety disorder, unspecified    Asthma    Back pain    Balanitis    Cellulitis    Essential (primary) hypertension    Gastro-esophageal reflux disease with esophagitis    Gout    Lichen sclerosus    Mixed hyperlipidemia    Morbid (severe) obesity with alveolar hypoventilation (HCC)    Neuropathy    Obesity, morbid (more than 100 lbs over ideal weight or BMI > 40) (HCC)    Polyosteoarthritis, unspecified    Sleep apnea    Type 2 diabetes mellitus with hyperglycemia (HCC)    Urticaria    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    Past Surgical History:  Procedure Laterality Date   CHOLECYSTECTOMY     ERCP  1998   [facility] stone s/p cholecystectomy   FOOT SURGERY Right 04/2017   TOOTH EXTRACTION  12/2022   Social History   Socioeconomic History   Marital status: Single    Spouse name: Not on file   Number of children: 0   Years of education: Not on file   Highest education level: Not on file  Occupational History   Not on file  Tobacco Use   Smoking status: Never   Smokeless tobacco: Never  Vaping Use   Vaping status: Never Used  Substance and Sexual Activity   Alcohol use: No    Alcohol/week: 0.0 standard drinks of alcohol   Drug use: No   Sexual activity: Yes  Other Topics Concern   Not on file  Social History Narrative   Not on file   Social Drivers of Health   Tobacco Use: Low Risk (01/24/2024)   Patient History    Smoking Tobacco Use: Never    Smokeless Tobacco Use: Never    Passive Exposure: Not on file  Financial Resource Strain: Low Risk (07/19/2023)   Received from [facility]   Overall Financial Resource Strain (CARDIA)    How hard is it for you to pay for the very basics like food, housing, medical care, and heating?: Not very  hard  Food Insecurity: No Food Insecurity (01/17/2023)   Received from [facility]   Epic    Within the past 12 months, you worried that your food would run out before you got the money to buy more.: Never true    Within the past 12 months, the food you bought just didn't last and you didn't have money to get more.: Never true  Transportation Needs: No Transportation Needs (01/17/2023)   Received from [facility]   PRAPARE - Transportation    Lack of Transportation (Medical): No    Lack of Transportation (Non-Medical): No  Physical Activity: Sufficiently Active (07/19/2023)   Received from [facility]   Exercise Vital Sign    On average, how many days per week do you engage in moderate to strenuous exercise (like a brisk walk)?: 7 days    On average, how many minutes do you engage in exercise at this level?: 30 min  Stress: No Stress Concern Present (01/17/2023)   Received from [facility] - Occupational Stress Questionnaire    Feeling of Stress : Only a little  Social Connections:  Moderately Isolated (01/17/2023)   Received from [facility]   Social Connection and Isolation Panel    In a typical week, how many times do you talk on the phone with family, friends, or neighbors?: More than three times a week    How often do you get together with friends or relatives?: More than three times a week    How often do you attend church or religious services?: 1 to 4 times per year    Do you belong to any clubs or organizations such as church groups, unions, fraternal or athletic groups, or school groups?: No    How often do you attend meetings of the clubs or organizations you belong to?: Never    Are you married, widowed, divorced, separated, never married, or living with a partner?: Never married  Depression (PHQ2-9): Not on file  Alcohol Screen: Not on file  Housing: Not on file  Utilities: Low Risk (01/17/2023)   Received from [facility]   Utilities    Within the past 12 months, have you been unable to get utilities(heat, electricity) when it was really needed?: No  Health Literacy: Medium Risk (01/17/2023)   Received from [facility] Literacy    How often do you need to have someone help you when you read instructions, pamphlets, or other written material from your doctor or pharmacy?: Sometimes   Outpatient Encounter Medications as of 01/24/2024  Medication Sig   Accu-Chek FastClix Lancets MISC use to check blood sugar four times a day.   albuterol  (PROVENTIL ) (2.5 MG/3ML) 0.083% nebulizer solution Take 3 mLs (2.5 mg total) by nebulization every 6 (six) hours as needed for wheezing or shortness of breath.   albuterol  (VENTOLIN  HFA) 108 (90 Base) MCG/ACT inhaler Inhale 1-2 puffs into the lungs every 6 (six) hours as needed for wheezing or shortness of breath.   allopurinol  (ZYLOPRIM ) 300 MG tablet Take 300 mg by mouth daily.     Blood Glucose Monitoring Suppl (ACCU-CHEK GUIDE) w/Device KIT 1 Piece by Does not apply route as directed.   bumetanide  (BUMEX ) 1 MG tablet Take 1 tablet (1 mg total) by mouth daily.   cetirizine (ZYRTEC) 10 MG tablet Take 10 mg by mouth daily.   chlorpheniramine-HYDROcodone  (TUSSIONEX) 10-8 MG/5ML Take 5 mLs by mouth every 12 (twelve) hours as needed for cough. (Patient not taking: Reported on 09/25/2023)   DULoxetine (CYMBALTA) 20 MG capsule Take 20 mg by mouth daily. (Patient not taking: Reported on 09/25/2023)   gabapentin  (NEURONTIN ) 250 MG/5ML solution Take 6 mLs (300 mg total) by mouth 3 (three) times daily.   glucose blood (ACCU-CHEK AVIVA PLUS) test strip use to check blood sugar 4 times daily.   Insulin  Pen Needle (GLOBAL EASE INJECT PEN NEEDLES) 31G X 8 MM MISC Use to inject insulin  three time daily as directed   insulin  regular human CONCENTRATED (HUMULIN  R U-500 KWIKPEN) 500 UNIT/ML KwikPen INJECT 100 UNITS INTO THE SKIN 3 TIMES DAILY WITH MEALS   ketoconazole   (NIZORAL ) 2 % cream Apply 1 Application topically daily.   ketoconazole  (NIZORAL ) 2 % cream APPLY 1 APPLICATION TOPICALLY DAILY   metFORMIN  (GLUCOPHAGE ) 500 MG tablet take 2 tablets by mouth twice daily.   Multiple Vitamin (MULTIVITAMIN WITH MINERALS) TABS tablet Take 1 tablet by mouth daily.   Oxycodone  HCl 10 MG TABS Take 10 mg by mouth 4 (four) times daily as needed.   pantoprazole  (PROTONIX ) 40 MG tablet take 1 tablet daily, 30 minutes  before breakfast `   pravastatin  (PRAVACHOL ) 40 MG tablet Take 40 mg by mouth daily.   sacubitril -valsartan  (ENTRESTO ) 49-51 MG Take 1 tablet by mouth 2 (two) times daily.   silver sulfADIAZINE (SILVADENE) 1 % cream Apply 1 application  topically daily.   spironolactone  (ALDACTONE ) 25 MG tablet take 1 tablet once daily.   torsemide  (DEMADEX ) 10 MG tablet take 1 tablet by mouth once daily.   verapamil  (CALAN -SR) 120 MG CR tablet Take 1 tablet (120 mg total) by mouth daily.   Vitamin D , Ergocalciferol , (DRISDOL ) 1.25 MG (50000 UNIT) CAPS capsule take 1 capsule by mouth once a week.   No facility-administered encounter medications on file as of 01/24/2024.   ALLERGIES: Allergies  Allergen Reactions   Biaxin [Clarithromycin] Anaphylaxis and Shortness Of Breath   Ace Inhibitors     D/c ACEi 05/20/2019 > changed to clonidine  for pain control component   Although even in retrospect it may not be clear the ACEi contributed to the pt's symptoms, adding them back at this point or in the future would risk confusion in interpretation of non-specific respiratory symptoms to which this patient is prone ie Better not to muddy the waters here.     Farxiga  [Dapagliflozin ] Other (See Comments)   VACCINATION STATUS: Immunization History  Administered Date(s) Administered   Influenza Inj Mdck Quad Pf 10/31/2019, 11/12/2020   Influenza-Unspecified 10/18/2010, 10/18/2011, 10/18/2012, 12/11/2013, 09/11/2014, 10/13/2015, 09/06/2018   Moderna Sars-Covid-2 Vaccination  04/01/2019, 04/29/2019   PNEUMOCOCCAL CONJUGATE-20 05/08/2020   Tdap 04/13/2016    Diabetes He presents for his follow-up diabetic visit. He has type 2 diabetes mellitus. Onset time: He was diagnosed at approximate age of 58 years. His disease course has been worsening. There are no hypoglycemic associated symptoms. Pertinent negatives for hypoglycemia include no confusion, headaches, pallor or seizures. Associated symptoms include polydipsia and polyuria. Pertinent negatives for diabetes include no chest pain, no fatigue, no polyphagia and no weakness. There are no hypoglycemic complications. Symptoms are worsening. Diabetic complications include PVD. (Morbid obesity, comorbid hyperlipidemia, hypertension, deconditioning.) Risk factors for coronary artery disease include dyslipidemia, diabetes mellitus, family history, male sex, obesity and sedentary lifestyle. Current diabetic treatment includes intensive insulin  program. He is compliant with treatment most of the time. His weight is increasing steadily. He is following a generally unhealthy diet. When asked about meal planning, he reported none. He has had a previous visit with a dietitian. He never participates in exercise. His home blood glucose trend is increasing steadily. His breakfast blood glucose range is generally 180-200 mg/dl. His lunch blood glucose range is generally 180-200 mg/dl. His dinner blood glucose range is generally 180-200 mg/dl. His bedtime blood glucose range is generally 180-200 mg/dl. His overall blood glucose range is 180-200 mg/dl. Aaron Potter presents with his meter and logs.  He  did have exposure to steroids in the interval which caused increased glycemic profile briefly.  He is point-of-care A1c 7.7% increasing from 7%.  His meter average is 204 mg per DL for the last 30 days.  He did not tolerate Ozempic , SGLT2 inhibitors.  His insurance did not provide coverage for oral GLP-1 receptor agonist.   ) An ACE  inhibitor/angiotensin II receptor blocker is being taken.  Hyperlipidemia This is a chronic problem. The current episode started more than 1 year ago. The problem is uncontrolled. Exacerbating diseases include diabetes and obesity. Associated symptoms include shortness of breath. Pertinent negatives include no chest pain or myalgias. Current antihyperlipidemic treatment includes statins. Risk factors for coronary artery  disease include diabetes mellitus, dyslipidemia, hypertension, male sex, obesity and a sedentary lifestyle.  Hypertension This is a chronic problem. The current episode started more than 1 year ago. The problem is uncontrolled. Associated symptoms include shortness of breath. Pertinent negatives include no chest pain, headaches, neck pain or palpitations. Risk factors for coronary artery disease include diabetes mellitus, dyslipidemia, obesity, male gender, sedentary lifestyle and family history. Past treatments include ACE inhibitors and calcium channel blockers. The current treatment provides no improvement. Compliance problems include diet.  Hypertensive end-organ damage includes PVD.     Review of systems  Limited as above.   Objective:    BP (!) 136/54   Pulse 68   Ht 5' 9 (1.753 m)   Wt (!) 462 lb 9.6 oz (209.8 kg)   BMI 68.31 kg/m   Wt Readings from Last 3 Encounters:  01/24/24 (!) 462 lb 9.6 oz (209.8 kg)  09/25/23 (!) 458 lb 12.8 oz (208.1 kg)  09/19/23 (!) 457 lb 9.6 oz (207.6 kg)     Physical Exam- Limited  Edematous lower extremities. Calluses, poor circulation on bilateral lower extremities.  Diminished monofilament test.   Results for orders placed or performed in visit on 01/24/24  HgB A1c   Collection Time: 01/24/24 10:43 AM  Result Value Ref Range   Hemoglobin A1C     HbA1c POC (<> result, manual entry)     HbA1c, POC (prediabetic range)     HbA1c, POC (controlled diabetic range) 7.7 (A) 0.0 - 7.0 %   Diabetic Labs (most recent): Lab  Results  Component Value Date   HGBA1C 7.7 (A) 01/24/2024   HGBA1C 7.0 09/19/2023   HGBA1C 8.0 (A) 05/17/2023   MICROALBUR 0.8 06/11/2018   MICROALBUR 5.3 10/15/2016   MICROALBUR 1.4 03/12/2015   Lipid Panel     Component Value Date/Time   CHOL 131 09/12/2023 0851   TRIG 91 09/12/2023 0851   HDL 41 09/12/2023 0851   CHOLHDL 3.2 09/12/2023 0851   CHOLHDL 3.8 04/01/2019 0843   VLDL 31 (H) 03/12/2015 1200   LDLCALC 73 09/12/2023 0851   LDLCALC 79 04/01/2019 0843   LDLDIRECT 90 08/15/2022 0832      Latest Ref Rng & Units 09/12/2023    8:51 AM 11/16/2022    9:31 AM 08/15/2022    8:38 AM  CMP  Glucose 70 - 99 mg/dL 866  792  899   BUN 8 - 27 mg/dL 12  10  11    Creatinine 0.76 - 1.27 mg/dL 8.97  9.19  9.09   Sodium 134 - 144 mmol/L 139  138  140   Potassium mmol/L CANCELED  4.4  4.8   Chloride 96 - 106 mmol/L 100  104  99   CO2 20 - 29 mmol/L 21  23  26    Calcium 8.6 - 10.2 mg/dL 9.6  9.1  89.8   Total Protein 6.0 - 8.5 g/dL 6.7   6.5   Total Bilirubin 0.0 - 1.2 mg/dL 0.6   0.5   Alkaline Phos 44 - 121 IU/L 81   73   AST 0 - 40 IU/L 48   28   ALT 0 - 44 IU/L 28   24      Assessment & Plan:   1. Uncontrolled type 2 diabetes mellitus with diabetic peripheral angiopathy without gangrene, with long-term current use of insulin  (HCC)  Aaron Potter presents with his meter and logs.  He  did have exposure to steroids in the interval which  caused increased glycemic profile briefly.  He is point-of-care A1c 7.7% increasing from 7%.  His meter average is 204 mg per DL for the last 30 days.  He did not tolerate Ozempic , SGLT2 inhibitors.  His insurance did not provide coverage for oral GLP-1 receptor agonist.  He remains at extremely high risk for more acute and chronic complications of diabetes which include CAD, CVA, CKD, retinopathy, and neuropathy. These are all discussed in detail with the patient.   Glucose logs and insulin  administration records pertaining to this visit,  to be scanned  into patient's records.  Recent labs reviewed.  - he acknowledges that there is a room for improvement in his food and drink choices. - Suggestion is made for him to avoid simple carbohydrates  from his diet including Cakes, Sweet Desserts, Ice Cream, Soda (diet and regular), Sweet Tea, Candies, Chips, Cookies, Store Bought Juices, Alcohol , Artificial Sweeteners,  Coffee Creamer, and Sugar-free Products, Lemonade. This will help patient to have more stable blood glucose profile and potentially avoid unintended weight gain.   - Patient is advised to stick to a routine mealtimes to eat 3 meals  a day and avoid unnecessary snacks ( to snack only to correct hypoglycemia).   -He did not engage with lifestyle medicine recommendations discussed with him. -He did not tolerate the repeat attempt in Ozempic  which he stopped after a month.  His insurance did not provide coverage for Rybelsus .  -For now, he is advised to continue Humulin  U500 100 units with breakfast 100 units with lunch and 100 units with supper  for pre-meal blood glucose readings of 90 or above milligrams per deciliter associated with strict monitoring of blood glucose 4 times a day-before meals and at bedtime.    -Patient is encouraged to call clinic for blood glucose levels less than 70 or above 300 mg /dl. -He is advised to continue Metformin  1000 mg p.o. twice daily-with breakfast and supper.    -He did not tolerate SGLT2 receptor inhibitors.    -He is following with Santana Duke, CDE for DM education.  - Patient specific target  for A1c; LDL, HDL, Triglycerides,  were discussed in detail.  2) BP/HTN: - His blood pressure is controlled to target.  Patient admits he has not taken his blood pressure medications this morning.    He is advised to proceed with his benazepril  to 10 mg p.o. daily, also has verapamil  240 mg p.o. daily for blood pressure treatment.   3) Lipids/HPL: He has uncontrolled LDL, however improving LDL at  73 improving.  He is advised to continue lovastatin 40 mg p.o. nightly.  Side effects and precautions discussed with him.     4)  Weight/Diet: His BMI is 68.31 kg/m--clearly complicating his diabetes care.  He is a candidate for major weight loss.   He has had no significant success in weight control.  He still admits to dietary indiscretion.   CDE consult in progress, exercise, and carbohydrates information provided.  He hesitates to consider bariatric surgery. He has gotten a pair of diabetes shoes.   5) Chronic Care/Health Maintenance:  -Patient  is  on ACEI/ARB and Statin medications and encouraged to continue to follow up with Ophthalmology, Podiatrist at least yearly or according to recommendations, and advised to  stay away from smoking. I have recommended yearly flu vaccine and pneumonia vaccination at least every 5 years; and  sleep for at least 7 hours a day. -This patient cannot exercise optimally due to his  heavy weight and comorbidities.  He is working with medical orth[facility] and prosthetics store in [place] where he was fit for a back brace which he says was helping him to ambulate better.  He is requesting for support letter for him to get this device.  I do not have experience with this particular brace, however patient believes that he would benefit him and I approved for him to try it.     - I advised patient to maintain close follow up with his PCP for primary care needs.    I spent  26  minutes in the care of the patient today including review of labs from CMP, Lipids, Thyroid Function, Hematology (current and previous including abstractions from other facilities); face-to-face time discussing  his blood glucose readings/logs, discussing hypoglycemia and hyperglycemia episodes and symptoms, medications doses, his options of short and long term treatment based on the latest standards of care / guidelines;  discussion about incorporating lifestyle medicine;  and documenting the  encounter. Risk reduction counseling performed per USPSTF guidelines to reduce obesity and cardiovascular risk factors.     Please refer to Patient Instructions for Blood Glucose Monitoring and Insulin /Medications Dosing Guide  in media tab for additional information. Please  also refer to  Patient Self Inventory in the Media  tab for reviewed elements of pertinent patient history.  Aaron Potter participated in the discussions, expressed understanding, and voiced agreement with the above plans.  All questions were answered to his satisfaction. he is encouraged to contact clinic should he have any questions or concerns prior to his return visit.  Dear Patient: Feel free to review your progress notes.  If you are reviewing this progress note and have questions about the meaning of /or medical terms being used, please make a note and address it at your next follow-up appointment.  Medical notes are meant to be a communication tool between medical professionals and require medical terms to be used for efficiency and insurance approval.    Follow up plan: -Return in about 4 months (around 05/23/2024) for F/U with Pre-visit Labs, Meter/CGM/Logs, A1c here.  Aaron Earl, MD Phone: [phone]  Fax: [phone]  -  This note was partially dictated with voice recognition software. Similar sounding words can be transcribed inadequately or may not  be corrected upon review.  01/24/2024, 1:35 PM "

## 2024-03-18 ENCOUNTER — Ambulatory Visit: Admitting: Podiatry

## 2024-05-24 ENCOUNTER — Ambulatory Visit: Admitting: "Endocrinology
